# Patient Record
Sex: Male | Born: 1950 | Race: Black or African American | Hispanic: No | Marital: Single | Smoking: Current some day smoker
Health system: Southern US, Community
[De-identification: ages and names within clinical notes are randomized; demographics above are authoritative.]

## PROBLEM LIST (undated history)

## (undated) DIAGNOSIS — E785 Hyperlipidemia, unspecified: Secondary | ICD-10-CM

## (undated) DIAGNOSIS — I10 Essential (primary) hypertension: Secondary | ICD-10-CM

## (undated) DIAGNOSIS — R519 Headache, unspecified: Secondary | ICD-10-CM

## (undated) DIAGNOSIS — R51 Headache: Secondary | ICD-10-CM

## (undated) DIAGNOSIS — F32A Depression, unspecified: Secondary | ICD-10-CM

## (undated) DIAGNOSIS — I2510 Atherosclerotic heart disease of native coronary artery without angina pectoris: Secondary | ICD-10-CM

## (undated) DIAGNOSIS — I639 Cerebral infarction, unspecified: Secondary | ICD-10-CM

## (undated) DIAGNOSIS — L89159 Pressure ulcer of sacral region, unspecified stage: Secondary | ICD-10-CM

## (undated) DIAGNOSIS — I739 Peripheral vascular disease, unspecified: Secondary | ICD-10-CM

## (undated) DIAGNOSIS — I219 Acute myocardial infarction, unspecified: Secondary | ICD-10-CM

## (undated) DIAGNOSIS — F39 Unspecified mood [affective] disorder: Secondary | ICD-10-CM

## (undated) DIAGNOSIS — G709 Myoneural disorder, unspecified: Secondary | ICD-10-CM

## (undated) DIAGNOSIS — J45909 Unspecified asthma, uncomplicated: Secondary | ICD-10-CM

## (undated) DIAGNOSIS — E119 Type 2 diabetes mellitus without complications: Secondary | ICD-10-CM

## (undated) DIAGNOSIS — F419 Anxiety disorder, unspecified: Secondary | ICD-10-CM

## (undated) DIAGNOSIS — M1990 Unspecified osteoarthritis, unspecified site: Secondary | ICD-10-CM

## (undated) DIAGNOSIS — E46 Unspecified protein-calorie malnutrition: Secondary | ICD-10-CM

## (undated) DIAGNOSIS — F329 Major depressive disorder, single episode, unspecified: Secondary | ICD-10-CM

## (undated) HISTORY — PX: CARDIAC CATHETERIZATION: SHX172

## (undated) HISTORY — PX: BACK SURGERY: SHX140

## (undated) HISTORY — PX: CARDIAC SURGERY: SHX584

---

## 1999-06-21 ENCOUNTER — Emergency Department (HOSPITAL_COMMUNITY): Admission: EM | Admit: 1999-06-21 | Discharge: 1999-06-21 | Payer: Self-pay | Admitting: Emergency Medicine

## 2001-02-05 ENCOUNTER — Emergency Department (HOSPITAL_COMMUNITY): Admission: EM | Admit: 2001-02-05 | Discharge: 2001-02-06 | Payer: Self-pay

## 2001-04-12 ENCOUNTER — Emergency Department (HOSPITAL_COMMUNITY): Admission: EM | Admit: 2001-04-12 | Discharge: 2001-04-12 | Payer: Self-pay | Admitting: Emergency Medicine

## 2001-04-12 ENCOUNTER — Encounter: Payer: Self-pay | Admitting: Emergency Medicine

## 2002-06-08 ENCOUNTER — Encounter: Payer: Self-pay | Admitting: Emergency Medicine

## 2002-06-09 ENCOUNTER — Encounter: Payer: Self-pay | Admitting: Emergency Medicine

## 2002-06-09 ENCOUNTER — Inpatient Hospital Stay (HOSPITAL_COMMUNITY): Admission: EM | Admit: 2002-06-09 | Discharge: 2002-06-13 | Payer: Self-pay | Admitting: Emergency Medicine

## 2002-06-10 ENCOUNTER — Encounter: Payer: Self-pay | Admitting: Internal Medicine

## 2002-08-22 ENCOUNTER — Emergency Department (HOSPITAL_COMMUNITY): Admission: EM | Admit: 2002-08-22 | Discharge: 2002-08-22 | Payer: Self-pay | Admitting: Emergency Medicine

## 2002-09-20 ENCOUNTER — Encounter: Payer: Self-pay | Admitting: Emergency Medicine

## 2002-09-20 ENCOUNTER — Emergency Department (HOSPITAL_COMMUNITY): Admission: EM | Admit: 2002-09-20 | Discharge: 2002-09-20 | Payer: Self-pay | Admitting: Emergency Medicine

## 2002-09-25 ENCOUNTER — Emergency Department (HOSPITAL_COMMUNITY): Admission: EM | Admit: 2002-09-25 | Discharge: 2002-09-25 | Payer: Self-pay | Admitting: Emergency Medicine

## 2004-10-31 ENCOUNTER — Emergency Department (HOSPITAL_COMMUNITY): Admission: EM | Admit: 2004-10-31 | Discharge: 2004-10-31 | Payer: Self-pay | Admitting: Emergency Medicine

## 2005-03-14 ENCOUNTER — Inpatient Hospital Stay (HOSPITAL_COMMUNITY): Admission: AD | Admit: 2005-03-14 | Discharge: 2005-03-17 | Payer: Self-pay | Admitting: Psychiatry

## 2005-03-14 ENCOUNTER — Ambulatory Visit: Payer: Self-pay | Admitting: Psychiatry

## 2005-04-01 ENCOUNTER — Emergency Department (HOSPITAL_COMMUNITY): Admission: EM | Admit: 2005-04-01 | Discharge: 2005-04-01 | Payer: Self-pay | Admitting: Emergency Medicine

## 2006-04-28 ENCOUNTER — Emergency Department (HOSPITAL_COMMUNITY): Admission: EM | Admit: 2006-04-28 | Discharge: 2006-04-28 | Payer: Self-pay | Admitting: Emergency Medicine

## 2008-10-07 ENCOUNTER — Emergency Department (HOSPITAL_COMMUNITY): Admission: EM | Admit: 2008-10-07 | Discharge: 2008-10-07 | Payer: Self-pay | Admitting: Emergency Medicine

## 2008-10-10 ENCOUNTER — Emergency Department (HOSPITAL_COMMUNITY): Admission: EM | Admit: 2008-10-10 | Discharge: 2008-10-10 | Payer: Self-pay | Admitting: Emergency Medicine

## 2008-10-14 ENCOUNTER — Emergency Department (HOSPITAL_COMMUNITY): Admission: EM | Admit: 2008-10-14 | Discharge: 2008-10-14 | Payer: Self-pay | Admitting: Emergency Medicine

## 2008-10-16 ENCOUNTER — Ambulatory Visit: Payer: Self-pay | Admitting: Sports Medicine

## 2008-10-16 DIAGNOSIS — M549 Dorsalgia, unspecified: Secondary | ICD-10-CM | POA: Insufficient documentation

## 2008-10-16 DIAGNOSIS — M542 Cervicalgia: Secondary | ICD-10-CM | POA: Insufficient documentation

## 2008-10-18 ENCOUNTER — Emergency Department (HOSPITAL_COMMUNITY): Admission: EM | Admit: 2008-10-18 | Discharge: 2008-10-18 | Payer: Self-pay | Admitting: Emergency Medicine

## 2008-10-26 ENCOUNTER — Emergency Department (HOSPITAL_COMMUNITY): Admission: EM | Admit: 2008-10-26 | Discharge: 2008-10-26 | Payer: Self-pay | Admitting: Emergency Medicine

## 2009-02-14 ENCOUNTER — Emergency Department (HOSPITAL_COMMUNITY): Admission: EM | Admit: 2009-02-14 | Discharge: 2009-02-14 | Payer: Self-pay | Admitting: Family Medicine

## 2009-04-03 ENCOUNTER — Encounter: Admission: RE | Admit: 2009-04-03 | Discharge: 2009-05-04 | Payer: Self-pay | Admitting: Family Medicine

## 2009-05-28 ENCOUNTER — Encounter: Admission: RE | Admit: 2009-05-28 | Discharge: 2009-05-28 | Payer: Self-pay | Admitting: Family Medicine

## 2009-06-11 ENCOUNTER — Encounter: Admission: RE | Admit: 2009-06-11 | Discharge: 2009-06-11 | Payer: Self-pay | Admitting: Family Medicine

## 2009-06-26 ENCOUNTER — Encounter: Admission: RE | Admit: 2009-06-26 | Discharge: 2009-06-26 | Payer: Self-pay | Admitting: Family Medicine

## 2010-11-28 LAB — GLUCOSE, CAPILLARY: Glucose-Capillary: 268 mg/dL — ABNORMAL HIGH (ref 70–99)

## 2011-01-03 NOTE — Consult Note (Signed)
NAME:  Jordan Gardner, Jordan Gardner                       ACCOUNT NO.:  [id number]   MEDICAL RECORD NO.:  [id number]                   PATIENT TYPE:  INP   LOCATION:  5731                                 FACILITY:  [facility]   PHYSICIAN:  Bernette Redbird, MD                  DATE OF BIRTH:  [birth year]   DATE OF CONSULTATION:  06/09/2002  DATE OF DISCHARGE:                                   CONSULTATION   GASTROENTEROLOGY CONSULTATION:   REASON FOR CONSULTATION:  Dr. Trula Slade of the [facility]  asked me to see this unassigned patient (followed primarily by Dr.  Luberta Robertson at the [facility]).   The patient himself has problems with schizophrenia but his history seems to  be quite reliable and additional history is obtained from reviewing the  chart.   The patient states that he came in yesterday due to a high blood sugar, but  it is evident that he has been having upper abdominal pain for approximately  the past week, associated with food intolerance and periodic postprandial  vomiting.   Although his amylase and lipase were normal on presentation, a CT of the  abdomen showed phlegmonous pancreatitis.  I have reviewed the film in detail  with the radiologist and we see no evidence of pancreatitis mass, chronic  calcific pancreatic changes, gallstones, or tumor.  The patient has no  alcohol history but does have a history of hyperlipidemia (specifically  hypercholesterolemia), diabetes, and exposure to thiazide diuretic  medication as possible etiologies for the pancreatitis.   Since being admitted, he feels somewhat better but is still having some  degree of abdominal pain.   PAST MEDICAL HISTORY:  1. Allergies: No known allergies.  2. Outpatient medications are numerous and include a large number of     psychotropic medications.  His medications include Haldol, enteric-coated     aspirin, Depakote, Tegretol, vitamin E, Risperdal, metformin, trazodone,     Zoloft,  propranolol, Aciphex, Cogentin, gemfibrozil, simvastatin and     Abilify (new antipsychotic medication).  3. Operations: None other than back surgery.  4. Chronic medical illnesses: Diabetes, hypercholesterolemia, hypertension,     schizophrenia.   HABITS:  The patient does smoke but states he is a nondrinker.   FAMILY HISTORY:  Family history apparently positive for coronary disease.   SOCIAL HISTORY:  The patient states he lives by himself and is followed at  [facility].  He is on disability.  He was in the [employer] during  the [place] Era.   REVIEW OF SYSTEMS:  The patient has never previously had abdominal pain like  this.  He has had some degree of constipation during this illness but at  baseline it does not sound as though he has any specific ongoing GI  complaint such as reflux symptoms, indigestion, nausea, stomach pain,  constipation, diarrhea, or rectal bleeding.   FAMILY HISTORY:  Family history  is negative for GI tract illnesses except  possibly a gallbladder problem in his mother, no colon cancer, colitis,  liver disease or ulcers.   PHYSICAL EXAMINATION:  GENERAL: The patient is a stout healthy-appearing  African-American male in no evident distress at the time of my evaluation.  VITAL SIGNS: Temperature 97.0, blood pressure 110/70, pulse 85.  HEENT: He is anicteric, there is some slight conjunctival injection but no  frank skin pallor.  CHEST: Clear.  HEART: Unremarkable.  ABDOMEN: Sparse bowel sounds and some degree of epigastric tenderness but  this is not impressive and certainly not exquisite tenderness.  No mass  effect is appreciated.  RECTAL: Exam is not performed.   LABORATORIES:  Impressive for normalcy of pancreatic enzyme levels.  White  count is elevated at 20,000, hemoglobin 13.5 following hydration, platelets  183,000, differential count on admission showed 90 polys.  Sodium 130,  potassium 3.4, BUN 8, creatinine 0.9, bilirubin is 1.3,  alk phos is 101  which is in the normal range.  SGOT 24 and SGPT 12, both well within normal.  Prehydration albumin was 3.5.  Blood was negative for acetone.  Amylase was  44 on admission and again was 44 after being rechecked overnight.  Hemoglobin A1C is markedly elevated at 13.0.  Lipase is 27 both last night  and again this morning.  CK-MB's and troponin I's are normal.  TSH is  normal.   CT scan of the abdomen extensively reviewed with radiologist, see above  discussion.   DISCUSSION AND PLAN:  I believe this patient truly does have resolving  pancreatitis.  The reason the pancreatic enzymes are currently normal would  presumably be that the onset of the pancreatitis was probably about a week  ago and we are seeing it at the tail end.  It appears to be clinically not  particularly severe in terms of Ransom criteria, for example, so far no  fever, no tachycardia, no azotemia.  Possible etiologies for the  pancreatitis in this patient would include hypertriglyceridemia, thiazide  diuretic exposure, or less likely pancreas divisum (it would seem unusual to  present at age 60 with that as the cause of pancreatitis), or even an unseen  pancreatic neoplasm.  Certainly gallstones have to be considered despite the  absence of gallstones on the patient's CT scan.   RECOMMENDATIONS:  1. Continue supportive care as you are already doing including bowel rest,     IV fluids, pain medications and laboratory monitoring.  2. Check triglyceride level to see if there extreme elevation which might     place the patient at risk for hypertriglyceridemic pancreatitis.  3. Obtain an ultrasound to rule out gallstones which can be missed on CT     scan and would be a treatable cause of pancreatitis.  4. The patient should probably have followup CT scanning in a month or so to     look for the development of a pseudocyst and to help confirm the absence    of any neoplasm or mass in the pancreatic  head.                                               Bernette Redbird, MD    RB/MEDQ  D:  06/09/2002  T:  06/10/2002  Job:  [id number]

## 2011-01-03 NOTE — Discharge Summary (Signed)
NAME:  Jordan Gardner, Jordan Gardner           ACCOUNT NO.:  [id number]   MEDICAL RECORD NO.:  [id number]          PATIENT TYPE:  IPS   LOCATION:  0508                          [facility]ACILITY:  [facility]   PHYSICIAN:  Jeanice Lim, M.D. DATE OF BIRTH:  December 16, 1950   DATE OF ADMISSION:  03/14/2005  DATE OF DISCHARGE:  03/17/2005                                 DISCHARGE SUMMARY   IDENTIFYING DATA:  This is a 60 year old single African-American male with  history of depression and suicidal thoughts and hallucinations.  History of  substance abuse.  Depressed.  Voice telling him to jump in front of a car.  He stated he talked to a psychiatrist, angry over bills, hearing voices  telling him to jump in front of a bus and drinking beer and smoking crack,  but not every day.  Was not going to hurt himself.  Hears voices all the  time.  Is not sure why he was committed.  Despite report of his symptoms,  seemed to appear to make him dangerous at the time of the report.  First  [facility] admission.  Followed by Dr. Allyne Gee.  History of  bipolar disorder versus schizoaffective disorder.   MEDICATIONS:  Naprosyn, metformin, Depakote, omeprazole, Cogentin, Risperdal  and trazodone.   ALLERGIES:  No known drug allergies.   PHYSICAL EXAMINATION:  Physical and neurologic exam within normal limits.   MENTAL STATUS EXAM:  In bed, calm, sleepy.  Poor eye contact.  Speech was  clear, abrupt.  Mood with some irritability.  Thought processes mostly goal  directed, endorsing auditory hallucinations with suicidal ideation, again  auditory hallucinations, chronic, and no acute intent.  Denying any  dangerousness.  Feeling he was safe.  Cognitively intact.  Judgment and  insight were somewhat impaired.  The patient appeared to minimize and  conflict himself regarding his safety.   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  Polysubstance abuse.  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  AXIS II:  Deferred.  AXIS III:  Hypertension, gastroesophageal reflux disease, non-insulin-  dependent diabetes mellitus.  AXIS IV:  Moderate (problems with other psychosocial issues, medical  problems).  AXIS V:  35/60.   HOSPITAL COURSE:  The patient was admitted and ordered routine p.r.n.  medications, stabilized on medications, monitored for safety.  Encouraged to  work on Pharmacologist.  Participated in group and aftercare planning and was  monitored for detox.   CONDITION ON DISCHARGE:  The patient was discharged in improved condition  with no dangerous ideation, no withdrawal symptoms, reporting motivation to  remain abstinent.  Aware of the impact of this on his mood and safety as  well as compliant with his medications and would be compliant with follow-up  appointments.  Aware that he probably needed to come in the hospital due to  what he told the psychiatrist and the psychiatrist was trying to make  recommendation based on his best interest and safety.  The patient, again,  was given medication education.   DISCHARGE MEDICATIONS:  1.  Depakote 500 mg q.a.m. and 2 at 9  p.m.  2.  Trazodone 150 mg at 8 p.m.  3.  Risperdal 1 mg q.a.m. and 2 at 8 p.m.  4.  Cogentin 1 mg bid  5.  Haldol 10 mg, 1/2 q.a.m. and 1 at 8 p.m.  6.  Glucophage 500 mg, 2 b.i.d.  7.  Aspirin 325 mg q.a.m.  8.  Protonix 40 mg q.a.m.  9.  Lunesta 3 mg q.h.s. p.r.n.   FOLLOW UP:  The patient is to follow up with Dr. Lang Snow at [facility].   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  Polysubstance abuse.  Possible substance-induced  mood disorder.  AXIS II:  Deferred.  AXIS III:  Hypertension, gastroesophageal reflux disease, non-insulin-  dependent diabetes mellitus.  AXIS IV:  Moderate (problems with other psychosocial issues, medical  problems).  AXIS V:  GAF on discharge  55.      Jeanice Lim, M.D.  Electronically Signed     JEM/MEDQ  D:  04/23/2005  T:  04/24/2005  Job:  [id number]

## 2011-01-03 NOTE — Discharge Summary (Signed)
NAME:  Jordan Gardner, Jordan Gardner                       ACCOUNT NO.:  [id number]   MEDICAL RECORD NO.:  [id number]                   PATIENT TYPE:  INP   LOCATION:  5731                                 FACILITY:  [facility]   PHYSICIAN:  Deirdre Peer. Polite, M.D.              DATE OF BIRTH:  May 26, 1951   DATE OF ADMISSION:  06/08/2002  DATE OF DISCHARGE:  06/13/2002                                 DISCHARGE SUMMARY   DISCHARGE DIAGNOSES:  1. Pancreatitis, improved.  2. Uncontrolled diabetes mellitus, Hemoglobin A1C 13.1.  3. Hyperlipidemia with elevated triglycerides.  4. Hypertension.  5. Schizophrenia.   DISCHARGE MEDICATIONS:  1. Haldol 2 mg 1 daily and 2 tablets at bedtime.  2. Enteric-coated 325 mg daily.  3. Depakote 500 mg 2 tablets twice a day.  4. Tegretol 200 mg 1 tablet 3 times a day.  5. Vitamin E 400 IU 1 capsule daily.  6. Risperdal 3 mg 1 twice daily.  7. Metformin 1000 mg 1 twice daily.  8. Trazodone 100 mg 1 at bedtime.  9. Zoloft 100 mg 2 tablets daily.  10.      Propanolol 10 mg 2 tablets twice daily.  11.      Aciphex 20 mg 1 twice daily.  12.      Cogentin 1 tablet twice a day.  13.      Pancreas 2 capsules with meals and snacks.  14.      Norvasc 5 mg daily.  15.      Gemfibrozil 1 tablet twice daily.  16.      Simvastatin 80 mg 1/2 tablet daily.  17.      Ability 15 mg 1/2 tablet twice a day.  18.      Phenergan 25 mg 1 tablet every 6 hours as needed for nausea or     vomiting.  19.      Percocet 5/325 one to two tablets every 4 hours as needed for pain.   PROCEDURES:  1. EKG 06/09/2002:  Normal sinus rhythm, ventricular rate 98.  2. Ultrasound of abdomen 06/10/2002 revealed mild fatty infiltration of the     liver, minimal prominence of the pancreas without evidence of abscess,     pseudocyst, or free fluid seen in the abdomen.  3. CT of abdomen an pelvis 06/09/2002 revealed acute pancreatitis with     phlegmon.  No pseudocyst or abscesses currently noted and no  pancreatic     necrosis seen.  Bibasilar linear atelectasis.  Pelvic portion was normal.  4. Chest x-ray 06/08/2002 revealed no active lung disease, mild basilar     linear atelectasis or scarring.  No bowel obstruction or free fluid     noted.   LABORATORY DATA:  Discharge CBC showed WBC 11.5,  RBC 4.41, hemoglobin 12.1,  hematocrit 37.2, MCV 84.1, platelets 145.  Sodium 135, potassium 3.7,  chloride 101, CO2 27, glucose 114, BUN 10, creatinine 1.1,  albumin 2.4, AST  13, ALT 9, alkaline phosphatase 66, total bilirubin 1.2, amylase 44 x 2,  lipase 27 x 2, serum acetone negative.  Hemoglobin A1C was 13.1.  CK 98 and  107.  CK-MB 1.1 and 1.3.  Troponin 0.02 and less than 0.01.  Total  cholesterol 310, triglycerides 582, HDL 42, LDL not calculated due to high  triglycerides.  TSH 2.933.  Tegretol level 2.3.  Valproic acid less than 10.  Urinalysis negative.   CONSULTATIONS:  Bernette Redbird, MD, gastroenterology.   HISTORY OF PRESENT ILLNESS:  This is a 60 year old male who presented to  [facility] on 06/08/2002 with a three to four-day history of nausea,  vomiting, and subjective fevers.  The patient is a known diabetic and  reported that he was unable to keep anything down.  Initial evaluation  revealed normal amylase and lipase with an elevated white blood cell count  of 19.0.  The patient was admitted for further evaluation and treatment.   HOSPITAL COURSE:  1. NAUSEA AND VOMITING SECONDARY TO PANCREATITIS:  Initially the patient was     admitted and hydrated with IV fluids.  Again, admission amylase and     lipase were normal.  Nausea and vomiting were initially felt maybe     secondary to gastritis or gastroparesis.  A GI consult was obtained by     Dr. Bernette Redbird.  A CT of the abdomen did reveal fatty infiltration of     the liver and fluid and inflammatory changes throughout the pancreas     consistent with acute pancreatitis. Possible cause was diabetes,      hypercholesterolemia, and diuretic which was subsequently held.  He was     continued on bowel rest along with IV fluids, sliding scale insulin to     cover CBGs.  The patient remained afebrile.  He did have a significant     amount of right upper quadrant pain. His diet was advanced.  His right     upper quadrant pain was initially managed with IV analgesics which were     then switched to p.o.  At discharge, the patient is tolerating a low-fat     diet with Pancrease tablets before meals along with p.r.n. analgesics.     He will be discharged on additional two weeks supply of Pancrease.     Ultrasound of the abdomen on 06/10/2002 was without any evidence of     gallstones.   1. LEUKOCYTOSIS:  The patient had an elevated white blood cell count on     admission of 19.  He was afebrile.  There were no signs or symptoms of     acute infection.  His white blood cell count did come down to nearly     normal.  The elevation was thought to be secondary to demargination.   1. UNCONTROLLED DIABETES MELLITUS:  The patient is a known diabetic and is     on Metformin at 1000 mg twice a day at home.  Due to his n.p.o. status,     CBGs were started along with sliding scale insulin.  His CBGs ranged in     the 100 to 300 range.  Hemoglobin A1C did reveal poor overall control of     diabetes at 13.1.  He is being discharged on his home regimen of     Metformin with further titration to be left with primary care physician.   1. HYPERLIPIDEMIA:  The patient has known history  of hyperlipidemia.  Lipid     panel was done which revealed total cholesterol 310 and triglycerides of     582, HDL 42.  The patient is being discharged on his home medications of     Gemfibrozil and Simvastatin with titration to be left to his primary care     physician at the [facility].   1. HYPERTENSION:  Again, one of the possible aggravating factors of his    episode of pancreatitis was thought to possibly  related to Diazide     diuretic. This was initially held.  His blood pressure was essentially     controlled until the day of discharge at which time his blood pressure     was noted to be 177/99.  Ideally an ACE inhibitor would be appropriate in     a diabetic patient, but due to questionable followup, this will not be     started.  Norvasc 5 mg daily will be started with further titration by     his primary care physician.   1. SCHIZOPHRENIA:  The patient is a known schizophrenic and on multiple     antipsychotic medications.  These medications were continued.  The     patient displayed no acute episodes of schizophrenia.  He is being     discharged on his home medications.   FOLLOW UP:  Dr. Arthur Holms office at the [facility] was contacted.  A  message was left along with the patient's phone number for need of followup  appointment.  A copy of this Discharge Summary will be also faxed to Dr.  Luberta Robertson at [phone].     Stephanie [place], NP                      Deirdre Peer. Polite, M.D.    SJ/MEDQ  D:  06/13/2002  T:  06/13/2002  Job:  [id number]   cc:   Bernette Redbird, MD  [address], [address]  [place], [place] [address]  Fax: [phone]   Dr. Luberta Robertson [facility] [phone])  [place]

## 2011-05-20 ENCOUNTER — Ambulatory Visit (HOSPITAL_COMMUNITY)
Admission: RE | Admit: 2011-05-20 | Discharge: 2011-05-20 | Disposition: A | Discharge status: Home / Self Care | Payer: Non-veteran care | Source: Ambulatory Visit | Attending: Surgery | Admitting: Surgery

## 2011-05-20 DIAGNOSIS — Z48812 Encounter for surgical aftercare following surgery on the circulatory system: Secondary | ICD-10-CM

## 2011-05-20 DIAGNOSIS — I658 Occlusion and stenosis of other precerebral arteries: Secondary | ICD-10-CM | POA: Insufficient documentation

## 2015-01-20 ENCOUNTER — Encounter (HOSPITAL_COMMUNITY): Payer: Self-pay | Admitting: Emergency Medicine

## 2015-01-20 ENCOUNTER — Emergency Department (HOSPITAL_COMMUNITY)
Admission: EM | Admit: 2015-01-20 | Discharge: 2015-01-21 | Disposition: A | Discharge status: Home / Self Care | Payer: Medicare HMO | Attending: Emergency Medicine | Admitting: Emergency Medicine

## 2015-01-20 DIAGNOSIS — L02413 Cutaneous abscess of right upper limb: Secondary | ICD-10-CM | POA: Insufficient documentation

## 2015-01-20 DIAGNOSIS — R739 Hyperglycemia, unspecified: Secondary | ICD-10-CM

## 2015-01-20 DIAGNOSIS — E860 Dehydration: Secondary | ICD-10-CM | POA: Insufficient documentation

## 2015-01-20 DIAGNOSIS — S29012A Strain of muscle and tendon of back wall of thorax, initial encounter: Secondary | ICD-10-CM | POA: Insufficient documentation

## 2015-01-20 DIAGNOSIS — Y998 Other external cause status: Secondary | ICD-10-CM | POA: Insufficient documentation

## 2015-01-20 DIAGNOSIS — S161XXA Strain of muscle, fascia and tendon at neck level, initial encounter: Secondary | ICD-10-CM | POA: Insufficient documentation

## 2015-01-20 DIAGNOSIS — E1165 Type 2 diabetes mellitus with hyperglycemia: Secondary | ICD-10-CM | POA: Insufficient documentation

## 2015-01-20 DIAGNOSIS — S39012A Strain of muscle, fascia and tendon of lower back, initial encounter: Secondary | ICD-10-CM

## 2015-01-20 DIAGNOSIS — Y92192 Bathroom in other specified residential institution as the place of occurrence of the external cause: Secondary | ICD-10-CM | POA: Insufficient documentation

## 2015-01-20 DIAGNOSIS — W1839XA Other fall on same level, initial encounter: Secondary | ICD-10-CM | POA: Insufficient documentation

## 2015-01-20 DIAGNOSIS — R42 Dizziness and giddiness: Secondary | ICD-10-CM | POA: Diagnosis present

## 2015-01-20 DIAGNOSIS — Y9389 Activity, other specified: Secondary | ICD-10-CM | POA: Diagnosis not present

## 2015-01-20 DIAGNOSIS — R52 Pain, unspecified: Secondary | ICD-10-CM
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
## 2015-01-21 ENCOUNTER — Emergency Department (HOSPITAL_COMMUNITY): Payer: Medicare HMO

## 2015-01-21 LAB — CBG MONITORING, ED
GLUCOSE-CAPILLARY: 334 mg/dL — AB (ref 65–99)
Glucose-Capillary: 293 mg/dL — ABNORMAL HIGH (ref 65–99)

## 2015-01-21 LAB — BASIC METABOLIC PANEL
ANION GAP: 10 (ref 5–15)
BUN: 13 mg/dL (ref 6–20)
CALCIUM: 8.1 mg/dL — AB (ref 8.9–10.3)
CO2: 19 mmol/L — AB (ref 22–32)
Chloride: 101 mmol/L (ref 101–111)
Creatinine, Ser: 1 mg/dL (ref 0.61–1.24)
GFR calc Af Amer: >60 mL/min (ref >60)
GLUCOSE: 349 mg/dL — AB (ref 65–99)
POTASSIUM: 4.4 mmol/L (ref 3.5–5.1)
SODIUM: 130 mmol/L — AB (ref 135–145)

## 2015-01-21 LAB — CBC WITH DIFFERENTIAL/PLATELET
BASOS ABS: 0 10*3/uL (ref 0.0–0.1)
BASOS PCT: 0 % (ref 0–1)
EOS ABS: 0.3 10*3/uL (ref 0.0–0.7)
Eosinophils Relative: 2 % (ref 0–5)
HEMATOCRIT: 40 % (ref 39.0–52.0)
HEMOGLOBIN: 13.5 g/dL (ref 13.0–17.0)
LYMPHS ABS: 1.6 10*3/uL (ref 0.7–4.0)
Lymphocytes Relative: 11 % — ABNORMAL LOW (ref 12–46)
MCH: 26.9 pg (ref 26.0–34.0)
MCHC: 33.8 g/dL (ref 30.0–36.0)
MCV: 79.8 fL (ref 78.0–100.0)
MONO ABS: 1.2 10*3/uL — AB (ref 0.1–1.0)
MONOS PCT: 8 % (ref 3–12)
NEUTROS ABS: 11.6 10*3/uL — AB (ref 1.7–7.7)
Neutrophils Relative %: 79 % — ABNORMAL HIGH (ref 43–77)
Platelets: 175 10*3/uL (ref 150–400)
RBC: 5.01 MIL/uL (ref 4.22–5.81)
RDW: 13.1 % (ref 11.5–15.5)
WBC: 14.7 10*3/uL — AB (ref 4.0–10.5)

## 2015-01-21 LAB — ETHANOL: Alcohol, Ethyl (B): <5 mg/dL (ref <5)

## 2015-01-21 MED ORDER — SODIUM CHLORIDE 0.9 % IV BOLUS (SEPSIS)
1000.0000 mL | Freq: Once | INTRAVENOUS | Status: DC
Start: 2015-01-21 — End: 2015-01-21

## 2015-01-21 MED ORDER — SODIUM CHLORIDE 0.9 % IV SOLN
1000.0000 mL | Freq: Once | INTRAVENOUS | Status: AC
  Administered 2015-01-21: 1000 mL via INTRAVENOUS

## 2015-01-21 MED ORDER — SODIUM CHLORIDE 0.9 % IV BOLUS (SEPSIS)
1000.0000 mL | Freq: Once | INTRAVENOUS | Status: AC
  Administered 2015-01-21: 1000 mL via INTRAVENOUS

## 2015-01-21 MED ORDER — DOXYCYCLINE HYCLATE 100 MG PO TABS
100.0000 mg | ORAL_TABLET | Freq: Once | ORAL | Status: AC
  Administered 2015-01-21: 100 mg via ORAL
  Filled 2015-01-21: qty 1

## 2015-01-21 MED ORDER — OXYCODONE-ACETAMINOPHEN 5-325 MG PO TABS: 1.0000 | ORAL_TABLET | ORAL | Status: DC | PRN

## 2015-01-21 MED ORDER — LIDOCAINE-EPINEPHRINE (PF) 2 %-1:200000 IJ SOLN
1.7000 mL | Freq: Once | INTRAMUSCULAR | Status: AC
  Administered 2015-01-21: 1.7 mL
  Filled 2015-01-21: qty 20

## 2015-01-21 MED ORDER — INSULIN ASPART 100 UNIT/ML ~~LOC~~ SOLN
5.0000 [IU] | Freq: Once | SUBCUTANEOUS | Status: AC
  Administered 2015-01-21: 5 [IU] via SUBCUTANEOUS
  Filled 2015-01-21: qty 1

## 2015-01-21 MED ORDER — MORPHINE SULFATE 4 MG/ML IJ SOLN
4.0000 mg | Freq: Once | INTRAMUSCULAR | Status: AC
  Administered 2015-01-21: 4 mg via INTRAVENOUS
  Filled 2015-01-21: qty 1

## 2015-01-21 MED ORDER — DOXYCYCLINE HYCLATE 100 MG PO CAPS: 100.0000 mg | ORAL_CAPSULE | Freq: Two times a day (BID) | ORAL | Status: DC

## 2015-01-21 NOTE — ED Notes (Signed)
Iv intake 1000 not 10000 as documented by accident.

## 2015-01-21 NOTE — ED Notes (Signed)
PA student at bedside for I&D of R shoulder

## 2015-01-21 NOTE — ED Notes (Signed)
Pt c/o cellulitis to R shoulder. Pt "popped a bump" on Tuesday and has increased redness, swelling, and mild drainage from R shoulder. C/o increased pain on movement; ROM intact. Pt denies dizziness/weakness at this time.

## 2015-01-21 NOTE — Discharge Instructions (Signed)

## 2015-01-21 NOTE — ED Notes (Signed)
CBG 332 

## 2015-01-21 NOTE — ED Notes (Signed)
Attempted to ambulate pt in hall.  Pt took a few steps with walker and became dizzy.  Pt returned to bed.    This RN asked pt how long he has had dizziness when upright.  Pt sts "It happens when I don't take my insulin".  This RN asked when the last time he checked his sugar was and pt stated "oh, about a week ago".  Pt sts his sister cooks his meals at home (whom he lives with) and she has been feeding him around 12pm.  Pt prefers to eat earlier.  When asked why she doesn't feed him earlier pt sts "she's just set in her ways".   This RN did education with patient on purpose and need to check blood sugar, even if not eating on a regular basis.  This RN explained to pt about his CBG being elevated.  MD made aware of discussion.

## 2015-02-11 ENCOUNTER — Encounter (HOSPITAL_COMMUNITY): Payer: Self-pay | Admitting: Emergency Medicine

## 2015-02-11 ENCOUNTER — Emergency Department (HOSPITAL_COMMUNITY)
Admission: EM | Admit: 2015-02-11 | Discharge: 2015-02-11 | Disposition: A | Discharge status: Home / Self Care | Payer: Non-veteran care | Attending: Emergency Medicine | Admitting: Emergency Medicine

## 2015-02-11 DIAGNOSIS — M25561 Pain in right knee: Secondary | ICD-10-CM

## 2015-02-11 DIAGNOSIS — Y929 Unspecified place or not applicable: Secondary | ICD-10-CM | POA: Diagnosis not present

## 2015-02-11 DIAGNOSIS — Z720 Tobacco use: Secondary | ICD-10-CM | POA: Insufficient documentation

## 2015-02-11 DIAGNOSIS — S4991XA Unspecified injury of right shoulder and upper arm, initial encounter: Secondary | ICD-10-CM | POA: Diagnosis not present

## 2015-02-11 DIAGNOSIS — S8991XA Unspecified injury of right lower leg, initial encounter: Secondary | ICD-10-CM | POA: Insufficient documentation

## 2015-02-11 DIAGNOSIS — M25562 Pain in left knee: Secondary | ICD-10-CM

## 2015-02-11 DIAGNOSIS — Y999 Unspecified external cause status: Secondary | ICD-10-CM | POA: Insufficient documentation

## 2015-02-11 DIAGNOSIS — S8992XA Unspecified injury of left lower leg, initial encounter: Secondary | ICD-10-CM | POA: Insufficient documentation

## 2015-02-11 DIAGNOSIS — Y939 Activity, unspecified: Secondary | ICD-10-CM | POA: Diagnosis not present

## 2015-02-11 DIAGNOSIS — Z792 Long term (current) use of antibiotics: Secondary | ICD-10-CM | POA: Insufficient documentation

## 2015-02-11 DIAGNOSIS — Z87828 Personal history of other (healed) physical injury and trauma: Secondary | ICD-10-CM | POA: Insufficient documentation

## 2015-02-11 DIAGNOSIS — W1830XA Fall on same level, unspecified, initial encounter: Secondary | ICD-10-CM | POA: Diagnosis not present

## 2015-02-11 DIAGNOSIS — I10 Essential (primary) hypertension: Secondary | ICD-10-CM | POA: Insufficient documentation

## 2015-02-11 DIAGNOSIS — E119 Type 2 diabetes mellitus without complications: Secondary | ICD-10-CM | POA: Diagnosis not present

## 2015-02-11 HISTORY — DX: Type 2 diabetes mellitus without complications: E11.9

## 2015-02-11 HISTORY — DX: Essential (primary) hypertension: I10

## 2015-02-11 LAB — I-STAT CHEM 8, ED
BUN: 10 mg/dL (ref 6–20)
CREATININE: 1.1 mg/dL (ref 0.61–1.24)
Calcium, Ion: 1.18 mmol/L (ref 1.13–1.30)
Chloride: 103 mmol/L (ref 101–111)
Glucose, Bld: 244 mg/dL — ABNORMAL HIGH (ref 65–99)
HCT: 39 % (ref 39.0–52.0)
HEMOGLOBIN: 13.3 g/dL (ref 13.0–17.0)
POTASSIUM: 3.8 mmol/L (ref 3.5–5.1)
SODIUM: 138 mmol/L (ref 135–145)
TCO2: 24 mmol/L (ref 0–100)

## 2015-02-11 LAB — CBC
HEMATOCRIT: 37.9 % — AB (ref 39.0–52.0)
Hemoglobin: 12.8 g/dL — ABNORMAL LOW (ref 13.0–17.0)
MCH: 26.8 pg (ref 26.0–34.0)
MCHC: 33.8 g/dL (ref 30.0–36.0)
MCV: 79.3 fL (ref 78.0–100.0)
PLATELETS: 157 10*3/uL (ref 150–400)
RBC: 4.78 MIL/uL (ref 4.22–5.81)
RDW: 13.5 % (ref 11.5–15.5)
WBC: 6.8 10*3/uL (ref 4.0–10.5)

## 2015-02-11 LAB — BASIC METABOLIC PANEL
Anion gap: 9 (ref 5–15)
BUN: 10 mg/dL (ref 6–20)
CALCIUM: 8.7 mg/dL — AB (ref 8.9–10.3)
CO2: 25 mmol/L (ref 22–32)
Chloride: 102 mmol/L (ref 101–111)
Creatinine, Ser: 1.13 mg/dL (ref 0.61–1.24)
GFR calc Af Amer: >60 mL/min (ref >60)
GFR calc non Af Amer: >60 mL/min (ref >60)
Glucose, Bld: 253 mg/dL — ABNORMAL HIGH (ref 65–99)
Potassium: 3.9 mmol/L (ref 3.5–5.1)
SODIUM: 136 mmol/L (ref 135–145)

## 2015-02-11 LAB — CBG MONITORING, ED: Glucose-Capillary: 280 mg/dL — ABNORMAL HIGH (ref 65–99)

## 2015-02-11 MED ORDER — ACETAMINOPHEN 500 MG PO TABS
1000.0000 mg | ORAL_TABLET | Freq: Once | ORAL | Status: AC
  Administered 2015-02-11: 1000 mg via ORAL
  Filled 2015-02-11: qty 2

## 2015-02-11 NOTE — ED Provider Notes (Signed)
CSN: [id number]     Arrival date & time    History   This chart was scribed for Tomasita Crumble, MD by Freida Busman, ED Scribe. This patient was seen in room D31C/D31C and the patient's care was started 2:27 AM.    Chief Complaint  Patient presents with  . Near Syncope  . Abdominal Pain    The history is provided by the patient. No language interpreter was used.     HPI Comments:  Jordan Gardner is a 64 y.o. male brought in by ambulance, who presents to the Emergency Department complaining of constant knee pain s/p fall ~1800. Pt states his knee gave way. He denies recent sickness, vomiting, diarrhea, cough, and fever. No alleviating factors noted. Pt is currently on antibiotic for wound to right shoulder and complaint with dose. He denies hitting his head or LOC.   Past Medical History  Diagnosis Date  . Diabetes mellitus without complication   . Hypertension    Past Surgical History  Procedure Laterality Date  . Cardiac surgery     No family history on file. History  Substance Use Topics  . Smoking status: Current Every Day Smoker -- 1.00 packs/day  . Smokeless tobacco: Not on file  . Alcohol Use: No    Review of Systems A complete 10 system review of systems was obtained and all systems are negative except as noted in the HPI and PMH.     Allergies  Review of patient's allergies indicates no known allergies.  Home Medications   Prior to Admission medications   Medication Sig Start Date End Date Taking? Authorizing Provider  doxycycline (VIBRAMYCIN) 100 MG capsule Take 1 capsule (100 mg total) by mouth 2 (two) times daily. One po bid x 7 days 01/21/15   Zadie Rhine, MD  oxyCODONE-acetaminophen (PERCOCET/ROXICET) 5-325 MG per tablet Take 1 tablet by mouth every 4 (four) hours as needed for severe pain. 01/21/15   Zadie Rhine, MD   BP 135/70 mmHg  Pulse 79  Temp(Src) 98 F (36.7 C) (Oral)  Resp 13  SpO2 96% Physical Exam  Constitutional: He is oriented to  person, place, and time. Vital signs are normal. He appears well-developed and well-nourished.  Non-toxic appearance. He does not appear ill. No distress.  HENT:  Head: Normocephalic and atraumatic.  Nose: Nose normal.  Mouth/Throat: Oropharynx is clear and moist. No oropharyngeal exudate.  Eyes: Conjunctivae and EOM are normal. Pupils are equal, round, and reactive to light. No scleral icterus.  Neck: Normal range of motion. Neck supple. No tracheal deviation, no edema, no erythema and normal range of motion present. No thyroid mass and no thyromegaly present.  Cardiovascular: Normal rate, regular rhythm, S1 normal, S2 normal, normal heart sounds, intact distal pulses and normal pulses.  Exam reveals no gallop and no friction rub.   No murmur heard. Pulses:      Radial pulses are 2+ on the right side, and 2+ on the left side.       Dorsalis pedis pulses are 2+ on the right side, and 2+ on the left side.  Pulmonary/Chest: Effort normal and breath sounds normal. No respiratory distress. He has no wheezes. He has no rhonchi. He has no rales.  Abdominal: Soft. Normal appearance and bowel sounds are normal. He exhibits no distension, no ascites and no mass. There is no hepatosplenomegaly. There is no tenderness. There is no rebound, no guarding and no CVA tenderness.  Musculoskeletal: Normal range of motion. He exhibits no edema  or tenderness.  Lymphadenopathy:    He has no cervical adenopathy.  Neurological: He is alert and oriented to person, place, and time. He has normal strength. No cranial nerve deficit or sensory deficit. He exhibits normal muscle tone.  Normal strength and sensation in all extremities.  Skin: Skin is warm, dry and intact. No petechiae and no rash noted. He is not diaphoretic. No erythema. No pallor.  4 cm circumfrential wound to right posterior shoulder; mild warmth no fluctuance.  Psychiatric: He has a normal mood and affect. His behavior is normal. Judgment normal.   Nursing note and vitals reviewed.   ED Course  Procedures   DIAGNOSTIC STUDIES:  Oxygen Saturation is 96% on RA, normal by my interpretation.    COORDINATION OF CARE:  2:29 AM Discussed treatment plan with pt at bedside and pt agreed to plan.  Labs Review Labs Reviewed  CBC - Abnormal; Notable for the following:    Hemoglobin 12.8 (*)    HCT 37.9 (*)    All other components within normal limits  BASIC METABOLIC PANEL - Abnormal; Notable for the following:    Glucose, Bld 253 (*)    Calcium 8.7 (*)    All other components within normal limits  CBG MONITORING, ED - Abnormal; Notable for the following:    Glucose-Capillary 280 (*)    All other components within normal limits  I-STAT CHEM 8, ED - Abnormal; Notable for the following:    Glucose, Bld 244 (*)    All other components within normal limits    Imaging Review No results found.   EKG Interpretation None      MDM   Final diagnoses:  None    Patient presents to the emergency department after his knees giving out and falling. He denies hitting his head or LOC. Physical exam is unremarkable for swelling, tenderness, or bony abnormality. Patient was given Tylenol for pain control. Laboratory studies do not show a cause for possible syncope. Patient otherwise appears well in no acute distress. Primary care follow-up was advised. His vital signs remain within his normal limits and he is safe for discharge.  I personally performed the services described in this documentation, which was scribed in my presence. The recorded information has been reviewed and is accurate.    Tomasita Crumble, MD 02/11/15 [phone]

## 2015-02-11 NOTE — Discharge Instructions (Signed)
Knee Pain Jordan Gardner, take Tylenol as needed for your knee pain. See a primary care physician within 3 days for close follow-up. If symptoms worsen come back to emergency department immediately. Thank you. Knee pain can be a result of an injury or other medical conditions. Treatment will depend on the cause of your pain. HOME CARE  Only take medicine as told by your doctor.  Keep a healthy weight. Being overweight can make the knee hurt more.  Stretch before exercising or playing sports.  If there is constant knee pain, change the way you exercise. Ask your doctor for advice.  Make sure shoes fit well. Choose the right shoe for the sport or activity.  Protect your knees. Wear kneepads if needed.  Rest when you are tired. GET HELP RIGHT AWAY IF:   Your knee pain does not stop.  Your knee pain does not get better.  Your knee joint feels hot to the touch.  You have a fever. MAKE SURE YOU:   Understand these instructions.  Will watch this condition.  Will get help right away if you are not doing well or get worse. Document Released: 10/31/2008 Document Revised: 10/27/2011 Document Reviewed: 10/31/2008 [name] Patient Information 2015 [place], [place]. This information is not intended to replace advice given to you by your health care provider. Make sure you discuss any questions you have with your health care provider.
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
## 2015-02-11 NOTE — ED Notes (Signed)
Phlebotomy at the bedside  

## 2015-02-11 NOTE — ED Notes (Signed)
Dr. Oni at the bedside.  

## 2015-02-11 NOTE — ED Notes (Signed)
Spoke to Dr. Mora Bellman in regards to patient status, sister at the bedside, concern for him taking too much oxycodone and she requests no pain medications. Explained none administered while in ER. Patient attempts to have stool, unsuccessful.  MD acknowledges. No new orders.

## 2016-12-23 ENCOUNTER — Emergency Department (HOSPITAL_COMMUNITY): Payer: Medicare HMO

## 2016-12-23 ENCOUNTER — Emergency Department (HOSPITAL_COMMUNITY)
Admission: EM | Admit: 2016-12-23 | Discharge: 2016-12-23 | Disposition: A | Discharge status: Home / Self Care | Payer: Medicare HMO | Attending: Emergency Medicine | Admitting: Emergency Medicine

## 2016-12-23 DIAGNOSIS — E119 Type 2 diabetes mellitus without complications: Secondary | ICD-10-CM | POA: Insufficient documentation

## 2016-12-23 DIAGNOSIS — Z79899 Other long term (current) drug therapy: Secondary | ICD-10-CM | POA: Diagnosis not present

## 2016-12-23 DIAGNOSIS — R5383 Other fatigue: Secondary | ICD-10-CM

## 2016-12-23 DIAGNOSIS — Z5181 Encounter for therapeutic drug level monitoring: Secondary | ICD-10-CM | POA: Diagnosis not present

## 2016-12-23 DIAGNOSIS — I10 Essential (primary) hypertension: Secondary | ICD-10-CM | POA: Insufficient documentation

## 2016-12-23 DIAGNOSIS — E860 Dehydration: Secondary | ICD-10-CM | POA: Diagnosis not present

## 2016-12-23 DIAGNOSIS — R4182 Altered mental status, unspecified: Secondary | ICD-10-CM | POA: Diagnosis not present

## 2016-12-23 DIAGNOSIS — F17200 Nicotine dependence, unspecified, uncomplicated: Secondary | ICD-10-CM | POA: Diagnosis not present

## 2016-12-23 LAB — CBC WITH DIFFERENTIAL/PLATELET
BASOS ABS: 0 10*3/uL (ref 0.0–0.1)
BASOS PCT: 0 %
Eosinophils Absolute: 0.2 10*3/uL (ref 0.0–0.7)
Eosinophils Relative: 3 %
HEMATOCRIT: 40.8 % (ref 39.0–52.0)
HEMOGLOBIN: 13.4 g/dL (ref 13.0–17.0)
Lymphocytes Relative: 18 %
Lymphs Abs: 1.2 10*3/uL (ref 0.7–4.0)
MCH: 27.4 pg (ref 26.0–34.0)
MCHC: 32.8 g/dL (ref 30.0–36.0)
MCV: 83.4 fL (ref 78.0–100.0)
MONO ABS: 0.4 10*3/uL (ref 0.1–1.0)
Monocytes Relative: 6 %
NEUTROS PCT: 73 %
Neutro Abs: 4.9 10*3/uL (ref 1.7–7.7)
Platelets: 163 10*3/uL (ref 150–400)
RBC: 4.89 MIL/uL (ref 4.22–5.81)
RDW: 13.7 % (ref 11.5–15.5)
WBC: 6.8 10*3/uL (ref 4.0–10.5)

## 2016-12-23 LAB — URINALYSIS, ROUTINE W REFLEX MICROSCOPIC
BACTERIA UA: NONE SEEN
Bilirubin Urine: NEGATIVE
Glucose, UA: NEGATIVE mg/dL
Ketones, ur: NEGATIVE mg/dL
Leukocytes, UA: NEGATIVE
Nitrite: NEGATIVE
PROTEIN: 30 mg/dL — AB
Specific Gravity, Urine: 1.014 (ref 1.005–1.030)
pH: 5 (ref 5.0–8.0)

## 2016-12-23 LAB — RAPID URINE DRUG SCREEN, HOSP PERFORMED
Amphetamines: NOT DETECTED
Barbiturates: NOT DETECTED
Benzodiazepines: NOT DETECTED
COCAINE: NOT DETECTED
OPIATES: NOT DETECTED
TETRAHYDROCANNABINOL: NOT DETECTED

## 2016-12-23 LAB — I-STAT TROPONIN, ED: TROPONIN I, POC: 0.01 ng/mL (ref 0.00–0.08)

## 2016-12-23 LAB — COMPREHENSIVE METABOLIC PANEL
ALK PHOS: 94 U/L (ref 38–126)
ALT: 8 U/L — AB (ref 17–63)
ANION GAP: 7 (ref 5–15)
AST: 12 U/L — ABNORMAL LOW (ref 15–41)
Albumin: 3.2 g/dL — ABNORMAL LOW (ref 3.5–5.0)
BILIRUBIN TOTAL: 0.7 mg/dL (ref 0.3–1.2)
BUN: 14 mg/dL (ref 6–20)
CO2: 25 mmol/L (ref 22–32)
Calcium: 8.6 mg/dL — ABNORMAL LOW (ref 8.9–10.3)
Chloride: 108 mmol/L (ref 101–111)
Creatinine, Ser: 1.34 mg/dL — ABNORMAL HIGH (ref 0.61–1.24)
GFR calc Af Amer: >60 mL/min (ref >60)
GFR calc non Af Amer: 54 mL/min — ABNORMAL LOW (ref >60)
Glucose, Bld: 227 mg/dL — ABNORMAL HIGH (ref 65–99)
Potassium: 4.3 mmol/L (ref 3.5–5.1)
Sodium: 140 mmol/L (ref 135–145)
TOTAL PROTEIN: 6.4 g/dL — AB (ref 6.5–8.1)

## 2016-12-23 LAB — ETHANOL

## 2016-12-23 LAB — PROTIME-INR
INR: 1.12
PROTHROMBIN TIME: 14.4 s (ref 11.4–15.2)

## 2016-12-23 LAB — I-STAT CG4 LACTIC ACID, ED: Lactic Acid, Venous: 1.72 mmol/L (ref 0.5–1.9)

## 2016-12-23 MED ORDER — SODIUM CHLORIDE 0.9 % IV SOLN: INTRAVENOUS | Status: DC

## 2016-12-23 MED ORDER — HYDROCODONE-ACETAMINOPHEN 5-325 MG PO TABS
1.0000 | ORAL_TABLET | Freq: Once | ORAL | Status: AC
  Administered 2016-12-23: 1 via ORAL
  Filled 2016-12-23: qty 1

## 2016-12-23 MED ORDER — SODIUM CHLORIDE 0.9 % IV BOLUS (SEPSIS)
1000.0000 mL | Freq: Once | INTRAVENOUS | Status: AC
  Administered 2016-12-23: 1000 mL via INTRAVENOUS

## 2016-12-23 NOTE — ED Notes (Signed)
Pt leaning to right and drooling. Pt responsive to questioning. EDP notified.

## 2016-12-23 NOTE — ED Provider Notes (Signed)
[facility]-EMERGENCY DEPT Provider Note   CSN: [id number] Arrival date & time: 12/23/16  1450     History   Chief Complaint Chief Complaint  Patient presents with  . Altered Mental Status    HPI Jordan Gardner is a 66 y.o. male.  HPI  Pt with hx prior CABG, DM, HTN presenting with c/o altered mental status.  Pt come from home via EMS where he lives with his sister. Home health noted today that the patient was less responsive than his baseline.  Per patient he states he fell 2 weeks ago and also 2 days ago.  He states he has felt his "head spinning" over the past 2 days.  Pt states he feels more dizzy when moving around and trying to walk- normally uses a walker.  Pt states he has been eating and drinking normally.  He denies striking his head.  He appears very tired and weak.  No fever, no vomiting or diarrhea.  There are no other associated systemic symptoms, there are no other alleviating or modifying factors.   Past Medical History:  Diagnosis Date  . Diabetes mellitus without complication   . Hypertension     Patient Active Problem List   Diagnosis Date Noted  . NECK PAIN 10/16/2008  . BACK PAIN 10/16/2008    Past Surgical History:  Procedure Laterality Date  . CARDIAC SURGERY         Home Medications    Prior to Admission medications   Medication Sig Start Date End Date Taking? Authorizing Provider  doxycycline (VIBRAMYCIN) 100 MG capsule Take 1 capsule (100 mg total) by mouth 2 (two) times daily. One po bid x 7 days Patient not taking: Reported on 12/23/2016 01/21/15   Zadie RhineWickline, Donald, MD  oxyCODONE-acetaminophen (PERCOCET/ROXICET) 5-325 MG per tablet Take 1 tablet by mouth every 4 (four) hours as needed for severe pain. Patient not taking: Reported on 12/23/2016 01/21/15   Zadie RhineWickline, Donald, MD    Family History No family history on file.  Social History Social History  Substance Use Topics  . Smoking status: Current Every Day Smoker    Packs/day: 1.00  .  Smokeless tobacco: Not on file  . Alcohol use No     Allergies   Patient has no known allergies.   Review of Systems Review of Systems  ROS reviewed and all otherwise negative except for mentioned in HPI   Physical Exam Updated Vital Signs BP (!) 163/66   Pulse (!) 54   Temp 97.7 F (36.5 C) (Oral)   Resp 12   SpO2 98%  Vitals reviewed Physical Exam Physical Examination: General appearance - alert, well appearing, and in no distress Mental status - alert, oriented to person, place, and time Eyes - PERRL, EOMI Mouth - mucous membranes moist, pharynx normal without lesions Neck - supple, no significant adenopathy Neurological - alert, oriented x3 , tired apperaing, answering all questions, strength 5/5 in extremities x 4, no cranial nerve defect Extremities - peripheral pulses normal, no pedal edema, no clubbing or cyanosis Skin - normal coloration and turgor, no rashes  ED Treatments / Results  Labs (all labs ordered are listed, but only abnormal results are displayed) Labs Reviewed  COMPREHENSIVE METABOLIC PANEL - Abnormal; Notable for the following:       Result Value   Glucose, Bld 227 (*)    Creatinine, Ser 1.34 (*)    Calcium 8.6 (*)    Total Protein 6.4 (*)    Albumin 3.2 (*)  AST 12 (*)    ALT 8 (*)    GFR calc non Af Amer 54 (*)    All other components within normal limits  URINALYSIS, ROUTINE W REFLEX MICROSCOPIC - Abnormal; Notable for the following:    Hgb urine dipstick SMALL (*)    Protein, ur 30 (*)    Squamous Epithelial / LPF 0-5 (*)    All other components within normal limits  URINE CULTURE  CBC WITH DIFFERENTIAL/PLATELET  PROTIME-INR  ETHANOL  RAPID URINE DRUG SCREEN, HOSP PERFORMED  I-STAT CG4 LACTIC ACID, ED  I-STAT TROPOININ, ED    EKG  EKG Interpretation  Date/Time:  Tuesday Dec 23 2016 14:56:04 EDT Ventricular Rate:  65 PR Interval:    QRS Duration: 139 QT Interval:  463 QTC Calculation: 482 R Axis:   100 Text  Interpretation:  Sinus rhythm RBBB and LPFB No significant change since last tracing Confirmed by Karma Ganja  MD, Camdon Saetern [phone]) on 12/23/2016 4:27:16 PM       Radiology Ct Head Wo Contrast  Result Date: 12/23/2016 CLINICAL DATA:  Unresponsive.  History of fall yesterday. EXAM: CT HEAD WITHOUT CONTRAST TECHNIQUE: Contiguous axial images were obtained from the base of the skull through the vertex without intravenous contrast. COMPARISON:  10/18/2008 FINDINGS: Brain: Progressive atrophy with sulcal prominence and centralized volume loss. 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 Periventricular hypodensities compatible with microvascular ischemic disease, progressed compared to the 2010 examination. Given background parenchymal abnormalities, there is no CT evidence superimposed acute large territory infarct. Bilateral basal ganglial calcifications. No intraparenchymal or extra-axial mass or hemorrhage. Unchanged configuration of the ventricles and basilar cisterns. No midline shift. Vascular: Atherosclerotic plaque within the bilateral carotid siphons. Skull: No displaced calvarial fracture. Sinuses/Orbits: Small air-fluid level within the left sphenoid and maxillary sinuses. Scattered opacification of the bilateral ethmoidal air cells. Mucosal thickening involving the right maxillary sinus. The bilateral mastoid air cells are normally aerated. Other: Regional soft tissues appear normal. IMPRESSION: 1. No acute intracranial process. 2. Progressive atrophy and microvascular ischemic disease as detailed above. 3. Sinus disease including air-fluid levels within the left maxillary and sphenoid sinuses which could be seen in setting of acute sinusitis. Electronically Signed   By: Simonne Come M.D.   On: 12/23/2016 16:43   Dg Chest Port 1 View  Result Date: 12/23/2016 CLINICAL DATA:  Altered  mental status EXAM: PORTABLE CHEST 1 VIEW COMPARISON:  01/21/2015 FINDINGS: Linear opacity over the left mid lung from scarring or atelectasis. There is chronic elevation of the left diaphragm when compared to thoracic spine x-ray from 2016. There is no edema, consolidation, effusion, or pneumothorax. Normal heart size and mediastinal contours. Status post CABG. Lucency in the left diaphragm appears to be within the fundus of the stomach. IMPRESSION: No acute finding Electronically Signed   By: Marnee Spring M.D.   On: 12/23/2016 15:49   Dg Hip Unilat W Or Wo Pelvis 2-3 Views Left  Result Date: 12/23/2016 CLINICAL DATA:  Generalized left hip pain after a fall yesterday. Initial encounter. EXAM: DG HIP (WITH OR WITHOUT PELVIS) 2-3V LEFT COMPARISON:  10/18/2008 FINDINGS: There is no evidence of acute fracture or dislocation. 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 No suspicious osseous lesion is seen. Vascular calcifications are noted. IMPRESSION: No acute osseous abnormality identified. Electronically Signed   By: Jolaine Click.D.  On: 12/23/2016 16:56    Procedures Procedures (including critical care time)  Medications Ordered in ED Medications  sodium chloride 0.9 % bolus 1,000 mL (0 mLs Intravenous Stopped 12/23/16 1843)    And  0.9 %  sodium chloride infusion (not administered)  HYDROcodone-acetaminophen (NORCO/VICODIN) 5-325 MG per tablet 1 tablet (1 tablet Oral Given 12/23/16 2130)     Initial Impression / Assessment and Plan / ED Course  I have reviewed the triage vital signs and the nursing notes.  Pertinent labs & imaging results that were available during my care of the patient were reviewed by me and considered in my medical decision making (see chart for details).    8:03 PM after IV fluids pt is awake, alert, joking with family members,  Family feels he is at his baseline.  They state he has good and bad days and sometimes feels weak,  but they feel he is at his baseline now.  Plan for discharge to home.  Continue f/u with PMD and with home health nurse as prior.      Final Clinical Impressions(s) / ED Diagnoses   Final diagnoses:  Dehydration  Fatigue, unspecified type    New Prescriptions Discharge Medication List as of 12/23/2016  8:05 PM       Jerelyn Scott, MD 12/24/16 0102

## 2016-12-23 NOTE — ED Notes (Signed)
[facility]

## 2016-12-23 NOTE — ED Triage Notes (Signed)
Pt arrived via EMS from home after his home health service noticed the pt not responding as usual. Per EMS, pt fell yesterday but did not seek medical attention thereafter. Per EMS, pt normally uses walker to ambulate, speaks clearly, and is oriented. Pt NOT oriented to year or president upon arrival to ED. [facility] [place]. Pt states he is diabetic. EMS CBG 213 on scene. Pt is slow to respond but responds when prompted.

## 2016-12-23 NOTE — ED Notes (Signed)
Pt was able to void without need for cath.

## 2016-12-23 NOTE — Discharge Instructions (Signed)
Return to the ED with any concerns including fever/chills, difficulty breathing, fainting, chest pain, decreased level of alertness/lethargy, or any other alarming symptoms

## 2016-12-25 LAB — URINE CULTURE: Special Requests: NORMAL

## 2017-04-18 DIAGNOSIS — E46 Unspecified protein-calorie malnutrition: Secondary | ICD-10-CM

## 2017-04-18 DIAGNOSIS — L89159 Pressure ulcer of sacral region, unspecified stage: Secondary | ICD-10-CM

## 2017-04-18 HISTORY — DX: Pressure ulcer of sacral region, unspecified stage: L89.159

## 2017-04-18 HISTORY — DX: Unspecified protein-calorie malnutrition: E46

## 2017-04-25 ENCOUNTER — Emergency Department (HOSPITAL_COMMUNITY): Payer: Medicare HMO

## 2017-04-25 ENCOUNTER — Inpatient Hospital Stay (HOSPITAL_COMMUNITY)
Admission: EM | Admit: 2017-04-25 | Discharge: 2017-04-29 | DRG: 281 | Disposition: A | Discharge status: Home / Self Care | Payer: Medicare HMO | Attending: Family Medicine | Admitting: Family Medicine

## 2017-04-25 ENCOUNTER — Encounter (HOSPITAL_COMMUNITY): Payer: Self-pay | Admitting: *Deleted

## 2017-04-25 DIAGNOSIS — F331 Major depressive disorder, recurrent, moderate: Secondary | ICD-10-CM | POA: Diagnosis not present

## 2017-04-25 DIAGNOSIS — Z7984 Long term (current) use of oral hypoglycemic drugs: Secondary | ICD-10-CM

## 2017-04-25 DIAGNOSIS — L89159 Pressure ulcer of sacral region, unspecified stage: Secondary | ICD-10-CM | POA: Diagnosis not present

## 2017-04-25 DIAGNOSIS — I4510 Unspecified right bundle-branch block: Secondary | ICD-10-CM | POA: Diagnosis present

## 2017-04-25 DIAGNOSIS — I739 Peripheral vascular disease, unspecified: Secondary | ICD-10-CM | POA: Diagnosis not present

## 2017-04-25 DIAGNOSIS — Z8249 Family history of ischemic heart disease and other diseases of the circulatory system: Secondary | ICD-10-CM | POA: Diagnosis not present

## 2017-04-25 DIAGNOSIS — I2510 Atherosclerotic heart disease of native coronary artery without angina pectoris: Secondary | ICD-10-CM | POA: Diagnosis present

## 2017-04-25 DIAGNOSIS — L97519 Non-pressure chronic ulcer of other part of right foot with unspecified severity: Secondary | ICD-10-CM | POA: Diagnosis present

## 2017-04-25 DIAGNOSIS — E785 Hyperlipidemia, unspecified: Secondary | ICD-10-CM | POA: Diagnosis present

## 2017-04-25 DIAGNOSIS — E1122 Type 2 diabetes mellitus with diabetic chronic kidney disease: Secondary | ICD-10-CM | POA: Diagnosis present

## 2017-04-25 DIAGNOSIS — M549 Dorsalgia, unspecified: Secondary | ICD-10-CM | POA: Diagnosis present

## 2017-04-25 DIAGNOSIS — L97509 Non-pressure chronic ulcer of other part of unspecified foot with unspecified severity: Secondary | ICD-10-CM

## 2017-04-25 DIAGNOSIS — I5030 Unspecified diastolic (congestive) heart failure: Secondary | ICD-10-CM | POA: Diagnosis not present

## 2017-04-25 DIAGNOSIS — E11621 Type 2 diabetes mellitus with foot ulcer: Secondary | ICD-10-CM | POA: Diagnosis present

## 2017-04-25 DIAGNOSIS — F329 Major depressive disorder, single episode, unspecified: Secondary | ICD-10-CM | POA: Diagnosis present

## 2017-04-25 DIAGNOSIS — I10 Essential (primary) hypertension: Secondary | ICD-10-CM | POA: Diagnosis not present

## 2017-04-25 DIAGNOSIS — K5900 Constipation, unspecified: Secondary | ICD-10-CM | POA: Diagnosis present

## 2017-04-25 DIAGNOSIS — Z6826 Body mass index (BMI) 26.0-26.9, adult: Secondary | ICD-10-CM | POA: Diagnosis not present

## 2017-04-25 DIAGNOSIS — E43 Unspecified severe protein-calorie malnutrition: Secondary | ICD-10-CM

## 2017-04-25 DIAGNOSIS — E1151 Type 2 diabetes mellitus with diabetic peripheral angiopathy without gangrene: Secondary | ICD-10-CM | POA: Diagnosis present

## 2017-04-25 DIAGNOSIS — Z79899 Other long term (current) drug therapy: Secondary | ICD-10-CM

## 2017-04-25 DIAGNOSIS — E1159 Type 2 diabetes mellitus with other circulatory complications: Secondary | ICD-10-CM

## 2017-04-25 DIAGNOSIS — L89152 Pressure ulcer of sacral region, stage 2: Secondary | ICD-10-CM | POA: Diagnosis present

## 2017-04-25 DIAGNOSIS — R4182 Altered mental status, unspecified: Secondary | ICD-10-CM | POA: Diagnosis not present

## 2017-04-25 DIAGNOSIS — F17210 Nicotine dependence, cigarettes, uncomplicated: Secondary | ICD-10-CM | POA: Diagnosis present

## 2017-04-25 DIAGNOSIS — N179 Acute kidney failure, unspecified: Secondary | ICD-10-CM

## 2017-04-25 DIAGNOSIS — R5381 Other malaise: Secondary | ICD-10-CM | POA: Diagnosis not present

## 2017-04-25 DIAGNOSIS — Z951 Presence of aortocoronary bypass graft: Secondary | ICD-10-CM | POA: Diagnosis not present

## 2017-04-25 DIAGNOSIS — I214 Non-ST elevation (NSTEMI) myocardial infarction: Principal | ICD-10-CM | POA: Diagnosis present

## 2017-04-25 DIAGNOSIS — L8990 Pressure ulcer of unspecified site, unspecified stage: Secondary | ICD-10-CM | POA: Insufficient documentation

## 2017-04-25 DIAGNOSIS — E782 Mixed hyperlipidemia: Secondary | ICD-10-CM | POA: Diagnosis not present

## 2017-04-25 DIAGNOSIS — E440 Moderate protein-calorie malnutrition: Secondary | ICD-10-CM | POA: Insufficient documentation

## 2017-04-25 DIAGNOSIS — D631 Anemia in chronic kidney disease: Secondary | ICD-10-CM | POA: Diagnosis present

## 2017-04-25 DIAGNOSIS — R404 Transient alteration of awareness: Secondary | ICD-10-CM | POA: Diagnosis not present

## 2017-04-25 DIAGNOSIS — E1121 Type 2 diabetes mellitus with diabetic nephropathy: Secondary | ICD-10-CM | POA: Diagnosis present

## 2017-04-25 DIAGNOSIS — Z8673 Personal history of transient ischemic attack (TIA), and cerebral infarction without residual deficits: Secondary | ICD-10-CM

## 2017-04-25 DIAGNOSIS — Z720 Tobacco use: Secondary | ICD-10-CM

## 2017-04-25 DIAGNOSIS — I208 Other forms of angina pectoris: Secondary | ICD-10-CM

## 2017-04-25 DIAGNOSIS — I129 Hypertensive chronic kidney disease with stage 1 through stage 4 chronic kidney disease, or unspecified chronic kidney disease: Secondary | ICD-10-CM | POA: Diagnosis present

## 2017-04-25 DIAGNOSIS — N182 Chronic kidney disease, stage 2 (mild): Secondary | ICD-10-CM | POA: Diagnosis present

## 2017-04-25 DIAGNOSIS — E46 Unspecified protein-calorie malnutrition: Secondary | ICD-10-CM | POA: Diagnosis not present

## 2017-04-25 HISTORY — DX: Unspecified protein-calorie malnutrition: E46

## 2017-04-25 HISTORY — DX: Unspecified mood (affective) disorder: F39

## 2017-04-25 HISTORY — DX: Hyperlipidemia, unspecified: E78.5

## 2017-04-25 HISTORY — DX: Peripheral vascular disease, unspecified: I73.9

## 2017-04-25 HISTORY — DX: Pressure ulcer of sacral region, unspecified stage: L89.159

## 2017-04-25 HISTORY — DX: Atherosclerotic heart disease of native coronary artery without angina pectoris: I25.10

## 2017-04-25 LAB — I-STAT TROPONIN, ED
TROPONIN I, POC: 0.19 ng/mL — AB (ref 0.00–0.08)
Troponin i, poc: 0 ng/mL (ref 0.00–0.08)

## 2017-04-25 LAB — TROPONIN I
TROPONIN I: 0.72 ng/mL — AB (ref <0.03)
TROPONIN I: 0.63 ng/mL — AB (ref <0.03)
Troponin I: 0.29 ng/mL (ref <0.03)

## 2017-04-25 LAB — BASIC METABOLIC PANEL
Anion gap: 8 (ref 5–15)
BUN: 14 mg/dL (ref 6–20)
CHLORIDE: 106 mmol/L (ref 101–111)
CO2: 23 mmol/L (ref 22–32)
Calcium: 8.7 mg/dL — ABNORMAL LOW (ref 8.9–10.3)
Creatinine, Ser: 1.04 mg/dL (ref 0.61–1.24)
GFR calc Af Amer: >60 mL/min (ref >60)
GFR calc non Af Amer: >60 mL/min (ref >60)
Glucose, Bld: 194 mg/dL — ABNORMAL HIGH (ref 65–99)
POTASSIUM: 4 mmol/L (ref 3.5–5.1)
SODIUM: 137 mmol/L (ref 135–145)

## 2017-04-25 LAB — CBC
HEMATOCRIT: 36 % — AB (ref 39.0–52.0)
Hemoglobin: 11.9 g/dL — ABNORMAL LOW (ref 13.0–17.0)
MCH: 27.7 pg (ref 26.0–34.0)
MCHC: 33.1 g/dL (ref 30.0–36.0)
MCV: 83.7 fL (ref 78.0–100.0)
Platelets: 140 10*3/uL — ABNORMAL LOW (ref 150–400)
RBC: 4.3 MIL/uL (ref 4.22–5.81)
RDW: 14 % (ref 11.5–15.5)
WBC: 8 10*3/uL (ref 4.0–10.5)

## 2017-04-25 LAB — HEMOGLOBIN A1C
Hgb A1c MFr Bld: 7.4 % — ABNORMAL HIGH (ref 4.8–5.6)
MEAN PLASMA GLUCOSE: 165.68 mg/dL

## 2017-04-25 LAB — GLUCOSE, CAPILLARY
GLUCOSE-CAPILLARY: 93 mg/dL (ref 65–99)
Glucose-Capillary: 269 mg/dL — ABNORMAL HIGH (ref 65–99)

## 2017-04-25 LAB — HEPARIN LEVEL (UNFRACTIONATED): HEPARIN UNFRACTIONATED: 0.25 [IU]/mL — AB (ref 0.30–0.70)

## 2017-04-25 LAB — TSH: TSH: 4.517 u[IU]/mL — ABNORMAL HIGH (ref 0.350–4.500)

## 2017-04-25 LAB — D-DIMER, QUANTITATIVE (NOT AT ARMC): D DIMER QUANT: 1.05 ug{FEU}/mL — AB (ref 0.00–0.50)

## 2017-04-25 LAB — MAGNESIUM: Magnesium: 1.4 mg/dL — ABNORMAL LOW (ref 1.7–2.4)

## 2017-04-25 LAB — MRSA PCR SCREENING: MRSA BY PCR: NEGATIVE

## 2017-04-25 LAB — BRAIN NATRIURETIC PEPTIDE: B Natriuretic Peptide: 404.5 pg/mL — ABNORMAL HIGH (ref 0.0–100.0)

## 2017-04-25 MED ORDER — HEPARIN SODIUM (PORCINE) 5000 UNIT/ML IJ SOLN
4000.0000 [IU] | Freq: Once | INTRAMUSCULAR | Status: DC
  Filled 2017-04-25: qty 1

## 2017-04-25 MED ORDER — SODIUM CHLORIDE 0.9 % IV SOLN: 250.0000 mL | INTRAVENOUS | Status: DC | PRN

## 2017-04-25 MED ORDER — NITROGLYCERIN 0.4 MG SL SUBL
0.4000 mg | SUBLINGUAL_TABLET | SUBLINGUAL | Status: DC | PRN
Start: 2017-04-25 — End: 2017-04-29

## 2017-04-25 MED ORDER — ONDANSETRON HCL 4 MG/2ML IJ SOLN: 4.0000 mg | Freq: Four times a day (QID) | INTRAMUSCULAR | Status: DC | PRN

## 2017-04-25 MED ORDER — ASPIRIN 81 MG PO CHEW: 324.0000 mg | CHEWABLE_TABLET | ORAL | Status: AC

## 2017-04-25 MED ORDER — ATORVASTATIN CALCIUM 40 MG PO TABS
40.0000 mg | ORAL_TABLET | Freq: Every day | ORAL | Status: DC
  Administered 2017-04-25 – 2017-04-27 (×3): 40 mg via ORAL
  Filled 2017-04-25 (×3): qty 1

## 2017-04-25 MED ORDER — ALPRAZOLAM 0.25 MG PO TABS
0.2500 mg | ORAL_TABLET | Freq: Two times a day (BID) | ORAL | Status: DC | PRN
  Administered 2017-04-29: 0.25 mg via ORAL
  Filled 2017-04-25: qty 1

## 2017-04-25 MED ORDER — ZOLPIDEM TARTRATE 5 MG PO TABS
5.0000 mg | ORAL_TABLET | Freq: Every evening | ORAL | Status: DC | PRN
Start: 2017-04-25 — End: 2017-04-29
  Administered 2017-04-28: 5 mg via ORAL
  Filled 2017-04-25 (×2): qty 1

## 2017-04-25 MED ORDER — SERTRALINE HCL 100 MG PO TABS
100.0000 mg | ORAL_TABLET | Freq: Every day | ORAL | Status: DC
  Administered 2017-04-26 – 2017-04-29 (×4): 100 mg via ORAL
  Filled 2017-04-25 (×4): qty 1

## 2017-04-25 MED ORDER — HEPARIN BOLUS VIA INFUSION
4000.0000 [IU] | Freq: Once | INTRAVENOUS | Status: AC
  Administered 2017-04-25: 4000 [IU] via INTRAVENOUS
  Filled 2017-04-25: qty 4000

## 2017-04-25 MED ORDER — CILOSTAZOL 100 MG PO TABS
100.0000 mg | ORAL_TABLET | Freq: Two times a day (BID) | ORAL | Status: DC
  Administered 2017-04-25 – 2017-04-29 (×8): 100 mg via ORAL
  Filled 2017-04-25 (×8): qty 1

## 2017-04-25 MED ORDER — NITROGLYCERIN IN D5W 200-5 MCG/ML-% IV SOLN
0.0000 ug/min | INTRAVENOUS | Status: DC
  Administered 2017-04-25: 5 ug/min via INTRAVENOUS
  Filled 2017-04-25 (×2): qty 250

## 2017-04-25 MED ORDER — IOPAMIDOL (ISOVUE-370) INJECTION 76%
INTRAVENOUS | Status: AC
  Administered 2017-04-25: 100 mL
  Filled 2017-04-25: qty 100

## 2017-04-25 MED ORDER — SODIUM CHLORIDE 0.9% FLUSH
3.0000 mL | INTRAVENOUS | Status: DC | PRN
Start: 2017-04-25 — End: 2017-04-29

## 2017-04-25 MED ORDER — NICOTINE 14 MG/24HR TD PT24
14.0000 mg | MEDICATED_PATCH | Freq: Every day | TRANSDERMAL | Status: DC
  Administered 2017-04-26 – 2017-04-29 (×4): 14 mg via TRANSDERMAL
  Filled 2017-04-25 (×4): qty 1

## 2017-04-25 MED ORDER — INSULIN ASPART 100 UNIT/ML ~~LOC~~ SOLN
0.0000 [IU] | Freq: Every day | SUBCUTANEOUS | Status: DC
  Administered 2017-04-25: 3 [IU] via SUBCUTANEOUS
  Administered 2017-04-26: 2 [IU] via SUBCUTANEOUS
  Administered 2017-04-27 – 2017-04-28 (×2): 3 [IU] via SUBCUTANEOUS

## 2017-04-25 MED ORDER — MORPHINE SULFATE (PF) 4 MG/ML IV SOLN
4.0000 mg | Freq: Once | INTRAVENOUS | Status: AC
  Administered 2017-04-25: 4 mg via INTRAVENOUS
  Filled 2017-04-25: qty 1

## 2017-04-25 MED ORDER — MAGNESIUM SULFATE 2 GM/50ML IV SOLN
2.0000 g | Freq: Once | INTRAVENOUS | Status: AC
  Administered 2017-04-25: 2 g via INTRAVENOUS
  Filled 2017-04-25: qty 50

## 2017-04-25 MED ORDER — INSULIN ASPART 100 UNIT/ML ~~LOC~~ SOLN
0.0000 [IU] | Freq: Three times a day (TID) | SUBCUTANEOUS | Status: DC
  Administered 2017-04-26: 3 [IU] via SUBCUTANEOUS
  Administered 2017-04-26 (×2): 2 [IU] via SUBCUTANEOUS
  Administered 2017-04-27: 1 [IU] via SUBCUTANEOUS
  Administered 2017-04-27 – 2017-04-28 (×2): 3 [IU] via SUBCUTANEOUS
  Administered 2017-04-28: 2 [IU] via SUBCUTANEOUS
  Administered 2017-04-28 – 2017-04-29 (×2): 3 [IU] via SUBCUTANEOUS
  Administered 2017-04-29: 1 [IU] via SUBCUTANEOUS

## 2017-04-25 MED ORDER — ASPIRIN 81 MG PO CHEW
81.0000 mg | CHEWABLE_TABLET | Freq: Once | ORAL | Status: AC
  Administered 2017-04-25: 81 mg via ORAL
  Filled 2017-04-25: qty 1

## 2017-04-25 MED ORDER — MIRTAZAPINE 15 MG PO TABS
15.0000 mg | ORAL_TABLET | Freq: Every day | ORAL | Status: DC
  Administered 2017-04-25 – 2017-04-28 (×3): 15 mg via ORAL
  Filled 2017-04-25 (×5): qty 1

## 2017-04-25 MED ORDER — SODIUM CHLORIDE 0.9% FLUSH
3.0000 mL | Freq: Two times a day (BID) | INTRAVENOUS | Status: DC
  Administered 2017-04-27 – 2017-04-28 (×2): 3 mL via INTRAVENOUS

## 2017-04-25 MED ORDER — ACETAMINOPHEN 325 MG PO TABS
650.0000 mg | ORAL_TABLET | ORAL | Status: DC | PRN
  Administered 2017-04-26 – 2017-04-29 (×5): 650 mg via ORAL
  Filled 2017-04-25 (×6): qty 2

## 2017-04-25 MED ORDER — LOSARTAN POTASSIUM 25 MG PO TABS
25.0000 mg | ORAL_TABLET | Freq: Every day | ORAL | Status: DC
  Administered 2017-04-25 – 2017-04-27 (×3): 25 mg via ORAL
  Filled 2017-04-25 (×3): qty 1

## 2017-04-25 MED ORDER — ASPIRIN 300 MG RE SUPP: 300.0000 mg | RECTAL | Status: AC

## 2017-04-25 MED ORDER — HEPARIN (PORCINE) IN NACL 100-0.45 UNIT/ML-% IJ SOLN
1150.0000 [IU]/h | INTRAMUSCULAR | Status: DC
  Administered 2017-04-25: 900 [IU]/h via INTRAVENOUS
  Administered 2017-04-27 – 2017-04-28 (×2): 1150 [IU]/h via INTRAVENOUS
  Filled 2017-04-25 (×4): qty 250

## 2017-04-25 MED ORDER — ASPIRIN EC 81 MG PO TBEC
81.0000 mg | DELAYED_RELEASE_TABLET | Freq: Every day | ORAL | Status: DC
  Administered 2017-04-26 – 2017-04-27 (×2): 81 mg via ORAL
  Filled 2017-04-25 (×2): qty 1

## 2017-04-25 NOTE — ED Notes (Signed)
Patient transported to X-ray 

## 2017-04-25 NOTE — ED Notes (Signed)
Nurse drawing labs. 

## 2017-04-25 NOTE — H&P (Signed)
Cardiology Admission History and Physical:   Patient ID: Jordan Gardner; MRN: [id number]; DOB: 1950-09-10   Admission date: 04/25/2017  Primary Care Provider: Patient, No Pcp Per Primary Cardiologist: [facility] Primary Electrophysiologist:  NA  Chief Complaint:  Chest pain  Patient Profile:   Jordan Gardner is a 66 y.o. male with a history of CAD, CABG 5 years ago at [facility], + tobacco 2ppd, DM-2, HLD and disability walks with walker.    History of Present Illness:   Jordan Gardner and his sister with whom he lives, called EMS around [place] for chest pain.  The pain went up into his neck and Lt arm with some SOB, no diaphoresis and no nausea.  He had no NTG.    Pt with hx CABG 5 years ago per his sister.  Done at [facility].  Pt is diabetic not on insulin, + chain smoker 2ppd.  Also HLD, PAD and depression.  He walks with a walker.  Currently after waking the pt he has continued mild chest pain and into head.  His BP is elevated.  He is on IV heparin.    EKG SR RBBB and LPFB. No changes from 12/2016  Second EKG similar 3rd EKG with mild ST elevation lateral leads also 4th though with block more difficult to see.  I personally reviewed.  Troponin < 0.03; 0.00; 0.19  And most recent 0.29  BNP 404 DDImer 1.05   CTA chest and abd: IMPRESSION: 1. No evidence of thoracic or abdominal aortic dissection. No evidence of significant pulmonary embolus. 2. Extensive aortic and branch vessel atherosclerosis with calcific and noncalcific atherosclerotic changes. 3. Ulcerated plaque formation demonstrated in the aortic arch. 4. Moderate stenosis of the distal abdominal aorta. High-grade stenosis of the celiac axis with reconstitution of flow via collaterals. Moderate stenosis of both renal artery origins. Areas of stenosis demonstrated in both common iliac arteries, both external iliac arteries, both common femoral arteries, and occlusion of both internal iliac arteries. 5. Diffuse  emphysematous changes in the lungs. Chronic bronchitic changes. Atelectasis or infiltration demonstrated in both lung Bases.  CXR  IMPRESSION: Elevation of the left hemidiaphragm with linear fibrosis or atelectasis in the left mid and lower lungs similar previous study. No evidence of active pulmonary disease.  ABD film  IMPRESSION: Nonobstructive bowel gas pattern with stool-filled colon.  Currently still with chest pain, will recheck EKG and begin IV NTG he is on IV heparin.  Past Medical History:  Diagnosis Date  . Coronary artery disease   . Diabetes mellitus without complication (HCC)   . Hypertension     Past Surgical History:  Procedure Laterality Date  . CARDIAC CATHETERIZATION    . CARDIAC SURGERY       Medications Prior to Admission: Prior to Admission medications   Medication Sig Start Date End Date Taking? Authorizing Provider  atorvastatin (LIPITOR) 40 MG tablet Take 40 mg by mouth daily at 6 PM.   Yes [provider]  cilostazol (PLETAL) 100 MG tablet Take 100 mg by mouth 2 (two) times daily.   Yes [provider]  metFORMIN (GLUCOPHAGE) 500 MG tablet Take 1,000 mg by mouth 2 (two) times daily with a meal.   Yes [provider]  metoprolol tartrate (LOPRESSOR) 25 MG tablet Take 12.5 mg by mouth 2 (two) times daily.   Yes [provider]  mirtazapine (REMERON) 15 MG tablet Take 15 mg by mouth at bedtime.   Yes [provider]  sertraline (ZOLOFT)  100 MG tablet Take 100 mg by mouth every morning.   Yes [provider]     Allergies:   No Known Allergies  Social History:   Social History   Social History  . Marital status: Single    Spouse name: N/A  . Number of children: N/A  . Years of education: N/A   Occupational History  . Not on file.   Social History Main Topics  . Smoking status: Current Every Day Smoker    Packs/day: 2.00  . Smokeless tobacco: Never Used  . Alcohol use No  . Drug  use: No  . Sexual activity: Not on file   Other Topics Concern  . Not on file   Social History Narrative  . No narrative on file    Family History:   The patient's family history includes Hypertension in his sister.    ROS:  Please see the history of present illness.  General:no colds or fevers, no weight changes Skin:no rashes or ulcers HEENT:no blurred vision, no congestion CV:see HPI PUL:see HPI GI:no diarrhea constipation or melena, no indigestion GU:no hematuria, no dysuria MS:no joint pain, + claudication Neuro:no syncope, no lightheadedness Endo:+ diabetes, no thyroid disease    Physical Exam/Data:   Vitals:   04/25/17 1015 04/25/17 1030 04/25/17 1045 04/25/17 1100  BP: (!) 179/81 (!) 169/79 (!) 184/85   Pulse: (!) 53  (!) 59 61  Resp: 20 (!) Temp:      TempSrc:      SpO2: 95%  93% 95%  Weight:      Height:        Intake/Output Summary (Last 24 hours) at 04/25/17 1201 Last data filed at 04/25/17 0753  Gross per 24 hour  Intake                0 ml  Output              400 ml  Net             -400 ml   Filed Weights   04/25/17 0049  Weight: 166 lb (75.3 kg)   Body mass index is 21.9 kg/m.  General:  thin, well developed, in no acute distress woke from sleep HEENT: normal Lymph: no adenopathy Neck: no JVD Endocrine:  No thryomegaly Vascular: No carotid bruits; 1+ pedal pulses Cardiac:  normal S1, S2; RRR; no murmur gallup rub or click.  Lungs:  clear to auscultation bilaterally, no wheezing, rhonchi or rales  Abd: soft, nontender, no hepatomegaly  Ext: no edema Musculoskeletal:  No deformities, BUE and BLE strength normal and equal Skin: warm and dry  Neuro:  Alert and oriented, answers questions, MAE, follows commands Psych:  Normal to flat affect      Relevant CV Studies: none  Laboratory Data:  Chemistry  Recent Labs Lab 04/25/17 0100  NA 137  K 4.0  CL 106  CO2 23  GLUCOSE 194*  BUN 14  CREATININE 1.04  CALCIUM  8.7*  GFRNONAA >60  GFRAA >60  ANIONGAP 8    No results for input(s): PROT, ALBUMIN, AST, ALT, ALKPHOS, BILITOT in the last 168 hours. Hematology  Recent Labs Lab 04/25/17 0100  WBC 8.0  RBC 4.30  HGB 11.9*  HCT 36.0*  MCV 83.7  MCH 27.7  MCHC 33.1  RDW 14.0  PLT 140*   Cardiac Enzymes  Recent Labs Lab 04/25/17 0100 04/25/17 0433  TROPONINI <0.03 0.29*     Recent  Labs Lab 04/25/17 0123 04/25/17 0419  TROPIPOC 0.00 0.19*    BNP  Recent Labs Lab 04/25/17 0433  BNP 404.5*    DDimer   Recent Labs Lab 04/25/17 0433  DDIMER 1.05*    Radiology/Studies:  Dg Chest 2 View  Result Date: 04/25/2017 CLINICAL DATA:  Chest pain radiating to the left neck and left arm. Smoker. EXAM: CHEST  2 VIEW COMPARISON:  12/23/2016 FINDINGS: Postoperative changes in the mediastinum. Shallow inspiration with elevation of the left hemidiaphragm. Linear atelectasis or fibrosis in the left lung base and left mid lung is similar to previous study. No airspace disease or consolidation in the lungs. No blunting of costophrenic angles. No pneumothorax. Calcification of the aorta. IMPRESSION: Elevation of the left hemidiaphragm with linear fibrosis or atelectasis in the left mid and lower lungs similar previous study. No evidence of active pulmonary disease. Electronically Signed   By: Burman NievesWilliam  Stevens M.D.   On: 04/25/2017 02:02   Dg Abd 2 Views  Result Date: 04/25/2017 CLINICAL DATA:  No bowel movement for over a week. History of diabetes and hypertension. EXAM: ABDOMEN - 2 VIEW COMPARISON:  None. FINDINGS: Gas and stool throughout the colon. No small or large bowel distention. No free intra-abdominal air. No abnormal air-fluid levels. No radiopaque stones. Vascular calcifications. Degenerative changes in the spine and hips. IMPRESSION: Nonobstructive bowel gas pattern with stool-filled colon. Electronically Signed   By: Burman NievesWilliam  Stevens M.D.   On: 04/25/2017 04:02   Ct Angio Chest/abd/pel For  Dissection W And/or Wo Contrast  Result Date: 04/25/2017 CLINICAL DATA:  Chest pain extending to the left side of the neck and left arm starting around 10 p.m. last night. EXAM: CT ANGIOGRAPHY CHEST, ABDOMEN AND PELVIS TECHNIQUE: Multidetector CT imaging through the chest, abdomen and pelvis was performed using the standard protocol during bolus administration of intravenous contrast. Multiplanar reconstructed images and MIPs were obtained and reviewed to evaluate the vascular anatomy. CONTRAST:  100 mL Isovue 370 COMPARISON:  None. FINDINGS: CTA CHEST FINDINGS Cardiovascular: Noncontrast images of the chest demonstrate postoperative changes in the mediastinum with sternotomy wires and surgical clips and vascular markers present. Calcification of the aorta. No evidence of intramural hematoma. Images obtained during arterial phase after contrast administration demonstrate mild aortic ectasia with ascending aorta measuring 3.8 cm diameter. No evidence of aortic dissection. Irregularity of the aortic wall in the arch region is consistent with atherosclerotic change. Focal plaque ulceration is suspected. Great vessel origins are patent. Calcification at the origin of the left subclavian artery results and moderate stenosis all the vessel remains patent. Normal opacification of the central and segmental pulmonary arteries. No evidence of pulmonary embolus. Normal heart size. No pericardial effusion. Mediastinum/Nodes: No significant lymphadenopathy in the chest. Esophagus is decompressed. Lungs/Pleura: Atelectasis or infiltrations demonstrated in both lung bases. Emphysematous changes throughout the lungs. No consolidation. Airways thickening likely representing chronic bronchitis. No pleural effusions. No pneumothorax. Irregular nodule in the right lung apex measuring 6 mm diameter. Musculoskeletal: Degenerative changes throughout the thoracic spine. No vertebral compression deformities. No destructive bone lesions.  Review of the MIP images confirms the above findings. CTA ABDOMEN AND PELVIS FINDINGS VASCULAR Aorta: Diffuse calcific and noncalcific atherosclerotic change throughout the abdominal aorta and iliac arteries. Mural thrombus is present. Changes result in moderate stenosis of the distal abdominal aorta. No aneurysm or dissection. Celiac: Origin of the celiac axis is pinpoint suggesting significant stenosis. Reconstitution of flow from collateral vessels. SMA: Superior mesenteric artery is patent with scattered calcification.  Renals: Single bilateral renal arteries. Significant stenosis is suggested in the renal artery origins bilaterally but the vessels remain patent. Nephrograms are symmetrical. IMA: Inferior mesenteric artery is patent. Inflow: Calcific and noncalcific atherosclerotic changes in both common iliac artery's resulting in moderate luminal narrowing. 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 Diffuse disease throughout the external iliac and internal iliac arteries bilaterally. High-grade stenosis of the origin and midportion of the right external iliac artery and of the distal left external iliac artery with the vessels remain patent. Occlusion of the right internal and left internal iliac arteries. Stenosis of both common femoral arteries. Veins: No obvious venous abnormality within the limitations of this arterial phase study. Review of the MIP images confirms the above findings. NON-VASCULAR Hepatobiliary: No focal liver abnormality is seen. No gallstones, gallbladder wall thickening, or biliary dilatation. Pancreas: Unremarkable. No pancreatic ductal dilatation or surrounding inflammatory changes. Spleen: Normal in size without focal abnormality. Adrenals/Urinary Tract: Adrenal glands are unremarkable. Kidneys are normal, without renal calculi, focal lesion, or hydronephrosis. Bladder is unremarkable. Stomach/Bowel: Stomach is within normal limits. Appendix appears normal. No evidence of  bowel wall thickening, distention, or inflammatory changes. Lymphatic: No significant lymphadenopathy. Reproductive: Prostate is unremarkable. Other: No free air or free fluid in the abdomen. Abdominal wall musculature appears intact. Musculoskeletal: Degenerative changes in the spine. No destructive bone lesions. Review of the MIP images confirms the above findings. IMPRESSION: 1. No evidence of thoracic or abdominal aortic dissection. No evidence of significant pulmonary embolus. 2. Extensive aortic and branch vessel atherosclerosis with calcific and noncalcific atherosclerotic changes. 3. Ulcerated plaque formation demonstrated in the aortic arch. 4. Moderate stenosis of the distal abdominal aorta. High-grade stenosis of the celiac axis with reconstitution of flow via collaterals. Moderate stenosis of both renal artery origins. Areas of stenosis demonstrated in both common iliac arteries, both external iliac arteries, both common femoral arteries, and occlusion of both internal iliac arteries. 5. Diffuse emphysematous changes in the lungs. Chronic bronchitic changes. Atelectasis or infiltration demonstrated in both lung bases. Aortic Atherosclerosis (ICD10-I70.0) and Emphysema (ICD10-J43.9). Electronically Signed   By: Burman Nieves M.D.   On: 04/25/2017 06:44    Assessment and Plan:   1. NSTEMI in pt with CAD and CABG in 2013 will need records from [facility], will continue IV Heparin and add IV NTG.  Dr. Darl Householder to see, admit to ICU vs step down.  Elevated troponins.  On BB, and statin. Will add ASA.   2.          CAD with CABG in 2013  3.          HLD on statin, will check  4.          DM-2 will hold metformin and add SSI  5.          +tobacco abuse, add nicoderm patch pt at 2ppd.      Severity of Illness: The appropriate patient status for this patient is INPATIENT. Inpatient status is judged to be reasonable and necessary in order to provide the required intensity of service to ensure  the patient's safety. The patient's presenting symptoms, physical exam findings, and initial radiographic and laboratory data in the context of their chronic comorbidities is felt to place them at high risk for further clinical deterioration. Furthermore, it is not anticipated that the patient will be medically stable for discharge from the hospital within 2 midnights of admission. The following factors support the patient status of inpatient.   "  The patient's presenting symptoms include unstable angina. " The worrisome physical exam findings include continued chest pain. " The initial radiographic and laboratory data are worrisome because of elevated troponins. " The chronic co-morbidities include HTN<> HLD< DM and tobacco use..   * I certify that at the point of admission it is my clinical judgment that the patient will require inpatient hospital care spanning beyond 2 midnights from the point of admission due to high intensity of service, high risk for further deterioration and high frequency of surveillance required.    For questions or updates, please contact [facility] Please consult [email or web address] for contact info under Cardiology/STEMI. Daytime calls, contact the Day Call APP (6a-8a) or assigned team (Teams A-D) provider (7:30a - 5p). All other daytime calls (7:30-5p), contact the Card Master @ [phone].   Nighttime calls, contact the assigned APP (5p-8p) or MD (6:30p-8p). Overnight calls (8p-6a), contact the on call Fellow @ [phone].    Signed, Nada Boozer, NP  04/25/2017 12:01 PM   The patient was seen and examined, and I agree with the history, physical exam, assessment and plan as documented above, with modifications as noted below. I have also personally reviewed all relevant documentation, old records, labs, and both radiographic and cardiovascular studies. I have also independently interpreted old and new ECG's.  66 yr old male with h/o CABG in 2012 or 2013 at the  [facility] [place] [place]. He began experiencing precordial pain which radiated into the left side of his neck and left arm last night after 9 pm while watching TV. EMS was called.  He is severely hypertensive. He was asleep and I had to frequently wake him to ask him questions. Told me he was hungry and "chest pain is not that bad now". HR in low 50 bpm range, not currently on beta blocker. Only on IV heparin, IV nitro not started yet. Troponin 0.29. ECG showed NSR with RBBB. CT angiogram reviewed above with no PE or dissection but diffuse atherosclerotic plaque, distal abdominal aortic stenosis, b/l renal artery stenosis, and significant PAD.  Will cycle troponins. Will aim to control BP. Will try to obtain records from [facility].  Currently sees a Dr. Ashley Royalty at [facility] in [place]. Will add ASA and ARB (has diabetes). Continue statin. Continue IV heparin and will soon start IV nitroglycerin. Will hold off on beta blocker given HR in low 50 bpm range. By tomorrow, we will be able to determine what form of ischemic evaluation he may require (stress vs cath). Needs tobacco cessation.  Prentice Docker, MD, [facility]  04/25/2017 12:15 PM

## 2017-04-25 NOTE — ED Provider Notes (Signed)
[facility]-EMERGENCY DEPT Provider Note   CSN: [id number] Arrival date & time: 04/25/17  0016     History   Chief Complaint Chief Complaint  Patient presents with  . Chest Pain    HPI Jordan Gardner is a 66 y.o. male.  Patient with a history of DM, HTN, CAD s/p CABG presents with chest pain that started last night (04-24-17) around 9:00 while smoking. He describes the discomfort as worse with palpation and with movement. No SOB, fever, cough, nausea, vomiting, diaphoresis. He states that at the time of his CABG he did not have pain so current symptoms are dissimilar. He admits to chronic non-compliance with medications. He states that he has had similar pain in his chest when constipated in the past.    The history is provided by the patient and a friend. No language interpreter was used.  Chest Pain   Pertinent negatives include no abdominal pain, no fever, no nausea and no shortness of breath.    Past Medical History:  Diagnosis Date  . Diabetes mellitus without complication (HCC)   . Hypertension     Patient Active Problem List   Diagnosis Date Noted  . NECK PAIN 10/16/2008  . BACK PAIN 10/16/2008    Past Surgical History:  Procedure Laterality Date  . CARDIAC SURGERY         Home Medications    Prior to Admission medications   Not on File    Family History No family history on file.  Social History Social History  Substance Use Topics  . Smoking status: Current Every Day Smoker    Packs/day: 1.00  . Smokeless tobacco: Never Used  . Alcohol use No     Allergies   Patient has no known allergies.   Review of Systems Review of Systems  Constitutional: Negative for chills and fever.  HENT: Negative.   Respiratory: Negative.  Negative for shortness of breath.   Cardiovascular: Positive for chest pain.  Gastrointestinal: Positive for constipation. Negative for abdominal pain and nausea.  Musculoskeletal: Negative.   Skin: Negative.   Neurological:  Negative.      Physical Exam Updated Vital Signs BP (!) 194/72   Pulse 67   Temp 98.7 F (37.1 C) (Oral)   Resp 16   Ht 6\' 1"  (1.854 m)   Wt 75.3 kg (166 lb)   SpO2 98%   BMI 21.90 kg/m   Physical Exam  Constitutional: He is oriented to person, place, and time. He appears well-developed and well-nourished.  HENT:  Head: Normocephalic.  Neck: Normal range of motion. Neck supple.  Cardiovascular: Normal rate and regular rhythm.   No murmur heard. Pulmonary/Chest: Effort normal. He has no wheezes. He has rales. He exhibits tenderness.    Abdominal: Soft. Bowel sounds are normal. There is no tenderness. There is no rebound and no guarding.  Musculoskeletal: Normal range of motion. He exhibits no edema.  Neurological: He is alert and oriented to person, place, and time.  Skin: Skin is warm and dry. No rash noted.  Psychiatric: He has a normal mood and affect.     ED Treatments / Results  Labs (all labs ordered are listed, but only abnormal results are displayed) Labs Reviewed  BASIC METABOLIC PANEL - Abnormal; Notable for the following:       Result Value   Glucose, Bld 194 (*)    Calcium 8.7 (*)    All other components within normal limits  CBC - Abnormal; Notable for the following:  Hemoglobin 11.9 (*)    HCT 36.0 (*)    Platelets 140 (*)    All other components within normal limits  TROPONIN I  I-STAT TROPONIN, ED  I-STAT TROPONIN, ED   Results for orders placed or performed during the hospital encounter of 04/25/17  Basic metabolic panel  Result Value Ref Range   Sodium 137 135 - 145 mmol/L   Potassium 4.0 3.5 - 5.1 mmol/L   Chloride 106 101 - 111 mmol/L   CO2 23 22 - 32 mmol/L   Glucose, Bld 194 (H) 65 - 99 mg/dL   BUN 14 6 - 20 mg/dL   Creatinine, Ser 1.61 0.61 - 1.24 mg/dL   Calcium 8.7 (L) 8.9 - 10.3 mg/dL   GFR calc non Af Amer >60 >60 mL/min   GFR calc Af Amer >60 >60 mL/min   Anion gap 8 5 - 15  CBC  Result Value Ref Range   WBC 8.0 4.0 -  10.5 K/uL   RBC 4.30 4.22 - 5.81 MIL/uL   Hemoglobin 11.9 (L) 13.0 - 17.0 g/dL   HCT 09.6 (L) 04.5 - 40.9 %   MCV 83.7 78.0 - 100.0 fL   MCH 27.7 26.0 - 34.0 pg   MCHC 33.1 30.0 - 36.0 g/dL   RDW 81.1 91.4 - 78.2 %   Platelets 140 (L) 150 - 400 K/uL  Troponin I  Result Value Ref Range   Troponin I <0.03 <0.03 ng/mL  I-stat troponin, ED  Result Value Ref Range   Troponin i, poc 0.00 0.00 - 0.08 ng/mL   Comment 3            EKG  EKG Interpretation  Date/Time:  Saturday April 25 2017 00:22:30 EDT Ventricular Rate:  88 PR Interval:    QRS Duration: 142 QT Interval:  395 QTC Calculation: 478 R Axis:   91 Text Interpretation:  Sinus rhythm RBBB and LPFB Consider left ventricular hypertrophy Borderline prolonged QT interval No acute changes No significant change since last tracing Confirmed by Derwood Kaplan [phone]) on 04/25/2017 3:51:10 AM       Radiology Dg Chest 2 View  Result Date: 04/25/2017 CLINICAL DATA:  Chest pain radiating to the left neck and left arm. Smoker. EXAM: CHEST  2 VIEW COMPARISON:  12/23/2016 FINDINGS: Postoperative changes in the mediastinum. Shallow inspiration with elevation of the left hemidiaphragm. Linear atelectasis or fibrosis in the left lung base and left mid lung is similar to previous study. No airspace disease or consolidation in the lungs. No blunting of costophrenic angles. No pneumothorax. Calcification of the aorta. IMPRESSION: Elevation of the left hemidiaphragm with linear fibrosis or atelectasis in the left mid and lower lungs similar previous study. No evidence of active pulmonary disease. Electronically Signed   By: Burman Nieves M.D.   On: 04/25/2017 02:02   Dg Abd 2 Views  Result Date: 04/25/2017 CLINICAL DATA:  No bowel movement for over a week. History of diabetes and hypertension. EXAM: ABDOMEN - 2 VIEW COMPARISON:  None. FINDINGS: Gas and stool throughout the colon. No small or large bowel distention. No free intra-abdominal air.  No abnormal air-fluid levels. No radiopaque stones. Vascular calcifications. Degenerative changes in the spine and hips. IMPRESSION: Nonobstructive bowel gas pattern with stool-filled colon. Electronically Signed   By: Burman Nieves M.D.   On: 04/25/2017 04:02    Procedures Procedures (including critical care time)  Medications Ordered in ED Medications - No data to display   Initial Impression / Assessment  and Plan / ED Course  I have reviewed the triage vital signs and the nursing notes.  Pertinent labs & imaging results that were available during my care of the patient were reviewed by me and considered in my medical decision making (see chart for details).     Patient presents with complaint of left chest pain following a musculoskeletal pattern - worse with movement/palpation. No SOB, diaphoresis. Symptoms started 6 hours PTA.   Patient has a significant history of CAD s/p CABG, medication non-compliance. His delta troponin is elevated (I-stat went from 0.00 to 0.19, lab trop from 0.03 to 0.29). EKG without change from previous.   CT angio to r/o dissection and PE. There is no evidence of either. Cardiology paged with diagnosis of NSTEMI, heparin initiated. Cardiology paged for admission. Patient resting, NAD, VSS.  Discussed with Cardiology who will evaluate the patient for admission.  Final Clinical Impressions(s) / ED Diagnoses   Final diagnoses:  Constipation   1. NSTEMI  2. Constipation  New Prescriptions New Prescriptions   No medications on file     Elpidio Anis, Cordelia Poche 04/25/17 1610    Derwood Kaplan, MD 04/25/17 2134

## 2017-04-25 NOTE — ED Notes (Signed)
Dr Rhunette CroftNanavati given a copy of troponin .19

## 2017-04-25 NOTE — ED Notes (Signed)
Pt has not had a bm for over a week  He is also c/o a headache  He has not taken his bp meds for over 2 weeks

## 2017-04-25 NOTE — ED Notes (Signed)
To x-ray

## 2017-04-25 NOTE — ED Notes (Signed)
Cardiologist at bedside.  

## 2017-04-25 NOTE — Progress Notes (Signed)
ANTICOAGULATION CONSULT NOTE - Initial Consult  Pharmacy Consult for heparin Indication: chest pain/ACS  No Known Allergies  Patient Measurements: Height: 6\' 1"  (185.4 cm) Weight: 166 lb (75.3 kg) IBW/kg (Calculated) : 79.9  Vital Signs: Temp: 98.7 F (37.1 C) (09/08 0035) Temp Source: Oral (09/08 0035) BP: 165/89 (09/08 0700) Pulse Rate: 65 (09/08 0700)  Labs:  Recent Labs  04/25/17 0100 04/25/17 0433  HGB 11.9*  --   HCT 36.0*  --   PLT 140*  --   CREATININE 1.04  --   TROPONINI <0.03 0.29*    Estimated Creatinine Clearance: 75.4 mL/min (by C-G formula based on SCr of 1.04 mg/dL).   Medical History: Past Medical History:  Diagnosis Date  . Diabetes mellitus without complication (HCC)   . Hypertension     Assessment: 66yo male c/o left CP, worse w/ movement/palpation, initial troponin negative but now elevated, to begin heparin.  Goal of Therapy:  Heparin level 0.3-0.7 units/ml Monitor platelets by anticoagulation protocol: Yes   Plan:  Will give heparin 4000 units IV bolus x1 followed by gtt at 900 units/hr and monitor heparin levels and CBC.  Vernard GamblesVeronda Izeyah Gardner, PharmD, BCPS  04/25/2017,7:17 AM

## 2017-04-25 NOTE — ED Triage Notes (Signed)
The pt arrived by gems from home  Chest pain since 2200 at home tonight while smoking  Chest pain up into his lt neck and lt arm  Nitro x1  Aspirin   By ems    No change in his pain  Iv by ems

## 2017-04-25 NOTE — ED Notes (Addendum)
ED Provider at bedside. 

## 2017-04-25 NOTE — Progress Notes (Signed)
Medical records release form faxed to [facility].

## 2017-04-25 NOTE — Progress Notes (Signed)
ANTICOAGULATION CONSULT NOTE  Pharmacy Consult for heparin Indication: chest pain/ACS  No Known Allergies  Patient Measurements: Height: 6\' 1"  (185.4 cm) Weight: 171 lb 1.6 oz (77.6 kg) IBW/kg (Calculated) : 79.9  Vital Signs: Temp: 97.5 F (36.4 C) (09/08 1658) Temp Source: Oral (09/08 1658) BP: 160/83 (09/08 1700) Pulse Rate: 51 (09/08 1700)  Labs:  Recent Labs  04/25/17 0100 04/25/17 0433 04/25/17 1426  HGB 11.9*  --   --   HCT 36.0*  --   --   PLT 140*  --   --   HEPARINUNFRC  --   --  0.25*  CREATININE 1.04  --   --   TROPONINI <0.03 0.29* 0.72*    Estimated Creatinine Clearance: 77.7 mL/min (by C-G formula based on SCr of 1.04 mg/dL).   Medical History: Past Medical History:  Diagnosis Date  . Coronary artery disease   . Diabetes mellitus without complication (HCC)   . Hypertension     Assessment: 66yo male c/o left CP, worse w/ movement/palpation, initial troponin negative but now elevated, continuing on heparin for ACS. Initial heparin level slightly subtherapeutic at 0.25. Hg 11.9, plt 140. D-dimer 1.05 - CTA negative. No bleeding or IV line issues per RN.  Goal of Therapy:  Heparin level 0.3-0.7 units/ml Monitor platelets by anticoagulation protocol: Yes   Plan:  Increase heparin gtt to 1050 units/hr 6h heparin level Daily heparin level/CBC Monitor for s/sx bleeding  Babs BertinHaley Waymon Laser, PharmD, BCPS Clinical Pharmacist 04/25/2017 5:55 PM

## 2017-04-25 NOTE — ED Notes (Signed)
Pt sleeping, aroused pt to adjust leads due to inaccurate apnea readings. Pt immediately went back to sleep

## 2017-04-26 ENCOUNTER — Inpatient Hospital Stay (HOSPITAL_COMMUNITY): Payer: Medicare HMO

## 2017-04-26 DIAGNOSIS — I10 Essential (primary) hypertension: Secondary | ICD-10-CM

## 2017-04-26 DIAGNOSIS — I739 Peripheral vascular disease, unspecified: Secondary | ICD-10-CM

## 2017-04-26 DIAGNOSIS — Z720 Tobacco use: Secondary | ICD-10-CM

## 2017-04-26 DIAGNOSIS — R404 Transient alteration of awareness: Secondary | ICD-10-CM

## 2017-04-26 DIAGNOSIS — E785 Hyperlipidemia, unspecified: Secondary | ICD-10-CM

## 2017-04-26 DIAGNOSIS — R4182 Altered mental status, unspecified: Secondary | ICD-10-CM

## 2017-04-26 LAB — BASIC METABOLIC PANEL
Anion gap: 7 (ref 5–15)
BUN: 13 mg/dL (ref 6–20)
CHLORIDE: 102 mmol/L (ref 101–111)
CO2: 23 mmol/L (ref 22–32)
CREATININE: 1.06 mg/dL (ref 0.61–1.24)
Calcium: 8.1 mg/dL — ABNORMAL LOW (ref 8.9–10.3)
GFR calc Af Amer: >60 mL/min (ref >60)
GFR calc non Af Amer: >60 mL/min (ref >60)
GLUCOSE: 160 mg/dL — AB (ref 65–99)
POTASSIUM: 3.9 mmol/L (ref 3.5–5.1)
Sodium: 132 mmol/L — ABNORMAL LOW (ref 135–145)

## 2017-04-26 LAB — AMMONIA: AMMONIA: 25 umol/L (ref 9–35)

## 2017-04-26 LAB — BLOOD GAS, ARTERIAL
ACID-BASE EXCESS: 1.7 mmol/L (ref 0.0–2.0)
BICARBONATE: 25.6 mmol/L (ref 20.0–28.0)
Drawn by: 36277
FIO2: 21
O2 SAT: 92.1 %
PCO2 ART: 39 mmHg (ref 32.0–48.0)
PO2 ART: 63 mmHg — AB (ref 83.0–108.0)
Patient temperature: 98.6
pH, Arterial: 7.433 (ref 7.350–7.450)

## 2017-04-26 LAB — GLUCOSE, CAPILLARY
GLUCOSE-CAPILLARY: 151 mg/dL — AB (ref 65–99)
GLUCOSE-CAPILLARY: 179 mg/dL — AB (ref 65–99)
Glucose-Capillary: 220 mg/dL — ABNORMAL HIGH (ref 65–99)
Glucose-Capillary: 191 mg/dL — ABNORMAL HIGH (ref 65–99)
Glucose-Capillary: 206 mg/dL — ABNORMAL HIGH (ref 65–99)

## 2017-04-26 LAB — HEPARIN LEVEL (UNFRACTIONATED)
HEPARIN UNFRACTIONATED: 0.22 [IU]/mL — AB (ref 0.30–0.70)
Heparin Unfractionated: 0.26 IU/mL — ABNORMAL LOW (ref 0.30–0.70)
Heparin Unfractionated: 0.54 IU/mL (ref 0.30–0.70)

## 2017-04-26 LAB — HIV ANTIBODY (ROUTINE TESTING W REFLEX): HIV Screen 4th Generation wRfx: NONREACTIVE

## 2017-04-26 LAB — RAPID URINE DRUG SCREEN, HOSP PERFORMED
AMPHETAMINES: NOT DETECTED
BARBITURATES: NOT DETECTED
Benzodiazepines: NOT DETECTED
Cocaine: NOT DETECTED
OPIATES: POSITIVE — AB
TETRAHYDROCANNABINOL: NOT DETECTED

## 2017-04-26 LAB — CBC
HCT: 34.8 % — ABNORMAL LOW (ref 39.0–52.0)
HEMOGLOBIN: 11.4 g/dL — AB (ref 13.0–17.0)
MCH: 27.2 pg (ref 26.0–34.0)
MCHC: 32.8 g/dL (ref 30.0–36.0)
MCV: 83.1 fL (ref 78.0–100.0)
Platelets: 132 10*3/uL — ABNORMAL LOW (ref 150–400)
RBC: 4.19 MIL/uL — AB (ref 4.22–5.81)
RDW: 13.7 % (ref 11.5–15.5)
WBC: 6.9 10*3/uL (ref 4.0–10.5)

## 2017-04-26 LAB — LIPID PANEL
CHOL/HDL RATIO: 4.2 ratio
Cholesterol: 140 mg/dL (ref 0–200)
HDL: 33 mg/dL — ABNORMAL LOW (ref >40)
LDL CALC: 79 mg/dL (ref 0–99)
Triglycerides: 141 mg/dL (ref <150)
VLDL: 28 mg/dL (ref 0–40)

## 2017-04-26 LAB — MAGNESIUM: MAGNESIUM: 1.9 mg/dL (ref 1.7–2.4)

## 2017-04-26 LAB — TROPONIN I: Troponin I: 0.6 ng/mL (ref <0.03)

## 2017-04-26 MED ORDER — NALOXONE HCL 0.4 MG/ML IJ SOLN: 0.4000 mg | INTRAMUSCULAR | Status: DC | PRN

## 2017-04-26 MED ORDER — NALOXONE HCL 0.4 MG/ML IJ SOLN
INTRAMUSCULAR | Status: AC
  Administered 2017-04-26: 0.4 mg
  Filled 2017-04-26: qty 1

## 2017-04-26 NOTE — Progress Notes (Signed)
Progress Note  Patient Name: Jordan Gardner Date of Encounter: 04/26/2017  Primary Cardiologist: [facility]  Subjective   Pt found unresponsive last night with normal vitals. Rapid response activated. CVA was ruled out.  He was given Narcan but unclear if this is what provoked improvement. Neuro assesed.  Sister at bedside. He is very lethargic (as he was when I evaluated him in the ED yesterday), but is able to communicate that he is without complaints.   His sister says he sleeps a lot at home as well.  Inpatient Medications    Scheduled Meds: . aspirin  324 mg Oral NOW   Or  . aspirin  300 mg Rectal NOW  . aspirin EC  81 mg Oral Daily  . atorvastatin  40 mg Oral q1800  . cilostazol  100 mg Oral BID  . insulin aspart  0-5 Units Subcutaneous QHS  . insulin aspart  0-9 Units Subcutaneous TID WC  . losartan  25 mg Oral Daily  . mirtazapine  15 mg Oral QHS  . nicotine  14 mg Transdermal Daily  . sertraline  100 mg Oral Daily  . sodium chloride flush  3 mL Intravenous Q12H   Continuous Infusions: . sodium chloride    . heparin 1,150 Units/hr (04/26/17 0107)  . nitroGLYCERIN 10 mcg/min (04/25/17 1453)   PRN Meds: sodium chloride, acetaminophen, ALPRAZolam, naLOXone (NARCAN)  injection, nitroGLYCERIN, ondansetron (ZOFRAN) IV, sodium chloride flush, zolpidem   Vital Signs    Vitals:   04/26/17 0000 04/26/17 0100 04/26/17 0300 04/26/17 0414  BP: (!) 97/57 (!) 157/76  (!) 135/55  Pulse: 71 70  72  Resp: (!) Temp:  98.5 F (36.9 C)  98.6 F (37 C)  TempSrc:  Oral    SpO2: 95% 94%  100%  Weight:   170 lb 9.6 oz (77.4 kg)   Height:        Intake/Output Summary (Last 24 hours) at 04/26/17 0754 Last data filed at 04/26/17 0500  Gross per 24 hour  Intake           976.16 ml  Output              950 ml  Net            26.16 ml   Filed Weights   04/25/17 0049 04/25/17 1658 04/26/17 0300  Weight: 166 lb (75.3 kg) 171 lb 1.6 oz (77.6 kg) 170 lb 9.6 oz  (77.4 kg)    Telemetry    nsr - Personally Reviewed  ECG    n/a - Personally Reviewed  Physical Exam   GEN: No acute distress. Sleepy. Neck: No JVD Cardiac: RRR, no murmurs, rubs, or gallops.  Respiratory: Clear to auscultation bilaterally. GI: Soft, nontender, non-distended  MS: No edema; No deformity. Neuro:  Nonfocal  Psych: Normal affect   Labs    Chemistry Recent Labs Lab 04/25/17 0100 04/26/17 0009  NA 137 132*  K 4.0 3.9  CL 106 102  CO2 23 23  GLUCOSE 194* 160*  BUN 14 13  CREATININE 1.04 1.06  CALCIUM 8.7* 8.1*  GFRNONAA >60 >60  GFRAA >60 >60  ANIONGAP 8 7     Hematology Recent Labs Lab 04/25/17 0100 04/26/17 0009  WBC 8.0 6.9  RBC 4.30 4.19*  HGB 11.9* 11.4*  HCT 36.0* 34.8*  MCV 83.7 83.1  MCH 27.7 27.2  MCHC 33.1 32.8  RDW 14.0 13.7  PLT 140* 132*  Cardiac Enzymes Recent Labs Lab 04/25/17 0433 04/25/17 1426 04/25/17 1812 04/26/17 0009  TROPONINI 0.29* 0.72* 0.63* 0.60*    Recent Labs Lab 04/25/17 0123 04/25/17 0419  TROPIPOC 0.00 0.19*     BNP Recent Labs Lab 04/25/17 0433  BNP 404.5*     DDimer  Recent Labs Lab 04/25/17 0433  DDIMER 1.05*     Radiology    Dg Chest 2 View  Result Date: 04/25/2017 CLINICAL DATA:  Chest pain radiating to the left neck and left arm. Smoker. EXAM: CHEST  2 VIEW COMPARISON:  12/23/2016 FINDINGS: Postoperative changes in the mediastinum. Shallow inspiration with elevation of the left hemidiaphragm. Linear atelectasis or fibrosis in the left lung base and left mid lung is similar to previous study. No airspace disease or consolidation in the lungs. No blunting of costophrenic angles. No pneumothorax. Calcification of the aorta. IMPRESSION: Elevation of the left hemidiaphragm with linear fibrosis or atelectasis in the left mid and lower lungs similar previous study. No evidence of active pulmonary disease. Electronically Signed   By: Burman Nieves M.D.   On: 04/25/2017 02:02   Ct  Head Wo Contrast  Result Date: 04/26/2017 CLINICAL DATA:  RIGHT facial droop. Last seen normal at 8 p.m. History of hypertension and diabetes. EXAM: CT HEAD WITHOUT CONTRAST TECHNIQUE: Contiguous axial images were obtained from the base of the skull through the vertex without intravenous contrast. COMPARISON:  CT HEAD Dec 23, 2016 FINDINGS: BRAIN: No intraparenchymal hemorrhage, mass effect nor midline shift. Old RIGHT basal ganglia infarct with ex vacuo dilatation subjacent ventricle. Mild parenchymal brain volume loss, advanced for age. No hydrocephalus. Patchy supratentorial white matter hypodensities including LEFT frontal subcortical white matter. No abnormal extra-axial fluid collections. Basal cisterns are patent. VASCULAR: Moderate calcific atherosclerosis of the carotid siphons. SKULL: No skull fracture. No significant scalp soft tissue swelling. SINUSES/ORBITS: Moderate pan paranasal sinusitis. Mastoid air cells are well aerated.The included ocular globes and orbital contents are non-suspicious. OTHER: Poor dentition. ASPECTS [facility] Stroke Program Early CT Score) - Ganglionic level infarction (caudate, lentiform nuclei, internal capsule, insula, M1-M3 cortex): 7 - Supraganglionic infarction (M4-M6 cortex): 3 Total score (0-10 with 10 being normal): 10 IMPRESSION: 1. No acute intracranial process. 2. Mild to moderate chronic small vessel ischemic disease, worse within LEFT frontal lobe. 3. Old RIGHT basal ganglia infarct. 4. Moderate parenchymal brain volume loss for age. 5. ASPECTS is 10. 6. Critical Value/emergent results were called by telephone at the time of interpretation on 04/26/2017 at 12:54 am to Dr. Amada Jupiter, Neurology , who verbally acknowledged these results. Electronically Signed   By: Awilda Metro M.D.   On: 04/26/2017 00:55   Dg Abd 2 Views  Result Date: 04/25/2017 CLINICAL DATA:  No bowel movement for over a week. History of diabetes and hypertension. EXAM: ABDOMEN - 2 VIEW  COMPARISON:  None. FINDINGS: Gas and stool throughout the colon. No small or large bowel distention. No free intra-abdominal air. No abnormal air-fluid levels. No radiopaque stones. Vascular calcifications. Degenerative changes in the spine and hips. IMPRESSION: Nonobstructive bowel gas pattern with stool-filled colon. Electronically Signed   By: Burman Nieves M.D.   On: 04/25/2017 04:02   Ct Angio Chest/abd/pel For Dissection W And/or Wo Contrast  Result Date: 04/25/2017 CLINICAL DATA:  Chest pain extending to the left side of the neck and left arm starting around 10 p.m. last night. EXAM: CT ANGIOGRAPHY CHEST, ABDOMEN AND PELVIS TECHNIQUE: Multidetector CT imaging through the chest, abdomen and pelvis was performed using the  standard protocol during bolus administration of intravenous contrast. Multiplanar reconstructed images and MIPs were obtained and reviewed to evaluate the vascular anatomy. CONTRAST:  100 mL Isovue 370 COMPARISON:  None. FINDINGS: CTA CHEST FINDINGS Cardiovascular: Noncontrast images of the chest demonstrate postoperative changes in the mediastinum with sternotomy wires and surgical clips and vascular markers present. Calcification of the aorta. No evidence of intramural hematoma. Images obtained during arterial phase after contrast administration demonstrate mild aortic ectasia with ascending aorta measuring 3.8 cm diameter. No evidence of aortic dissection. Irregularity of the aortic wall in the arch region is consistent with atherosclerotic change. Focal plaque ulceration is suspected. Great vessel origins are patent. Calcification at the origin of the left subclavian artery results and moderate stenosis all the vessel remains patent. Normal opacification of the central and segmental pulmonary arteries. No evidence of pulmonary embolus. Normal heart size. No pericardial effusion. Mediastinum/Nodes: No significant lymphadenopathy in the chest. Esophagus is decompressed. Lungs/Pleura:  Atelectasis or infiltrations demonstrated in both lung bases. Emphysematous changes throughout the lungs. No consolidation. Airways thickening likely representing chronic bronchitis. No pleural effusions. No pneumothorax. Irregular nodule in the right lung apex measuring 6 mm diameter. Musculoskeletal: Degenerative changes throughout the thoracic spine. No vertebral compression deformities. No destructive bone lesions. Review of the MIP images confirms the above findings. CTA ABDOMEN AND PELVIS FINDINGS VASCULAR Aorta: Diffuse calcific and noncalcific atherosclerotic change throughout the abdominal aorta and iliac arteries. Mural thrombus is present. Changes result in moderate stenosis of the distal abdominal aorta. No aneurysm or dissection. Celiac: Origin of the celiac axis is pinpoint suggesting significant stenosis. Reconstitution of flow from collateral vessels. SMA: Superior mesenteric artery is patent with scattered calcification. Renals: Single bilateral renal arteries. Significant stenosis is suggested in the renal artery origins bilaterally but the vessels remain patent. Nephrograms are symmetrical. IMA: Inferior mesenteric artery is patent. Inflow: Calcific and noncalcific atherosclerotic changes in both common iliac artery's resulting in moderate luminal narrowing. 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 Diffuse disease throughout the external iliac and internal iliac arteries bilaterally. High-grade stenosis of the origin and midportion of the right external iliac artery and of the distal left external iliac artery with the vessels remain patent. Occlusion of the right internal and left internal iliac arteries. Stenosis of both common femoral arteries. Veins: No obvious venous abnormality within the limitations of this arterial phase study. Review of the MIP images confirms the above findings. NON-VASCULAR Hepatobiliary: No focal liver abnormality is seen. No gallstones, gallbladder wall  thickening, or biliary dilatation. Pancreas: Unremarkable. No pancreatic ductal dilatation or surrounding inflammatory changes. Spleen: Normal in size without focal abnormality. Adrenals/Urinary Tract: Adrenal glands are unremarkable. Kidneys are normal, without renal calculi, focal lesion, or hydronephrosis. Bladder is unremarkable. Stomach/Bowel: Stomach is within normal limits. Appendix appears normal. No evidence of bowel wall thickening, distention, or inflammatory changes. Lymphatic: No significant lymphadenopathy. Reproductive: Prostate is unremarkable. Other: No free air or free fluid in the abdomen. Abdominal wall musculature appears intact. Musculoskeletal: Degenerative changes in the spine. No destructive bone lesions. Review of the MIP images confirms the above findings. IMPRESSION: 1. No evidence of thoracic or abdominal aortic dissection. No evidence of significant pulmonary embolus. 2. Extensive aortic and branch vessel atherosclerosis with calcific and noncalcific atherosclerotic changes. 3. Ulcerated plaque formation demonstrated in the aortic arch. 4. Moderate stenosis of the distal abdominal aorta. High-grade stenosis of the celiac axis with reconstitution of flow via collaterals. Moderate stenosis of both renal artery origins. Areas of stenosis  demonstrated in both common iliac arteries, both external iliac arteries, both common femoral arteries, and occlusion of both internal iliac arteries. 5. Diffuse emphysematous changes in the lungs. Chronic bronchitic changes. Atelectasis or infiltration demonstrated in both lung bases. Aortic Atherosclerosis (ICD10-I70.0) and Emphysema (ICD10-J43.9). Electronically Signed   By: Burman Nieves M.D.   On: 04/25/2017 06:44    Cardiac Studies   Echocardiogram pending  Patient Profile     66 y.o. male with h/o CAD and CABG in 2012 or 2013 who presented with chest pain and accelerated hypertension with clinical picture suggestive of  NSTEMI.  Assessment & Plan    1. NSTEMI: Troponins peaked at 0.72, down to 0.6. He is asymptomatic. Unclear if troponin elevation was due to supply demand mismatch in the context of severe hypertension vs obstructive coronary ischemic disease. BP normal on IV nitroglycerin and losartan. Consider nuclear stress test vs cath on 9/10 depending upon how he does over the next 24 hrs. Continue ASA, Lipitor, IV heparin, and ARB. Beta blocker on hold due to HR in low 50 bpm range on admission.  2. Accelerated hypertension: BP normal on IV nitroglycerin and losartan. No changes for now.  3. Hyperlipidemia: Continue statin.  4. Altered mental status: Unclear etiology. Neuro has evaluated and does not believe this represents an acute neurologic ischemic insult. Head CT reviewed and showed chronic small vessel ischemic disease, moreso in left frontal lobe and old right basal ganglia infarct.  5. Tobacco abuse disorder: Smokes 2 ppd.  6. PAD:  Continue ASA and statin. CT angiogram reviewed above with no PE or dissection but diffuse atherosclerotic plaque, distal abdominal aortic stenosis, b/l renal artery stenosis, and significant PAD.Needs tobacco cessation.       For questions or updates, please contact [facility] Please consult [email or web address] for contact info under Cardiology/STEMI. Daytime calls, contact the Day Call APP (6a-8a) or assigned team (Teams A-D) provider (7:30a - 5p). All other daytime calls (7:30-5p), contact the Card Master @ [phone].   Nighttime calls, contact the assigned APP (5p-8p) or MD (6:30p-8p). Overnight calls (8p-6a), contact the on call Fellow @ [phone].      Signed, Prentice Docker, MD  04/26/2017, 7:54 AM

## 2017-04-26 NOTE — Progress Notes (Signed)
Patient was found unresponsive at 0010. Sternal rub performed with no response. All vital wnl. CBG 151, rapid response Rn notified and cardiology came to assess patient,  Narcan given, code stroke called.  head ct done,  neurologist assessed also.  Patient came around began to follow commands.  Heparin gtts rate adjusted per pharmacy, nitro continued at 93ml/hr . Will follow Q2 hours neuor checks.

## 2017-04-26 NOTE — Progress Notes (Signed)
Patient eating chocolate pudding, feeding self swallowing without any difficulty, no aspiration.

## 2017-04-26 NOTE — Consult Note (Signed)
Neurology Consultation Reason for Consult: unresponsiveness Referring Physician: Dr. Virgina Organ  CC: unresponsiveness  History is obtained from: Nursing  HPI: Jordan Gardner is a 66 y.o. male with a history of HTN, DM who was admitted with NSTEMI yesterday. He was last seen by the nurse around 8:30pm when he was finishing dinner. He was then noticed after midnight that he was unresponsive. Rapid response was notified and a code stroke was activated. During the initial assessment by rapid response where he was unresponsive to sternal rub, painful stimuli. He then began to slowly improve, following commands. He was given Narcan, but it was unclear that this was what provoked improvement.  At the time of my initial assessment, he was severely lethargic but following commands and answering some simple questions. Despite not reliably answer my questions, he began talking fluently without dysarthria.  He continued to improve and was much more aware of the time he returned to the unit. CT was negative.   LKW: 8:30 pm tpa given?: no, out of window, not clearly a stroke    ROS:  Unable to obtain due to altered mental status.   Past Medical History:  Diagnosis Date  . Coronary artery disease   . Diabetes mellitus without complication (HCC)   . Hypertension     Family History  Problem Relation Age of Onset  . Hypertension Sister    Social History:  reports that he has been smoking.  He has been smoking about 2.00 packs per day. He has never used smokeless tobacco. He reports that he does not drink alcohol or use drugs.   Exam: Current vital signs: BP (!) 155/77   Pulse 80   Temp 97.7 F (36.5 C)   Resp 19   Ht  (1.854 m)   Wt 77.6 kg (171 lb 1.6 oz)   SpO2 93%   BMI 22.57 kg/m  Vital signs in last 24 hours: Temp:  [97.5 F (36.4 C)-97.7 F (36.5 C)] 97.7 F (36.5 C) (09/08 2046) Pulse Rate:  [50-80] 80 (09/08 2046) Resp:  [5-21] 19 (09/08 2046) BP: (138-196)/(72-94)  155/77 (09/08 2046) SpO2:  [93 %-99 %] 93 % (09/08 2046) Weight:  [77.6 kg (171 lb 1.6 oz)] 77.6 kg (171 lb 1.6 oz) (09/08 1658)   Physical Exam  Constitutional: Appears well-developed and well-nourished.  Psych: Affect appropriate to situation Eyes: No scleral injection HENT: No OP obstrucion, Neck is supple Head: Normocephalic.  Cardiovascular: Normal rate and regular rhythm.  Respiratory: Effort normal and breath sounds normal to anterior ascultation GI: Soft.  No distension. There is no tenderness.  Skin: WDI  Neuro: Mental Status: Patient is awake, alert, oriented to person, Does not answer other questions initially, becomes more responsive over the course of the exam. Cranial Nerves: II: Visual Fields are full. Pupils are equal, round, and reactive to light.   III,IV, VI: EOMI without ptosis or diploplia.  V: Facial sensation is symmetric to temperature VII: Facial movement is symmetric.  VIII: hearing is intact to voice X: Uvula elevates symmetrically XI: Shoulder shrug is symmetric. XII: tongue is midline without atrophy or fasciculations.  Motor: Tone is normal. Bulk is normal. He is able to hold both arms up without drift, he gives very poor effort in bilateral lower extremities. Sensory: He responds to noxious stimuli in all 4 extremities Cerebellar: Does not perform  I have reviewed labs in epic and the results pertinent to this consultation are: Sodium 132, glucose 160  I have reviewed the images  obtained: CT head-unremarkable  Impression: 66 year old male with episode of unresponsiveness of unclear etiology. There are no lateralizing signs to suggest cerebral ischemia as an etiology for this. I do not favor IV TPA given that he is improving and there is no lateralizing finding to support this being a stroke.  Recommendations: 1) ABG, ammonia 2) EEG, MRI 3) neuro checks 4) will follow.    Ritta SlotMcNeill Zakiyah Diop, MD [facility] [phone]  If 7pm- 7am, please page neurology on call as listed in AMION.

## 2017-04-26 NOTE — Progress Notes (Signed)
ANTICOAGULATION CONSULT NOTE  Pharmacy Consult for heparin Indication: chest pain/ACS  No Known Allergies  Patient Measurements: Height: 6\' 1"  (185.4 cm) Weight: 170 lb 9.6 oz (77.4 kg) IBW/kg (Calculated) : 79.9  Vital Signs: Temp: 98.3 F (36.8 C) (09/09 1117) Temp Source: Oral (09/09 1117) BP: 101/63 (09/09 1117) Pulse Rate: 74 (09/09 1117)  Labs:  Recent Labs  04/25/17 0100  04/25/17 1426 04/25/17 1812 04/26/17 0009 04/26/17 0724 04/26/17 1422  HGB 11.9*  --   --   --  11.4*  --   --   HCT 36.0*  --   --   --  34.8*  --   --   PLT 140*  --   --   --  132*  --   --   HEPARINUNFRC  --   < > 0.25*  --  0.22* 0.26* 0.54  CREATININE 1.04  --   --   --  1.06  --   --   TROPONINI <0.03  < > 0.72* 0.63* 0.60*  --   --   < > = values in this interval not displayed.  Estimated Creatinine Clearance: 76.1 mL/min (by C-G formula based on SCr of 1.06 mg/dL).   Medical History: Past Medical History:  Diagnosis Date  . Coronary artery disease   . Diabetes mellitus without complication (HCC)   . Hypertension     Assessment: 66yo male c/o left CP, worse w/ movement/palpation, initial troponin negative but now elevated, continuing on heparin for ACS. Heparin level is slightly above goal. No bleeding noted.   Goal of Therapy:  Heparin level 0.3-0.5 units/ml Monitor platelets by anticoagulation protocol: Yes   Plan:  Increase heparin gtt to 1150 units/hr F/u AM heparin level  Lysle Pearlachel Danay Mckellar, PharmD, BCPS 04/26/2017 3:41 PM

## 2017-04-26 NOTE — Progress Notes (Signed)
Date of recording 04/26/2017  Referring physician Christa SeeMcNeal Kirkpatrick  Technical Digital EEG recording using 10-international electrode system  Reason for the study 66 year old male presented with unresponsiveness  Description of the recording When awake posterior dominant rhythm is 6-7 Hz which is symmetrical and reactive. Epileptiform features was not seen during this recording Sleep architecture was not observed  Impression  The EEG is abnormal and findings are suggestive of Nonspecific generalized cerebral dysfunction. Epileptiform activity was not seen during this recording.

## 2017-04-26 NOTE — Progress Notes (Signed)
ANTICOAGULATION CONSULT NOTE - Follow Up Consult  Pharmacy Consult for heparin Indication: NSTEMI  Labs:  Recent Labs  04/25/17 0100  04/25/17 1426 04/25/17 1812 04/26/17 0009  HGB 11.9*  --   --   --  11.4*  HCT 36.0*  --   --   --  34.8*  PLT 140*  --   --   --  132*  HEPARINUNFRC  --   --  0.25*  --  0.22*  CREATININE 1.04  --   --   --  1.06  TROPONINI <0.03  < > 0.72* 0.63* 0.60*  < > = values in this interval not displayed.   Assessment: 66yo male now w/ lower heparin level despite increased rate; of note code stroke was just called on pt, spoke w/ neuro who agrees with resuming heparin but aiming for lower goal.  Goal of Therapy:  Heparin level 0.3-0.5 units/ml   Plan:  Will increase heparin gtt slightly to 1150 units/hr and check level in 6hr.  Vernard GamblesVeronda Zacherie Honeyman, PharmD, BCPS  04/26/2017,1:06 AM

## 2017-04-26 NOTE — Progress Notes (Signed)
ANTICOAGULATION CONSULT NOTE - Follow Up Consult  Pharmacy Consult for heparin Indication: NSTEMI  Labs:  Recent Labs  04/25/17 0100  04/25/17 1426 04/25/17 1812 04/26/17 0009 04/26/17 0724  HGB 11.9*  --   --   --  11.4*  --   HCT 36.0*  --   --   --  34.8*  --   PLT 140*  --   --   --  132*  --   HEPARINUNFRC  --   --  0.25*  --  0.22* 0.26*  CREATININE 1.04  --   --   --  1.06  --   TROPONINI <0.03  < > 0.72* 0.63* 0.60*  --   < > = values in this interval not displayed.   Assessment: 66yo male with subtherapeutic heparin level of 0.26; of note code stroke was called on patient earlier this morning, spoke with neuro who agrees with resuming heparin but aiming for lower heparin goal of 0.3-0.5. CBC stable with no signs/sx of bleeding per RN.  Goal of Therapy:  Heparin level 0.3-0.5 units/ml   Plan:  Increase heparin slightly to 1200 units/hr.  Order 6 hour heparin level Daily heparin level, CBC Monitor clinical course, s/sx bleeding  Donnella Biyler Marvia Troost, PharmD PGY1 Acute Care Pharmacy Resident Pager: [phone] 04/26/2017 9:15 AM

## 2017-04-26 NOTE — Progress Notes (Signed)
EEG completed; results pending.    

## 2017-04-26 NOTE — Significant Event (Addendum)
Rapid Response Event Note  Overview: Time Called: 0015 Arrival Time: 0015 Event Type: Neurologic, Unknown  Initial Focused Assessment: While I was rounding, RN asked me to assess patient for acute neuro changes. Upon arrival, patient was unresponsive, patient did not respond to voice, painful stimuli, or to command.  Blood sugar was 151, pupils are reactive, patient did have a facial droop to the right, unable to tell if its not new or old.  RN paged CARDS MD, heparin and NTG drip were stopped, Code Stroke called per Dr. Virgina OrganQureshi. Patient did slowly come around prior to MD arrival, I did give 0.4 Narcan IV.  Patient taken to CT for STAT HEAD CT, Dr. Amada JupiterKirkpatrick assess patient in CT.   After CT, patient was more responsive, did verbalizes of having some generalized pain, also stated that sometimes he is in a deep sleep state baseline. SBP 120-140s, HR in the 60-70s, SpO2 > 93% RA.   Interventions: - Code Stroke called at 0031 - 0.4 Narcan IV given at 0030 - Taken down for CT HEAD - Q2H Neuro checks - NTG and Heparin restarted - CT: Mild to moderate chronic small vessel ischemic disease, worse within LEFT frontal lobe, old RIGHT basal ganglia infarct. - ABG: normal, except PO2 of 63, will place patient on 2L [place].  Plan of Care (if not transferred): - Will follow as needed  Event Summary: Name of Physician Notified: Dr. Virgina OrganQureshi by Primary RN  at 0020  Name of Consulting Physician Notified: Dr. Neurology (Code Stroke) at 0031  Outcome: Stayed in room and stabalized  Event End Time: 0120  Marlene Beidler R

## 2017-04-27 ENCOUNTER — Inpatient Hospital Stay (HOSPITAL_COMMUNITY): Payer: Medicare HMO

## 2017-04-27 ENCOUNTER — Encounter (HOSPITAL_COMMUNITY): Payer: Self-pay | Admitting: Family Medicine

## 2017-04-27 DIAGNOSIS — I10 Essential (primary) hypertension: Secondary | ICD-10-CM

## 2017-04-27 DIAGNOSIS — I214 Non-ST elevation (NSTEMI) myocardial infarction: Principal | ICD-10-CM

## 2017-04-27 DIAGNOSIS — L97509 Non-pressure chronic ulcer of other part of unspecified foot with unspecified severity: Secondary | ICD-10-CM

## 2017-04-27 DIAGNOSIS — R5381 Other malaise: Secondary | ICD-10-CM

## 2017-04-27 DIAGNOSIS — L89159 Pressure ulcer of sacral region, unspecified stage: Secondary | ICD-10-CM

## 2017-04-27 DIAGNOSIS — E782 Mixed hyperlipidemia: Secondary | ICD-10-CM

## 2017-04-27 DIAGNOSIS — Z720 Tobacco use: Secondary | ICD-10-CM

## 2017-04-27 DIAGNOSIS — I5030 Unspecified diastolic (congestive) heart failure: Secondary | ICD-10-CM

## 2017-04-27 DIAGNOSIS — L8990 Pressure ulcer of unspecified site, unspecified stage: Secondary | ICD-10-CM | POA: Insufficient documentation

## 2017-04-27 DIAGNOSIS — E43 Unspecified severe protein-calorie malnutrition: Secondary | ICD-10-CM

## 2017-04-27 DIAGNOSIS — R4182 Altered mental status, unspecified: Secondary | ICD-10-CM

## 2017-04-27 DIAGNOSIS — I739 Peripheral vascular disease, unspecified: Secondary | ICD-10-CM

## 2017-04-27 LAB — GLUCOSE, CAPILLARY
GLUCOSE-CAPILLARY: 137 mg/dL — AB (ref 65–99)
GLUCOSE-CAPILLARY: 226 mg/dL — AB (ref 65–99)
GLUCOSE-CAPILLARY: 264 mg/dL — AB (ref 65–99)
Glucose-Capillary: 118 mg/dL — ABNORMAL HIGH (ref 65–99)

## 2017-04-27 LAB — CBC
HEMATOCRIT: 33.4 % — AB (ref 39.0–52.0)
HEMOGLOBIN: 10.7 g/dL — AB (ref 13.0–17.0)
MCH: 27 pg (ref 26.0–34.0)
MCHC: 32 g/dL (ref 30.0–36.0)
MCV: 84.1 fL (ref 78.0–100.0)
Platelets: 147 10*3/uL — ABNORMAL LOW (ref 150–400)
RBC: 3.97 MIL/uL — ABNORMAL LOW (ref 4.22–5.81)
RDW: 13.6 % (ref 11.5–15.5)
WBC: 7.1 10*3/uL (ref 4.0–10.5)

## 2017-04-27 LAB — HEPARIN LEVEL (UNFRACTIONATED): Heparin Unfractionated: 0.49 IU/mL (ref 0.30–0.70)

## 2017-04-27 LAB — ECHOCARDIOGRAM COMPLETE
HEIGHTINCHES: 73 in
WEIGHTICAEL: 2800 [oz_av]

## 2017-04-27 MED ORDER — SODIUM CHLORIDE 0.9 % IV SOLN
250.0000 mL | INTRAVENOUS | Status: DC | PRN
  Administered 2017-04-28: 250 mL via INTRAVENOUS

## 2017-04-27 MED ORDER — ASPIRIN EC 81 MG PO TBEC
81.0000 mg | DELAYED_RELEASE_TABLET | Freq: Every day | ORAL | Status: DC
  Administered 2017-04-29: 81 mg via ORAL
  Filled 2017-04-27: qty 1

## 2017-04-27 MED ORDER — SODIUM CHLORIDE 0.9% FLUSH: 3.0000 mL | INTRAVENOUS | Status: DC | PRN

## 2017-04-27 MED ORDER — ASPIRIN 81 MG PO CHEW
81.0000 mg | CHEWABLE_TABLET | ORAL | Status: AC
  Administered 2017-04-28: 81 mg via ORAL
  Filled 2017-04-27: qty 1

## 2017-04-27 MED ORDER — SODIUM CHLORIDE 0.9 % WEIGHT BASED INFUSION
3.0000 mL/kg/h | INTRAVENOUS | Status: AC
  Administered 2017-04-28: 3 mL/kg/h via INTRAVENOUS

## 2017-04-27 MED ORDER — SODIUM CHLORIDE 0.9 % WEIGHT BASED INFUSION: 1.0000 mL/kg/h | INTRAVENOUS | Status: DC

## 2017-04-27 MED ORDER — SODIUM CHLORIDE 0.9% FLUSH
3.0000 mL | Freq: Two times a day (BID) | INTRAVENOUS | Status: DC
  Administered 2017-04-28: 3 mL via INTRAVENOUS

## 2017-04-27 NOTE — Consult Note (Signed)
Medical Consultation   Jordan Gardner  ONG:[id number]  DOB: 12/14/1950  DOA: 04/25/2017  PCP: Patient, No Pcp Per   Outpatient Specialists:  [facility]  Requesting physician: Cardiology - Annabelle Harman PA.  Reason for consultation: Somnolence, odd affect and general medical management   History of Present Illness: Jordan Gardner is an 66 y.o. male CAD, diabetes, hypertension. Patient was admitted on 04/25/2017 due to concerns for CAD/NSTEMI with a presenting complaint of chest pain and hypertensive urgency. 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 Patient smokes 1-2 packs of cigarettes per day. On subsequent days patient has continued to be somnolent and this engaged in conversation. He has been continued on IV nitroglycerin, the olmesartan, IV heparin, by mouth ASA and Lipitor. Patient currently is chest pain-free. Patient's only complaint is right great toe pain which has been persistent for several months. Additionally patient states that when he ambulates his legs ache. Patient really denies any palpitations, shortness breath, nausea, vomiting, dumping, dysuria, fevers, neck stiffness, focal neurological deficit.  Patient states that for several months now he has essentially laying in bed throughout the day watching TV and not participating in typical activities of daily living. Patient states that his sister cooks his meals for him and does laundry. Patient states that he is able to get up and take care of his toileting and to be able to get his food but otherwise stays in bed. Patient has very little motivation to get up and get going. Patient states that his skin over his lower back is ache now for a couple of months.  Review of Systems:  ROS As per HPI otherwise all other systems reviewed and are negative   Past Medical History: Past Medical History:  Diagnosis Date  . Coronary artery disease   . Diabetes mellitus without complication (HCC)     . Hypertension   . Psychotic affective disorder (HCC)    Per sister. Unsure of Dx. Pt sleeps late and difficult to wake. odd affect    Past Surgical History: Past Surgical History:  Procedure Laterality Date  . CARDIAC CATHETERIZATION    . CARDIAC SURGERY     CABG x5 2012 - [facility]     Allergies:  No Known Allergies   Social History:  reports that he has been smoking.  He has been smoking about 2.00 packs per day. He has never used smokeless tobacco. He reports that he does not drink alcohol or use drugs.   Family History: Family History  Problem Relation Age of Onset  . Hypertension Sister      Physical Exam: Vitals:   04/27/17 0500 04/27/17 0502 04/27/17 0900 04/27/17 1338  BP: (!) 145/82  138/74 90/62  Pulse: 79  63 89  Resp: (!) Temp:  (!) 97.2 F (36.2 C)  98 F (36.7 C)  TempSrc:  Axillary  Oral  SpO2: 98%  97% 96%  Weight:      Height:        General:  Appears calm and comfortable Eyes:  PERRL, EOMI, normal lids, iris ENT:  grossly normal hearing, lips & tongue, mmm Neck:  no LAD, masses or thyromegaly Cardiovascular:  RRR, no m/r/g. No LE edema.  Respiratory:  CTA bilaterally, no w/r/r. Normal respiratory effort. Abdomen:  soft, ntnd, NABS Skin: 1x1 cm circumferential ulcerative lesion at the tip of the right great toe. No  other rashes appreciated. Musculoskeletal:  grossly normal tone BUE/BLE, good ROM, no bony abnormality Psychiatric: Somewhat flattened affect and not overly engaging. Follows basic commands. Alert and oriented 4. Patient given her standing with regards to his current hospitalization and ongoing workup.  Neurologic:  CN 2-12 grossly intact, moves all extremities in coordinated fashion, sensation intact  Data reviewed:  I have personally reviewed following labs and imaging studies Labs:  CBC:  Recent Labs Lab 04/25/17 0100 04/26/17 0009 04/27/17 0152  WBC 8.0 6.9 7.1  HGB 11.9* 11.4* 10.7*  HCT 36.0* 34.8* 33.4*   MCV 83.7 83.1 84.1  PLT 140* 132* 147*    Basic Metabolic Panel:  Recent Labs Lab 04/25/17 0100 04/25/17 1426 04/26/17 0009  NA 137  --  132*  K 4.0  --  3.9  CL 106  --  102  CO2 23  --  23  GLUCOSE 194*  --  160*  BUN 14  --  13  CREATININE 1.04  --  1.06  CALCIUM 8.7*  --  8.1*  MG  --  1.4* 1.9   GFR Estimated Creatinine Clearance: 78 mL/min (by C-G formula based on SCr of 1.06 mg/dL). Liver Function Tests: No results for input(s): AST, ALT, ALKPHOS, BILITOT, PROT, ALBUMIN in the last 168 hours. No results for input(s): LIPASE, AMYLASE in the last 168 hours.  Recent Labs Lab 04/26/17 0234  AMMONIA 25   Coagulation profile No results for input(s): INR, PROTIME in the last 168 hours.  Cardiac Enzymes:  Recent Labs Lab 04/25/17 0100 04/25/17 0433 04/25/17 1426 04/25/17 1812 04/26/17 0009  TROPONINI <0.03 0.29* 0.72* 0.63* 0.60*   BNP: Invalid input(s): POCBNP CBG:  Recent Labs Lab 04/26/17 1106 04/26/17 1705 04/26/17 2143 04/27/17 0729 04/27/17 1121  GLUCAP 179* 191* 206* 137* 118*   D-Dimer  Recent Labs  04/25/17 0433  DDIMER 1.05*   Hgb A1c  Recent Labs  04/25/17 1426  HGBA1C 7.4*   Lipid Profile  Recent Labs  04/26/17 0009  CHOL 140  HDL 33*  LDLCALC 79  TRIG 161  CHOLHDL 4.2   Thyroid function studies  Recent Labs  04/25/17 1426  TSH 4.517*   Anemia work up No results for input(s): VITAMINB12, FOLATE, FERRITIN, TIBC, IRON, RETICCTPCT in the last 72 hours. Urinalysis    Component Value Date/Time   COLORURINE YELLOW 12/23/2016 1900   APPEARANCEUR CLEAR 12/23/2016 1900   LABSPEC 1.014 12/23/2016 1900   PHURINE 5.0 12/23/2016 1900   GLUCOSEU NEGATIVE 12/23/2016 1900   HGBUR SMALL (A) 12/23/2016 1900   BILIRUBINUR NEGATIVE 12/23/2016 1900   KETONESUR NEGATIVE 12/23/2016 1900   PROTEINUR 30 (A) 12/23/2016 1900   NITRITE NEGATIVE 12/23/2016 1900   LEUKOCYTESUR NEGATIVE 12/23/2016 1900      Microbiology Recent Results (from the past 240 hour(s))  MRSA PCR Screening     Status: None   Collection Time: 04/25/17  5:05 PM  Result Value Ref Range Status   MRSA by PCR NEGATIVE NEGATIVE Final    Comment:        The GeneXpert MRSA Assay (FDA approved for NASAL specimens only), is one component of a comprehensive MRSA colonization surveillance program. It is not intended to diagnose MRSA infection nor to guide or monitor treatment for MRSA infections.        Inpatient Medications:   Scheduled Meds: . aspirin EC  81 mg Oral Daily  . atorvastatin  40 mg Oral q1800  . cilostazol  100 mg Oral BID  .  insulin aspart  0-5 Units Subcutaneous QHS  . insulin aspart  0-9 Units Subcutaneous TID WC  . losartan  25 mg Oral Daily  . mirtazapine  15 mg Oral QHS  . nicotine  14 mg Transdermal Daily  . sertraline  100 mg Oral Daily  . sodium chloride flush  3 mL Intravenous Q12H   Continuous Infusions: . sodium chloride    . heparin 1,150 Units/hr (04/27/17 0210)  . nitroGLYCERIN 10 mcg/min (04/26/17 1642)     Radiological Exams on Admission: Ct Head Wo Contrast  Result Date: 04/26/2017 CLINICAL DATA:  RIGHT facial droop. Last seen normal at 8 p.m. History of hypertension and diabetes. EXAM: CT HEAD WITHOUT CONTRAST TECHNIQUE: Contiguous axial images were obtained from the base of the skull through the vertex without intravenous contrast. COMPARISON:  CT HEAD Dec 23, 2016 FINDINGS: BRAIN: No intraparenchymal hemorrhage, mass effect nor midline shift. Old RIGHT basal ganglia infarct with ex vacuo dilatation subjacent ventricle. Mild parenchymal brain volume loss, advanced for age. No hydrocephalus. Patchy supratentorial white matter hypodensities including LEFT frontal subcortical white matter. No abnormal extra-axial fluid collections. Basal cisterns are patent. VASCULAR: Moderate calcific atherosclerosis of the carotid siphons. SKULL: No skull fracture. No significant scalp  soft tissue swelling. SINUSES/ORBITS: Moderate pan paranasal sinusitis. Mastoid air cells are well aerated.The included ocular globes and orbital contents are non-suspicious. OTHER: Poor dentition. ASPECTS [facility]([place] Stroke Program Early CT Score) - Ganglionic level infarction (caudate, lentiform nuclei, internal capsule, insula, M1-M3 cortex): 7 - Supraganglionic infarction (M4-M6 cortex): 3 Total score (0-10 with 10 being normal): 10 IMPRESSION: 1. No acute intracranial process. 2. Mild to moderate chronic small vessel ischemic disease, worse within LEFT frontal lobe. 3. Old RIGHT basal ganglia infarct. 4. Moderate parenchymal brain volume loss for age. 5. ASPECTS is 10. 6. Critical Value/emergent results were called by telephone at the time of interpretation on 04/26/2017 at 12:54 am to Dr. Amada JupiterKirkpatrick, Neurology , who verbally acknowledged these results. Electronically Signed   By: Awilda Metroourtnay  Bloomer M.D.   On: 04/26/2017 00:55    Impression/Recommendations Active Problems:   NSTEMI (non-ST elevated myocardial infarction) (HCC)   Accelerated hypertension   Hyperlipidemia   Tobacco abuse disorder   Altered mental status   PAD (peripheral artery disease) (HCC)   Pressure injury of skin   Protein calorie malnutrition (HCC)   Physical deconditioning   Ulcer of great toe (HCC)   Claudication (HCC)   Sacral decubitus ulcer   NSTEMI:  - mgt per primary team - likely Cath on 04/27/17.  Somnolence and odd Affect: Pts current mental state is his baseline. Pt has some kind of undiagnosed mental disorder (per pts sister who is his caretaker). He sleeps late and is very difficult to awake. He routinely does not engage in conversation or participate in his own medical management. [facility]. Doubt infectious process, medication induced condition (No change w/ Narcan and has only received Morhpine 4mg  x1 on 9/8), metabolic, or acute intracranial process. Pt evaluated by  Neuro and had EEG performed w/o evidence of Szr.  - Monitor.  - Use sedating medications sparingly - Continue zoloft, remeron  Physical deconditioning/protein calorie malnutrition: Patient endorses several month history of essentially laying in bed throughout the day and night minimal ambulatory efforts for basic toileting and ADLs. Patient's sister performs all of his cooking cleaning and laundry needs. Patient states he lacks the motivation denies any depression, HI, SI. Patient states that when  he exerts himself he does get very short of breath and fatigue. Patient states that he developed a bedsore approximately 2 months ago that he had treated at the [place]. - encourage ambulation, PT/OT/Nutrition - Prealbumin - air overlay - continue Duoderm for decubitus  R great toe pain: ongoing for months per pt. 1cm ulceration at the tip of the 1st great toe. Pt w/ no sensation at time of my examination. Digits warm. Barely plapable dorsalis pedis pulses bilaterally w/ nonexistant posterior tib pulses.  - ABI - contineu home pletal  Tobacco use: smokes 1-2ppd. - Nicotine patch  DM: A1c 7.4 on 04/25/17. - continue SSI.   Thank you for this consultation.  Our [facility] hospitalist team will follow the patient with you.   Cambre Matson J M.D. [facility] 04/27/2017, 1:45 PM

## 2017-04-27 NOTE — Progress Notes (Addendum)
Progress Note  Patient Name: Jordan Gardner Date of Encounter: 04/27/2017  Primary Cardiologist: [facility]  Subjective   Nurse this AM reports he has been generally lethargic for the shift, as is corroborated in prior notes. She also reports pressure ulcer on patient's bottom. His affect is strange, seems selective about answers. Answers some but not others. Denies any CP or SOB.  When asked where he is, [place]" -> "[place]" -> "[address] [place] [address]" -> "[place]" -> "[facility]" (required several questions to arrive at answer.  Inpatient Medications    Scheduled Meds: . aspirin EC  81 mg Oral Daily  . atorvastatin  40 mg Oral q1800  . cilostazol  100 mg Oral BID  . insulin aspart  0-5 Units Subcutaneous QHS  . insulin aspart  0-9 Units Subcutaneous TID WC  . losartan  25 mg Oral Daily  . mirtazapine  15 mg Oral QHS  . nicotine  14 mg Transdermal Daily  . sertraline  100 mg Oral Daily  . sodium chloride flush  3 mL Intravenous Q12H   Continuous Infusions: . sodium chloride    . heparin 1,150 Units/hr (04/27/17 0210)  . nitroGLYCERIN 10 mcg/min (04/26/17 1642)   PRN Meds: sodium chloride, acetaminophen, ALPRAZolam, naLOXone (NARCAN)  injection, nitroGLYCERIN, ondansetron (ZOFRAN) IV, sodium chloride flush, zolpidem   Vital Signs    Vitals:   04/26/17 2022 04/27/17 0455 04/27/17 0500 04/27/17 0502  BP: 94/66  (!) 145/82   Pulse: (!) 106  79   Resp: 16  (!) 7   Temp: 98.1 F (36.7 C)   (!) 97.2 F (36.2 C)  TempSrc:    Axillary  SpO2: 98%  98%   Weight:  175 lb (79.4 kg)    Height:        Intake/Output Summary (Last 24 hours) at 04/27/17 1102 Last data filed at 04/26/17 2021  Gross per 24 hour  Intake           118.03 ml  Output              400 ml  Net          -281.97 ml   Filed Weights   04/25/17 1658 04/26/17 0300 04/27/17 0455  Weight: 171 lb 1.6 oz (77.6 kg) 170 lb 9.6 oz (77.4 kg) 175 lb (79.4 kg)   Telemetry    NSR - Personally  Reviewed  Physical Exam   GEN: No acute distress.  HEENT: Normocephalic, atraumatic, sclera non-icteric. Neck: No JVD or bruits. Cardiac: RRR no murmurs, rubs, or gallops.  Radials/DP/PT 1+ and equal bilaterally.  Respiratory: Clear to auscultation bilaterally. Breathing is unlabored. GI: Soft, nontender, non-distended, BS +x 4. MS: no deformity. Extremities: No clubbing or cyanosis. No edema. Distal pedal pulses are 2+ and equal bilaterally. Neuro:  AAOx3 but with quite a bit of prompting. Follows commands, but at times does not answer questions at all and has to be shaken  Psych: Does not freely interact unless prompted.  Labs    Chemistry Recent Labs Lab 04/25/17 0100 04/26/17 0009  NA 137 132*  K 4.0 3.9  CL 106 102  CO2 23 23  GLUCOSE 194* 160*  BUN 14 13  CREATININE 1.04 1.06  CALCIUM 8.7* 8.1*  GFRNONAA >60 >60  GFRAA >60 >60  ANIONGAP 8 7     Hematology Recent Labs Lab 04/25/17 0100 04/26/17 0009 04/27/17 0152  WBC 8.0 6.9 7.1  RBC 4.30 4.19* 3.97*  HGB 11.9* 11.4* 10.7*  HCT 36.0* 34.8* 33.4*  MCV 83.7 83.1 84.1  MCH 27.7 27.2 27.0  MCHC 33.1 32.8 32.0  RDW 14.0 13.7 13.6  PLT 140* 132* 147*    Cardiac Enzymes Recent Labs Lab 04/25/17 0433 04/25/17 1426 04/25/17 1812 04/26/17 0009  TROPONINI 0.29* 0.72* 0.63* 0.60*    Recent Labs Lab 04/25/17 0123 04/25/17 0419  TROPIPOC 0.00 0.19*     BNP Recent Labs Lab 04/25/17 0433  BNP 404.5*     DDimer  Recent Labs Lab 04/25/17 0433  DDIMER 1.05*     Radiology    Ct Head Wo Contrast  Result Date: 04/26/2017 CLINICAL DATA:  RIGHT facial droop. Last seen normal at 8 p.m. History of hypertension and diabetes. EXAM: CT HEAD WITHOUT CONTRAST TECHNIQUE: Contiguous axial images were obtained from the base of the skull through the vertex without intravenous contrast. COMPARISON:  CT HEAD Dec 23, 2016 FINDINGS: BRAIN: No intraparenchymal hemorrhage, mass effect nor midline shift. Old RIGHT  basal ganglia infarct with ex vacuo dilatation subjacent ventricle. Mild parenchymal brain volume loss, advanced for age. No hydrocephalus. Patchy supratentorial white matter hypodensities including LEFT frontal subcortical white matter. No abnormal extra-axial fluid collections. Basal cisterns are patent. VASCULAR: Moderate calcific atherosclerosis of the carotid siphons. SKULL: No skull fracture. No significant scalp soft tissue swelling. SINUSES/ORBITS: Moderate pan paranasal sinusitis. Mastoid air cells are well aerated.The included ocular globes and orbital contents are non-suspicious. OTHER: Poor dentition. ASPECTS [facility] Stroke Program Early CT Score) - Ganglionic level infarction (caudate, lentiform nuclei, internal capsule, insula, M1-M3 cortex): 7 - Supraganglionic infarction (M4-M6 cortex): 3 Total score (0-10 with 10 being normal): 10 IMPRESSION: 1. No acute intracranial process. 2. Mild to moderate chronic small vessel ischemic disease, worse within LEFT frontal lobe. 3. Old RIGHT basal ganglia infarct. 4. Moderate parenchymal brain volume loss for age. 5. ASPECTS is 10. 6. Critical Value/emergent results were called by telephone at the time of interpretation on 04/26/2017 at 12:54 am to Dr. Amada Jupiter, Neurology , who verbally acknowledged these results. Electronically Signed   By: Awilda Metro M.D.   On: 04/26/2017 00:55    Cardiac Studies   [facility]d [facility] has been [facility] per notes  Patient Profile     66 y.o. male with CAD, CABG 5 years ago at [facility], + tobacco 2ppd, DM-2, HLD, PAD, depression, debilitation (walks with walker), old stroke identified by CT this admission admitted with chest pain/SOB, found to have mildly elevated troponin, BNP, and d-dimer. Also reported to be lethargic intermittently this admission. Has been on heparin/NTG drip. On 04/26/17 at 1am had episode of unresponsiveness of unclear etiology. Narcan given; pt reported generalized pain and  also stated sometimes he is on a deep sleep state at baseline. VS were stable, CBG stable, CT head Mild to moderate chronic small vessel ischemic disease, worse within LEFT frontal lobe, old RIGHT basal ganglia infarct. EEG with nonspecific generalized cerebral dysfunction.  Assessment & Plan    1. CAD with possible NSTEMI - remains on heparin and NTG gtt along with aspirin. given mental status issues this admission, cardiac cath not yet pursued. I will review timing with Dr. Delton See (see below) - not really clear why patient is routinely lethargic.  2. Altered mental status - strange affect. Dr. Junius Argyle notes indicate he was lethargic during interaction in the ER as well as during rounding. Neurology on board for unresponsive event on 9/9, notes he was severely lethargic after this as well. MRI brain pending.  Na 132 yesterday but normal the day prior. Ammonia normal. ABG with low PO2. EEG nonspecific. Given medical issues including AMS, anemia and pressure ulcer, will ask for IM assistance - spoke with Dr. Konrad DoloresMerrell.  3. Pressure ulcer - see above, will ask for IM assistance. (Cardiology is currently primary.)  4. Hyperlipidemia - continue statin.  5. Diabetes mellitus - continue SSI.  6. Anemia - Hgb 13.4->11.9->10.7. No bleeding reported. Will request hemoccult of stools. Trend CBC. Appreciate IM assistance.  Signed, Laurann Montanaayna N Dunn, PA-C  04/27/2017, 11:02 AM    The patient was seen, examined and discussed with Ronie Spiesayna Dunn, PA-C and I agree with the above.   66 y.o. male with CAD[facility], + tobacco 2ppd, DM-2, HLD, PAD, depression, debilitation (walks with walker), old stroke identified by CT this admission admitted with chest pain/SOB, found to have mildly elevated troponin, BNP, and d-dimer. Also reported to be lethargic intermittently this admission.  MRI showed moderate chronic ischemic changes, no new stroke. Troponin is now downtrending, LVEF normal, we will hold  off on cath until her mental status improves, he appears better now than this morning, we will plan for a cath tomorrow.  EEG with nonspecific generalized cerebral dysfunction. Also mild anemia. Crea now back to normal.  Tobias AlexanderKatarina Delesia Martinek, MD 04/27/2017

## 2017-04-27 NOTE — Progress Notes (Signed)
  Echocardiogram 2D Echocardiogram has been performed.  Janalyn HarderWest, Tylah Mancillas R 04/27/2017, 10:46 AM

## 2017-04-27 NOTE — Progress Notes (Signed)
ANTICOAGULATION CONSULT NOTE - Follow-up Consult  Pharmacy Consult for heparin Indication: chest pain/ACS  No Known Allergies  Patient Measurements: Height: 6\' 1"  (185.4 cm) Weight: 175 lb (79.4 kg) (in bed) IBW/kg (Calculated) : 79.9  Vital Signs: Temp: 97.2 F (36.2 C) (09/10 0502) Temp Source: Axillary (09/10 0502) BP: 138/74 (09/10 0900) Pulse Rate: 63 (09/10 0900)  Labs:  Recent Labs  04/25/17 0100  04/25/17 1426 04/25/17 1812 04/26/17 0009 04/26/17 0724 04/26/17 1422 04/27/17 0152  HGB 11.9*  --   --   --  11.4*  --   --  10.7*  HCT 36.0*  --   --   --  34.8*  --   --  33.4*  PLT 140*  --   --   --  132*  --   --  147*  HEPARINUNFRC  --   < > 0.25*  --  0.22* 0.26* 0.54 0.49  CREATININE 1.04  --   --   --  1.06  --   --   --   TROPONINI <0.03  < > 0.72* 0.63* 0.60*  --   --   --   < > = values in this interval not displayed.  Estimated Creatinine Clearance: 78 mL/min (by C-G formula based on SCr of 1.06 mg/dL).   Medical History: Past Medical History:  Diagnosis Date  . Coronary artery disease   . Diabetes mellitus without complication (HCC)   . Hypertension     Assessment: 66 yo male c/o left CP, worse w/ movement/palpation, initial troponin negative but now elevated, continuing on heparin for ACS. Heparin level is therapeutic on 1150 units/hr.  No bleeding noted.  Goal of Therapy:  Heparin level 0.3-0.5 units/ml Monitor platelets by anticoagulation protocol: Yes   Plan:  Continue heparin at 1150 units/hr Daily heparin level and CBC Follow-up plans for cardiac cath vs stress test  [facility]iesKimberly Alton Tremblay, Pharm.D., BCPS Clinical Pharmacist Pager: [phone] Clinical phone for 04/27/2017 from 8:30-4:00 is [phone]. After 4pm, please call Main Rx [phone]) for assistance. 04/27/2017 11:35 AM

## 2017-04-28 ENCOUNTER — Inpatient Hospital Stay (HOSPITAL_COMMUNITY): Payer: Medicare HMO

## 2017-04-28 ENCOUNTER — Encounter (HOSPITAL_COMMUNITY)
Admission: EM | Disposition: A | Discharge status: Home / Self Care | Payer: Self-pay | Source: Home / Self Care | Attending: Cardiovascular Disease

## 2017-04-28 DIAGNOSIS — E440 Moderate protein-calorie malnutrition: Secondary | ICD-10-CM | POA: Insufficient documentation

## 2017-04-28 DIAGNOSIS — I739 Peripheral vascular disease, unspecified: Secondary | ICD-10-CM

## 2017-04-28 DIAGNOSIS — N179 Acute kidney failure, unspecified: Secondary | ICD-10-CM

## 2017-04-28 LAB — BASIC METABOLIC PANEL
ANION GAP: 5 (ref 5–15)
BUN: 26 mg/dL — ABNORMAL HIGH (ref 6–20)
CALCIUM: 8.2 mg/dL — AB (ref 8.9–10.3)
CHLORIDE: 106 mmol/L (ref 101–111)
CO2: 23 mmol/L (ref 22–32)
Creatinine, Ser: 1.5 mg/dL — ABNORMAL HIGH (ref 0.61–1.24)
GFR calc non Af Amer: 47 mL/min — ABNORMAL LOW (ref >60)
GFR, EST AFRICAN AMERICAN: 55 mL/min — AB (ref >60)
Glucose, Bld: 199 mg/dL — ABNORMAL HIGH (ref 65–99)
Potassium: 4.8 mmol/L (ref 3.5–5.1)
SODIUM: 134 mmol/L — AB (ref 135–145)

## 2017-04-28 LAB — PROTIME-INR
INR: 1.11
PROTHROMBIN TIME: 14.2 s (ref 11.4–15.2)

## 2017-04-28 LAB — GLUCOSE, CAPILLARY
GLUCOSE-CAPILLARY: 167 mg/dL — AB (ref 65–99)
Glucose-Capillary: 242 mg/dL — ABNORMAL HIGH (ref 65–99)
Glucose-Capillary: 206 mg/dL — ABNORMAL HIGH (ref 65–99)
Glucose-Capillary: 265 mg/dL — ABNORMAL HIGH (ref 65–99)

## 2017-04-28 LAB — CBC
HCT: 33 % — ABNORMAL LOW (ref 39.0–52.0)
Hemoglobin: 10.5 g/dL — ABNORMAL LOW (ref 13.0–17.0)
MCH: 26.9 pg (ref 26.0–34.0)
MCHC: 31.8 g/dL (ref 30.0–36.0)
MCV: 84.6 fL (ref 78.0–100.0)
Platelets: 146 10*3/uL — ABNORMAL LOW (ref 150–400)
RBC: 3.9 MIL/uL — ABNORMAL LOW (ref 4.22–5.81)
RDW: 13.8 % (ref 11.5–15.5)
WBC: 6.2 10*3/uL (ref 4.0–10.5)

## 2017-04-28 LAB — HEPARIN LEVEL (UNFRACTIONATED): Heparin Unfractionated: 0.61 IU/mL (ref 0.30–0.70)

## 2017-04-28 SURGERY — LEFT HEART CATH AND CORS/GRAFTS ANGIOGRAPHY
Anesthesia: LOCAL

## 2017-04-28 MED ORDER — ATORVASTATIN CALCIUM 20 MG PO TABS
20.0000 mg | ORAL_TABLET | Freq: Every day | ORAL | Status: DC
  Administered 2017-04-28: 20 mg via ORAL
  Filled 2017-04-28: qty 1

## 2017-04-28 MED ORDER — GLUCERNA SHAKE PO LIQD: 237.0000 mL | Freq: Two times a day (BID) | ORAL | Status: DC

## 2017-04-28 MED ORDER — ISOSORBIDE MONONITRATE ER 30 MG PO TB24
30.0000 mg | ORAL_TABLET | Freq: Every day | ORAL | Status: DC
  Administered 2017-04-28 – 2017-04-29 (×2): 30 mg via ORAL
  Filled 2017-04-28 (×2): qty 1

## 2017-04-28 MED ORDER — METOPROLOL TARTRATE 12.5 MG HALF TABLET
12.5000 mg | ORAL_TABLET | Freq: Two times a day (BID) | ORAL | Status: DC
  Administered 2017-04-28 – 2017-04-29 (×3): 12.5 mg via ORAL
  Filled 2017-04-28 (×3): qty 1

## 2017-04-28 MED ORDER — ADULT MULTIVITAMIN W/MINERALS CH
1.0000 | ORAL_TABLET | Freq: Every day | ORAL | Status: DC
  Administered 2017-04-28 – 2017-04-29 (×2): 1 via ORAL
  Filled 2017-04-28 (×2): qty 1

## 2017-04-28 NOTE — Care Management Note (Signed)
Case Management Note  Patient Details  Name: Jordan Gardner MRN: [id number] Date of Birth: 03/14/1951  Subjective/Objective: Pt presented for Nstemi- Initiated on IV Heparin gtt. 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 Mild-to-moderate chronic small vessel ischemic disease. PT Recommendations for SNF- CSW is following for assistance with disposition needs.                   Action/Plan: CM will continue to monitor.   Expected Discharge Date:                  Expected Discharge Plan:  Skilled Nursing Facility  In-House Referral:  Clinical Social Work  Discharge planning Services  CM Consult  Post Acute Care Choice:  NA Choice offered to:  NA  DME Arranged:  N/A DME Agency:  NA  HH Arranged:  NA HH Agency:  NA  Status of Service:  Completed, signed off  If discussed at Long Length of Stay Meetings, dates discussed:    Additional Comments:  Gala LewandowskyGraves-Bigelow, Glenisha Gundry Kaye, RN 04/28/2017, 3:30 PM

## 2017-04-28 NOTE — Progress Notes (Addendum)
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  GOAL:   Patient will meet greater than or equal to 90% of their needs  MONITOR:   PO intake, Supplement acceptance, Labs, Weight trends, Skin, I & O's  REASON FOR ASSESSMENT:   Consult Assessment of nutrition requirement/status  ASSESSMENT:   Jordan Gardner is a 66 y.o. male with a history of CAD, [facility], + tobacco 2ppd, DM-2, HLD and disability walks with walker.    Pt admitted with chest pain.   Hx obtained from pt sister at bedside. Pt lethargic and did not answer RD questions, however, spoke with pt sister regarding meal order at conclusion of interview. She reports pt has experienced a general decline in health over the past 6 months, in regards to nutrition and ambulation. She shares that pt has had a variable appetite and often craves foods such as sweets; pt is also a "finicky eater". Pt's intake diminished to bites and sips for the past few days PTA and sister reports pt has had improved intake during hospitalization (noted 100% meal completion per doc flowsheets). Pt selects softer textured foods, due to extracted molars. She also reports pt complains of clothes fitting looser.  Per pt sister, pt has lost wt over the past 6 months, but unsure of how much. She reveals UBW is around 205# and estimates he has experienced a 30# (14.6%) wt loss over this time period, however, no wt hx available to verify this statement.   Nutrition-Focused physical exam  completed. Findings are mild to moderate fat depletion, mild to moderate muscle depletion, and no edema.   Pt sister confirms presence of rt toe DM ulcers and stage II pressure injury on sacrum; was receiving home health services for wound care.   Discussed importance of good meal and supplement intake to promote healing. Pt sister believes pt would take supplements if offered to him.   Labs reviewed: Na: 134, CBGS: 167-242. Last Hgb A1c: 7.4 (04/25/17); current orders for glycemic control are 0-5 units of insulin aspart q HS and 0-9 units insulin aspart TID with meals. Outpatient DM medication is 1000 mg Metofrmin BID.  Diet Order:  Diet heart healthy/carb modified Room service appropriate? Yes; Fluid consistency: Thin  Skin:   (DM ulcer rt distal toe, st II sacrum)  Last BM:  04/26/17  Height:   Ht Readings from Last 1 Encounters:  04/25/17 6\' 1"  (1.854 m)    Weight:   Wt Readings from Last 1 Encounters:  04/27/17 175 lb 3.2 oz (79.5 kg)    Ideal Body Weight:  83.6 kg  BMI:  Body mass index is 23.11 kg/m.  Estimated Nutritional Needs:   Kcal:  2300-2500  Protein:  115-130 grams  Fluid:  >2.3 L  EDUCATION NEEDS:   Education needs addressed  Avangeline Stockburger A. Mayford KnifeWilliams, RD, LDN, CDE Pager: [phone] After hours Pager: [phone]

## 2017-04-28 NOTE — Progress Notes (Signed)
VASCULAR LAB PRELIMINARY  ARTERIAL  ABI completed:    RIGHT    LEFT    PRESSURE WAVEFORM  PRESSURE WAVEFORM  BRACHIAL 116 Tri BRACHIAL 132 Tri  DP 69 Dampened mono DP 62 mono  PT 70 mono PT 58 mono  GREAT TOE  NA GREAT TOE  NA    RIGHT LEFT  ABI 0.53 0.47   Right ABI indicates moderate peripheral arterial disease. Left ABI indicates severe peripheral arterial disease.  Unable to complete toe PPG due to great toe ulcer.  Chauncey FischerCharlotte C Trumaine Wimer, RVT 04/28/2017, 2:16 PM

## 2017-04-28 NOTE — Progress Notes (Addendum)
Progress Note  Patient Name: Jordan Gardner Date of Encounter: 04/28/2017  Primary Cardiologist[facility]  Subjective   As before, patient selectively answers some questions and remains completely silent for others. However, when I broached the issue of proceeding with cath, he was quite clear in that he wishes to avoid this procedure unless absolutely necessary. We discussed option of medical therapy and he requests to try this first, and consider cath for refractory symptoms.  Inpatient Medications    Scheduled Meds: . [START ON 04/29/2017] aspirin EC  81 mg Oral Daily  . atorvastatin  40 mg Oral q1800  . cilostazol  100 mg Oral BID  . insulin aspart  0-5 Units Subcutaneous QHS  . insulin aspart  0-9 Units Subcutaneous TID WC  . mirtazapine  15 mg Oral QHS  . nicotine  14 mg Transdermal Daily  . sertraline  100 mg Oral Daily  . sodium chloride flush  3 mL Intravenous Q12H  . sodium chloride flush  3 mL Intravenous Q12H   Continuous Infusions: . sodium chloride    . sodium chloride    . sodium chloride 1 mL/kg/hr (04/28/17 [id number])  . heparin 1,150 Units/hr (04/28/17 0050)  . nitroGLYCERIN 10 mcg/min (04/28/17 0543)   PRN Meds: sodium chloride, sodium chloride, acetaminophen, ALPRAZolam, naLOXone (NARCAN)  injection, nitroGLYCERIN, ondansetron (ZOFRAN) IV, sodium chloride flush, sodium chloride flush, zolpidem   Vital Signs    Vitals:   04/27/17 1338 04/27/17 2001 04/27/17 2100 04/28/17 0502  BP: 90/62  110/69 138/87  Pulse: 89  99 85  Resp: 18  17 17   Temp: 98 F (36.7 C)  98.1 F (36.7 C) 98.2 F (36.8 C)  TempSrc: Oral     SpO2: 96%  92% 93%  Weight:  175 lb 3.2 oz (79.5 kg)    Height:        Intake/Output Summary (Last 24 hours) at 04/28/17 0756 Last data filed at 04/28/17 0507  Gross per 24 hour  Intake           733.27 ml  Output             1225 ml  Net          -491.73 ml   Filed Weights   04/26/17 0300 04/27/17 0455 04/27/17 2001  Weight: 170  lb 9.6 oz (77.4 kg) 175 lb (79.4 kg) 175 lb 3.2 oz (79.5 kg)    Telemetry    NSR - Personally Reviewed  Physical Exam   GEN: No acute distress.  HEENT: Normocephalic, atraumatic, sclera non-icteric. Neck: No JVD or bruits. Cardiac: RRR no murmurs, rubs, or gallops.  Radials/DP/PT 1+ and equal bilaterally.  Respiratory: Clear to auscultation bilaterally. Breathing is unlabored. GI: Soft, nontender, non-distended, BS +x 4. MS: no deformity. Extremities: No clubbing or cyanosis. No edema. Distal pedal pulses are 2+ and equal bilaterally. Toe ulcer r great toe Neuro:  AAOx3 but again with quite a bit of prompting. Follows commands some of the time, remains still and quiet at other times. Psych: Does not freely interact unless prompted. Selectively answers some questions and remains silent for others  Labs    Chemistry Recent Labs Lab 04/25/17 0100 04/26/17 0009 04/28/17 0441  NA 137 132* 134*  K 4.0 3.9 4.8  CL 106 102 106  CO2 23 23 23   GLUCOSE 194* 160* 199*  BUN 14 13 26*  CREATININE 1.04 1.06 1.50*  CALCIUM 8.7* 8.1* 8.2*  GFRNONAA >60 >60 47*  GFRAA >60 >  60 55*  ANIONGAP Hematology Recent Labs Lab 04/26/17 0009 04/27/17 0152 04/28/17 0441  WBC 6.9 7.1 6.2  RBC 4.19* 3.97* 3.90*  HGB 11.4* 10.7* 10.5*  HCT 34.8* 33.4* 33.0*  MCV 83.1 84.1 84.6  MCH 27.2 27.0 26.9  MCHC 32.8 32.0 31.8  RDW 13.7 13.6 13.8  PLT 132* 147* 146*   Cardiac Enzymes Recent Labs Lab 04/25/17 0433 04/25/17 1426 04/25/17 1812 04/26/17 0009  TROPONINI 0.29* 0.72* 0.63* 0.60*    Recent Labs Lab 04/25/17 0123 04/25/17 0419  TROPIPOC 0.00 0.19*    BNP Recent Labs Lab 04/25/17 0433  BNP 404.5*    DDimer  Recent Labs Lab 04/25/17 0433  DDIMER 1.05*    Radiology    Mr Brain Wo Contrast  Result Date: 04/28/2017 CLINICAL DATA:  Altered level of consciousness, somnolent. Admitted for suspected myocardial infarction and hypertensive urgency. EXAM: MRI HEAD  WITHOUT CONTRAST TECHNIQUE: Multiplanar, multiecho pulse sequences of the brain and surrounding structures were obtained without intravenous contrast. COMPARISON:  CT HEAD April 26, 2017 FINDINGS: BRAIN: Faint LEFT frontal subcortical reduced diffusion without ADC abnormality. No susceptibility artifact to suggest hemorrhage. Old RIGHT basal ganglia infarct with mineralization. Ex vacuo dilatation RIGHT frontal horn of lateral ventricle. Mild general parenchymal brain volume loss for age. Patchy supratentorial white matter FLAIR T2 hyperintensities exclusive of the aforementioned abnormality. No midline shift, mass effect or masses. VASCULAR: Normal major intracranial vascular flow voids present at skull base. SKULL AND UPPER CERVICAL SPINE: No abnormal sellar expansion. No suspicious calvarial bone marrow signal. Craniocervical junction maintained. SINUSES/ORBITS: Moderate paranasal sinus mucosal thickening. Mastoid air cells are well aerated. Included ocular globes and orbital contents are non-suspicious. OTHER: None. IMPRESSION: 1. 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 Mild-to-moderate chronic small vessel ischemic disease. 2. Old RIGHT basal ganglia infarct. Mild parenchymal brain volume loss for age. Electronically Signed   By: Awilda Metro M.D.   On: 04/28/2017 00:35   Cardiac Studies   2D echo 04/27/17 Study Conclusions - Left ventricle: The cavity size was normal. Wall thickness was   increased in a pattern of mild LVH. Systolic function was normal.   The estimated ejection fraction was in the range of 60% to 65%.   Doppler parameters are consistent with abnormal left ventricular   relaxation (grade 1 diastolic dysfunction). - Aortic valve: There was trivial regurgitation.    Patient Profile     66 y.o. male with CAD, CABG 5 years ago at [facility], + tobacco 2ppd, DM-2, HLD, PAD, depression, debilitation (walks with walker), old stroke identified by  CT this admission admitted with chest pain/SOB, found to have mildly elevated troponin, BNP, and d-dimer. Also reported to be lethargic intermittently this admission. Has been on heparin/NTG drip. On 04/26/17 at 1am had episode of unresponsiveness of unclear etiology. Narcan given; pt reported generalized pain and also stated sometimes he is on a deep sleep state at baseline. VS were stable, CBG stable, CT head Mild to moderate chronic small vessel ischemic disease, worse within LEFT frontal lobe, old RIGHT basal ganglia infarct. EEG with nonspecific generalized cerebral dysfunction.  Assessment & Plan    1. CAD with possible NSTEMI - mildly elevated troponin. Was initially admitted for heparin/NTG with plan for cath. This has been complicated by his strange mental status. Discussed this in depth with the patient today who actually expresses that he does not wish to undergo any invasive procedures. This is not  unreasonable as undiagnosed mental disorder, chronic debility/lethargy, progressive anemia, poor wound healing and new AKI make him a less ideal candidate for cath. LVEF wnl. Will stop heparin. Rx Imdur  daily and stop NTG gtt 1 hour later. PT eval pending. If no further angina, would consider d/c in AM. Based on #2 I would not be surprised if SNF is actually recommended.  2. Altered mental status - strange affect. I reviewed prior notes in our system which also relay a strange, minimally communicative affect. Dr. Junius Argyle notes indicate patient was lethargic during interaction in the ER as well as during rounding. Neurology on board for unresponsive event on 9/9, notes he was severely lethargic after this as well. MRI brain shows faint subacute to chronic left frontal white matter infarct and old right basal ganglia infarct. Ammonia normal. ABG with low PO2. EEG nonspecific. IM spoke with patient's sister with whom he lives with who reports patient has some sort of undiagnosed mental disorder and  that this is his baseline. Appreciate IM assistance.  3. Pressure ulcer - appreciate IM assistance.  4. PAD - ulceration on R great toe with diminished sensation. On pletal at home. ABIs pending.  5. Hyperlipidemia - continue statin.  6. Diabetes mellitus - continue SSI.  7. Anemia - Hgb 13.4->11.9->10.7->10.5. No bleeding reported. Hemoccult of stools pending. Appreciate IM assistance.  8. 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 He was 1.06 on 9/9. Will hold losartan for now and resume diet. Encourage oral intake.  Signed, Laurann Montana, PA-C  04/28/2017, 7:56 AM    The patient was seen, examined and discussed with Ronie Spies, PA-C and I agree with the above.   66 y.o. male with CAD, CABG 5 years ago at [facility], + tobacco 2ppd, DM-2, HLD, PAD, depression, debilitation (walks with walker), old stroke identified by CT this admission admitted with chest pain/SOB, found to have mildly elevated troponin, BNP, and d-dimer. Also reported to be lethargic intermittently this admission. More oriented today. MRI showed moderate chronic ischemic changes, no new stroke. EEG with nonspecific generalized cerebral dysfunction. Also mild anemia. Crea up today 1->1.5. He appears euvolemic, I would hold Lasix.   Troponin is now downtrending, LVEF normal, the patient doesn't wish to have a cardiac acth unless absolutely necessary. I agree that with lack of symptoms and preserved LVEF a medical therapy is reasonable.  We will continue atorvastatin, decrease the dose in the settings of acute mental status changes, continue aspirin, add low dose metoprolol 12.5 mg po BID.  Primary team plans to call psych today.  Tobias Alexander, MD 04/28/2017

## 2017-04-28 NOTE — Evaluation (Signed)
Physical Therapy Evaluation Patient Details Name: Jordan Gardner Bevan MRN: [id number] DOB: 06/12/1951 Today's Date: 04/28/2017   History of Present Illness  Pt adm with chest pain and found to have NSTEMI. PMH - CAD, CABG, DM, HTN, per sister undiagnosed mental disorder  Clinical Impression  Pt admitted with above diagnosis and presents to PT with functional limitations due to deficits listed below (See PT problem list). Pt needs skilled PT to maximize independence and safety to allow discharge to ST-SNF. Believe pt will make improvements at SNF allowing him to be more independent at home.     Follow Up Recommendations SNF    Equipment Recommendations  None recommended by PT    Recommendations for Other Services       Precautions / Restrictions Precautions Precautions: Fall Restrictions Weight Bearing Restrictions: No      Mobility  Bed Mobility Overal bed mobility: Needs Assistance Bed Mobility: Supine to Sit     Supine to sit: Min assist;HOB elevated     General bed mobility comments: Min A to pull hips towards EOB and elevate trunk  Transfers Overall transfer level: Needs assistance Equipment used: Rolling walker (2 wheeled) Transfers: Sit to/from Stand Sit to Stand: Min assist;From elevated surface;+2 safety/equipment         General transfer comment: Min A to power up into standing  Ambulation/Gait Ambulation/Gait assistance: Min assist Ambulation Distance (Feet): 20 Feet Assistive device: Rolling walker (2 wheeled) Gait Pattern/deviations: Step-through pattern;Decreased step length - right;Decreased step length - left;Shuffle Gait velocity: decr Gait velocity interpretation: Below normal speed for age/gender General Gait Details: Assist for balance and support  Stairs            Wheelchair Mobility    Modified Rankin (Stroke Patients Only)       Balance Overall balance assessment: Needs assistance Sitting-balance support: No upper extremity  supported;Feet supported Sitting balance-Leahy Scale: Fair     Standing balance support: No upper extremity supported;During functional activity Standing balance-Leahy Scale: Fair                               Pertinent Vitals/Pain Pain Assessment: Faces Faces Pain Scale: No hurt    Home Living Family/patient expects to be discharged to:: Private residence Living Arrangements: Other relatives (sister) Available Help at Discharge: Family;Available PRN/intermittently Type of Home: House Home Access: Stairs to enter Entrance Stairs-Rails: Right;Left     Home Equipment: Environmental consultantWalker - 4 wheels;Shower seat;Grab bars - tub/shower Additional Comments: [facility] comes to pick him up and goes to wellness center    Prior Function Level of Independence: Needs assistance   Gait / Transfers Assistance Needed: Amb modified independent with walker short distances. Pt spends the majority of his time in bed at home per his preference  ADL's / Homemaking Assistance Needed: Pt reports that he receives assistance for dressing/bathing when he goes to the wellness center.  Comments: Pt goes to wellness center 3 times a week     Hand Dominance   Dominant Hand: Right    Extremity/Trunk Assessment   Upper Extremity Assessment Upper Extremity Assessment: Defer to OT evaluation    Lower Extremity Assessment Lower Extremity Assessment: Generalized weakness    Cervical / Trunk Assessment Cervical / Trunk Assessment: Other exceptions Cervical / Trunk Exceptions: forward flexion. Benefits from tactile and verbal cues to maintain upright posture  Communication   Communication: No difficulties  Cognition Arousal/Alertness: Awake/alert Behavior During Therapy: WFL for tasks assessed/performed;Flat  affect Overall Cognitive Status: No family/caregiver present to determine baseline cognitive functioning                                 General Comments: Pt with inconsistancy in  reporting PLOF and home set up. Pt requiring VCs during ADLs and functional mobility for safety      General Comments      Exercises     Assessment/Plan    PT Assessment Patient needs continued PT services  PT Problem List Decreased strength;Decreased activity tolerance;Decreased balance;Decreased mobility;Decreased knowledge of use of DME       PT Treatment Interventions DME instruction;Gait training;Functional mobility training;Therapeutic activities;Therapeutic exercise;Balance training;Patient/family education    PT Goals (Current goals can be found in the Care Plan section)  Acute Rehab PT Goals Patient Stated Goal: "Scratch my back" PT Goal Formulation: With patient Time For Goal Achievement: 05/05/17 Potential to Achieve Goals: Good    Frequency Min 3X/week   Barriers to discharge Decreased caregiver support;Inaccessible home environment Stairs to enter home and home alone at times    Co-evaluation PT/OT/SLP Co-Evaluation/Treatment: Yes Reason for Co-Treatment: Other (comment) PT goals addressed during session: Mobility/safety with mobility         AM-PAC PT "6 Clicks" Daily Activity  Outcome Measure Difficulty turning over in bed (including adjusting bedclothes, sheets and blankets)?: Unable Difficulty moving from lying on back to sitting on the side of the bed? : Unable Difficulty sitting down on and standing up from a chair with arms (e.g., wheelchair, bedside commode, etc,.)?: Unable Help needed moving to and from a bed to chair (including a wheelchair)?: A Little Help needed walking in hospital room?: A Little Help needed climbing 3-5 steps with a railing? : A Lot 6 Click Score: 11    End of Session Equipment Utilized During Treatment: Gait belt Activity Tolerance: Patient limited by fatigue Patient left: in chair;with call bell/phone within reach;with chair alarm set Nurse Communication: Mobility status PT Visit Diagnosis: Unsteadiness on feet  (R26.81);Other abnormalities of gait and mobility (R26.89);Muscle weakness (generalized) (M62.81)    Time: 4403-4742 PT Time Calculation (min) (ACUTE ONLY): 25 min   Charges:   PT Evaluation $PT Eval Moderate Complexity: 1 Mod     PT G CodesMarland Kitchen        [facility] PT [phone]   Angelina Ok [facility] 04/28/2017, 2:51 PM

## 2017-04-28 NOTE — Evaluation (Signed)
Occupational Therapy Evaluation Patient Details Name: Jordan Gardner MRN: [id number] DOB: [birth year] Today's Date: 04/28/2017    History of Present Illness Pt adm with chest pain and found to have NSTEMI. PMH - CAD, CABG, DM, HTN, per sister undiagnosed mental disorder   Clinical Impression   PTA, pt living with his sister and was receiving assistance for ADLs. Pt currently requiring Min A for LB ADLs and functional mobility with RW. Pt presenting with decreased activity tolerance and endurance during ADLs. Pt would benefit from acute OT to facilitate safe dc. Recommend dc to SNF for further OT to increase safety and independence with ADLs and functional mobility.     Follow Up Recommendations  SNF;Supervision/Assistance - 24 hour    Equipment Recommendations  Other (comment) (Defer to next venue)    Recommendations for Other Services PT consult     Precautions / Restrictions Precautions Precautions: Fall Restrictions Weight Bearing Restrictions: No      Mobility Bed Mobility Overal bed mobility: Needs Assistance Bed Mobility: Supine to Sit     Supine to sit: Min assist;HOB elevated     General bed mobility comments: Min A to pull hips towards EOB and elevate trunk  Transfers Overall transfer level: Needs assistance Equipment used: Rolling walker (2 wheeled) Transfers: Sit to/from Stand Sit to Stand: Min assist;From elevated surface;+2 safety/equipment         General transfer comment: Min A to power up into standing    Balance Overall balance assessment: Needs assistance Sitting-balance support: No upper extremity supported;Feet supported Sitting balance-Leahy Scale: Fair     Standing balance support: No upper extremity supported;During functional activity Standing balance-Leahy Scale: Fair                             ADL either performed or assessed with clinical judgement   ADL Overall ADL's : Needs assistance/impaired Eating/Feeding:  Set up;Sitting   Grooming: Min guard;Standing;Oral care   Upper Body Bathing: Min guard;Sitting   Lower Body Bathing: Minimal assistance;Sit to/from stand   Upper Body Dressing : Min guard;Sitting   Lower Body Dressing: Minimal assistance;Sit to/from stand Lower Body Dressing Details (indicate cue type and reason): Pt able to adjust socks. Pt requiring Min A to standing balacne during dynamic tasks Toilet Transfer: Minimal assistance;RW;Cueing for safety;Cueing for sequencing;Ambulation (Simulated to recliner)           Functional mobility during ADLs: Minimal assistance;Rolling walker General ADL Comments: Pt presenting with decreased functional performance and activity tolerance. Pt requiring Min A for LB ADLs and functional mobility.     Vision Baseline Vision/History: Wears glasses Wears Glasses: At all times Patient Visual Report: Other (comment) (Pt does not have glasses with him)       Perception     Praxis      Pertinent Vitals/Pain Pain Assessment: Faces     Hand Dominance Right   Extremity/Trunk Assessment Upper Extremity Assessment Upper Extremity Assessment: Generalized weakness   Lower Extremity Assessment Lower Extremity Assessment: Generalized weakness   Cervical / Trunk Assessment Cervical / Trunk Assessment: Other exceptions Cervical / Trunk Exceptions: forward flexion. Benefits from tactile and verbal cues to maintain upright posture   Communication Communication Communication: No difficulties   Cognition Arousal/Alertness: Awake/alert Behavior During Therapy: WFL for tasks assessed/performed;Flat affect Overall Cognitive Status: No family/caregiver present to determine baseline cognitive functioning  General Comments: Pt with inconsistancy in reporting PLOF and home set up. Pt requiring VCs during ADLs and functional mobility for safety   General Comments       Exercises     Shoulder  Instructions      Home Living Family/patient expects to be discharged to:: Private residence Living Arrangements: Other relatives (sister) Available Help at Discharge: Family;Available PRN/intermittently Type of Home: House Home Access: Stairs to enter   Entrance Stairs-Rails: Right;Left       Bathroom Shower/Tub: Chief Strategy OfficerTub/shower unit   Bathroom Toilet: Standard     Home Equipment: Environmental consultantWalker - 4 wheels;Shower seat;Grab bars - tub/shower   Additional Comments: [facility] comes to pick him up and goes to wellness center      Prior Functioning/Environment Level of Independence: Needs assistance  Gait / Transfers Assistance Needed: Amb modified independent with walker short distances. Pt spends the majority of his time in bed at home per his preference ADL's / Homemaking Assistance Needed: Pt reports that he receives assistance for dressing/bathing when he goes to the wellness center.   Comments: Pt goes to wellness center 3 times a week        OT Problem List: Decreased strength;Decreased range of motion;Decreased activity tolerance;Impaired balance (sitting and/or standing);Decreased cognition;Decreased safety awareness;Decreased knowledge of use of DME or AE;Decreased knowledge of precautions;Pain      OT Treatment/Interventions: Self-care/ADL training;Therapeutic exercise;Energy conservation;DME and/or AE instruction;Therapeutic activities;Patient/family education    OT Goals(Current goals can be found in the care plan section) Acute Rehab OT Goals Patient Stated Goal: "Scratch my back" OT Goal Formulation: With patient Time For Goal Achievement: 05/12/17 Potential to Achieve Goals: Good ADL Goals Pt Will Perform Grooming: with modified independence;standing Pt Will Perform Upper Body Dressing: with modified independence;sitting Pt Will Perform Lower Body Dressing: with modified independence;sit to/from stand Pt Will Transfer to Toilet: with modified independence;ambulating;regular  height toilet Pt Will Perform Toileting - Clothing Manipulation and hygiene: with modified independence;sit to/from stand  OT Frequency: Min 2X/week   Barriers to D/C:            Co-evaluation              AM-PAC PT "6 Clicks" Daily Activity     Outcome Measure Help from another person eating meals?: None Help from another person taking care of personal grooming?: A Little Help from another person toileting, which includes using toliet, bedpan, or urinal?: A Little Help from another person bathing (including washing, rinsing, drying)?: A Little Help from another person to put on and taking off regular upper body clothing?: A Little Help from another person to put on and taking off regular lower body clothing?: A Little 6 Click Score: 19   End of Session Equipment Utilized During Treatment: Gait belt;Rolling walker Nurse Communication: Mobility status;Precautions  Activity Tolerance: Patient tolerated treatment well;Patient limited by fatigue Patient left: in chair;with call bell/phone within reach;with chair alarm set  OT Visit Diagnosis: Unsteadiness on feet (R26.81);Other abnormalities of gait and mobility (R26.89);Muscle weakness (generalized) (M62.81);Pain Pain - Right/Left:  (Generalized) Pain - part of body:  (Generalized)                Time: [id number] OT Time Calculation (min): 25 min Charges:  OT General Charges $OT Visit: 1 Visit OT Evaluation $OT Eval Moderate Complexity: 1 Mod G-Codes:     Breonna Gafford MSOT, OTR/L Acute Rehab Pager: [phone] Office: [phone]  Theodoro GristCharis M Dealie Koelzer 04/28/2017, 1:12 PM

## 2017-04-28 NOTE — Progress Notes (Signed)
[facility] PROGRESS NOTE  Jacey Bless ZOX:[id number] DOB: 1951/02/09 DOA: 04/25/2017 PCP: Patient, No Pcp Per  Interim summary and HPI  66 y.o. male CAD, diabetes, hypertension. Patient was admitted on 04/25/2017 due to concerns for CAD/NSTEMI with a presenting complaint of chest pain and hypertensive urgency. 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 Patient smokes 1-2 packs of cigarettes per day. On subsequent days patient has continued to be somnolent and this engaged in conversation. He has been continued on IV nitroglycerin, the olmesartan, IV heparin, by mouth ASA and Lipitor. Patient currently is chest pain-free. Patient's only complaint is right great toe pain which has been persistent for several months. Additionally patient states that when he ambulates his legs ache. Patient really denies any palpitations, shortness breath, nausea, vomiting, dumping, dysuria, fevers, neck stiffness, focal neurological deficit.  Assessment/Plan: 1-CAD with presumed NSTEMI: -initially admitted for heparin drip and NTG with plans for cath by primary service -patient refusing cath -no CP, no SOB and preserved EF; I think is reasonable to pursuit medical management   2-AMS/flat affect/depression -normal ammonia, TSH and no acute findings on MRI or EEG -will benefit of antidepressant drugs that will not increase somnolence -psych has been consulted  3-physical deconditioning, protein calorie malnutrition (moderate) and stage pressure injury -per PT SNF recommended -dietitian on board, will follow rec's for nutritional supplement  -continue overlay mattress and preventive measures  4-PAD: with right great toe ulceration -no signs of superimposed infection  -continue pletal  5-HLD -continue statins   6-type 2 diabetes with nephropathy  -continue SSI  7-renal failure: base on GFR 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 No other acute abnormalities seen to explained affect and odd behavior. Remote stroke.  Procedures:  See below for x-ray reports    2-D echo  - Left ventricle: The cavity size was normal. Wall thickness was   increased in a pattern of mild LVH. Systolic function was normal.   The estimated ejection fraction was in the range of 60% to 65%.   Doppler parameters are consistent with abnormal left ventricular   relaxation (grade 1 diastolic dysfunction). - Aortic valve: There was trivial regurgitation.   EEG: The EEG is abnormal and findings are suggestive of Nonspecific generalized cerebral dysfunction. Epileptiform activity was not seen during this recording.  Antibiotics:  None   HPI/Subjective: Afebrile, no CP, no SOB, oriented X3; flat affect and poor interaction.  Objective: Vitals:   04/28/17 1140 04/28/17 1316  BP: 125/69   Pulse: 85 100  Resp: 17   Temp: (!) 97.4 F (36.3 C)   SpO2: 94%     Intake/Output Summary (Last 24 hours) at 04/28/17 1628 Last data filed at 04/28/17 0507  Gross per 24 hour  Intake           373.27 ml  Output             1225 ml  Net          -851.73 ml   Filed Weights   04/26/17 0300 04/27/17 0455 04/27/17 2001  Weight: 77.4 kg (170 lb 9.6 oz) 79.4 kg (175 lb) 79.5 kg (175 lb 3.2 oz)    Exam:   General:  Afebrile, flat affect; patient  with poor interaction, but answering questions appropriately. He was oriented X3. Denies SI and hallucinations.  Cardiovascular: S1 and S2, no rubs, no gallops  Respiratory: good air movement, no wheezing, no crackles  Abdomen: soft, no tenderness, no distended   Musculoskeletal: no edema, no cyanosis   Skin:right great toe with 1cm tip ulceration, no redness,  no discharge. Also with stage 1 decubitus ulcer.  Data Reviewed: Basic Metabolic Panel:  Recent Labs Lab 04/25/17 0100 04/25/17 1426 04/26/17 0009 04/28/17 0441  NA 137  --  132* 134*  K 4.0  --  3.9 4.8  CL 106  --  102 106  CO2 23  --  23 23  GLUCOSE 194*  --  160* 199*  BUN 14  --  13 26*  CREATININE 1.04  --  1.06 1.50*  CALCIUM 8.7*  --  8.1* 8.2*  MG  --  1.4* 1.9  --     Recent Labs Lab 04/26/17 0234  AMMONIA 25   CBC:  Recent Labs Lab 04/25/17 0100 04/26/17 0009 04/27/17 0152 04/28/17 0441  WBC 8.0 6.9 7.1 6.2  HGB 11.9* 11.4* 10.7* 10.5*  HCT 36.0* 34.8* 33.4* 33.0*  MCV 83.7 83.1 84.1 84.6  PLT 140* 132* 147* 146*   Cardiac Enzymes:  Recent Labs Lab 04/25/17 0100 04/25/17 0433 04/25/17 1426 04/25/17 1812 04/26/17 0009  TROPONINI <0.03 0.29* 0.72* 0.63* 0.60*   BNP (last 3 results)  Recent Labs  04/25/17 0433  BNP 404.5*   CBG:  Recent Labs Lab 04/27/17 1121 04/27/17 1626 04/27/17 2115 04/28/17 0724 04/28/17 1140  GLUCAP 118* 226* 264* 167* 242*    Recent Results (from the past 240 hour(s))  MRSA PCR Screening     Status: None   Collection Time: 04/25/17  5:05 PM  Result Value Ref Range Status   MRSA by PCR NEGATIVE NEGATIVE Final    Comment:        The GeneXpert MRSA Assay (FDA approved for NASAL specimens only), is one component of a comprehensive MRSA colonization surveillance program. It is not intended to diagnose MRSA infection nor to guide or monitor treatment for MRSA infections.      Studies: Mr Brain Wo Contrast  Result Date: 04/28/2017 CLINICAL DATA:  Altered level of consciousness, somnolent. Admitted for suspected myocardial infarction and hypertensive urgency. EXAM: MRI HEAD WITHOUT CONTRAST TECHNIQUE: Multiplanar, multiecho pulse sequences of the brain and surrounding structures were obtained without intravenous contrast. COMPARISON:  CT HEAD April 26, 2017 FINDINGS: BRAIN: Faint LEFT frontal  subcortical reduced diffusion without ADC abnormality. No susceptibility artifact to suggest hemorrhage. Old RIGHT basal ganglia infarct with mineralization. Ex vacuo dilatation RIGHT frontal horn of lateral ventricle. Mild general parenchymal brain volume loss for age. Patchy supratentorial white matter FLAIR T2 hyperintensities exclusive of the aforementioned abnormality. No midline shift, mass effect or masses. VASCULAR: Normal major intracranial vascular flow voids present at skull base. SKULL AND UPPER CERVICAL SPINE: No abnormal sellar expansion. No suspicious calvarial bone marrow signal. Craniocervical junction maintained. SINUSES/ORBITS: Moderate paranasal sinus mucosal thickening. Mastoid air cells are well aerated. Included ocular globes and orbital contents are non-suspicious. OTHER: None. IMPRESSION: 1. 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 Mild-to-moderate chronic small vessel ischemic disease. 2. Old RIGHT basal ganglia infarct. Mild parenchymal brain volume loss for age. Electronically Signed   By: Awilda Metroourtnay  Bloomer M.D.   On: 04/28/2017 00:35    Scheduled Meds: . [START ON 04/29/2017] aspirin EC  81 mg Oral Daily  .  atorvastatin  20 mg Oral q1800  . cilostazol  100 mg Oral BID  . [START ON 04/29/2017] feeding supplement (GLUCERNA SHAKE)  237 mL Oral BID BM  . insulin aspart  0-5 Units Subcutaneous QHS  . insulin aspart  0-9 Units Subcutaneous TID WC  . isosorbide mononitrate  30 mg Oral Daily  . metoprolol tartrate  12.5 mg Oral BID  . mirtazapine  15 mg Oral QHS  . multivitamin with minerals  1 tablet Oral Daily  . nicotine  14 mg Transdermal Daily  . sertraline  100 mg Oral Daily  . sodium chloride flush  3 mL Intravenous Q12H  . sodium chloride flush  3 mL Intravenous Q12H   Continuous Infusions: . sodium chloride    . sodium chloride    . sodium chloride 1 mL/kg/hr (04/28/17 1610)    Active Problems:   NSTEMI (non-ST elevated myocardial  infarction) (HCC)   Accelerated hypertension   Hyperlipidemia   Tobacco abuse disorder   Altered mental status   PAD (peripheral artery disease) (HCC)   Pressure injury of skin   Protein calorie malnutrition (HCC)   Physical deconditioning   Ulcer of great toe (HCC)   Claudication (HCC)   Sacral decubitus ulcer   AKI (acute kidney injury) (HCC)   Malnutrition of moderate degree    Time spent: 25 minutes   Vassie Loll  [facility] Pager [phone]. If 7PM-7AM, please contact night-coverage at [email or web address], password [facility] 04/28/2017, 4:28 PM  LOS: 3 days

## 2017-04-28 NOTE — Progress Notes (Signed)
Inpatient Diabetes Program Recommendations  AACE/ADA: New Consensus Statement on Inpatient Glycemic Control (2015)  Target Ranges:  Prepandial:   less than 140 mg/dL      Peak postprandial:   less than 180 mg/dL (1-2 hours)      Critically ill patients:  140 - 180 mg/dL   Lab Results  Component Value Date   GLUCAP 242 (H) 04/28/2017   HGBA1C 7.4 (H) 04/25/2017    Review of Glycemic Control  Results for [name], Jordan Gardner (MRN [id number]) as of 04/28/2017 11:54  Ref. Range 04/27/2017 11:21 04/27/2017 16:26 04/27/2017 21:15 04/28/2017 07:24 04/28/2017 11:40  Glucose-Capillary Latest Ref Range: 65 - 99 mg/dL 782118 (H) 956226 (H) 213264 (H) 167 (H) 242 (H)    Diabetes history: Type 2 Outpatient Diabetes medications: Metformin 1000mg  bid Current orders for Inpatient glycemic control: Novolog 0-9 units tid, Novolog 0-5 units qhs  Inpatient Diabetes Program Recommendations:  Blood sugars elevated at times likely as a result of poorly timed Novolog insulin.  Please ensure Novolog is given within 1 hour of the blood sugar being taken.  Susette RacerJulie Kenidy Crossland, RN, BA, MHA, CDE Diabetes Coordinator Inpatient Diabetes Program  [phone] (Team Pager) [phone] [facility] Office) 04/28/2017 2:45 PM

## 2017-04-28 NOTE — NC FL2 (Addendum)
[place] MEDICAID FL2 LEVEL OF CARE SCREENING TOOL     IDENTIFICATION  Patient Name: Jordan Gardner Birthdate: 07/18/1951 Sex: male Admission Date (Current Location): 04/25/2017  [facility] [place]   [facility] and Address[facility]  [facility] [address], [place], [place] [address]      Provider Number: [id number]  Attending Physician Name and Address:  Laqueta LindenKoneswaran, Suresh A, MD  Relative Name and Phone Number:  Helen HashimotoKatrina Ford, sister, [phone]    Current Level of Care: Hospital Recommended Level of Care: Skilled Nursing Facility Prior Approval Number:    Date Approved/Denied:   PASRR Number:   [id number]  Discharge Plan: SNF    Current Diagnoses: Patient Active Problem List   Diagnosis Date Noted  . AKI (acute kidney injury) (HCC) 04/28/2017  . Pressure injury of skin 04/27/2017  . Protein calorie malnutrition (HCC) 04/27/2017  . Physical deconditioning 04/27/2017  . Ulcer of great toe (HCC) 04/27/2017  . Claudication (HCC) 04/27/2017  . Sacral decubitus ulcer 04/27/2017  . Accelerated hypertension   . Hyperlipidemia   . Tobacco abuse disorder   . Altered mental status   . PAD (peripheral artery disease) (HCC)   . NSTEMI (non-ST elevated myocardial infarction) (HCC) 04/25/2017  . NECK PAIN 10/16/2008  . BACK PAIN 10/16/2008    Orientation RESPIRATION BLADDER Height & Weight     Self, Place, Situation  Normal Continent Weight: 175 lb 3.2 oz (79.5 kg) Height:  6\' 1"  (185.4 cm)  BEHAVIORAL SYMPTOMS/MOOD NEUROLOGICAL BOWEL NUTRITION STATUS      Continent Diet (heart healthy/carb modified; please see DC summary)  AMBULATORY STATUS COMMUNICATION OF NEEDS Skin   Limited Assist Verbally PU Stage and Appropriate Care (stage II mid sacrum, foam dressing; diabetic ulcer R distal toe, no dressing)                       Personal Care Assistance Level of Assistance  Bathing, Feeding, Dressing Bathing Assistance:  Limited assistance Feeding assistance: Independent Dressing Assistance: Limited assistance     Functional Limitations Info  Sight, Hearing, Speech Sight Info: Adequate Hearing Info: Adequate Speech Info: Adequate    SPECIAL CARE FACTORS FREQUENCY  OT (By licensed OT)     PT Frequency: 5x/week OT Frequency: 5x/week            Contractures Contractures Info: Not present    Additional Factors Info  Code Status, Allergies, Psychotropic, Insulin Sliding Scale Code Status Info: Full Allergies Info: No Known Allergies Psychotropic Info: remeron, zoloft Insulin Sliding Scale Info: insulin 3x/day with meals and insulin at bedtime       Current Medications (04/28/2017):  This is the current hospital active medication list Current Facility-Administered Medications  Medication Dose Route Frequency Provider Last Rate Last Dose  . 0.9 %  sodium chloride infusion  250 mL Intravenous PRN Nada BoozerIngold, Laura R, NP      . 0.9 %  sodium chloride infusion  250 mL Intravenous PRN Dunn, Dayna N, PA-C      . 0.9% sodium chloride infusion  1 mL/kg/hr Intravenous Continuous Dunn, Dayna N, PA-C 79.4 mL/hr at 04/28/17 0652 1 mL/kg/hr at 04/28/17 0652  . acetaminophen (TYLENOL) tablet 650 mg  650 mg Oral Q4H PRN Leone BrandIngold, Laura R, NP   650 mg at 04/28/17 0536  . ALPRAZolam Prudy Feeler(XANAX) tablet 0.25 mg  0.25 mg Oral BID PRN Leone BrandIngold, Laura R, NP      . [START ON 04/29/2017] aspirin EC  tablet 81 mg  81 mg Oral Daily Prentice Docker A, MD      . atorvastatin (LIPITOR) tablet 20 mg  20 mg Oral q1800 Lars Masson, MD      . cilostazol (PLETAL) tablet 100 mg  100 mg Oral BID Leone Brand, NP   100 mg at 04/28/17 0949  . insulin aspart (novoLOG) injection 0-5 Units  0-5 Units Subcutaneous QHS Leone Brand, NP   3 Units at 04/27/17 2122  . insulin aspart (novoLOG) injection 0-9 Units  0-9 Units Subcutaneous TID WC Leone Brand, NP   3 Units at 04/28/17 1214  . isosorbide mononitrate (IMDUR) 24 hr tablet 30  mg  30 mg Oral Daily Dunn, Dayna N, PA-C   30 mg at 04/28/17 0950  . metoprolol tartrate (LOPRESSOR) tablet 12.5 mg  12.5 mg Oral BID Lars Masson, MD   12.5 mg at 04/28/17 1316  . mirtazapine (REMERON) tablet 15 mg  15 mg Oral QHS Leone Brand, NP   15 mg at 04/27/17 2115  . naloxone [facility]) injection 0.4 mg  0.4 mg Intravenous PRN Timoteo Expose T, MD      . nicotine (NICODERM CQ - dosed in mg/24 hours) patch 14 mg  14 mg Transdermal Daily Leone Brand, NP   14 mg at 04/28/17 1610  . nitroGLYCERIN (NITROSTAT) SL tablet 0.4 mg  0.4 mg Sublingual Q5 Min x 3 PRN Leone Brand, NP      . ondansetron [facility]) injection 4 mg  4 mg Intravenous Q6H PRN Leone Brand, NP      . sertraline (ZOLOFT) tablet 100 mg  100 mg Oral Daily Nada Boozer R, NP   100 mg at 04/28/17 0951  . sodium chloride flush (NS) 0.9 % injection 3 mL  3 mL Intravenous Q12H Leone Brand, NP   3 mL at 04/27/17 1000  . sodium chloride flush (NS) 0.9 % injection 3 mL  3 mL Intravenous PRN Nada Boozer R, NP      . sodium chloride flush (NS) 0.9 % injection 3 mL  3 mL Intravenous Q12H Dunn, Dayna N, PA-C      . sodium chloride flush (NS) 0.9 % injection 3 mL  3 mL Intravenous PRN Dunn, Dayna N, PA-C      . zolpidem (AMBIEN) tablet 5 mg  5 mg Oral QHS PRN Leone Brand, NP   5 mg at 04/28/17 9604     Discharge Medications: Please see discharge summary for a list of discharge medications.  Relevant Imaging Results:  Relevant Lab Results:   Additional Information SSN: [id number]  Abigail Butts, LCSW

## 2017-04-28 NOTE — Clinical Social Work Note (Signed)
Clinical Social Work Assessment  Patient Details  Name: Jordan Gardner MRN: [id number] Date of Birth: 1951-03-17  Date of referral:  04/28/17               Reason for consult:  Facility Placement                Permission sought to share information with:  Family Supports, Customer service manager Permission granted to share information::  Yes, Verbal Permission Granted  Name::     Jerene Pitch  Agency::  SNFs  Relationship::  sister  Contact Information:  [phone]  Housing/Transportation Living arrangements for the past 2 months:  [place] of Information:  Patient, Other (Comment Required) (sister) Patient Interpreter Needed:  None Criminal Activity/Legal Involvement Pertinent to Current Situation/Hospitalization:  No - Comment as needed Significant Relationships:  Siblings Lives with:  Siblings Do you feel safe going back to the place where you live?  Yes Need for family participation in patient care:  No (Coment)  Care giving concerns: Patient from home with sister. Sister at bedside.   Social Worker assessment / plan: CSW met with patient and sister at bedside and discussed recommendation for SNF. Patient and sister agreeable to [facility]. CSW sent out initial referrals. Will need to obtain [facility] authorization. PASRR also under review due to history of mental health diagnosis. CSW to continue to follow and support with discharge to SNF.  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  Patient/Family's Understanding of and Emotional Response to Diagnosis, Current Treatment, and Prognosis: Patient and sister hopeful for mobility recovery at SNF before patient returns home.  Emotional Assessment Appearance:  Appears stated  age Attitude/Demeanor/Rapport:  Other (Flat) Affect (typically observed):  Flat Orientation:  Oriented to Self, Oriented to Place, Oriented to  Time, Oriented to Situation Alcohol / Substance use:  Not Applicable Psych involvement (Current and /or in the community):  No (Comment)  Discharge Needs  Concerns to be addressed:  Discharge Planning Concerns Readmission within the last 30 days:  No Current discharge risk:  Physical Impairment Barriers to Discharge:  Continued Medical Work up   Estanislado Emms, LCSW 04/28/2017, 1:56 PM

## 2017-04-29 ENCOUNTER — Encounter (HOSPITAL_COMMUNITY): Payer: Self-pay | Admitting: General Practice

## 2017-04-29 DIAGNOSIS — L97509 Non-pressure chronic ulcer of other part of unspecified foot with unspecified severity: Secondary | ICD-10-CM

## 2017-04-29 DIAGNOSIS — F331 Major depressive disorder, recurrent, moderate: Secondary | ICD-10-CM

## 2017-04-29 DIAGNOSIS — L89159 Pressure ulcer of sacral region, unspecified stage: Secondary | ICD-10-CM

## 2017-04-29 DIAGNOSIS — E440 Moderate protein-calorie malnutrition: Secondary | ICD-10-CM

## 2017-04-29 DIAGNOSIS — F17210 Nicotine dependence, cigarettes, uncomplicated: Secondary | ICD-10-CM

## 2017-04-29 DIAGNOSIS — R5381 Other malaise: Secondary | ICD-10-CM

## 2017-04-29 DIAGNOSIS — L8990 Pressure ulcer of unspecified site, unspecified stage: Secondary | ICD-10-CM

## 2017-04-29 DIAGNOSIS — E46 Unspecified protein-calorie malnutrition: Secondary | ICD-10-CM

## 2017-04-29 DIAGNOSIS — L89152 Pressure ulcer of sacral region, stage 2: Secondary | ICD-10-CM

## 2017-04-29 LAB — BASIC METABOLIC PANEL
ANION GAP: 3 — AB (ref 5–15)
BUN: 20 mg/dL (ref 6–20)
CHLORIDE: 108 mmol/L (ref 101–111)
CO2: 24 mmol/L (ref 22–32)
Calcium: 8.2 mg/dL — ABNORMAL LOW (ref 8.9–10.3)
Creatinine, Ser: 1.31 mg/dL — ABNORMAL HIGH (ref 0.61–1.24)
GFR calc Af Amer: >60 mL/min (ref >60)
GFR calc non Af Amer: 56 mL/min — ABNORMAL LOW (ref >60)
Glucose, Bld: 169 mg/dL — ABNORMAL HIGH (ref 65–99)
POTASSIUM: 4.5 mmol/L (ref 3.5–5.1)
SODIUM: 135 mmol/L (ref 135–145)

## 2017-04-29 LAB — CBC
HEMATOCRIT: 32.1 % — AB (ref 39.0–52.0)
HEMOGLOBIN: 10.4 g/dL — AB (ref 13.0–17.0)
MCH: 27.4 pg (ref 26.0–34.0)
MCHC: 32.4 g/dL (ref 30.0–36.0)
MCV: 84.7 fL (ref 78.0–100.0)
Platelets: 137 10*3/uL — ABNORMAL LOW (ref 150–400)
RBC: 3.79 MIL/uL — AB (ref 4.22–5.81)
RDW: 13.6 % (ref 11.5–15.5)
WBC: 6.2 10*3/uL (ref 4.0–10.5)

## 2017-04-29 LAB — GLUCOSE, CAPILLARY
GLUCOSE-CAPILLARY: 130 mg/dL — AB (ref 65–99)
GLUCOSE-CAPILLARY: 241 mg/dL — AB (ref 65–99)
Glucose-Capillary: 223 mg/dL — ABNORMAL HIGH (ref 65–99)

## 2017-04-29 MED ORDER — ADULT MULTIVITAMIN W/MINERALS CH: 1.0000 | ORAL_TABLET | Freq: Every day | ORAL | Status: AC

## 2017-04-29 MED ORDER — INSULIN ASPART 100 UNIT/ML ~~LOC~~ SOLN: 0.0000 [IU] | Freq: Three times a day (TID) | SUBCUTANEOUS | Status: DC

## 2017-04-29 MED ORDER — GLUCERNA SHAKE PO LIQD: 237.0000 mL | Freq: Two times a day (BID) | ORAL | 0 refills | Status: DC

## 2017-04-29 MED ORDER — INSULIN ASPART 100 UNIT/ML ~~LOC~~ SOLN: 3.0000 [IU] | Freq: Three times a day (TID) | SUBCUTANEOUS | Status: DC

## 2017-04-29 MED ORDER — NICOTINE 14 MG/24HR TD PT24: 14.0000 mg | MEDICATED_PATCH | Freq: Every day | TRANSDERMAL | 0 refills | Status: DC

## 2017-04-29 MED ORDER — ENSURE ENLIVE PO LIQD: 237.0000 mL | Freq: Two times a day (BID) | ORAL | Status: DC

## 2017-04-29 MED ORDER — ATORVASTATIN CALCIUM 80 MG PO TABS: 80.0000 mg | ORAL_TABLET | Freq: Every day | ORAL | Status: DC

## 2017-04-29 MED ORDER — ASPIRIN 81 MG PO TBEC: 81.0000 mg | DELAYED_RELEASE_TABLET | Freq: Every day | ORAL | Status: AC

## 2017-04-29 MED ORDER — ISOSORBIDE MONONITRATE ER 30 MG PO TB24: 30.0000 mg | ORAL_TABLET | Freq: Every day | ORAL | Status: DC

## 2017-04-29 MED ORDER — NITROGLYCERIN 0.4 MG SL SUBL: 0.4000 mg | SUBLINGUAL_TABLET | SUBLINGUAL | 12 refills | Status: AC | PRN

## 2017-04-29 NOTE — Progress Notes (Addendum)
Inpatient Diabetes Program Recommendations  AACE/ADA: New Consensus Statement on Inpatient Glycemic Control (2015)  Target Ranges:  Prepandial:   less than 140 mg/dL      Peak postprandial:   less than 180 mg/dL (1-2 hours)      Critically ill patients:  140 - 180 mg/dL   Lab Results  Component Value Date   GLUCAP 241 (H) 04/29/2017   HGBA1C 7.4 (H) 04/25/2017   Review of Glycemic Control  Diabetes history: DM 2 Outpatient Diabetes medications: Metformin 1000 mg BID Current orders for Inpatient glycemic control: Novolog Sensitive 0-9 units tid + Novolog HS scale  Inpatient Diabetes Program Recommendations:    Glucose trend 200's. If patient is not d/c'd, consider Novolog Moderate Correction 0-15 units tid.  Thanks,  Christena DeemShannon Laryssa Hassing RN, MSN, [facility] Inpatient Diabetes Coordinator Team Pager [phone] (8a-5p)

## 2017-04-29 NOTE — Progress Notes (Signed)
Report was called to receiving RN at [facility]; all questions answered.

## 2017-04-29 NOTE — Clinical Social Work Placement (Signed)
   CLINICAL SOCIAL WORK PLACEMENT  NOTE  Date:  04/29/2017  Patient Details  Name: Jordan Gardner MRN: [id number] Date of Birth: 05/04/1951  Clinical Social Work is seeking post-discharge placement for this patient at the Skilled  Nursing Facility level of care (*CSW will initial, date and re-position this form in  chart as items are completed):  Yes   Patient/family provided with [facility] Clinical Social Work Department's list of facilities offering this level of care within the geographic area requested by the patient (or if unable, by the patient's family).  Yes   Patient/family informed of their freedom to choose among providers that offer the needed level of care, that participate in Medicare, Medicaid or managed care program needed by the patient, have an available bed and are willing to accept the patient.  Yes   Patient/family informed of [facility]'s ownership interest in [facility] and [facility], as well as of the fact that they are under no obligation to receive care at these facilities.  PASRR submitted to EDS on 04/28/17     PASRR number received on 04/29/17     Existing PASRR number confirmed on       FL2 transmitted to all facilities in geographic area requested by pt/family on 04/28/17     FL2 transmitted to all facilities within larger geographic area on       Patient informed that his/her managed care company has contracts with or will negotiate with certain facilities, including the following:  [facility]     Yes   Patient/family informed of bed offers received.  [facility]     Physician recommends and patient chooses bed at      Patient to be [facility] on 04/29/17.  Patient to be transferred to facility by PTAR     Patient family notified on 04/29/17 of transfer.  Name of family member notified:  Jordan Gardner, sister     PHYSICIAN       Additional Comment:     _______________________________________________ Abigail ButtsSusan Martese Vanatta, LCSW 04/29/2017, 4:11 PM

## 2017-04-29 NOTE — Consult Note (Signed)
[facility] Face-to-Face Psychiatry Consult   Reason for Consult:  AMS Referring Physician:  Dr. Wynetta Emery Patient Identification: Jordan Gardner MRN:  [id number] Principal Diagnosis: <principal problem not specified> Diagnosis:   Patient Active Problem List   Diagnosis Date Noted  . AKI (acute kidney injury) ([place]) [N17.9] 04/28/2017  . Malnutrition of moderate degree [E44.0] 04/28/2017  . Pressure injury of skin [L89.90] 04/27/2017  . Protein calorie malnutrition ([place]) [E46] 04/27/2017  . Physical deconditioning [R53.81] 04/27/2017  . Ulcer of great toe ([place]) [L97.509] 04/27/2017  . Claudication ([place]) [I73.9] 04/27/2017  . Sacral decubitus ulcer [L89.159] 04/27/2017  . Accelerated hypertension [I10]   . Hyperlipidemia [E78.5]   . Tobacco abuse disorder [Z72.0]   . Altered mental status [R41.82]   . PAD (peripheral artery disease) ([place]) [I73.9]   . NSTEMI (non-ST elevated myocardial infarction) ([place]) [I21.4] 04/25/2017  . NECK PAIN [M54.2] 10/16/2008  . BACK PAIN [M54.9] 10/16/2008    Total Time spent with patient: 1 hour  Subjective:   Jordan Gardner is a 66 y.o. male patient admitted with chest pain.  HPI:  Jordan Gardner is a 66 y.o. male with a history of CAD, CABG 5 years ago at [facility], + tobacco 2ppd, DM-2, HLD and disability walks with walker.  Patient seen, chart reviewed for this face-to-face psychiatry consultation and evaluation of increased symptoms of depression. Patient reported that he has been feeling much better since came to the hospital and has mild tenderness on his left chest wall. Patient cardiac examination and evaluation revealed negative for myocardial infarction. Patient endorses mild symptoms of depression but no anxiety, mania or psychotic symptoms. Patient denied suicidal/homicidal ideation, intention or plans. Patient reported he will ask his outpatient provider regarding changing his medication and requested not to change his psychotropic medications  during this visit. Past Psychiatric History: Patient has no history of acute psychiatric hospitalization reportedly taking antidepressant medication from primary care physician at [facility].  Risk to Self: Is patient at risk for suicide?: No Risk to Others:   Prior Inpatient Therapy:   Prior Outpatient Therapy:    Past Medical History:  Past Medical History:  Diagnosis Date  . Coronary artery disease   . Diabetes mellitus without complication ([place])   . Hyperlipidemia   . Hypertension   . PAD (peripheral artery disease) ([place])   . Protein calorie malnutrition ([place]) 04/2017  . 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 Unsure of Dx. Pt sleeps late and difficult to wake. odd affect  . Sacral decubitus ulcer 04/2017    Past Surgical History:  Procedure Laterality Date  . BACK SURGERY    . CARDIAC CATHETERIZATION    . CARDIAC SURGERY     CABG x5 2012 - [facility]   Family History:  Family History  Problem Relation Age of Onset  . Hypertension Sister    Family Psychiatric  History: Unknown Social History:  History  Alcohol Use No     History  Drug Use No    Social History   Social History  . Marital status: Single    Spouse name: N/A  . Number of children: N/A  . Years of education: N/A   Social History Main Topics  . Smoking status: Current Every Day Smoker    Packs/day: 2.00    Types: Cigarettes  . Smokeless tobacco: Never Used  . Alcohol use No  . Drug use: No  . Sexual activity: Not Asked   Other Topics Concern  . None  Social History Narrative  . None   Additional Social History:    Allergies:  No Known Allergies  Labs:  Results for orders placed or performed during the hospital encounter of 04/25/17 (from the past 48 hour(s))  Glucose, capillary     Status: Abnormal   Collection Time: 04/27/17  4:26 PM  Result Value Ref Range   Glucose-Capillary 226 (H) 65 - 99 mg/dL  Glucose, capillary     Status: Abnormal   Collection Time: 04/27/17   9:15 PM  Result Value Ref Range   Glucose-Capillary 264 (H) 65 - 99 mg/dL  CBC     Status: Abnormal   Collection Time: 04/28/17  4:41 AM  Result Value Ref Range   WBC 6.2 4.0 - 10.5 K/uL   RBC 3.90 (L) 4.22 - 5.81 MIL/uL   Hemoglobin 10.5 (L) 13.0 - 17.0 g/dL   HCT 33.0 (L) 39.0 - 52.0 %   MCV 84.6 78.0 - 100.0 fL   MCH 26.9 26.0 - 34.0 pg   MCHC 31.8 30.0 - 36.0 g/dL   RDW 13.8 11.5 - 15.5 %   Platelets 146 (L) 150 - 400 K/uL  Heparin level (unfractionated)     Status: None   Collection Time: 04/28/17  4:41 AM  Result Value Ref Range   Heparin Unfractionated 0.61 0.30 - 0.70 IU/mL    Comment:        IF HEPARIN RESULTS ARE BELOW EXPECTED VALUES, AND PATIENT DOSAGE HAS BEEN CONFIRMED, SUGGEST FOLLOW UP TESTING OF ANTITHROMBIN III LEVELS.   Basic metabolic panel     Status: Abnormal   Collection Time: 04/28/17  4:41 AM  Result Value Ref Range   Sodium 134 (L) 135 - 145 mmol/L   Potassium 4.8 3.5 - 5.1 mmol/L   Chloride 106 101 - 111 mmol/L   CO2 23 22 - 32 mmol/L   Glucose, Bld 199 (H) 65 - 99 mg/dL   BUN 26 (H) 6 - 20 mg/dL   Creatinine, Ser 1.50 (H) 0.61 - 1.24 mg/dL   Calcium 8.2 (L) 8.9 - 10.3 mg/dL   GFR calc non Af Amer 47 (L) >60 mL/min   GFR calc Af Amer 55 (L) >60 mL/min    Comment: (NOTE) The eGFR has been calculated using the CKD EPI equation. This calculation has not been validated in all clinical situations. eGFR's persistently <60 mL/min signify possible Chronic Kidney Disease.    Anion gap 5 5 - 15  Protime-INR     Status: None   Collection Time: 04/28/17  4:41 AM  Result Value Ref Range   Prothrombin Time 14.2 11.4 - 15.2 seconds   INR 1.11   Glucose, capillary     Status: Abnormal   Collection Time: 04/28/17  7:24 AM  Result Value Ref Range   Glucose-Capillary 167 (H) 65 - 99 mg/dL  Glucose, capillary     Status: Abnormal   Collection Time: 04/28/17 11:40 AM  Result Value Ref Range   Glucose-Capillary 242 (H) 65 - 99 mg/dL  Glucose, capillary      Status: Abnormal   Collection Time: 04/28/17  4:52 PM  Result Value Ref Range   Glucose-Capillary 206 (H) 65 - 99 mg/dL  Glucose, capillary     Status: Abnormal   Collection Time: 04/28/17  9:31 PM  Result Value Ref Range   Glucose-Capillary 265 (H) 65 - 99 mg/dL  CBC     Status: Abnormal   Collection Time: 04/29/17  3:20 AM  Result Value  Ref Range   WBC 6.2 4.0 - 10.5 K/uL   RBC 3.79 (L) 4.22 - 5.81 MIL/uL   Hemoglobin 10.4 (L) 13.0 - 17.0 g/dL   HCT 32.1 (L) 39.0 - 52.0 %   MCV 84.7 78.0 - 100.0 fL   MCH 27.4 26.0 - 34.0 pg   MCHC 32.4 30.0 - 36.0 g/dL   RDW 13.6 11.5 - 15.5 %   Platelets 137 (L) 150 - 400 K/uL  Basic metabolic panel     Status: Abnormal   Collection Time: 04/29/17  3:20 AM  Result Value Ref Range   Sodium 135 135 - 145 mmol/L   Potassium 4.5 3.5 - 5.1 mmol/L   Chloride 108 101 - 111 mmol/L   CO2 24 22 - 32 mmol/L   Glucose, Bld 169 (H) 65 - 99 mg/dL   BUN 20 6 - 20 mg/dL   Creatinine, Ser 1.31 (H) 0.61 - 1.24 mg/dL   Calcium 8.2 (L) 8.9 - 10.3 mg/dL   GFR calc non Af Amer 56 (L) >60 mL/min   GFR calc Af Amer >60 >60 mL/min    Comment: (NOTE) The eGFR has been calculated using the CKD EPI equation. This calculation has not been validated in all clinical situations. eGFR's persistently <60 mL/min signify possible Chronic Kidney Disease.    Anion gap 3 (L) 5 - 15  Glucose, capillary     Status: Abnormal   Collection Time: 04/29/17  7:35 AM  Result Value Ref Range   Glucose-Capillary 130 (H) 65 - 99 mg/dL  Glucose, capillary     Status: Abnormal   Collection Time: 04/29/17 11:07 AM  Result Value Ref Range   Glucose-Capillary 241 (H) 65 - 99 mg/dL   Comment 1 Notify RN     Current Facility-Administered Medications  Medication Dose Route Frequency Provider Last Rate Last Dose  . 0.9 %  sodium chloride infusion  250 mL Intravenous PRN Cecilie Kicks R, NP      . 0.9 %  sodium chloride infusion  250 mL Intravenous PRN Dunn, Dayna N, PA-C 10 mL/hr at  04/28/17 2230 250 mL at 04/28/17 2230  . 0.9% sodium chloride infusion  1 mL/kg/hr Intravenous Continuous Dunn, Dayna N, PA-C 79.4 mL/hr at 04/28/17 0652 1 mL/kg/hr at 04/28/17 0652  . acetaminophen (TYLENOL) tablet 650 mg  650 mg Oral Q4H PRN Isaiah Serge, NP   650 mg at 04/29/17 0840  . ALPRAZolam Duanne Moron) tablet 0.25 mg  0.25 mg Oral BID PRN Isaiah Serge, NP   0.25 mg at 04/29/17 0256  . aspirin EC tablet 81 mg  81 mg Oral Daily Herminio Commons, MD   81 mg at 04/29/17 0840  . atorvastatin (LIPITOR) tablet 20 mg  20 mg Oral q1800 Dorothy Spark, MD   20 mg at 04/28/17 1816  . cilostazol (PLETAL) tablet 100 mg  100 mg Oral BID Isaiah Serge, NP   100 mg at 04/29/17 0840  . feeding supplement (ENSURE ENLIVE) (ENSURE ENLIVE) liquid 237 mL  237 mL Oral BID BM Johnson, Clanford L, MD      . feeding supplement (GLUCERNA SHAKE) (GLUCERNA SHAKE) liquid 237 mL  237 mL Oral BID BM Kate Sable A, MD      . insulin aspart (novoLOG) injection 0-5 Units  0-5 Units Subcutaneous QHS Isaiah Serge, NP   3 Units at 04/28/17 0300  . insulin aspart (novoLOG) injection 0-9 Units  0-9 Units Subcutaneous TID WC Isaiah Serge,  NP   3 Units at 04/29/17 1204  . isosorbide mononitrate (IMDUR) 24 hr tablet 30 mg  30 mg Oral Daily Dunn, Dayna N, PA-C   30 mg at 04/29/17 0840  . metoprolol tartrate (LOPRESSOR) tablet 12.5 mg  12.5 mg Oral BID Dorothy Spark, MD   12.5 mg at 04/29/17 0840  . mirtazapine (REMERON) tablet 15 mg  15 mg Oral QHS Isaiah Serge, NP   15 mg at 04/28/17 2231  . multivitamin with minerals tablet 1 tablet  1 tablet Oral Daily Herminio Commons, MD   1 tablet at 04/29/17 0840  . naloxone [facility]) injection 0.4 mg  0.4 mg Intravenous PRN Geralynn Ochs T, MD      . nicotine (NICODERM CQ - dosed in mg/24 hours) patch 14 mg  14 mg Transdermal Daily Isaiah Serge, NP   14 mg at 04/29/17 0839  . nitroGLYCERIN (NITROSTAT) SL tablet 0.4 mg  0.4 mg Sublingual Q5 Min x 3 PRN  Isaiah Serge, NP      . ondansetron [facility]) injection 4 mg  4 mg Intravenous Q6H PRN Isaiah Serge, NP      . sertraline (ZOLOFT) tablet 100 mg  100 mg Oral Daily Cecilie Kicks R, NP   100 mg at 04/29/17 0840  . sodium chloride flush (NS) 0.9 % injection 3 mL  3 mL Intravenous Q12H Isaiah Serge, NP   3 mL at 04/28/17 2238  . sodium chloride flush (NS) 0.9 % injection 3 mL  3 mL Intravenous PRN Cecilie Kicks R, NP      . sodium chloride flush (NS) 0.9 % injection 3 mL  3 mL Intravenous Q12H Dunn, Dayna N, PA-C   3 mL at 04/28/17 2239  . sodium chloride flush (NS) 0.9 % injection 3 mL  3 mL Intravenous PRN Dunn, Dayna N, PA-C      . zolpidem (AMBIEN) tablet 5 mg  5 mg Oral QHS PRN Isaiah Serge, NP   5 mg at 04/28/17 0055    Musculoskeletal: Strength & Muscle Tone: decreased Gait & Station: unable to stand Patient leans: N/A  Psychiatric Specialty Exam: Physical Exam  as per history and physical  ROS patient complaining about chest wall tenderness, sometimes breathing difficulties but denied nausea, vomiting, abdominal pain and shortness of breath or chest pain this morning.  No Fever-chills, No Headache, No changes with Vision or hearing, reports vertigo No problems swallowing food or Liquids, No Chest pain, Cough or Shortness of Breath, No Abdominal pain, No Nausea or Vommitting, Bowel movements are regular, No Blood in stool or Urine, No dysuria, No new skin rashes or bruises, No new joints pains-aches,  No new weakness, tingling, numbness in any extremity, No recent weight gain or loss, No polyuria, polydypsia or polyphagia,  A full 10 point Review of Systems was done, except as stated above, all other Review of Systems were negative.  Blood pressure 109/67, pulse 63, temperature 98.1 F (36.7 C), temperature source Oral, resp. rate 17, height '6\' 1"'  (1.854 m), weight 89.8 kg (198 lb), SpO2 96 %.Body mass index is 26.12 kg/m.  General Appearance: Disheveled and Guarded   Eye Contact:  Good  Speech:  Clear and Coherent and Slow  Volume:  Decreased  Mood:  Depressed  Affect:  Constricted and Depressed  Thought Process:  Coherent and Goal Directed  Orientation:  Full (Time, Place, and Person)  Thought Content:  WDL and Logical  Suicidal Thoughts:  No  Homicidal Thoughts:  No  Memory:  Immediate;   Good Recent;   Fair Remote;   Fair  Judgement:  Fair  Insight:  Fair  Psychomotor Activity:  Decreased  Concentration:  Concentration: Fair and Attention Span: Fair  Recall:  [employer] of Knowledge:  Good  Language:  Good  Akathisia:  Negative  Handed:  Right  AIMS (if indicated):     Assets:  Communication Skills Desire for Improvement Financial Resources/Insurance Housing Leisure Time Resilience Social Support Transportation  ADL's:  Impaired  Cognition:  WNL  Sleep:        Treatment Plan Summary: 66 years old male with multiple medical problems presented with new onset chest pain and his cardiac examination is negative for myocardial infarction and continue to have tenderness on his chest. Patient reportedly suffering with the depression has been receiving outpatient medication management. Patient has depressed mood and constricted affect.   Maj. 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  Recommendation: Patient has no safety concerns during this visit Patient is willing to participate outpatient medication management after discharge from the hospital and requested not to make any changes with his psychotropic medication during this visit. Daily contact with patient to assess and evaluate symptoms and progress in treatment and Medication management  Disposition: Patient will be referred to the outpatient psychiatric medication management at [facility] clinic in [place], [place]  Patient does not meet criteria for psychiatric inpatient admission. Supportive therapy provided about ongoing stressors.  Ambrose Finland,  MD 04/29/2017 12:11 PM

## 2017-04-29 NOTE — Progress Notes (Signed)
[facility]. Anticipated discharge date: 04/29/17 Family notified: Helen HashimotoKatrina Ford, sister Transportation by: [facility]  Nurse to call report to [phone].   CSW signing off.  Abigail ButtsSusan Cariah Salatino, LCSWA  Clinical Social Worker

## 2017-04-29 NOTE — Care Management Important Message (Signed)
Important Message  Patient Details  Name: Jordan Gardner MRN: [id number] Date of Birth: 05/10/1951   Medicare Important Message Given:  Yes    [name] 04/29/2017, 11:03 AM

## 2017-04-29 NOTE — Progress Notes (Signed)
It appears that overnight this patient got transferred onto the hospitalist service as primary. Per review with Dr. Delton SeeNelson we feel this is appropriate because we do not plan any further CV interventions this admission and will be signing off - patient reports he feels good today without recurrence of the sx that prompted admission. As per prior notes, he declined cath, reserving for only refractory angina. Management of other issues per IM. BP remains on higher side so could consider addition of amlodipine to regimen given his elevated Cr this admission. [facility] should f/u there closely within 2 weeks of discharge. Please call with questions. Dayna Dunn PA-C

## 2017-04-29 NOTE — Discharge Summary (Addendum)
Physician Discharge Summary  Tejay Nusz BJY:[id number] DOB: [birth year] DOA: 04/25/2017  PCP: [facility]  Admit date: 04/25/2017 Discharge date: 04/29/2017  Disposition: SNF   Recommendations for Outpatient Follow-up:  1. Follow up with PCP in 2 weeks 2. Follow up with vascular surgery for severe PVD in 2 weeks 3. Follow up with [facility] cardiologist in 2 weeks  4. Follow up with [facility] psychiatrist in 2 - 3 weeks 5. Follow up with Dr. Lajoyce Corners to look at toe wound in 2 weeks 6. Please obtain BMP/CBC in one week 7. Monitor blood sugar 3 times per day and adjust treatment as needed.  8. Wound care consult for toe wound recommended 9. Avoid SMOKING  Discharge Condition: STABLE   CODE STATUS: FULL    Brief Hospitalization Summary: Please see all hospital notes, images, labs for full details of the hospitalization.  Briceson Hoffmeier is a 66 y.o. male with a history of CAD, CABG 5 years ago at [facility], + tobacco 2ppd, DM-2, HLD and disability walks with walker.    History of Present Illness:   Mr. Delo and his sister with whom he lives, [place] EMS around [place] for chest pain.  The pain went up into his neck and Lt arm with some SOB, no diaphoresis and no nausea.  He had no NTG.    Pt with hx CABG 5 years ago per his sister.  Done at [facility].  Pt is diabetic not on insulin, + chain smoker 2ppd.  Also HLD, PAD and depression.  He walks with a walker.  Currently after waking the pt he has continued mild chest pain and into head.  His BP is elevated.  He is on IV heparin.    EKG SR RBBB and LPFB. No changes from 12/2016  Second EKG similar 3rd EKG with mild ST elevation lateral leads also 4th though with block more difficult to see.  I personally reviewed.  Troponin < 0.03; 0.00; 0.19  And most recent 0.29  BNP 404 DDImer 1.05   Interim summary and HPI 66 y.o.maleCAD, diabetes, hypertension. Patient was admitted on 04/25/2017 due to concerns for CAD/NSTEMI with a presenting  complaint of chest pain and hypertensive urgency. 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 Patient smokes 1-2 packs of cigarettes per day. On subsequent days patient has continued to be somnolent and this engaged in conversation. He has been continued on IV nitroglycerin, the olmesartan, IV heparin, by mouth ASA and Lipitor. Patient currently is chest pain-free. Patient's only complaint is right great toe pain which has been persistent for several months. Additionally patient states that when he ambulates his legs ache. Patient really denies any palpitations, shortness breath, nausea, vomiting, dumping, dysuria, fevers, neck stiffness, focal neurological deficit.  Assessment/Plan: 1-CAD with presumed NSTEMI: -initially admitted for heparin drip and NTG with plans for cath by primary service -patient refusing cath -no CP, no SOB and preserved EF; medical management has been recommended - Pt to follow up with [facility] cardiologist in 2 weeks recommended.  2-AMS/flat affect/depression -normal ammonia, TSH and no acute findings on MRI or EEG -will benefit of antidepressant drugs that will not increase somnolence -psych has been consulted to see patient.  Follow up with [facility] psychiatrist recommended.     3-physical deconditioning, protein calorie malnutrition (moderate) and stage pressure injury -per PT SNF recommended -dietitian on board, will follow rec's for nutritional supplement  -continue overlay mattress and preventive measures  4-PAD: with right great toe ulceration -  no signs of superimposed infection  -continue pletal and aspirin - outpatient follow up with vascular surgery recommended, Also outpatient follow up with Dr. Lajoyce Corners orthopedics recommended.   5-HLD -continue atorvastatin, 80 mg daily .   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 Wall thickness wasincreased in a pattern of mild LVH. Systolic function was normal. The estimated ejection fraction was in the range of 60% to 65%. Doppler parameters are consistent with abnormal left ventricularrelaxation (grade 1 diastolic dysfunction). - Aortic valve: There was trivial regurgitation.   EEG: The EEG is abnormal and findings are suggestive of Nonspecific generalized cerebral dysfunction. Epileptiform activity was not seen during this recording.  Antibiotics:  None   Discharge Diagnoses:  Active Problems:   NSTEMI (non-ST elevated myocardial infarction) (HCC)   Accelerated hypertension   Hyperlipidemia   Tobacco abuse disorder   Altered mental status   PAD (peripheral artery disease) (HCC)   Pressure injury of skin   Protein calorie malnutrition (HCC)   Physical deconditioning   Ulcer of great toe (HCC)   Claudication (HCC)   Sacral decubitus ulcer   AKI (acute kidney injury) (HCC)   Malnutrition of moderate degree  Discharge Instructions: Discharge Instructions    Ambulatory referral to Vascular Surgery    Complete by:  As directed    Call MD for:  difficulty breathing, headache or visual disturbances    Complete by:  As directed    Call MD for:  extreme fatigue    Complete by:  As directed    Call MD for:  persistant dizziness or light-headedness    Complete by:  As directed    Call MD for:  persistant nausea and vomiting    Complete by:  As directed    Call MD for:  severe uncontrolled pain    Complete by:  As directed    Call MD for:  temperature >100.4    Complete by:  As directed    Increase activity slowly    Complete by:  As directed      Allergies as  of 04/29/2017   No Known Allergies     Medication List    TAKE these medications   aspirin 81 MG EC tablet Take 1 tablet (81 mg total) by mouth daily.   atorvastatin 80 MG tablet Commonly known as:  LIPITOR Take 1 tablet (80 mg total) by mouth daily at 6 PM. What changed:  medication strength  how much to take   cilostazol 100 MG tablet Commonly known as:  PLETAL Take 100 mg by mouth 2 (two) times daily.   feeding supplement (GLUCERNA SHAKE) Liqd Take 237 mLs by mouth 2 (two) times daily between meals.   isosorbide mononitrate 30 MG 24 hr tablet Commonly known as:  IMDUR Take 1 tablet (30 mg total) by mouth daily.   metFORMIN 500 MG tablet Commonly known as:  GLUCOPHAGE Take 1,000 mg by mouth 2 (two) times daily with a meal.   metoprolol tartrate 25 MG tablet Commonly known as:  LOPRESSOR Take 12.5 mg by mouth 2 (two) times daily.   mirtazapine 15  MG tablet Commonly known as:  REMERON Take 15 mg by mouth at bedtime.   multivitamin with minerals Tabs tablet Take 1 tablet by mouth daily.   nicotine 14 mg/24hr patch Commonly known as:  NICODERM CQ - dosed in mg/24 hours Place 1 patch (14 mg total) onto the skin daily.   nitroGLYCERIN 0.4 MG SL tablet Commonly known as:  NITROSTAT Place 1 tablet (0.4 mg total) under the tongue every 5 (five) minutes x 3 doses as needed for chest pain.   sertraline 100 MG tablet Commonly known as:  ZOLOFT Take 100 mg by mouth every morning.            Discharge Care Instructions        Start     Ordered   04/30/17 0000  aspirin EC 81 MG EC tablet  Daily     04/29/17 1446   04/30/17 0000  feeding supplement, GLUCERNA SHAKE, (GLUCERNA SHAKE) LIQD  2 times daily between meals     04/29/17 1446   04/30/17 0000  isosorbide mononitrate (IMDUR) 30 MG 24 hr tablet  Daily     04/29/17 1446   04/30/17 0000  Multiple Vitamin (MULTIVITAMIN WITH MINERALS) TABS tablet  Daily     04/29/17 1446   04/30/17 0000  nicotine (NICODERM  CQ - DOSED IN MG/24 HOURS) 14 mg/24hr patch  Daily     04/29/17 1446   04/29/17 0000  Ambulatory referral to Vascular Surgery     04/29/17 1443   04/29/17 0000  atorvastatin (LIPITOR) 80 MG tablet  Daily-1800     04/29/17 1446   04/29/17 0000  nitroGLYCERIN (NITROSTAT) 0.4 MG SL tablet  Every 5 min x3 PRN     04/29/17 1446   04/29/17 0000  Increase activity slowly     04/29/17 1446   04/29/17 0000  Call MD for:  temperature >100.4     04/29/17 1446   04/29/17 0000  Call MD for:  persistant nausea and vomiting     04/29/17 1446   04/29/17 0000  Call MD for:  severe uncontrolled pain     04/29/17 1446   04/29/17 0000  Call MD for:  difficulty breathing, headache or visual disturbances     04/29/17 1446   04/29/17 0000  Call MD for:  persistant dizziness or light-headedness     04/29/17 1446   04/29/17 0000  Call MD for:  extreme fatigue     04/29/17 1446      Contact information for follow-up providers    [facility] Schedule an appointment as soon as possible for a visit in 2 week(s).   Specialty:  General Practice Contact information: [address] [place] [address] [phone]        Larina Earthly, MD. Schedule an appointment as soon as possible for a visit in 2 week(s).   Specialties:  Vascular Surgery, Cardiology Contact information: [address] [place] [address] [phone]        Nadara Mustard, MD. Schedule an appointment as soon as possible for a visit in 2 week(s).   Specialty:  Orthopedic Surgery Why:  Hospital Follow up right toe  Contact information: [address] [place] [address] [phone]        [facility] Cardiologist Follow up in 2 week(s).   Why:  Follow up with your cardiologist at the [facility] in 2 weeks       [facility] Schedule an appointment as soon as possible  for a visit in 2 week(s).   Why:  Hospital Follow Up  Contact information: [address] [facility] [place]  [address] [phone]            Contact information for after-discharge care    Destination    [facility] Follow up.   Specialty:  Skilled Nursing Facility Contact information: [address] [place] [address] [phone]                 No Known Allergies Current Discharge Medication List    START taking these medications   Details  aspirin EC 81 MG EC tablet Take 1 tablet (81 mg total) by mouth daily.    feeding supplement, GLUCERNA SHAKE, (GLUCERNA SHAKE) LIQD Take 237 mLs by mouth 2 (two) times daily between meals. Refills: 0    isosorbide mononitrate (IMDUR) 30 MG 24 hr tablet Take 1 tablet (30 mg total) by mouth daily.    Multiple Vitamin (MULTIVITAMIN WITH MINERALS) TABS tablet Take 1 tablet by mouth daily.    nicotine (NICODERM CQ - DOSED IN MG/24 HOURS) 14 mg/24hr patch Place 1 patch (14 mg total) onto the skin daily. Qty: 28 patch, Refills: 0    nitroGLYCERIN (NITROSTAT) 0.4 MG SL tablet Place 1 tablet (0.4 mg total) under the tongue every 5 (five) minutes x 3 doses as needed for chest pain. Refills: 12      CONTINUE these medications which have CHANGED   Details  atorvastatin (LIPITOR) 80 MG tablet Take 1 tablet (80 mg total) by mouth daily at 6 PM.      CONTINUE these medications which have NOT CHANGED   Details  cilostazol (PLETAL) 100 MG tablet Take 100 mg by mouth 2 (two) times daily.    metFORMIN (GLUCOPHAGE) 500 MG tablet Take 1,000 mg by mouth 2 (two) times daily with a meal.    metoprolol tartrate (LOPRESSOR) 25 MG tablet Take 12.5 mg by mouth 2 (two) times daily.    mirtazapine (REMERON) 15 MG tablet Take 15 mg by mouth at bedtime.    sertraline (ZOLOFT) 100 MG tablet Take 100 mg by mouth every morning.       Procedures/Studies: Dg Chest 2 View  Result Date: 04/25/2017 CLINICAL DATA:  Chest pain radiating to the left neck and left arm. Smoker. EXAM: CHEST  2 VIEW COMPARISON:  12/23/2016 FINDINGS:  Postoperative changes in the mediastinum. Shallow inspiration with elevation of the left hemidiaphragm. Linear atelectasis or fibrosis in the left lung base and left mid lung is similar to previous study. No airspace disease or consolidation in the lungs. No blunting of costophrenic angles. No pneumothorax. Calcification of the aorta. IMPRESSION: Elevation of the left hemidiaphragm with linear fibrosis or atelectasis in the left mid and lower lungs similar previous study. No evidence of active pulmonary disease. Electronically Signed   By: Burman Nieves M.D.   On: 04/25/2017 02:02   Ct Head Wo Contrast  Result Date: 04/26/2017 CLINICAL DATA:  RIGHT facial droop. Last seen normal at 8 p.m. History of hypertension and diabetes. EXAM: CT HEAD WITHOUT CONTRAST TECHNIQUE: Contiguous axial images were obtained from the base of the skull through the vertex without intravenous contrast. COMPARISON:  CT HEAD Dec 23, 2016 FINDINGS: BRAIN: No intraparenchymal hemorrhage, mass effect nor midline shift. Old RIGHT basal ganglia infarct with ex vacuo dilatation subjacent ventricle. Mild parenchymal brain volume loss, advanced for age. No hydrocephalus. Patchy supratentorial white matter hypodensities including LEFT frontal subcortical white matter. No  abnormal extra-axial fluid collections. Basal cisterns are patent. VASCULAR: Moderate calcific atherosclerosis of the carotid siphons. SKULL: No skull fracture. No significant scalp soft tissue swelling. SINUSES/ORBITS: Moderate pan paranasal sinusitis. Mastoid air cells are well aerated.The included ocular globes and orbital contents are non-suspicious. OTHER: Poor dentition. ASPECTS [facility] Stroke Program Early CT Score) - Ganglionic level infarction (caudate, lentiform nuclei, internal capsule, insula, M1-M3 cortex): 7 - Supraganglionic infarction (M4-M6 cortex): 3 Total score (0-10 with 10 being normal): 10 IMPRESSION: 1. No acute intracranial process. 2. Mild to moderate  chronic small vessel ischemic disease, worse within LEFT frontal lobe. 3. Old RIGHT basal ganglia infarct. 4. Moderate parenchymal brain volume loss for age. 5. ASPECTS is 10. 6. Critical Value/emergent results were called by telephone at the time of interpretation on 04/26/2017 at 12:54 am to Dr. Amada Jupiter, Neurology , who verbally acknowledged these results. Electronically Signed   By: Awilda Metro M.D.   On: 04/26/2017 00:55   Mr Brain Wo Contrast  Result Date: 04/28/2017 CLINICAL DATA:  Altered level of consciousness, somnolent. Admitted for suspected myocardial infarction and hypertensive urgency. EXAM: MRI HEAD WITHOUT CONTRAST TECHNIQUE: Multiplanar, multiecho pulse sequences of the brain and surrounding structures were obtained without intravenous contrast. COMPARISON:  CT HEAD April 26, 2017 FINDINGS: BRAIN: Faint LEFT frontal subcortical reduced diffusion without ADC abnormality. No susceptibility artifact to suggest hemorrhage. Old RIGHT basal ganglia infarct with mineralization. Ex vacuo dilatation RIGHT frontal horn of lateral ventricle. Mild general parenchymal brain volume loss for age. Patchy supratentorial white matter FLAIR T2 hyperintensities exclusive of the aforementioned abnormality. No midline shift, mass effect or masses. VASCULAR: Normal major intracranial vascular flow voids present at skull base. SKULL AND UPPER CERVICAL SPINE: No abnormal sellar expansion. No suspicious calvarial bone marrow signal. Craniocervical junction maintained. SINUSES/ORBITS: Moderate paranasal sinus mucosal thickening. Mastoid air cells are well aerated. Included ocular globes and orbital contents are non-suspicious. OTHER: None. IMPRESSION: 1. 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 Mild-to-moderate chronic small vessel ischemic disease. 2. Old RIGHT basal ganglia infarct. Mild parenchymal brain volume loss for age. Electronically Signed   By: Awilda Metro M.D.    On: 04/28/2017 00:35   Dg Abd 2 Views  Result Date: 04/25/2017 CLINICAL DATA:  No bowel movement for over a week. History of diabetes and hypertension. EXAM: ABDOMEN - 2 VIEW COMPARISON:  None. FINDINGS: Gas and stool throughout the colon. No small or large bowel distention. No free intra-abdominal air. No abnormal air-fluid levels. No radiopaque stones. Vascular calcifications. Degenerative changes in the spine and hips. IMPRESSION: Nonobstructive bowel gas pattern with stool-filled colon. Electronically Signed   By: Burman Nieves M.D.   On: 04/25/2017 04:02   Ct Angio Chest/abd/pel For Dissection W And/or Wo Contrast  Result Date: 04/25/2017 CLINICAL DATA:  Chest pain extending to the left side of the neck and left arm starting around 10 p.m. last night. EXAM: CT ANGIOGRAPHY CHEST, ABDOMEN AND PELVIS TECHNIQUE: Multidetector CT imaging through the chest, abdomen and pelvis was performed using the standard protocol during bolus administration of intravenous contrast. Multiplanar reconstructed images and MIPs were obtained and reviewed to evaluate the vascular anatomy. CONTRAST:  100 mL Isovue 370 COMPARISON:  None. FINDINGS: CTA CHEST FINDINGS Cardiovascular: Noncontrast images of the chest demonstrate postoperative changes in the mediastinum with sternotomy wires and surgical clips and vascular markers present. Calcification of the aorta. No evidence of intramural hematoma. Images obtained during arterial phase after contrast administration demonstrate mild aortic ectasia with ascending  aorta measuring 3.8 cm diameter. No evidence of aortic dissection. Irregularity of the aortic wall in the arch region is consistent with atherosclerotic change. Focal plaque ulceration is suspected. Great vessel origins are patent. Calcification at the origin of the left subclavian artery results and moderate stenosis all the vessel remains patent. Normal opacification of the central and segmental pulmonary arteries. No  evidence of pulmonary embolus. Normal heart size. No pericardial effusion. Mediastinum/Nodes: No significant lymphadenopathy in the chest. Esophagus is decompressed. Lungs/Pleura: Atelectasis or infiltrations demonstrated in both lung bases. Emphysematous changes throughout the lungs. No consolidation. Airways thickening likely representing chronic bronchitis. No pleural effusions. No pneumothorax. Irregular nodule in the right lung apex measuring 6 mm diameter. Musculoskeletal: Degenerative changes throughout the thoracic spine. No vertebral compression deformities. No destructive bone lesions. Review of the MIP images confirms the above findings. CTA ABDOMEN AND PELVIS FINDINGS VASCULAR Aorta: Diffuse calcific and noncalcific atherosclerotic change throughout the abdominal aorta and iliac arteries. Mural thrombus is present. Changes result in moderate stenosis of the distal abdominal aorta. No aneurysm or dissection. Celiac: Origin of the celiac axis is pinpoint suggesting significant stenosis. Reconstitution of flow from collateral vessels. SMA: Superior mesenteric artery is patent with scattered calcification. Renals: Single bilateral renal arteries. Significant stenosis is suggested in the renal artery origins bilaterally but the vessels remain patent. Nephrograms are symmetrical. IMA: Inferior mesenteric artery is patent. Inflow: Calcific and noncalcific atherosclerotic changes in both common iliac artery's resulting in moderate luminal narrowing. 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 Diffuse disease throughout the external iliac and internal iliac arteries bilaterally. High-grade stenosis of the origin and midportion of the right external iliac artery and of the distal left external iliac artery with the vessels remain patent. Occlusion of the right internal and left internal iliac arteries. Stenosis of both common femoral arteries. Veins: No obvious venous abnormality within the limitations of  this arterial phase study. Review of the MIP images confirms the above findings. NON-VASCULAR Hepatobiliary: No focal liver abnormality is seen. No gallstones, gallbladder wall thickening, or biliary dilatation. Pancreas: Unremarkable. No pancreatic ductal dilatation or surrounding inflammatory changes. Spleen: Normal in size without focal abnormality. Adrenals/Urinary Tract: Adrenal glands are unremarkable. Kidneys are normal, without renal calculi, focal lesion, or hydronephrosis. Bladder is unremarkable. Stomach/Bowel: Stomach is within normal limits. Appendix appears normal. No evidence of bowel wall thickening, distention, or inflammatory changes. Lymphatic: No significant lymphadenopathy. Reproductive: Prostate is unremarkable. Other: No free air or free fluid in the abdomen. Abdominal wall musculature appears intact. Musculoskeletal: Degenerative changes in the spine. No destructive bone lesions. Review of the MIP images confirms the above findings. IMPRESSION: 1. No evidence of thoracic or abdominal aortic dissection. No evidence of significant pulmonary embolus. 2. Extensive aortic and branch vessel atherosclerosis with calcific and noncalcific atherosclerotic changes. 3. Ulcerated plaque formation demonstrated in the aortic arch. 4. Moderate stenosis of the distal abdominal aorta. High-grade stenosis of the celiac axis with reconstitution of flow via collaterals. Moderate stenosis of both renal artery origins. Areas of stenosis demonstrated in both common iliac arteries, both external iliac arteries, both common femoral arteries, and occlusion of both internal iliac arteries. 5. Diffuse emphysematous changes in the lungs. Chronic bronchitic changes. Atelectasis or infiltration demonstrated in both lung bases. Aortic Atherosclerosis (ICD10-I70.0) and Emphysema (ICD10-J43.9). Electronically Signed   By: Burman NievesWilliam  Stevens M.D.   On: 04/25/2017 06:44     Subjective: Pt is having some toe pain but otherwise  no other complaints.    Discharge  Exam: Vitals:   04/29/17 0735 04/29/17 1105  BP: (!) 172/78 109/67  Pulse: (!) 58 63  Resp: 17 17  Temp: 97.8 F (36.6 C) 98.1 F (36.7 C)  SpO2: 97% 96%   Vitals:   04/29/17 0242 04/29/17 0500 04/29/17 0735 04/29/17 1105  BP: (!) 173/77 135/75 (!) 172/78 109/67  Pulse: 67 (!) 55 (!) 58 63  Resp: Temp: 98.4 F (36.9 C)  97.8 F (36.6 C) 98.1 F (36.7 C)  TempSrc:   Oral Oral  SpO2: 98% 96% 97% 96%  Weight: 89.8 kg (198 lb)     Height:        General:  Afebrile, answering questions appropriately.  He was oriented X3. Denies SI and hallucinations.  Cardiovascular: S1 and S2, no rubs, no gallops  Respiratory: good air movement, no wheezing, no crackles  Abdomen: soft, no tenderness, no distended   Musculoskeletal: no edema, no cyanosis   Skin: right great toe with 1cm tip ulceration, no redness, no discharge. Also with stage 1 decubitus ulcer.   The results of significant diagnostics from this hospitalization (including imaging, microbiology, ancillary and laboratory) are listed below for reference.    Microbiology: Recent Results (from the past 240 hour(s))  MRSA PCR Screening     Status: None   Collection Time: 04/25/17  5:05 PM  Result Value Ref Range Status   MRSA by PCR NEGATIVE NEGATIVE Final    Comment:        The GeneXpert MRSA Assay (FDA approved for NASAL specimens only), is one component of a comprehensive MRSA colonization surveillance program. It is not intended to diagnose MRSA infection nor to guide or monitor treatment for MRSA infections.      Labs: BNP (last 3 results)  Recent Labs  04/25/17 0433  BNP 404.5*   Basic Metabolic Panel:  Recent Labs Lab 04/25/17 0100 04/25/17 1426 04/26/17 0009 04/28/17 0441 04/29/17 0320  NA 137  --  132* 134* 135  K 4.0  --  3.9 4.8 4.5  CL 106  --  102 106 108  CO2 23  --  GLUCOSE 194*  --  160* 199* 169*  BUN 14  --  13 26* 20   CREATININE 1.04  --  1.06 1.50* 1.31*  CALCIUM 8.7*  --  8.1* 8.2* 8.2*  MG  --  1.4* 1.9  --   --    Liver Function Tests: No results for input(s): AST, ALT, ALKPHOS, BILITOT, PROT, ALBUMIN in the last 168 hours. No results for input(s): LIPASE, AMYLASE in the last 168 hours.  Recent Labs Lab 04/26/17 0234  AMMONIA 25   CBC:  Recent Labs Lab 04/25/17 0100 04/26/17 0009 04/27/17 0152 04/28/17 0441 04/29/17 0320  WBC 8.0 6.9 7.1 6.2 6.2  HGB 11.9* 11.4* 10.7* 10.5* 10.4*  HCT 36.0* 34.8* 33.4* 33.0* 32.1*  MCV 83.7 83.1 84.1 84.6 84.7  PLT 140* 132* 147* 146* 137*   Cardiac Enzymes:  Recent Labs Lab 04/25/17 0100 04/25/17 0433 04/25/17 1426 04/25/17 1812 04/26/17 0009  TROPONINI <0.03 0.29* 0.72* 0.63* 0.60*   BNP: Invalid input(s): POCBNP CBG:  Recent Labs Lab 04/28/17 1140 04/28/17 1652 04/28/17 2131 04/29/17 0735 04/29/17 1107  GLUCAP 242* 206* 265* 130* 241*   D-Dimer No results for input(s): DDIMER in the last 72 hours. Hgb A1c No results for input(s): HGBA1C in the last 72 hours. Lipid Profile No results for input(s): CHOL, HDL, LDLCALC, TRIG, CHOLHDL,  LDLDIRECT in the last 72 hours. Thyroid function studies No results for input(s): TSH, T4TOTAL, T3FREE, THYROIDAB in the last 72 hours.  Invalid input(s): FREET3 Anemia work up No results for input(s): VITAMINB12, FOLATE, FERRITIN, TIBC, IRON, RETICCTPCT in the last 72 hours. Urinalysis    Component Value Date/Time   COLORURINE YELLOW 12/23/2016 1900   APPEARANCEUR CLEAR 12/23/2016 1900   LABSPEC 1.014 12/23/2016 1900   PHURINE 5.0 12/23/2016 1900   GLUCOSEU NEGATIVE 12/23/2016 1900   HGBUR SMALL (A) 12/23/2016 1900   BILIRUBINUR NEGATIVE 12/23/2016 1900   KETONESUR NEGATIVE 12/23/2016 1900   PROTEINUR 30 (A) 12/23/2016 1900   NITRITE NEGATIVE 12/23/2016 1900   LEUKOCYTESUR NEGATIVE 12/23/2016 1900   Sepsis Labs Invalid input(s): PROCALCITONIN,  WBC,   LACTICIDVEN Microbiology Recent Results (from the past 240 hour(s))  MRSA PCR Screening     Status: None   Collection Time: 04/25/17  5:05 PM  Result Value Ref Range Status   MRSA by PCR NEGATIVE NEGATIVE Final    Comment:        The GeneXpert MRSA Assay (FDA approved for NASAL specimens only), is one component of a comprehensive MRSA colonization surveillance program. It is not intended to diagnose MRSA infection nor to guide or monitor treatment for MRSA infections.    Time coordinating discharge: 40 minutes  SIGNED:  Standley Dakins, MD  [facility] 04/29/2017, 3:10 PM Pager [phone]  If 7PM-7AM, please contact night-coverage [email or web address] Password [email or web address]

## 2017-04-29 NOTE — Discharge Instructions (Signed)
Follow with Primary MD  Patient, No Pcp Per  and other consultant's as instructed your Hospitalist MD ° °Please get a complete blood count and chemistry panel checked by your Primary MD at your next visit, and again as instructed by your Primary MD. ° °Get Medicines reviewed and adjusted: °Please take all your medications with you for your next visit with your Primary MD ° °Laboratory/radiological data: °Please request your Primary MD to go over all hospital tests and procedure/radiological results at the follow up, please ask your Primary MD to get all Hospital records sent to his/her office. ° °In some cases, they will be blood work, cultures and biopsy results pending at the time of your discharge. Please request that your primary care M.D. follows up on these results. ° °Also Note the following: °If you experience worsening of your admission symptoms, develop shortness of breath, life threatening emergency, suicidal or homicidal thoughts you must seek medical attention immediately by calling 911 or calling your MD immediately  if symptoms less severe. ° °You must read complete instructions/literature along with all the possible adverse reactions/side effects for all the Medicines you take and that have been prescribed to you. Take any new Medicines after you have completely understood and accpet all the possible adverse reactions/side effects.  ° °Do not drive when taking Pain medications or sleeping medications (Benzodaizepines) ° °Do not take more than prescribed Pain, Sleep and Anxiety Medications. It is not advisable to combine anxiety,sleep and pain medications without talking with your primary care practitioner ° °Special Instructions: If you have smoked or chewed Tobacco  in the last 2 yrs please stop smoking, stop any regular Alcohol  and or any Recreational drug use. ° °Wear Seat belts while driving. ° °Please note: °You were cared for by a hospitalist during your hospital stay. Once you are discharged,  your primary care physician will handle any further medical issues. Please note that NO REFILLS for any discharge medications will be authorized once you are discharged, as it is imperative that you return to your primary care physician (or establish a relationship with a primary care physician if you do not have one) for your post hospital discharge needs so that they can reassess your need for medications and monitor your lab values. ° ° ° ° °

## 2017-05-01 ENCOUNTER — Encounter: Payer: Self-pay | Admitting: Surgery

## 2017-05-04 ENCOUNTER — Encounter: Payer: Non-veteran care | Admitting: Surgery

## 2017-05-05 ENCOUNTER — Encounter (INDEPENDENT_AMBULATORY_CARE_PROVIDER_SITE_OTHER): Payer: Self-pay | Admitting: Orthopedic Surgery

## 2017-05-05 ENCOUNTER — Ambulatory Visit (INDEPENDENT_AMBULATORY_CARE_PROVIDER_SITE_OTHER): Payer: Medicare HMO | Admitting: Orthopedic Surgery

## 2017-05-05 DIAGNOSIS — L97909 Non-pressure chronic ulcer of unspecified part of unspecified lower leg with unspecified severity: Secondary | ICD-10-CM

## 2017-05-05 DIAGNOSIS — I739 Peripheral vascular disease, unspecified: Secondary | ICD-10-CM | POA: Diagnosis not present

## 2017-05-05 NOTE — Progress Notes (Signed)
Office Visit Note   Patient: Jordan Gardner           Date of Birth: 24-May-1951           MRN: [id number] Visit Date: 05/05/2017              Requested by: No referring provider defined for this encounter. PCP: Patient, No Pcp Per  Chief Complaint  Patient presents with  . Right Foot - Wound Check  . Left Foot - Wound Check      HPI: Patient is a 66 year old gentleman is seen for initial evaluation for ischemic ulcers on both feet he has ischemic ulcer on the tip of the right great toe as well as dorsally on the left third toe. Patient states that both feet are cold he states that they have recently been painful. Patient is currently nonweightbearing in a wheelchair.  Assessment & Plan: Visit Diagnoses:  1. 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 Patient has ischemic changes in both lower extremities.  Follow-Up Instructions: Return if symptoms worsen or fail to improve.   Ortho Exam  Patient is alert, oriented, no adenopathy, well-dressed, normal affect, normal respiratory effort. Examination patient is ambulating a wheelchair. He does not have a palpable pulse bilaterally. Doppler was used and patient does not have a dopplerable dorsalis pedis or posterior tibial pulse. Patient's lower extremities are swollen cold to the touch with thin atrophic skin.  Imaging: No results found. No images are attached to the encounter.  Labs: Lab Results  Component Value Date   HGBA1C 7.4 (H) 04/25/2017   REPTSTATUS 12/25/2016 FINAL 12/23/2016   CULT MULTIPLE SPECIES PRESENT, SUGGEST RECOLLECTION (A) 12/23/2016    Orders:  Orders Placed This Encounter  Procedures  . Ambulatory referral to Vascular Surgery   No orders of the defined types were placed in this encounter.    Procedures: No procedures performed  Clinical Data: No additional findings.  ROS:  All other systems  negative, except as noted in the HPI. Review of Systems  Objective: Vital Signs: There were no vitals taken for this visit.  Specialty Comments:  No specialty comments available.  PMFS History: Patient Active Problem List   Diagnosis Date Noted  . 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 AKI (acute kidney injury) (HCC) 04/28/2017  . Malnutrition of moderate degree 04/28/2017  . Pressure injury of skin 04/27/2017  . Protein calorie malnutrition (HCC) 04/27/2017  . Physical deconditioning 04/27/2017  . Ulcer of great toe (HCC) 04/27/2017  . Claudication (HCC) 04/27/2017  . Sacral decubitus ulcer 04/27/2017  . Accelerated hypertension   . Hyperlipidemia   . Tobacco abuse disorder   . Altered mental status   . PAD (peripheral artery disease) (HCC)   . NSTEMI (non-ST elevated myocardial infarction) (HCC) 04/25/2017  . NECK PAIN 10/16/2008  . BACK PAIN 10/16/2008   Past Medical History:  Diagnosis Date  . Coronary artery disease   . Diabetes mellitus without complication (HCC)   . Hyperlipidemia   . Hypertension   . PAD (peripheral artery disease) (HCC)   . Protein calorie malnutrition (HCC) 04/2017  . Psychotic affective disorder (HCC)    Per sister. Unsure of Dx. Pt sleeps late and difficult to wake. odd affect  . Sacral decubitus ulcer 04/2017    Family History  Problem Relation Age of Onset  . Hypertension Sister  Past Surgical History:  Procedure Laterality Date  . BACK SURGERY    . CARDIAC CATHETERIZATION    . CARDIAC SURGERY     CABG x5 2012 - [facility]   Social History   Occupational History  . Not on file.   Social History Main Topics  . Smoking status: Current Every Day Smoker    Packs/day: 2.00    Types: Cigarettes  . Smokeless tobacco: Never Used  . Alcohol use No  . Drug use: No  . Sexual activity: Not on file

## 2017-05-27 ENCOUNTER — Encounter: Payer: Self-pay | Admitting: *Deleted

## 2017-05-27 ENCOUNTER — Encounter: Payer: Self-pay | Admitting: Vascular Surgery

## 2017-05-27 ENCOUNTER — Ambulatory Visit (INDEPENDENT_AMBULATORY_CARE_PROVIDER_SITE_OTHER): Payer: Non-veteran care | Admitting: Vascular Surgery

## 2017-05-27 ENCOUNTER — Other Ambulatory Visit: Payer: Self-pay | Admitting: *Deleted

## 2017-05-27 VITALS — BP 139/89 | HR 76 | Temp 97.5°F | Resp 16 | Ht 73.0 in | Wt 178.2 lb

## 2017-05-27 DIAGNOSIS — I739 Peripheral vascular disease, unspecified: Secondary | ICD-10-CM | POA: Diagnosis not present

## 2017-05-27 NOTE — Progress Notes (Signed)
Patient name: Jordan Gardner MRN: [id number] DOB: 12-12-1950 Sex: male   REASON FOR CONSULT:    Peripheral vascular disease. The consult is requested by Dr. Laural Benes.  HPI:   Jordan Gardner is a pleasant 66 y.o. male,  Who presents for evaluation of peripheral vascular disease. He tells me that his legs have been hurting for about a year. The pain came on gradually. He describes pain in his feet and calves which occurs even at rest. His symptoms are aggravated by walking. He describes rest pain in both feet at night. His symptoms have been gradually progressive over the last several months. He states that the symptoms are worse in his right leg. He is unaware of any wounds on his feet although he does have a wound on his left third toe as described below.  He continues to smoke about a pack per day and has been smoking since he was 66 years old. He had smoked up to 2 packs per day.   I have reviewed the records that were sent with the patient. He was in the hospital from 04/25/2017 2 04/29/2017 and was discharged to a skilled nursing facility. He undergone previous coronary revascularization in [place]. The patient had a non-ST MI that admission. He refused catheterization and was to [facility] up [facility]. He tells me that he does still have some occasional chest pain.  The patient also has a history of stage II chronic kidney disease.  Past Medical History:  Diagnosis Date  . Coronary artery disease   . Diabetes mellitus without complication (HCC)   . Hyperlipidemia   . Hypertension   . PAD (peripheral artery disease) (HCC)   . Protein calorie malnutrition (HCC) 04/2017  . Psychotic affective disorder (HCC)    Per sister. Unsure of Dx. Pt sleeps late and difficult to wake. odd affect  . Sacral decubitus ulcer 04/2017    Family History  Problem Relation Age of Onset  . Hypertension Sister     SOCIAL HISTORY: Social History   Social History    . Marital status: Single    Spouse name: N/A  . Number of children: N/A  . Years of education: N/A   Occupational History  . Not on file.   Social History Main Topics  . Smoking status: Current Every Day Smoker    Packs/day: 1.00    Types: Cigarettes  . Smokeless tobacco: Never Used  . Alcohol use No  . Drug use: No  . Sexual activity: Not on file   Other Topics Concern  . Not on file   Social History Narrative  . No narrative on file    No Known Allergies  Current Outpatient Prescriptions  Medication Sig Dispense Refill  . aspirin EC 81 MG EC tablet Take 1 tablet (81 mg total) by mouth daily.    Marland Kitchen atorvastatin (LIPITOR) 80 MG tablet Take 1 tablet (80 mg total) by mouth daily at 6 PM.    . cilostazol (PLETAL) 100 MG tablet Take 100 mg by mouth 2 (two) times daily.    . feeding supplement, GLUCERNA SHAKE, (GLUCERNA SHAKE) LIQD Take 237 mLs by mouth 2 (two) times daily between meals.  0  . isosorbide mononitrate (IMDUR) 30 MG 24 hr tablet Take 1 tablet (30 mg total) by mouth daily.    . metFORMIN (GLUCOPHAGE) 500 MG tablet Take 1,000 mg by mouth 2 (two) times daily with a meal.    . metoprolol  tartrate (LOPRESSOR) 25 MG tablet Take 12.5 mg by mouth 2 (two) times daily.    . mirtazapine (REMERON) 15 MG tablet Take 15 mg by mouth at bedtime.    . Multiple Vitamin (MULTIVITAMIN WITH MINERALS) TABS tablet Take 1 tablet by mouth daily.    . nicotine (NICODERM CQ - DOSED IN MG/24 HOURS) 14 mg/24hr patch Place 1 patch (14 mg total) onto the skin daily. 28 patch 0  . nitroGLYCERIN (NITROSTAT) 0.4 MG SL tablet Place 1 tablet (0.4 mg total) under the tongue every 5 (five) minutes x 3 doses as needed for chest pain.  12  . sertraline (ZOLOFT) 100 MG tablet Take 100 mg by mouth every morning.     No current facility-administered medications for this visit.     REVIEW OF SYSTEMS:   denotes positive finding,  denotes negative finding Cardiac  Comments:  Chest pain or chest  pressure: X   Shortness of breath upon exertion: X   Short of breath when lying flat:    Irregular heart rhythm: X       Vascular    Pain in calf, thigh, or hip brought on by ambulation: X   Pain in feet at night that wakes you up from your sleep:  XX   Blood clot in your veins:    Leg swelling:         Pulmonary    Oxygen at home:    Productive cough:     Wheezing:         Neurologic    Sudden weakness in arms or legs:  X   Sudden numbness in arms or legs:  X   Sudden onset of difficulty speaking or slurred speech:    Temporary loss of vision in one eye:     Problems with dizziness:  X       Gastrointestinal    Blood in stool:     Vomited blood:         Genitourinary    Burning when urinating:     Blood in urine:        Psychiatric    Major depression:  X       Hematologic    Bleeding problems:    Problems with blood clotting too easily:        Skin    Rashes or ulcers: X       Constitutional    Fever or chills:     PHYSICAL EXAM:   Vitals:   05/27/17 0952  BP: 139/89  Pulse: 76  Resp: 16  Temp: (!) 97.5 F (36.4 C)  TempSrc: Oral  SpO2: 99%  Weight: 178 lb 3.2 oz (80.8 kg)  Height:  (1.854 m)    GENERAL: The patient is a somewhat malnourished, debilitated male, in no acute distress. The vital signs are documented above. CARDIAC: There is a regular rate and rhythm.  VASCULAR: I do not detect carotid bruits. He has palpable femoral pulses bilaterally. I cannot palpate popliteal or pedal pulses on either side. He has no significant lower extremity swelling. It looks like the vein was taken from his right leg for CABG. PULMONARY: There is good air exchange bilaterally without wheezing or rales. ABDOMEN: Soft and non-tender with normal pitched bowel sounds. I do not palpate an abdominal aortic aneurysm. MUSCULOSKELETAL: There are no major deformities or cyanosis. NEUROLOGIC: No focal weakness or paresthesias are detected. SKIN: He has a small  wound on his left third toe. The  toe was slightly discolored. PSYCHIATRIC: The patient has a normal affect.  DATA:    ARTERIAL DOPPLER STUDY: I reviewed the arterial Doppler study that was done on 04/28/2017. This showed monophasic Doppler signals in the dorsalis pedis and posterior tibial positions bilaterally. ABI on the right was 53%. ABI on the left was 47%.  LABS: His creatinine on 04/25/2017 was 1.04.  CT ANGIOGRAM CHEST ABDOMEN AND PELVIS: I reviewed his CT angiogram that was done on 04/25/2017. 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 There was a high-grade stenosis of the celiac axis and a moderate stenosis of both renal arteries. The superior mesenteric artery and inferior mesenteric artery were patent. Overall he has diffuse multilevel arterial occlusive disease.  MEDICAL ISSUES:   CRITICAL LIMB ISCHEMIA BILATERALLY: This patient has critical limb ischemia bilaterally. He has rest pain bilaterally and also a wound on his left third toe. Given his multilevel arterial occlusive disease this is clearly a limb threatening situation. However, he would be at very high risk for surgery given his recent non-ST MI, and protein calorie malnutrition.   I have recommended that we proceed with arteriography to at least see what our options are and hopefully find something that can be done from an endovascular standpoint. I have reviewed with the patient the indications for arteriography. In addition, I have reviewed the potential complications of arteriography including but not limited to: Bleeding, arterial injury, arterial thrombosis, dye action, renal insufficiency, or other unpredictable medical problems. I have explained to the patient that if we find disease amenable to angioplasty we could potentially address this at the same time. I have discussed the potential complications of angioplasty and stenting, including but not limited to: Bleeding, arterial thrombosis, arterial  injury, dissection, or the need for surgical intervention.  If there are no options from an endovascular standpoint and certainly he would need preoperative cardiac clearance before considering any surgery. He is followed by a cardiologist at the [facility] in [place]. He would also need preoperative vein mapping.  He is going to have to talk to his family before he can decide on a date to proceed with arteriography.  Waverly Ferrari [facility] [phone]

## 2017-05-27 NOTE — Progress Notes (Signed)
Error

## 2017-06-08 ENCOUNTER — Telehealth: Payer: Self-pay | Admitting: *Deleted

## 2017-06-08 ENCOUNTER — Ambulatory Visit (HOSPITAL_COMMUNITY)
Admission: RE | Admit: 2017-06-08 | Discharge: 2017-06-08 | Disposition: A | Discharge status: Home / Self Care | Payer: Medicare HMO | Source: Ambulatory Visit | Attending: Vascular Surgery | Admitting: Vascular Surgery

## 2017-06-08 ENCOUNTER — Encounter (HOSPITAL_COMMUNITY)
Admission: RE | Disposition: A | Discharge status: Home / Self Care | Payer: Self-pay | Source: Ambulatory Visit | Attending: Vascular Surgery

## 2017-06-08 ENCOUNTER — Encounter (HOSPITAL_COMMUNITY): Payer: Self-pay | Admitting: Vascular Surgery

## 2017-06-08 DIAGNOSIS — E11621 Type 2 diabetes mellitus with foot ulcer: Secondary | ICD-10-CM | POA: Diagnosis not present

## 2017-06-08 DIAGNOSIS — E46 Unspecified protein-calorie malnutrition: Secondary | ICD-10-CM | POA: Insufficient documentation

## 2017-06-08 DIAGNOSIS — F17210 Nicotine dependence, cigarettes, uncomplicated: Secondary | ICD-10-CM | POA: Insufficient documentation

## 2017-06-08 DIAGNOSIS — I70221 Atherosclerosis of native arteries of extremities with rest pain, right leg: Secondary | ICD-10-CM | POA: Diagnosis not present

## 2017-06-08 DIAGNOSIS — L97529 Non-pressure chronic ulcer of other part of left foot with unspecified severity: Secondary | ICD-10-CM | POA: Diagnosis not present

## 2017-06-08 DIAGNOSIS — I700 Atherosclerosis of aorta: Secondary | ICD-10-CM | POA: Insufficient documentation

## 2017-06-08 DIAGNOSIS — I129 Hypertensive chronic kidney disease with stage 1 through stage 4 chronic kidney disease, or unspecified chronic kidney disease: Secondary | ICD-10-CM | POA: Insufficient documentation

## 2017-06-08 DIAGNOSIS — Z7984 Long term (current) use of oral hypoglycemic drugs: Secondary | ICD-10-CM | POA: Diagnosis not present

## 2017-06-08 DIAGNOSIS — I70245 Atherosclerosis of native arteries of left leg with ulceration of other part of foot: Secondary | ICD-10-CM | POA: Diagnosis not present

## 2017-06-08 DIAGNOSIS — I252 Old myocardial infarction: Secondary | ICD-10-CM | POA: Diagnosis not present

## 2017-06-08 DIAGNOSIS — E1122 Type 2 diabetes mellitus with diabetic chronic kidney disease: Secondary | ICD-10-CM | POA: Diagnosis not present

## 2017-06-08 DIAGNOSIS — E1151 Type 2 diabetes mellitus with diabetic peripheral angiopathy without gangrene: Secondary | ICD-10-CM | POA: Insufficient documentation

## 2017-06-08 DIAGNOSIS — Z7982 Long term (current) use of aspirin: Secondary | ICD-10-CM | POA: Diagnosis not present

## 2017-06-08 DIAGNOSIS — I701 Atherosclerosis of renal artery: Secondary | ICD-10-CM | POA: Diagnosis not present

## 2017-06-08 DIAGNOSIS — F39 Unspecified mood [affective] disorder: Secondary | ICD-10-CM | POA: Insufficient documentation

## 2017-06-08 DIAGNOSIS — E785 Hyperlipidemia, unspecified: Secondary | ICD-10-CM | POA: Insufficient documentation

## 2017-06-08 DIAGNOSIS — Z6823 Body mass index (BMI) 23.0-23.9, adult: Secondary | ICD-10-CM | POA: Insufficient documentation

## 2017-06-08 DIAGNOSIS — N182 Chronic kidney disease, stage 2 (mild): Secondary | ICD-10-CM | POA: Insufficient documentation

## 2017-06-08 DIAGNOSIS — I2510 Atherosclerotic heart disease of native coronary artery without angina pectoris: Secondary | ICD-10-CM | POA: Insufficient documentation

## 2017-06-08 DIAGNOSIS — L89159 Pressure ulcer of sacral region, unspecified stage: Secondary | ICD-10-CM | POA: Diagnosis not present

## 2017-06-08 HISTORY — PX: ABDOMINAL AORTOGRAM W/LOWER EXTREMITY: CATH118223

## 2017-06-08 LAB — POCT I-STAT, CHEM 8
BUN: 16 mg/dL (ref 6–20)
CHLORIDE: 106 mmol/L (ref 101–111)
CREATININE: 1.2 mg/dL (ref 0.61–1.24)
Calcium, Ion: 1.12 mmol/L — ABNORMAL LOW (ref 1.15–1.40)
Glucose, Bld: 141 mg/dL — ABNORMAL HIGH (ref 65–99)
HEMATOCRIT: 35 % — AB (ref 39.0–52.0)
Hemoglobin: 11.9 g/dL — ABNORMAL LOW (ref 13.0–17.0)
POTASSIUM: 4.5 mmol/L (ref 3.5–5.1)
SODIUM: 140 mmol/L (ref 135–145)
TCO2: 23 mmol/L (ref 22–32)

## 2017-06-08 LAB — GLUCOSE, CAPILLARY: GLUCOSE-CAPILLARY: 123 mg/dL — AB (ref 65–99)

## 2017-06-08 SURGERY — ABDOMINAL AORTOGRAM W/LOWER EXTREMITY
Anesthesia: LOCAL

## 2017-06-08 MED ORDER — SODIUM CHLORIDE 0.9 % IV SOLN
INTRAVENOUS | Status: DC
  Administered 2017-06-08: 07:00:00 via INTRAVENOUS

## 2017-06-08 MED ORDER — LABETALOL HCL 5 MG/ML IV SOLN
INTRAVENOUS | Status: AC
  Filled 2017-06-08: qty 4

## 2017-06-08 MED ORDER — LABETALOL HCL 5 MG/ML IV SOLN
10.0000 mg | INTRAVENOUS | Status: DC | PRN
  Administered 2017-06-08: 10 mg via INTRAVENOUS

## 2017-06-08 MED ORDER — SODIUM CHLORIDE 0.9 % IV SOLN: 250.0000 mL | INTRAVENOUS | Status: DC | PRN

## 2017-06-08 MED ORDER — LIDOCAINE HCL 2 % IJ SOLN
INTRAMUSCULAR | Status: DC | PRN
  Administered 2017-06-08: 20 mL

## 2017-06-08 MED ORDER — IOPAMIDOL (ISOVUE-370) INJECTION 76%
INTRAVENOUS | Status: DC | PRN
  Administered 2017-06-08: 147 mL via INTRA_ARTERIAL

## 2017-06-08 MED ORDER — LIDOCAINE HCL 2 % IJ SOLN
INTRAMUSCULAR | Status: AC
  Filled 2017-06-08: qty 10

## 2017-06-08 MED ORDER — SODIUM CHLORIDE 0.9% FLUSH: 3.0000 mL | Freq: Two times a day (BID) | INTRAVENOUS | Status: DC

## 2017-06-08 MED ORDER — HYDRALAZINE HCL 20 MG/ML IJ SOLN: 5.0000 mg | INTRAMUSCULAR | Status: DC | PRN

## 2017-06-08 MED ORDER — HYDRALAZINE HCL 20 MG/ML IJ SOLN
INTRAMUSCULAR | Status: AC
  Filled 2017-06-08: qty 1

## 2017-06-08 MED ORDER — SODIUM CHLORIDE 0.9% FLUSH: 3.0000 mL | INTRAVENOUS | Status: DC | PRN

## 2017-06-08 MED ORDER — HEPARIN (PORCINE) IN NACL 2-0.9 UNIT/ML-% IJ SOLN
INTRAMUSCULAR | Status: AC
  Filled 2017-06-08: qty 1000

## 2017-06-08 MED ORDER — HEPARIN (PORCINE) IN NACL 2-0.9 UNIT/ML-% IJ SOLN
INTRAMUSCULAR | Status: AC | PRN
  Administered 2017-06-08: 1000 mL

## 2017-06-08 MED ORDER — SODIUM CHLORIDE 0.9 % WEIGHT BASED INFUSION: 1.0000 mL/kg/h | INTRAVENOUS | Status: DC

## 2017-06-08 SURGICAL SUPPLY — 11 items
CATH ANGIO 5F PIGTAIL 65CM (CATHETERS) ×1 IMPLANT
COVER PRB 48X5XTLSCP FOLD TPE (BAG) IMPLANT
COVER PROBE 5X48 (BAG) ×2
KIT MICROINTRODUCER STIFF 5F (SHEATH) ×1 IMPLANT
KIT PV (KITS) ×2 IMPLANT
SHEATH PINNACLE 5F 10CM (SHEATH) ×1 IMPLANT
SYR MEDRAD MARK V 150ML (SYRINGE) ×2 IMPLANT
TRANSDUCER W/STOPCOCK (MISCELLANEOUS) ×2 IMPLANT
TRAY PV CATH (CUSTOM PROCEDURE TRAY) ×2 IMPLANT
WIRE HITORQ VERSACORE ST 145CM (WIRE) ×1 IMPLANT
WIRE TORQFLEX AUST .018X40CM (WIRE) ×1 IMPLANT

## 2017-06-08 NOTE — Progress Notes (Signed)
Patients 5 fr right femoral arterial sheath was pulled per order. Manual pressure was held for 20 minutes. Patient tolerated pull well. Sheath site during and post sheath pull was clean, dry, intact and had no sign of a hematoma. Patients VS remained WNL's during and post sheath pull. Patients bedrest began at 0925 and ends in 4 fours at 1325. Vascular site assessment was a level 0. Sheath removal site was dressed with a gauze dressing and was clean, dry, and intact. Patient was educated about post sheath pull management and stated he understood and had no questions.

## 2017-06-08 NOTE — Op Note (Signed)
PATIENT: Jordan Gardner      MRN: [id number] DOB: 1950/12/22    DATE OF PROCEDURE: 06/08/2017  INDICATIONS:    Nashid Jou is a 66 y.o. male who presented with progressive ischemia of both lower extremities. Of note the patient had a non-ST MI in September and refused catheterization. He was to follow up with his cardiologist at the [facility] in [place].  His medical history is further complicated by protein calorie malnutrition diabetes and a sacral decubitus ulcer. He currently resides in a skilled nursing facility. He had a small wound on his left third toe.  PROCEDURE:    1. Ultrasound-guided access to the right common femoral artery 2. Aortogram with bilateral iliac arteriogram and bilateral lower extremity runoff  SURGEON: Di Kindle. Edilia Bo, MD, FACS  ANESTHESIA: Local   EBL: Minimal  TECHNIQUE: The patient was taken to the peripheral vascular lab and was not sedated. His groins were prepped and draped in usual sterile fashion. Under ultrasound guidance, after the skin was anesthetized, the right common femoral artery was cannulated with a micropuncture needle and a micropuncture sheath introduced over a wire. This was exchanged for a 5 [place] sheath over a [employer] wire. A pigtail catheter was in position at the L1 vertebral body and flush aortogram obtained. A lateral projection of the aorta was also obtained. The catheter was in position below the renal arteries and oblique iliac projection was obtained and then bilateral lower extremity runoff films were obtained. The catheter was removed over the wire. The wire was removed. The patient was transferred to the holding area for removal of the sheath. No immediate complications were noted.  FINDINGS:   1. 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 2. 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 3. There is diffuse aortoiliac occlusive disease and also  bilateral hypogastric artery occlusions. 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 4. On the left side, there is diffuse disease of the common iliac artery and external iliac artery. The common femoral artery is patent. There is a tight stenosis of the proximal deep femoral artery on the left. The superficial femoral arteries occluded at its origin with reconstitution of the popliteal artery at the level of the knee. There is two-vessel runoff on the left via the anterior tibial and posterior tibial arteries which have mild diffuse disease. 5. On the right side, there is diffuse disease of the common iliac artery and external iliac artery. There is also significant plaque in the common femoral artery. The deep femoral artery is patent. The superficial femoral arteries occluded at its origin. There is reconstitution of the popliteal artery at the level of the knee. There is single-vessel runoff on the right via the posterior tibial artery.  CLINICAL NOTE: This patient has multilevel arterial occlusive disease with rest pain and wound on his left foot. This is clearly a limb threatening situation. The situation is complicated by the fact that he has a sacral decubitus, has routine calorie malnutrition, and just had a non-ST MI. His options for revascularization would be an aortobifemoral bypass and left femoropopliteal bypass versus axillobifemoral bypass and left femoropopliteal bypass. He is at high-risk for surgery given his medical comorbidities. He will need preoperative cardiac evaluation. The third alternative would be primary amputation if he was felt to be too high risk for surgery.  Waverly Ferrari, MD, FACS [facility]  DATE OF  DICTATION:   06/08/2017

## 2017-06-08 NOTE — Telephone Encounter (Signed)
Staff message sent by Dr. Edilia Boickson:

## 2017-06-08 NOTE — Telephone Encounter (Signed)
-----   Message from Chuck Hinthristopher S Dickson, MD sent at 06/08/2017  8:37 AM EDT ----- Regarding: charge and f/u  PROCEDURE:   1. Ultrasound-guided access to the right common femoral artery 2. Aortogram with bilateral iliac arteriogram and bilateral lower extremity runoff  SURGEON: Di Kindlehristopher S. Edilia Boickson, MD, FACS    CLINICAL NOTE: This patient has multilevel arterial occlusive disease with rest pain and wound on his left foot. This is clearly a limb threatening situation. The situation is complicated by the fact that he has a sacral decubitus, has routine calorie malnutrition, and just had a non-ST MI. His options for revascularization would be an aortobifemoral bypass and left femoropopliteal bypass versus axillobifemoral bypass and left femoropopliteal bypass. He is at high-risk for surgery given his medical comorbidities. He will need preoperative cardiac evaluation. His cardiologist is at the [facility] in [place]. He just recently had a non-ST MI in September. The third alternative would be primary amputation if he was felt to be too high risk for surgery.  [name], MD, FACS [facility]  DATE OF DICTATION:   06/08/2017

## 2017-06-08 NOTE — Interval H&P Note (Signed)
History and Physical Interval Note:  06/08/2017 7:25 AM  Jordan Gardner  has presented today for surgery, with the diagnosis of pvd with ulcer  The various methods of treatment have been discussed with the patient and family. After consideration of risks, benefits and other options for treatment, the patient has consented to  Procedure(s): ABDOMINAL AORTOGRAM W/LOWER EXTREMITY (N/A) as a surgical intervention .  The patient's history has been reviewed, patient examined, no change in status, stable for surgery.  I have reviewed the patient's chart and labs.  Questions were answered to the patient's satisfaction.     Waverly Ferrariickson, Miya Luviano

## 2017-06-08 NOTE — Discharge Instructions (Signed)
NO METFORMIN/GLUCOPHAGE FOR 2 DAYS ° ° °Femoral Site Care °Refer to this sheet in the next few weeks. These instructions provide you with information about caring for yourself after your procedure. Your health care provider may also give you more specific instructions. Your treatment has been planned according to current medical practices, but problems sometimes occur. Call your health care provider if you have any problems or questions after your procedure. °What can I expect after the procedure? °After your procedure, it is typical to have the following: °· Bruising at the site that usually fades within 1-2 weeks. °· Blood collecting in the tissue (hematoma) that may be painful to the touch. It should usually decrease in size and tenderness within 1-2 weeks. ° °Follow these instructions at home: °· Take medicines only as directed by your health care provider. °· You may shower 24-48 hours after the procedure or as directed by your health care provider. Remove the bandage (dressing) and gently wash the site with plain soap and water. Pat the area dry with a clean towel. Do not rub the site, because this may cause bleeding. °· Do not take baths, swim, or use a hot tub until your health care provider approves. °· Check your insertion site every day for redness, swelling, or drainage. °· Do not apply powder or lotion to the site. °· Limit use of stairs to twice a day for the first 2-3 days or as directed by your health care provider. °· Do not squat for the first 2-3 days or as directed by your health care provider. °· Do not lift over 10 lb (4.5 kg) for 5 days after your procedure or as directed by your health care provider. °· Ask your health care provider when it is okay to: °? Return to work or school. °? Resume usual physical activities or sports. °? Resume sexual activity. °· Do not drive home if you are discharged the same day as the procedure. Have someone else drive you. °· You may drive 24 hours after the  procedure unless otherwise instructed by your health care provider. °· Do not operate machinery or power tools for 24 hours after the procedure or as directed by your health care provider. °· If your procedure was done as an outpatient procedure, which means that you went home the same day as your procedure, a responsible adult should be with you for the first 24 hours after you arrive home. °· Keep all follow-up visits as directed by your health care provider. This is important. °Contact a health care provider if: °· You have a fever. °· You have chills. °· You have increased bleeding from the site. Hold pressure on the site. °Get help right away if: °· You have unusual pain at the site. °· You have redness, warmth, or swelling at the site. °· You have drainage (other than a small amount of blood on the dressing) from the site. °· The site is bleeding, and the bleeding does not stop after 30 minutes of holding steady pressure on the site. °· Your leg or foot becomes pale, cool, tingly, or numb. °This information is not intended to replace advice given to you by your health care provider. Make sure you discuss any questions you have with your health care provider. °Document Released: 04/07/2014 Document Revised: 01/10/2016 Document Reviewed: 02/21/2014 °Elsevier Interactive Patient Education © 2018 [employer] ° °

## 2017-06-08 NOTE — H&P (View-Only) (Signed)
  Patient name: Jordan Gardner MRN: [id number] DOB: 06/27/1951 Sex: male   REASON FOR CONSULT:    Peripheral vascular disease. The consult is requested by Dr. Johnson.  HPI:   Jordan Gardner is a pleasant 66 y.o. male,  Who presents for evaluation of peripheral vascular disease. He tells me that his legs have been hurting for about a year. The pain came on gradually. He describes pain in his feet and calves which occurs even at rest. His symptoms are aggravated by walking. He describes rest pain in both feet at night. His symptoms have been gradually progressive over the last several months. He states that the symptoms are worse in his right leg. He is unaware of any wounds on his feet although he does have a wound on his left third toe as described below.  He continues to smoke about a pack per day and has been smoking since he was 66 years old. He had smoked up to 2 packs per day.   I have reviewed the records that were sent with the patient. He was in the hospital from 04/25/2017 2 04/29/2017 and was discharged to a skilled nursing facility. He undergone previous coronary revascularization in [place]. The patient had a non-ST MI that admission. He refused catheterization and [facility] He tells me that he does still have some occasional chest pain.  The patient also has a history of stage II chronic kidney disease.  Past Medical History:  Diagnosis Date  . Coronary artery disease   . Diabetes mellitus without complication (HCC)   . Hyperlipidemia   . Hypertension   . PAD (peripheral artery disease) (HCC)   . Protein calorie malnutrition (HCC) 04/2017  . Psychotic affective disorder (HCC)    Per sister. Unsure of Dx. Pt sleeps late and difficult to wake. odd affect  . Sacral decubitus ulcer 04/2017    Family History  Problem Relation Age of Onset  . Hypertension Sister     SOCIAL HISTORY: Social History   Social History    . Marital status: Single    Spouse name: N/A  . Number of children: N/A  . Years of education: N/A   Occupational History  . Not on file.   Social History Main Topics  . Smoking status: Current Every Day Smoker    Packs/day: 1.00    Types: Cigarettes  . Smokeless tobacco: Never Used  . Alcohol use No  . Drug use: No  . Sexual activity: Not on file   Other Topics Concern  . Not on file   Social History Narrative  . No narrative on file    No Known Allergies  Current Outpatient Prescriptions  Medication Sig Dispense Refill  . aspirin EC 81 MG EC tablet Take 1 tablet (81 mg total) by mouth daily.    . atorvastatin (LIPITOR) 80 MG tablet Take 1 tablet (80 mg total) by mouth daily at 6 PM.    . cilostazol (PLETAL) 100 MG tablet Take 100 mg by mouth 2 (two) times daily.    . feeding supplement, GLUCERNA SHAKE, (GLUCERNA SHAKE) LIQD Take 237 mLs by mouth 2 (two) times daily between meals.  0  . isosorbide mononitrate (IMDUR) 30 MG 24 hr tablet Take 1 tablet (30 mg total) by mouth daily.    . metFORMIN (GLUCOPHAGE) 500 MG tablet Take 1,000 mg by mouth 2 (two) times daily with a meal.    . metoprolol   tartrate (LOPRESSOR) 25 MG tablet Take 12.5 mg by mouth 2 (two) times daily.    . mirtazapine (REMERON) 15 MG tablet Take 15 mg by mouth at bedtime.    . Multiple Vitamin (MULTIVITAMIN WITH MINERALS) TABS tablet Take 1 tablet by mouth daily.    . nicotine (NICODERM CQ - DOSED IN MG/24 HOURS) 14 mg/24hr patch Place 1 patch (14 mg total) onto the skin daily. 28 patch 0  . nitroGLYCERIN (NITROSTAT) 0.4 MG SL tablet Place 1 tablet (0.4 mg total) under the tongue every 5 (five) minutes x 3 doses as needed for chest pain.  12  . sertraline (ZOLOFT) 100 MG tablet Take 100 mg by mouth every morning.     No current facility-administered medications for this visit.     REVIEW OF SYSTEMS:  [X] denotes positive finding, [ ] denotes negative finding Cardiac  Comments:  Chest pain or chest  pressure: X   Shortness of breath upon exertion: X   Short of breath when lying flat:    Irregular heart rhythm: X       Vascular    Pain in calf, thigh, or hip brought on by ambulation: X   Pain in feet at night that wakes you up from your sleep:  XX   Blood clot in your veins:    Leg swelling:         Pulmonary    Oxygen at home:    Productive cough:     Wheezing:         Neurologic    Sudden weakness in arms or legs:  X   Sudden numbness in arms or legs:  X   Sudden onset of difficulty speaking or slurred speech:    Temporary loss of vision in one eye:     Problems with dizziness:  X       Gastrointestinal    Blood in stool:     Vomited blood:         Genitourinary    Burning when urinating:     Blood in urine:        Psychiatric    Major depression:  X       Hematologic    Bleeding problems:    Problems with blood clotting too easily:        Skin    Rashes or ulcers: X       Constitutional    Fever or chills:     PHYSICAL EXAM:   Vitals:   05/27/17 0952  BP: 139/89  Pulse: 76  Resp: 16  Temp: (!) 97.5 F (36.4 C)  TempSrc: Oral  SpO2: 99%  Weight: 178 lb 3.2 oz (80.8 kg)  Height: 6' 1" (1.854 m)    GENERAL: The patient is a somewhat malnourished, debilitated male, in no acute distress. The vital signs are documented above. CARDIAC: There is a regular rate and rhythm.  VASCULAR: I do not detect carotid bruits. He has palpable femoral pulses bilaterally. I cannot palpate popliteal or pedal pulses on either side. He has no significant lower extremity swelling. It looks like the vein was taken from his right leg for CABG. PULMONARY: There is good air exchange bilaterally without wheezing or rales. ABDOMEN: Soft and non-tender with normal pitched bowel sounds. I do not palpate an abdominal aortic aneurysm. MUSCULOSKELETAL: There are no major deformities or cyanosis. NEUROLOGIC: No focal weakness or paresthesias are detected. SKIN: He has a small  wound on his left third toe. The   toe was slightly discolored. PSYCHIATRIC: The patient has a normal affect.  DATA:    ARTERIAL DOPPLER STUDY: I reviewed the arterial Doppler study that was done on 04/28/2017. This showed monophasic Doppler signals in the dorsalis pedis and posterior tibial positions bilaterally. ABI on the right was 53%. ABI on the left was 47%.  LABS: His creatinine on 04/25/2017 was 1.04.  CT ANGIOGRAM CHEST ABDOMEN AND PELVIS: I reviewed his CT angiogram that was done on 04/25/2017. 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 There was a high-grade stenosis of the celiac axis and a moderate stenosis of both renal arteries. The superior mesenteric artery and inferior mesenteric artery were patent. Overall he has diffuse multilevel arterial occlusive disease.  MEDICAL ISSUES:   CRITICAL LIMB ISCHEMIA BILATERALLY: This patient has critical limb ischemia bilaterally. He has rest pain bilaterally and also a wound on his left third toe. Given his multilevel arterial occlusive disease this is clearly a limb threatening situation. However, he would be at very high risk for surgery given his recent non-ST MI, and protein calorie malnutrition.   I have recommended that we proceed with arteriography to at least see what our options are and hopefully find something that can be done from an endovascular standpoint. I have reviewed with the patient the indications for arteriography. In addition, I have reviewed the potential complications of arteriography including but not limited to: Bleeding, arterial injury, arterial thrombosis, dye action, renal insufficiency, or other unpredictable medical problems. I have explained to the patient that if we find disease amenable to angioplasty we could potentially address this at the same time. I have discussed the potential complications of angioplasty and stenting, including but not limited to: Bleeding, arterial thrombosis, arterial  injury, dissection, or the need for surgical intervention.  If there are no options from an endovascular standpoint and certainly he would need preoperative cardiac clearance before considering any surgery. [facility] [place] He would also need preoperative vein mapping.  He is going to have to talk to his family before he can decide on a date to proceed with arteriography.  Dyrell Tuccillo [facility] [phone] 

## 2017-06-26 ENCOUNTER — Other Ambulatory Visit: Payer: Self-pay

## 2017-06-26 ENCOUNTER — Encounter (HOSPITAL_COMMUNITY): Payer: Self-pay | Admitting: *Deleted

## 2017-06-26 ENCOUNTER — Encounter (HOSPITAL_COMMUNITY)
Admission: EM | Disposition: A | Discharge status: Home / Self Care | Payer: Self-pay | Source: Home / Self Care | Attending: Cardiovascular Disease

## 2017-06-26 ENCOUNTER — Inpatient Hospital Stay (HOSPITAL_COMMUNITY)
Admission: EM | Admit: 2017-06-26 | Discharge: 2017-07-01 | DRG: 281 | Disposition: A | Discharge status: Home / Self Care | Payer: Medicare HMO | Attending: Cardiovascular Disease | Admitting: Cardiovascular Disease

## 2017-06-26 ENCOUNTER — Emergency Department (HOSPITAL_COMMUNITY): Payer: Medicare HMO

## 2017-06-26 DIAGNOSIS — I10 Essential (primary) hypertension: Secondary | ICD-10-CM | POA: Diagnosis present

## 2017-06-26 DIAGNOSIS — I25118 Atherosclerotic heart disease of native coronary artery with other forms of angina pectoris: Secondary | ICD-10-CM | POA: Diagnosis not present

## 2017-06-26 DIAGNOSIS — E11621 Type 2 diabetes mellitus with foot ulcer: Secondary | ICD-10-CM | POA: Diagnosis present

## 2017-06-26 DIAGNOSIS — I739 Peripheral vascular disease, unspecified: Secondary | ICD-10-CM

## 2017-06-26 DIAGNOSIS — Z79899 Other long term (current) drug therapy: Secondary | ICD-10-CM | POA: Diagnosis not present

## 2017-06-26 DIAGNOSIS — E46 Unspecified protein-calorie malnutrition: Secondary | ICD-10-CM | POA: Diagnosis present

## 2017-06-26 DIAGNOSIS — I25700 Atherosclerosis of coronary artery bypass graft(s), unspecified, with unstable angina pectoris: Secondary | ICD-10-CM | POA: Diagnosis not present

## 2017-06-26 DIAGNOSIS — F17210 Nicotine dependence, cigarettes, uncomplicated: Secondary | ICD-10-CM | POA: Diagnosis present

## 2017-06-26 DIAGNOSIS — Z720 Tobacco use: Secondary | ICD-10-CM

## 2017-06-26 DIAGNOSIS — I25810 Atherosclerosis of coronary artery bypass graft(s) without angina pectoris: Secondary | ICD-10-CM | POA: Diagnosis not present

## 2017-06-26 DIAGNOSIS — R079 Chest pain, unspecified: Secondary | ICD-10-CM | POA: Diagnosis not present

## 2017-06-26 DIAGNOSIS — E785 Hyperlipidemia, unspecified: Secondary | ICD-10-CM | POA: Diagnosis present

## 2017-06-26 DIAGNOSIS — I70223 Atherosclerosis of native arteries of extremities with rest pain, bilateral legs: Secondary | ICD-10-CM | POA: Diagnosis present

## 2017-06-26 DIAGNOSIS — I70229 Atherosclerosis of native arteries of extremities with rest pain, unspecified extremity: Secondary | ICD-10-CM

## 2017-06-26 DIAGNOSIS — I252 Old myocardial infarction: Secondary | ICD-10-CM

## 2017-06-26 DIAGNOSIS — I4510 Unspecified right bundle-branch block: Secondary | ICD-10-CM | POA: Diagnosis present

## 2017-06-26 DIAGNOSIS — L97509 Non-pressure chronic ulcer of other part of unspecified foot with unspecified severity: Secondary | ICD-10-CM | POA: Diagnosis present

## 2017-06-26 DIAGNOSIS — I214 Non-ST elevation (NSTEMI) myocardial infarction: Principal | ICD-10-CM | POA: Diagnosis present

## 2017-06-26 DIAGNOSIS — I998 Other disorder of circulatory system: Secondary | ICD-10-CM | POA: Diagnosis not present

## 2017-06-26 DIAGNOSIS — I25110 Atherosclerotic heart disease of native coronary artery with unstable angina pectoris: Secondary | ICD-10-CM | POA: Diagnosis present

## 2017-06-26 DIAGNOSIS — Z7984 Long term (current) use of oral hypoglycemic drugs: Secondary | ICD-10-CM

## 2017-06-26 DIAGNOSIS — Z23 Encounter for immunization: Secondary | ICD-10-CM | POA: Diagnosis present

## 2017-06-26 DIAGNOSIS — Z6823 Body mass index (BMI) 23.0-23.9, adult: Secondary | ICD-10-CM | POA: Diagnosis not present

## 2017-06-26 DIAGNOSIS — I2582 Chronic total occlusion of coronary artery: Secondary | ICD-10-CM | POA: Diagnosis present

## 2017-06-26 DIAGNOSIS — Z951 Presence of aortocoronary bypass graft: Secondary | ICD-10-CM

## 2017-06-26 DIAGNOSIS — M25512 Pain in left shoulder: Secondary | ICD-10-CM | POA: Diagnosis present

## 2017-06-26 DIAGNOSIS — I701 Atherosclerosis of renal artery: Secondary | ICD-10-CM | POA: Diagnosis present

## 2017-06-26 DIAGNOSIS — E1151 Type 2 diabetes mellitus with diabetic peripheral angiopathy without gangrene: Secondary | ICD-10-CM | POA: Diagnosis present

## 2017-06-26 HISTORY — PX: LEFT HEART CATH AND CORS/GRAFTS ANGIOGRAPHY: CATH118250

## 2017-06-26 LAB — CBC
HCT: 36.9 % — ABNORMAL LOW (ref 39.0–52.0)
HEMOGLOBIN: 12.1 g/dL — AB (ref 13.0–17.0)
MCH: 27.4 pg (ref 26.0–34.0)
MCHC: 32.8 g/dL (ref 30.0–36.0)
MCV: 83.5 fL (ref 78.0–100.0)
PLATELETS: 176 10*3/uL (ref 150–400)
RBC: 4.42 MIL/uL (ref 4.22–5.81)
RDW: 13.5 % (ref 11.5–15.5)
WBC: 7.7 10*3/uL (ref 4.0–10.5)

## 2017-06-26 LAB — HEPATIC FUNCTION PANEL
ALBUMIN: 3.4 g/dL — AB (ref 3.5–5.0)
ALK PHOS: 84 U/L (ref 38–126)
ALT: 14 U/L — ABNORMAL LOW (ref 17–63)
AST: 66 U/L — AB (ref 15–41)
BILIRUBIN TOTAL: 0.7 mg/dL (ref 0.3–1.2)
Bilirubin, Direct: <0.1 mg/dL — ABNORMAL LOW (ref 0.1–0.5)
Total Protein: 6.3 g/dL — ABNORMAL LOW (ref 6.5–8.1)

## 2017-06-26 LAB — BASIC METABOLIC PANEL
Anion gap: 7 (ref 5–15)
BUN: 11 mg/dL (ref 6–20)
CALCIUM: 8.9 mg/dL (ref 8.9–10.3)
CO2: 22 mmol/L (ref 22–32)
CREATININE: 1.05 mg/dL (ref 0.61–1.24)
Chloride: 107 mmol/L (ref 101–111)
GFR calc non Af Amer: >60 mL/min (ref >60)
Glucose, Bld: 133 mg/dL — ABNORMAL HIGH (ref 65–99)
Potassium: 3.8 mmol/L (ref 3.5–5.1)
SODIUM: 136 mmol/L (ref 135–145)

## 2017-06-26 LAB — I-STAT TROPONIN, ED: TROPONIN I, POC: 3.44 ng/mL — AB (ref 0.00–0.08)

## 2017-06-26 LAB — GLUCOSE, CAPILLARY
Glucose-Capillary: 116 mg/dL — ABNORMAL HIGH (ref 65–99)
Glucose-Capillary: 178 mg/dL — ABNORMAL HIGH (ref 65–99)
Glucose-Capillary: 213 mg/dL — ABNORMAL HIGH (ref 65–99)

## 2017-06-26 LAB — PROTIME-INR
INR: 1.15
INR: 1.14
PROTHROMBIN TIME: 14.5 s (ref 11.4–15.2)
Prothrombin Time: 14.6 seconds (ref 11.4–15.2)

## 2017-06-26 LAB — MAGNESIUM: Magnesium: 1.1 mg/dL — ABNORMAL LOW (ref 1.7–2.4)

## 2017-06-26 LAB — HEMOGLOBIN A1C
HEMOGLOBIN A1C: 7.7 % — AB (ref 4.8–5.6)
Mean Plasma Glucose: 174.29 mg/dL

## 2017-06-26 LAB — TROPONIN I: TROPONIN I: 7.07 ng/mL — AB (ref <0.03)

## 2017-06-26 LAB — POCT ACTIVATED CLOTTING TIME: Activated Clotting Time: 147 seconds

## 2017-06-26 LAB — T4, FREE: Free T4: 1.4 ng/dL — ABNORMAL HIGH (ref 0.61–1.12)

## 2017-06-26 LAB — TSH: TSH: 3.84 u[IU]/mL (ref 0.350–4.500)

## 2017-06-26 SURGERY — LEFT HEART CATH AND CORS/GRAFTS ANGIOGRAPHY
Anesthesia: LOCAL

## 2017-06-26 MED ORDER — LABETALOL HCL 5 MG/ML IV SOLN
INTRAVENOUS | Status: AC
  Filled 2017-06-26: qty 4

## 2017-06-26 MED ORDER — HEPARIN BOLUS VIA INFUSION
4000.0000 [IU] | Freq: Once | INTRAVENOUS | Status: AC
  Administered 2017-06-26: 4000 [IU] via INTRAVENOUS
  Filled 2017-06-26: qty 4000

## 2017-06-26 MED ORDER — ASPIRIN EC 81 MG PO TBEC
81.0000 mg | DELAYED_RELEASE_TABLET | Freq: Every day | ORAL | Status: DC
  Administered 2017-06-27 – 2017-07-01 (×5): 81 mg via ORAL
  Filled 2017-06-26 (×5): qty 1

## 2017-06-26 MED ORDER — MIDAZOLAM HCL 2 MG/2ML IJ SOLN
INTRAMUSCULAR | Status: AC
  Filled 2017-06-26: qty 2

## 2017-06-26 MED ORDER — NITROGLYCERIN 0.4 MG SL SUBL
0.4000 mg | SUBLINGUAL_TABLET | SUBLINGUAL | Status: DC | PRN
Start: 2017-06-26 — End: 2017-07-01
  Administered 2017-06-26: 0.4 mg via SUBLINGUAL

## 2017-06-26 MED ORDER — HYDRALAZINE HCL 20 MG/ML IJ SOLN
INTRAMUSCULAR | Status: AC
  Filled 2017-06-26: qty 1

## 2017-06-26 MED ORDER — NICOTINE 14 MG/24HR TD PT24
14.0000 mg | MEDICATED_PATCH | Freq: Every day | TRANSDERMAL | Status: DC
  Administered 2017-06-27 – 2017-07-01 (×5): 14 mg via TRANSDERMAL
  Filled 2017-06-26 (×5): qty 1

## 2017-06-26 MED ORDER — IOPAMIDOL (ISOVUE-370) INJECTION 76%
INTRAVENOUS | Status: AC
  Filled 2017-06-26: qty 125

## 2017-06-26 MED ORDER — NITROGLYCERIN 0.4 MG SL SUBL
SUBLINGUAL_TABLET | SUBLINGUAL | Status: AC
  Administered 2017-06-26: 0.4 mg
  Filled 2017-06-26: qty 1

## 2017-06-26 MED ORDER — MORPHINE SULFATE (PF) 2 MG/ML IV SOLN
2.0000 mg | INTRAVENOUS | Status: DC | PRN
  Administered 2017-06-26: 2 mg via INTRAVENOUS
  Filled 2017-06-26: qty 1

## 2017-06-26 MED ORDER — FENTANYL CITRATE (PF) 100 MCG/2ML IJ SOLN
INTRAMUSCULAR | Status: DC | PRN
Start: 2017-06-26 — End: 2017-06-26
  Administered 2017-06-26: 25 ug via INTRAVENOUS

## 2017-06-26 MED ORDER — INSULIN ASPART 100 UNIT/ML ~~LOC~~ SOLN
0.0000 [IU] | Freq: Every day | SUBCUTANEOUS | Status: DC
  Administered 2017-06-26 – 2017-06-28 (×2): 2 [IU] via SUBCUTANEOUS

## 2017-06-26 MED ORDER — MIDAZOLAM HCL 2 MG/2ML IJ SOLN
INTRAMUSCULAR | Status: DC | PRN
  Administered 2017-06-26: 1 mg via INTRAVENOUS

## 2017-06-26 MED ORDER — SODIUM CHLORIDE 0.9 % IV SOLN: 250.0000 mL | INTRAVENOUS | Status: DC | PRN

## 2017-06-26 MED ORDER — ONDANSETRON HCL 4 MG/2ML IJ SOLN: 4.0000 mg | Freq: Four times a day (QID) | INTRAMUSCULAR | Status: DC | PRN

## 2017-06-26 MED ORDER — HYDRALAZINE HCL 20 MG/ML IJ SOLN
20.0000 mg | INTRAMUSCULAR | Status: DC | PRN
  Administered 2017-06-26 (×2): 20 mg via INTRAVENOUS

## 2017-06-26 MED ORDER — IOPAMIDOL (ISOVUE-370) INJECTION 76%
INTRAVENOUS | Status: DC | PRN
  Administered 2017-06-26: 105 mL via INTRAVENOUS

## 2017-06-26 MED ORDER — ACETAMINOPHEN 325 MG PO TABS
650.0000 mg | ORAL_TABLET | ORAL | Status: DC | PRN
  Administered 2017-06-26 – 2017-07-01 (×8): 650 mg via ORAL
  Filled 2017-06-26 (×7): qty 2

## 2017-06-26 MED ORDER — HEPARIN (PORCINE) IN NACL 100-0.45 UNIT/ML-% IJ SOLN
1150.0000 [IU]/h | INTRAMUSCULAR | Status: DC
  Administered 2017-06-26: 1150 [IU]/h via INTRAVENOUS
  Filled 2017-06-26: qty 250

## 2017-06-26 MED ORDER — NITROGLYCERIN IN D5W 200-5 MCG/ML-% IV SOLN
5.0000 ug/min | Freq: Once | INTRAVENOUS | Status: AC
  Administered 2017-06-26: 5 ug/min via INTRAVENOUS
  Filled 2017-06-26: qty 250

## 2017-06-26 MED ORDER — ASPIRIN 81 MG PO CHEW
324.0000 mg | CHEWABLE_TABLET | ORAL | Status: AC
  Administered 2017-06-26: 324 mg via ORAL
  Filled 2017-06-26: qty 4

## 2017-06-26 MED ORDER — ISOSORBIDE MONONITRATE ER 60 MG PO TB24
60.0000 mg | ORAL_TABLET | Freq: Every day | ORAL | Status: DC
  Administered 2017-06-26 – 2017-07-01 (×6): 60 mg via ORAL
  Filled 2017-06-26 (×6): qty 1

## 2017-06-26 MED ORDER — CILOSTAZOL 100 MG PO TABS
100.0000 mg | ORAL_TABLET | Freq: Two times a day (BID) | ORAL | Status: DC
  Administered 2017-06-26 – 2017-07-01 (×10): 100 mg via ORAL
  Filled 2017-06-26 (×12): qty 1

## 2017-06-26 MED ORDER — LIDOCAINE HCL (PF) 1 % IJ SOLN
INTRAMUSCULAR | Status: AC
Start: 2017-06-26 — End: 2017-06-26
  Filled 2017-06-26: qty 30

## 2017-06-26 MED ORDER — MORPHINE SULFATE (PF) 4 MG/ML IV SOLN
2.0000 mg | Freq: Once | INTRAVENOUS | Status: AC
  Administered 2017-06-26: 2 mg via INTRAVENOUS
  Filled 2017-06-26: qty 1

## 2017-06-26 MED ORDER — HEPARIN SODIUM (PORCINE) 5000 UNIT/ML IJ SOLN: 5000.0000 [IU] | Freq: Three times a day (TID) | INTRAMUSCULAR | Status: DC

## 2017-06-26 MED ORDER — HEPARIN (PORCINE) IN NACL 2-0.9 UNIT/ML-% IJ SOLN
INTRAMUSCULAR | Status: AC
  Filled 2017-06-26: qty 1000

## 2017-06-26 MED ORDER — ACETAMINOPHEN 325 MG PO TABS
650.0000 mg | ORAL_TABLET | ORAL | Status: DC | PRN
Start: 2017-06-26 — End: 2017-06-26

## 2017-06-26 MED ORDER — FENTANYL CITRATE (PF) 100 MCG/2ML IJ SOLN
INTRAMUSCULAR | Status: AC
  Filled 2017-06-26: qty 2

## 2017-06-26 MED ORDER — SODIUM CHLORIDE 0.9% FLUSH
3.0000 mL | Freq: Two times a day (BID) | INTRAVENOUS | Status: DC
  Administered 2017-06-27 – 2017-06-30 (×4): 3 mL via INTRAVENOUS

## 2017-06-26 MED ORDER — HYDRALAZINE HCL 20 MG/ML IJ SOLN
INTRAMUSCULAR | Status: DC | PRN
  Administered 2017-06-26 (×2): 10 mg via INTRAVENOUS

## 2017-06-26 MED ORDER — METOPROLOL TARTRATE 12.5 MG HALF TABLET
12.5000 mg | ORAL_TABLET | Freq: Two times a day (BID) | ORAL | Status: DC
  Administered 2017-06-26 – 2017-06-28 (×5): 12.5 mg via ORAL
  Filled 2017-06-26 (×5): qty 1

## 2017-06-26 MED ORDER — ACETAMINOPHEN 325 MG PO TABS
ORAL_TABLET | ORAL | Status: AC
  Filled 2017-06-26: qty 2

## 2017-06-26 MED ORDER — LABETALOL HCL 5 MG/ML IV SOLN
20.0000 mg | INTRAVENOUS | Status: DC | PRN
  Administered 2017-06-26 (×2): 20 mg via INTRAVENOUS
  Filled 2017-06-26: qty 4

## 2017-06-26 MED ORDER — LIDOCAINE HCL (PF) 1 % IJ SOLN
INTRAMUSCULAR | Status: DC | PRN
  Administered 2017-06-26: 10 mL

## 2017-06-26 MED ORDER — NITROGLYCERIN IN D5W 200-5 MCG/ML-% IV SOLN: 0.0000 ug/min | INTRAVENOUS | Status: DC

## 2017-06-26 MED ORDER — NITROGLYCERIN 0.4 MG SL SUBL: 0.4000 mg | SUBLINGUAL_TABLET | SUBLINGUAL | Status: DC | PRN

## 2017-06-26 MED ORDER — HEPARIN (PORCINE) IN NACL 100-0.45 UNIT/ML-% IJ SOLN
1300.0000 [IU]/h | INTRAMUSCULAR | Status: DC
  Administered 2017-06-26: 1150 [IU]/h via INTRAVENOUS
  Administered 2017-06-27 – 2017-07-01 (×5): 1300 [IU]/h via INTRAVENOUS
  Filled 2017-06-26 (×6): qty 250

## 2017-06-26 MED ORDER — INSULIN ASPART 100 UNIT/ML ~~LOC~~ SOLN
0.0000 [IU] | Freq: Three times a day (TID) | SUBCUTANEOUS | Status: DC
  Administered 2017-06-27: 3 [IU] via SUBCUTANEOUS
  Administered 2017-06-27: 2 [IU] via SUBCUTANEOUS
  Administered 2017-06-27: 5 [IU] via SUBCUTANEOUS
  Administered 2017-06-28: 3 [IU] via SUBCUTANEOUS
  Administered 2017-06-28: 2 [IU] via SUBCUTANEOUS
  Administered 2017-06-28: 3 [IU] via SUBCUTANEOUS
  Administered 2017-06-29: 5 [IU] via SUBCUTANEOUS

## 2017-06-26 MED ORDER — GLUCERNA SHAKE PO LIQD
237.0000 mL | Freq: Two times a day (BID) | ORAL | Status: DC
  Administered 2017-06-27 – 2017-06-29 (×4): 237 mL via ORAL

## 2017-06-26 MED ORDER — MIRTAZAPINE 15 MG PO TABS
15.0000 mg | ORAL_TABLET | Freq: Every day | ORAL | Status: DC
  Administered 2017-06-26 – 2017-06-30 (×5): 15 mg via ORAL
  Filled 2017-06-26 (×5): qty 1

## 2017-06-26 MED ORDER — ATORVASTATIN CALCIUM 80 MG PO TABS
80.0000 mg | ORAL_TABLET | Freq: Every day | ORAL | Status: DC
  Administered 2017-06-26 – 2017-06-30 (×5): 80 mg via ORAL
  Filled 2017-06-26 (×5): qty 1

## 2017-06-26 MED ORDER — ADULT MULTIVITAMIN W/MINERALS CH
1.0000 | ORAL_TABLET | Freq: Every day | ORAL | Status: DC
  Administered 2017-06-26 – 2017-07-01 (×6): 1 via ORAL
  Filled 2017-06-26 (×6): qty 1

## 2017-06-26 MED ORDER — LABETALOL HCL 5 MG/ML IV SOLN
INTRAVENOUS | Status: DC | PRN
  Administered 2017-06-26 (×2): 20 mg via INTRAVENOUS

## 2017-06-26 MED ORDER — SODIUM CHLORIDE 0.9% FLUSH: 3.0000 mL | INTRAVENOUS | Status: DC | PRN

## 2017-06-26 MED ORDER — ASPIRIN 81 MG PO TBEC: 81.0000 mg | DELAYED_RELEASE_TABLET | Freq: Every day | ORAL | Status: DC

## 2017-06-26 MED ORDER — ASPIRIN 300 MG RE SUPP
300.0000 mg | RECTAL | Status: AC
  Filled 2017-06-26: qty 1

## 2017-06-26 MED ORDER — SODIUM CHLORIDE 0.9 % IV SOLN
INTRAVENOUS | Status: AC | PRN
  Administered 2017-06-26: 20 mL/h via INTRAVENOUS

## 2017-06-26 MED ORDER — SODIUM CHLORIDE 0.9 % WEIGHT BASED INFUSION: 1.0000 mL/kg/h | INTRAVENOUS | Status: AC

## 2017-06-26 MED ORDER — SERTRALINE HCL 100 MG PO TABS
100.0000 mg | ORAL_TABLET | Freq: Every day | ORAL | Status: DC
  Administered 2017-06-26 – 2017-07-01 (×6): 100 mg via ORAL
  Filled 2017-06-26 (×6): qty 1

## 2017-06-26 SURGICAL SUPPLY — 20 items
CATH INFINITI 5 FR LCB (CATHETERS) ×1 IMPLANT
CATH INFINITI 5 FR RCB (CATHETERS) ×1 IMPLANT
CATH INFINITI 5FR MULTPACK ANG (CATHETERS) ×1 IMPLANT
CATH LAUNCHER 5F NOTO (CATHETERS) IMPLANT
CATHETER LAUNCHER 5F NOTO (CATHETERS) ×2
COVER PRB 48X5XTLSCP FOLD TPE (BAG) IMPLANT
COVER PROBE 5X48 (BAG) ×2
GUIDEWIRE ANGLED .035X150CM (WIRE) ×1 IMPLANT
KIT HEART LEFT (KITS) ×2 IMPLANT
KIT MICROINTRODUCER STIFF 5F (SHEATH) ×1 IMPLANT
PACK CARDIAC CATHETERIZATION (CUSTOM PROCEDURE TRAY) ×2 IMPLANT
PINNACLE LONG 5F 25CM (SHEATH) ×2
SHEATH INTROD PINNACLE 5F 25CM (SHEATH) IMPLANT
SHEATH PINNACLE 5F 10CM (SHEATH) ×1 IMPLANT
SYR MEDRAD MARK V 150ML (SYRINGE) ×2 IMPLANT
TRANSDUCER W/STOPCOCK (MISCELLANEOUS) ×2 IMPLANT
TUBING CIL FLEX 10 FLL-RA (TUBING) ×2 IMPLANT
WIRE EMERALD 3MM-J .035X150CM (WIRE) ×1 IMPLANT
WIRE HI TORQ VERSACORE-J 145CM (WIRE) ×1 IMPLANT
WIRE MICROINTRODUCER 60CM (WIRE) ×1 IMPLANT

## 2017-06-26 NOTE — Progress Notes (Signed)
ANTICOAGULATION CONSULT NOTE - Follow Up Consult  Pharmacy Consult for Heparin Indication: chest pain/ACS  No Known Allergies  Patient Measurements: Height: 6\' 1"  (185.4 cm) Weight: 189 lb (85.7 kg) IBW/kg (Calculated) : 79.9 Heparin Dosing Weight: 85.7 kg  Vital Signs: Temp: 98.8 F (37.1 C) (11/09 1712) Temp Source: Oral (11/09 1712) BP: 158/82 (11/09 1712) Pulse Rate: 88 (11/09 1712)  Labs: Recent Labs    06/26/17 0747  HGB 12.1*  HCT 36.9*  PLT 176  LABPROT 14.6  INR 1.15  CREATININE 1.05    Estimated Creatinine Clearance: 79.3 mL/min (by C-G formula based on SCr of 1.05 mg/dL).  Assessment:  66 yr old male with CAD and hx CABG.  Started IV heparin  ~10am this morning, then stopped for cardiac cath ~11:30am.   Post-cath plan was to change to sq heparin at 10pm tonight, but now with chest pain and to resume IV heparin.  RN reports no bleeding at groin site.  Goal of Therapy:  Heparin level 0.3-0.7 units/ml Monitor platelets by anticoagulation protocol: Yes   Plan:   Resume IV heparin at 1150 units/hr.  No bolus post-cath.  Heparin level ~6 hrs after drip resumes.  Daily heparin level and CBC while on heparin.  SQ heparin discontinued (no doses given yet).  Dennie Fettersgan, Jahyra Sukup Donovan, [place] Pager: [phone] 06/26/2017,6:55 PM

## 2017-06-26 NOTE — Progress Notes (Signed)
Paged on call cardiologist in regards to pt having chest pain  EKG completed Primary RN at bedside, administering SL nitro Awaiting call back

## 2017-06-26 NOTE — Progress Notes (Signed)
Patient in cath holding post procedure. Awake, alert, oriented, MAE. O2 sat 99% on room air but states "I can't breathe" Placed on 2LNC . Rt FA site level zero. Right posterior tibial pulse intermittently palpable and marked.

## 2017-06-26 NOTE — ED Provider Notes (Signed)
[facility] EMERGENCY DEPARTMENT Provider Note   CSN: [id number] Arrival date & time: 06/26/17  0734     History   Chief Complaint Chief Complaint  Patient presents with  . Chest Pain    HPI Jordan Gardner is a 66 y.o. male.  HPI Patient is a 66 year old male with a known history of coronary artery disease status post CABG.  He presents today with constant chest pain radiating towards the left chest with associated shortness of breath since approximately 10 AM yesterday.  Is at constant ongoing discomfort and pain.  He called the ambulance service today who recommended that he take 481 mg aspirins.  He was brought to the ER for evaluation.  He has ongoing discomfort and pain at this time.  He was last hospitalized for this in September 2018 at which point he was found to have a non-ST elevation MI.  He refused heart catheterization at that time.  He states he would be agreeable to heart catheterization at this time.  No history of DVT or pulmonary embolism.  No lightheadedness.  No palpitations.  Of note the patient has severe peripheral vascular disease.  He needs likely bypass surgery by vascular surgery based on operative evaluation towards the end of October 2018.  He has not a candidate for operative revascularization at this time until he has had cardiac evaluation and clearance.  Patient understands he would likely benefit from heart catheterization to define his cardiac issues so that he can move on to the appropriate vascular surgical management of his peripheral vascular disease.    Past Medical History:  Diagnosis Date  . Coronary artery disease   . Diabetes mellitus without complication (HCC)   . Hyperlipidemia   . Hypertension   . PAD (peripheral artery disease) (HCC)   . Protein calorie malnutrition (HCC) 04/2017  . Psychotic affective disorder (HCC)    Per sister. Unsure of Dx. Pt sleeps late and difficult to wake. odd affect  . Sacral decubitus  ulcer 04/2017    Patient Active Problem List   Diagnosis Date Noted  . 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 AKI (acute kidney injury) (HCC) 04/28/2017  . Malnutrition of moderate degree 04/28/2017  . Pressure injury of skin 04/27/2017  . Protein calorie malnutrition (HCC) 04/27/2017  . Physical deconditioning 04/27/2017  . Ulcer of great toe (HCC) 04/27/2017  . Claudication (HCC) 04/27/2017  . Sacral decubitus ulcer 04/27/2017  . Accelerated hypertension   . Hyperlipidemia   . Tobacco abuse disorder   . Altered mental status   . PAD (peripheral artery disease) (HCC)   . NSTEMI (non-ST elevated myocardial infarction) (HCC) 04/25/2017  . NECK PAIN 10/16/2008  . BACK PAIN 10/16/2008    Past Surgical History:  Procedure Laterality Date  . BACK SURGERY    . CARDIAC CATHETERIZATION    . CARDIAC SURGERY     CABG x5 2012 - [facility]       Home Medications    Prior to Admission medications   Medication Sig Start Date End Date Taking? Authorizing Provider  aspirin EC 81 MG EC tablet Take 1 tablet (81 mg total) by mouth daily. 04/30/17   Cleora Fleet, MD  aspirin-acetaminophen-caffeine (EXCEDRIN MIGRAINE) [phone] MG tablet Take 2 tablets by mouth every 8 (eight) hours as needed for headache.    [provider]  atorvastatin (LIPITOR) 40 MG tablet Take 80 mg by mouth at bedtime.    [provider]  cilostazol (PLETAL) 100 MG tablet Take 100 mg by mouth 2 (two) times daily.    [provider]  feeding supplement, GLUCERNA SHAKE, (GLUCERNA SHAKE) LIQD Take 237 mLs by mouth 2 (two) times daily between meals. 04/30/17   Johnson, Clanford L, MD  isosorbide mononitrate (IMDUR) 30 MG 24 hr tablet Take 1 tablet (30 mg total) by mouth daily. 04/30/17   Johnson, Clanford L, MD  metFORMIN (GLUCOPHAGE) 500 MG tablet Take 1,000 mg by mouth 2 (two) times daily with a meal.    [provider]  metoprolol tartrate  (LOPRESSOR) 25 MG tablet Take 12.5 mg by mouth 2 (two) times daily.    [provider]  mirtazapine (REMERON) 15 MG tablet Take 15 mg by mouth at bedtime.    [provider]  Multiple Vitamin (MULTIVITAMIN WITH MINERALS) TABS tablet Take 1 tablet by mouth daily. 04/30/17   Johnson, Clanford L, MD  nicotine (NICODERM CQ - DOSED IN MG/24 HOURS) 14 mg/24hr patch Place 1 patch (14 mg total) onto the skin daily. Patient not taking: Reported on 06/02/2017 04/30/17   Cleora FleetJohnson, Clanford L, MD  nitroGLYCERIN (NITROSTAT) 0.4 MG SL tablet Place 1 tablet (0.4 mg total) under the tongue every 5 (five) minutes x 3 doses as needed for chest pain. 04/29/17   Johnson, Clanford L, MD  sertraline (ZOLOFT) 100 MG tablet Take 100 mg by mouth daily. DEPRESSION/ANXIETY    [provider]    Family History Family History  Problem Relation Age of Onset  . Hypertension Sister     Social History Social History   Tobacco Use  . Smoking status: Current Every Day Smoker    Packs/day: 1.00    Types: Cigarettes  . Smokeless tobacco: Never Used  Substance Use Topics  . Alcohol use: No  . Drug use: No     Allergies   Patient has no known allergies.   Review of Systems Review of Systems  All other systems reviewed and are negative.    Physical Exam Updated Vital Signs BP (!) 175/93   Pulse 73   Resp 15   SpO2 100%   Physical Exam  Constitutional: He is oriented to person, place, and time. He appears well-developed and well-nourished.  HENT:  Head: Normocephalic and atraumatic.  Eyes: EOM are normal.  Neck: Normal range of motion.  Cardiovascular: Normal rate, regular rhythm, normal heart sounds and intact distal pulses.  Pulmonary/Chest: Effort normal and breath sounds normal. No respiratory distress.  Abdominal: Soft. He exhibits no distension. There is no tenderness.  Musculoskeletal: Normal range of motion.  Neurological: He is alert and oriented to person, place, and  time.  Skin: Skin is warm and dry.  Psychiatric: He has a normal mood and affect. Judgment normal.  Nursing note and vitals reviewed.    ED Treatments / Results  Labs (all labs ordered are listed, but only abnormal results are displayed) Labs Reviewed  CBC - Abnormal; Notable for the following components:      Result Value   Hemoglobin 12.1 (*)    HCT 36.9 (*)    All other components within normal limits  BASIC METABOLIC PANEL - Abnormal; Notable for the following components:   Glucose, Bld 133 (*)    All other components within normal limits  I-STAT TROPONIN, ED - Abnormal; Notable for the following components:   Troponin i, poc 3.44 (*)    All other components within normal limits    EKG  EKG Interpretation  Date/Time:  Friday June 26 2017 07:41:43 EST Ventricular Rate:  71 PR Interval:    QRS Duration: 144 QT Interval:  452 QTC Calculation: 492 R Axis:   127 Text Interpretation:  Sinus rhythm Probable left atrial enlargement RBBB and LPFB No significant change was found Confirmed by Azalia Bilisampos, Jorje Vanatta ([id number]) on 06/26/2017 8:33:05 AM       Radiology Dg Chest 2 View  Result Date: 06/26/2017 CLINICAL DATA:  Left upper chest pain today. EXAM: CHEST  2 VIEW COMPARISON:  04/25/2017 and chest CT 04/25/2017 FINDINGS: Median sternotomy wires unchanged. Lungs are adequately inflated with mild stable elevation of the left hemidiaphragm. There is no focal airspace consolidation or effusion. Cardiomediastinal silhouette and remainder of the exam is unchanged. IMPRESSION: No active cardiopulmonary disease. Electronically Signed   By: Elberta Fortisaniel  Boyle M.D.   On: 06/26/2017 08:30    Procedures .Critical Care Performed by: Azalia Bilisampos, Chania Kochanski, MD Authorized by: Azalia Bilisampos, Jerusalen Mateja, MD     Total critical care time: 33 minutes Critical care time was exclusive of separately billable procedures and treating other patients. Critical care was necessary to treat or prevent imminent or  life-threatening deterioration. Critical care was time spent personally by me on the following activities: development of treatment plan with patient and/or surrogate as well as nursing, discussions with consultants, evaluation of patient's response to treatment, examination of patient, obtaining history from patient or surrogate, ordering and performing treatments and interventions, ordering and review of laboratory studies, ordering and review of radiographic studies, pulse oximetry and re-evaluation of patient's condition.   Medications Ordered in ED Medications  nitroGLYCERIN 50 mg in dextrose 5 % 250 mL (0.2 mg/mL) infusion (not administered)     Initial Impression / Assessment and Plan / ED Course  I have reviewed the triage vital signs and the nursing notes.  Pertinent labs & imaging results that were available during my care of the patient were reviewed by me and considered in my medical decision making (see chart for details).     Non-ST elevation MI.  Patient having ongoing chest discomfort at this time.  No obvious ischemic changes noted on his EKG.  Heparin drip now.  Nitroglycerin drip now.  Aspirin prior to arrival.  Cardiology consultation.  We will continue to monitor the patient closely in the emergency department.  He will likely benefit from heart catheterization.  He is agreeable to it at this time.  Final Clinical Impressions(s) / ED Diagnoses   Final diagnoses:  NSTEMI (non-ST elevated myocardial infarction) [facility](HCC)    ED Discharge Orders    None       Azalia Bilisampos, Wasim Hurlbut, MD 06/26/17 [phone]

## 2017-06-26 NOTE — ED Notes (Signed)
Consent obtained for procedure. Pt labels and request for medical records papers tubed to cath lab.

## 2017-06-26 NOTE — Progress Notes (Signed)
Pt states that he is having chest pain. When asked pt states that he has chest pain at home often. Pt given morphine IV. Pt given order medications. Physician is in the room at this time. Will continue to monitor.

## 2017-06-26 NOTE — Progress Notes (Addendum)
Paged by nurse regarding chest pain, EKG no change, cath report indicate patient is poor candidate for any intervention. All previous vein grafts occluded, LIMA to LAD patent but has tight lesion in proximal subclavian prior to LIMA take off. Ordered morphine 2mg  every 2 hours for 4 doses. Unable to initiate nitro gtt on 3E, I have restart him home Imdur but at higher dose of 60mg  daily.   Rechecked on patient after 1 hour. Chest pain went from 8/10 down to 4/10, patient was sleeping when initially seen. Will repeat EKG to make sure no changes.  Ramond DialSigned, Emmely Bittinger PA Pager: [phone]  Addendum: reviewed repeat EKG with Dr. Wyline MoodBranch. Plan is unchanged with medical therapy. Continue to trend troponin. Asked patient to inform nursing staff if chest pain suddenly worsens.

## 2017-06-26 NOTE — ED Notes (Signed)
Pt requesting pain medicine for a HA.

## 2017-06-26 NOTE — ED Triage Notes (Signed)
Pt here from home with c/o chest pain , pt took 13 of his own nitro and ems gave him 2 , pt also had 324 mg

## 2017-06-26 NOTE — Progress Notes (Addendum)
Site area: RFA Site Prior to Removal:  Level 0 Pressure Applied For:4627min Manual:yes    Patient Status During Pull: stable  Post Pull Site:  Level 0 Post Pull Instructions Given:  yes Post Pull Pulses Present:  Dressing Applied:  tegaderm Bedrest begins @1430  [id number]  Comments:

## 2017-06-26 NOTE — Progress Notes (Signed)
NTG off at 1555. BP maintaining at 148/83 post 20 mg labetalol IV.

## 2017-06-26 NOTE — Interval H&P Note (Signed)
Cath Lab Visit (complete for each Cath Lab visit)  Clinical Evaluation Leading to the Procedure:   ACS: Yes.    Non-ACS:    Anginal Classification: CCS IV  Anti-ischemic medical therapy: No Therapy  Non-Invasive Test Results: No non-invasive testing performed  Prior CABG: Previous CABG      History and Physical Interval Note:  06/26/2017 12:10 PM  Jordan Gardner  has presented today for surgery, with the diagnosis of temi/cad  The various methods of treatment have been discussed with the patient and family. After consideration of risks, benefits and other options for treatment, the patient has consented to  Procedure(s): LEFT HEART CATH AND CORS/GRAFTS ANGIOGRAPHY (N/A) as a surgical intervention .  The patient's history has been reviewed, patient examined, no change in status, stable for surgery.  I have reviewed the patient's chart and labs.  Questions were answered to the patient's satisfaction.     Tonny Bollmanooper, Aziah Kaiser

## 2017-06-26 NOTE — ED Notes (Addendum)
Pt taken to xray 

## 2017-06-26 NOTE — Progress Notes (Signed)
ANTICOAGULATION CONSULT NOTE - Initial Consult  Pharmacy Consult for heparin Indication: chest pain/ACS  No Known Allergies  Patient Measurements:   Heparin Dosing Weight: 85 Kg  Vital Signs: BP: 190/89 (11/09 0915) Pulse Rate: 73 (11/09 0915)  Labs: Recent Labs    06/26/17 0747  HGB 12.1*  HCT 36.9*  PLT 176  CREATININE 1.05    CrCl cannot be calculated (Unknown ideal weight.).   Medical History: Past Medical History:  Diagnosis Date  . Coronary artery disease   . Diabetes mellitus without complication (HCC)   . Hyperlipidemia   . Hypertension   . PAD (peripheral artery disease) (HCC)   . Protein calorie malnutrition (HCC) 04/2017  . Psychotic affective disorder (HCC)    Per sister. Unsure of Dx. Pt sleeps late and difficult to wake. odd affect  . Sacral decubitus ulcer 04/2017    Assessment: 66 year old male with a known history of coronary artery disease status post CABG presents with chest pain. Troponin 3.44 No anticoagulation prior to admit, however he does take aspirin and cilostazol. HgB 12.1 and PLT 176. Pharmacy to start heparin infusion.  Goal of Therapy:  Heparin level 0.3-0.7 units/ml Monitor platelets by anticoagulation protocol: Yes   Plan:  Give 4000 units bolus x 1 Start heparin infusion at 1100 units/hr Check anti-Xa level in 6 hours and daily while on heparin Continue to monitor H&H and platelets  Alfie Alderfer L Hendy Brindle 06/26/2017,9:23 AM

## 2017-06-26 NOTE — H&P (Addendum)
Cardiology Admission History and Physical:   Patient ID: Jordan Gardner; MRN: [id number]; DOB: 1950-10-24   Admission date: 06/26/2017  Primary Care Provider: [facility] Primary Cardiologist: [facility]il[facility] Primary Electrophysiologist:  NA  Chief Complaint:  Chest pain with 13 NTG at home  Patient Profile:   Jordan Gardner is a 66 y.o. male with a history of of CAD, CABG 5 years ago at [facility], + tobacco 2ppd, DM-2, HLD and disability walks with walker.  Last admitted 04/25/17 with NSTEMI Pk troponin 0.72EKG with chronic RBBB and LPFB.     History of Present Illness:   Jordan Gardner has a history of of CAD, CABG 5 years ago at [facility], + tobacco 2ppd, DM-2, HLD and disability walks with walker.  Last admitted 04/25/17 with NSTEMI Pk troponin 0.72EKG with chronic RBBB and LPFB.  He refused cath and would only do for refractory chest pain.   He also had episode of unresponsiveness that admit.   EF on Echo was 60-65% G1 DD.  Trivial aortic regurg.    06/08/17 pt admitted with progressive ischemia of both lower ext.  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 There is diffuse aortoiliac occlusive disease and also bilateral hypogastric artery occlusions. 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  Rt and Lt with diffuse disease with rest pain and wound on lt foot.  Recommended revascularization with an aortobifemoral bypass and left femoropopliteal bypass versus axillobifemoral bypass and left femoropopliteal bypass vs primary amputation if felt too high risk for surgery.     [facility] [place]    Today he presented with EMS with chest pain.  Began yesterday he was just smoking and developed Lt ant chest pain associated with diaphoresis and SOB.  No nausea.  He has had 13 NTG and then EMS gave 3 without much change.  He did receive an ASA   EKG:  The ECG that was done pm  arrival to ER 06/26/17  was personally reviewed and demonstrates SR with RBBB and LAFB, no changes from previous admit.  Troponin 3.44 K+ 3.8, Na 136, K+ 3.8, Cr 1.05 H/H 12.1/36.9  CXR 2V with no acute process.    Currently still with chest pain.  And BP is elevated will increase IV NTG.  Add IV morphine. I will give GI cocktail as well as he complains of indigestion.  He does agree to the cath.   He has not seen his cardiologist since last admit  Past Medical History:  Diagnosis Date  . Coronary artery disease   . Diabetes mellitus without complication (HCC)   . Hyperlipidemia   . Hypertension   . PAD (peripheral artery disease) (HCC)   . Protein calorie malnutrition (HCC) 04/2017  . Psychotic affective disorder (HCC)    Per sister. Unsure of Dx. Pt sleeps late and difficult to wake. odd affect  . Sacral decubitus ulcer 04/2017    Past Surgical History:  Procedure Laterality Date  . BACK SURGERY    . CARDIAC CATHETERIZATION    . CARDIAC SURGERY     CABG x5 2012 - [facility]     Medications Prior to Admission: Prior to Admission medications   Medication Sig Start Date End Date Taking? Authorizing Provider  aspirin EC 81 MG EC tablet Take 1 tablet (81 mg total) by mouth daily. 04/30/17  Yes Cleora Fleet, MD  aspirin-acetaminophen-caffeine (EXCEDRIN MIGRAINE) [phone] MG  tablet Take 2 tablets by mouth every 8 (eight) hours as needed for headache.   Yes [provider]  atorvastatin (LIPITOR) 40 MG tablet Take 80 mg by mouth at bedtime.   Yes [provider]  cilostazol (PLETAL) 100 MG tablet Take 100 mg by mouth 2 (two) times daily.   Yes [provider]  isosorbide mononitrate (IMDUR) 30 MG 24 hr tablet Take 1 tablet (30 mg total) by mouth daily. 04/30/17  Yes Johnson, Clanford L, MD  metFORMIN (GLUCOPHAGE) 500 MG tablet Take 1,000 mg by mouth 2 (two) times daily with a meal.   Yes [provider]  metoprolol tartrate (LOPRESSOR) 25 MG tablet  Take 12.5 mg by mouth 2 (two) times daily.   Yes [provider]  mirtazapine (REMERON) 15 MG tablet Take 15 mg by mouth at bedtime.   Yes [provider]  Multiple Vitamin (MULTIVITAMIN WITH MINERALS) TABS tablet Take 1 tablet by mouth daily. 04/30/17  Yes Johnson, Clanford L, MD  nitroGLYCERIN (NITROSTAT) 0.4 MG SL tablet Place 1 tablet (0.4 mg total) under the tongue every 5 (five) minutes x 3 doses as needed for chest pain. 04/29/17  Yes Johnson, Clanford L, MD  feeding supplement, GLUCERNA SHAKE, (GLUCERNA SHAKE) LIQD Take 237 mLs by mouth 2 (two) times daily between meals. Patient not taking: Reported on 06/26/2017 04/30/17   Standley DakinsJohnson, Clanford L, MD  nicotine (NICODERM CQ - DOSED IN MG/24 HOURS) 14 mg/24hr patch Place 1 patch (14 mg total) onto the skin daily. Patient not taking: Reported on 06/02/2017 04/30/17   Cleora FleetJohnson, Clanford L, MD  sertraline (ZOLOFT) 100 MG tablet Take 100 mg by mouth daily. DEPRESSION/ANXIETY    [provider]     Allergies:   No Known Allergies  Social History:   Social History   Socioeconomic History  . Marital status: Single    Spouse name: Not on file  . Number of children: Not on file  . Years of education: Not on file  . Highest education level: Not on file  Social Needs  . Financial resource strain: Not on file  . Food insecurity - worry: Not on file  . Food insecurity - inability: Not on file  . Transportation needs - medical: Not on file  . Transportation needs - non-medical: Not on file  Occupational History  . Not on file  Tobacco Use  . Smoking status: Current Every Day Smoker    Packs/day: 1.00    Types: Cigarettes  . Smokeless tobacco: Never Used  Substance and Sexual Activity  . Alcohol use: No  . Drug use: No  . Sexual activity: Not on file  Other Topics Concern  . Not on file  Social History Narrative  . Not on file    Family History:   The patient's family history includes Hypertension in his sister.     ROS:  Please see the history of present illness.  General:no colds or fevers, no weight changes Skin:no rashes or ulcers ulcers have healed HEENT:no blurred vision, no congestion CV:see HPI PUL:see HPI GI:no diarrhea constipation or melena, no indigestion GU:no hematuria, no dysuria MS:no joint pain, no claudication Neuro:no syncope, no lightheadedness Endo:+ diabetes, no thyroid disease   Physical Exam/Data:   Vitals:   06/26/17 0800 06/26/17 0830 06/26/17 0900 06/26/17 0915  BP: (!) 175/93 (!) 189/92 (!) 172/83 (!) 190/89  Pulse: 73 71 73 73  Resp: 15 15 (!) 22 18  SpO2: 100% 100% 100% 100%  Weight:  189 lb (85.7 kg)   No intake or output data in the 24 hours ending 06/26/17 0935 Filed Weights   06/26/17 0915  Weight: 189 lb (85.7 kg)   Body mass index is 24.94 kg/m.  General:  Well nourished, well developed, in no acute distress HEENT: normal Lymph: no adenopathy Neck: no JVD Endocrine:  No thryomegaly Vascular: No carotid bruits; do not palpate pedal pulses known CAD  Cardiac:  normal S1, S2; RRR; no murmur gallup rub or click Lungs:  clear to auscultation bilaterally, no wheezing, rhonchi or rales  Abd: soft, nontender, no hepatomegaly  Ext: no lower ext edema Musculoskeletal:  No deformities, BUE and BLE strength normal and equal Skin: warm and dry, brisk capillary refill Rt gt toe with very small healing area. Neuro:  Alert and oriented X 3 but has trouble remembering his need to be NPO for cath.  No focal abnormalities noted Psych:  Normal affect  Sacrum:  I do not see any ulcer, he does note it is sore.     Relevant CV Studies: 04/27/17 echo Study Conclusions  - Left ventricle: The cavity size was normal. Wall thickness was  increased in a pattern of mild LVH. Systolic function was normal.  The estimated ejection fraction was in the range of 60% to 65%.  Doppler parameters are consistent with abnormal left ventricular  relaxation (grade 1  diastolic dysfunction).  - Aortic valve: There was trivial regurgitation.    Laboratory Data:  Chemistry Recent Labs  Lab 06/26/17 0747  NA 136  K 3.8  CL 107  CO2 22  GLUCOSE 133*  BUN 11  CREATININE 1.05  CALCIUM 8.9  GFRNONAA >60  GFRAA >60  ANIONGAP 7    No results for input(s): PROT, ALBUMIN, AST, ALT, ALKPHOS, BILITOT in the last 168 hours. Hematology Recent Labs  Lab 06/26/17 0747  WBC 7.7  RBC 4.42  HGB 12.1*  HCT 36.9*  MCV 83.5  MCH 27.4  MCHC 32.8  RDW 13.5  PLT 176   Cardiac EnzymesNo results for input(s): TROPONINI in the last 168 hours.  Recent Labs  Lab 06/26/17 0809  TROPIPOC 3.44*    BNPNo results for input(s): BNP, PROBNP in the last 168 hours.  DDimer No results for input(s): DDIMER in the last 168 hours.  Radiology/Studies:  Dg Chest 2 View  Result Date: 06/26/2017 CLINICAL DATA:  Left upper chest pain today. EXAM: CHEST  2 VIEW COMPARISON:  04/25/2017 and chest CT 04/25/2017 FINDINGS: Median sternotomy wires unchanged. Lungs are adequately inflated with mild stable elevation of the left hemidiaphragm. There is no focal airspace consolidation or effusion. Cardiomediastinal silhouette and remainder of the exam is unchanged. IMPRESSION: No active cardiopulmonary disease. Electronically Signed   By: Elberta Fortis M.D.   On: 06/26/2017 08:30    Assessment and Plan:   1. NSTEMI with troponin 3.44 most likely MI began yesterday afternoon. IV heparin and IV NTG to control pain.  Serial troponins. Plan cardiac cath.  He is agreeable.  Have asked nurse to obtain OP note for CABG at [facility] Dr. Tresa Endo has seen  2.          HTN elevated, on IV NTG will add BB  3.          HLD  Increase lipitor  4.          DM-2 hold metformin,  Add SSI  5.          PAD with need for surgery  revascularization with an aortobifemoral bypass and left femoropopliteal bypass versus axillobifemoral bypass and left femoropopliteal bypass vs primary amputation if felt  too high risk for surgery.   Dr. Edilia BoIckson   6.           Tobacco use has decreased to 1 ppd encouraged cessation  Has nicotine patch  7.           Mal nutrition, on Glucerna shake   8.            Hx sacral wound, healed though pain at the site.  Toe ulcer healing  Severity of Illness: The appropriate patient status for this patient is INPATIENT. Inpatient status is judged to be reasonable and necessary in order to provide the required intensity of service to ensure the patient's safety. The patient's presenting symptoms, physical exam findings, and initial radiographic and laboratory data in the context of their chronic comorbidities is felt to place them at high risk for further clinical deterioration. Furthermore, it is not anticipated that the patient will be medically stable for discharge from the hospital within 2 midnights of admission. The following factors support the patient status of inpatient.   " The patient's presenting symptoms include chest pain. " The worrisome physical exam findings include chest pain, abnormal labs. " The initial radiographic and laboratory data are worrisome because of NSTEMI . " The chronic co-morbidities include PAD, DM-2, CAD.   * I certify that at the point of admission it is my clinical judgment that the patient will require inpatient hospital care spanning beyond 2 midnights from the point of admission due to high intensity of service, high risk for further deterioration and high frequency of surveillance required.*    For questions or updates, please contact [facility] Please consult [email or web address] for contact info under Cardiology/STEMI.    Signed, Nada BoozerLaura Ingold, NP  06/26/2017 9:35 AM    Patient seen and examined. Agree with assessment and plan. Jordan Gardner is a 66 year old African-American male who has a 53 year history of tobacco use, a history of diabetes mellitus, hyperlipidemia, and [facility]  He has no idea how many grafts were placed. Marland Kitchen.  He also has had difficulty with walking with claudication.  He was admitted in September 2018 with a non-ST segment elevation MI.  He refused cardiac catheterization at that time. He recently was admitted with progressive ischemia of his lower extremities and was found to have bilateral renal artery narrowing of 70 and 60% in the right and left renal arteries.  There was diffuse aortoiliac occlusive disease with bilateral hypogastric artery occlusions and greater than 50% infrarenal stenosis of his aorta.  There was diffuse disease in his iliacs bilaterally.  He will ultimately need peripheral vascular  revascularization in the future as outlined above with possible aortobifemoral bypass and left femoropopliteal bypass versus axillobifemoral bypass and left femoropopliteal bypass vs primary amputation. He was now readmitted with recurrent chest pain which started yesterday.  Troponin is positive at 3.44.  ECG does not show acute ST segment elevation and shows sinus rhythm at 71 with right bundle branch block and repolarization changes. The patient was started on heparin and  IV nitroglycerin.  This are in drip due to significant hypertension and has been titrated up to 30 g so far. We will [facility] to [facility].  Plan is for cardiac catheterization today. I have reviewed the  risks, indications, and alternatives to cardiac catheterization, possible angioplasty, and stenting with the patient. Risks include but are not limited to bleeding, infection, vascular injury, stroke, myocardial infection, arrhythmia, kidney injury, radiation-related injury in the case of prolonged fluoroscopy use, emergency cardiac surgery, and death. The patient understands the risks of serious complication is 1-2 in 1000 with diagnostic cardiac cath and 1-2% or less with angioplasty/stenting.    Lennette Bihari,  MD, [facility] 06/26/2017 11:29 AM

## 2017-06-26 NOTE — Progress Notes (Signed)
Trop was reported to Cards PA. No new orders. Pt is resting at this time.

## 2017-06-27 ENCOUNTER — Other Ambulatory Visit: Payer: Self-pay

## 2017-06-27 ENCOUNTER — Inpatient Hospital Stay (HOSPITAL_COMMUNITY): Payer: Medicare HMO

## 2017-06-27 DIAGNOSIS — R079 Chest pain, unspecified: Secondary | ICD-10-CM

## 2017-06-27 LAB — GLUCOSE, CAPILLARY
GLUCOSE-CAPILLARY: 217 mg/dL — AB (ref 65–99)
GLUCOSE-CAPILLARY: 196 mg/dL — AB (ref 65–99)
Glucose-Capillary: 168 mg/dL — ABNORMAL HIGH (ref 65–99)
Glucose-Capillary: 264 mg/dL — ABNORMAL HIGH (ref 65–99)

## 2017-06-27 LAB — BASIC METABOLIC PANEL
ANION GAP: 7 (ref 5–15)
BUN: 12 mg/dL (ref 6–20)
CALCIUM: 8.8 mg/dL — AB (ref 8.9–10.3)
CHLORIDE: 107 mmol/L (ref 101–111)
CO2: 23 mmol/L (ref 22–32)
CREATININE: 1.08 mg/dL (ref 0.61–1.24)
GFR calc Af Amer: >60 mL/min (ref >60)
Glucose, Bld: 157 mg/dL — ABNORMAL HIGH (ref 65–99)
Potassium: 4.1 mmol/L (ref 3.5–5.1)
Sodium: 137 mmol/L (ref 135–145)

## 2017-06-27 LAB — TROPONIN I
Troponin I: 15.58 ng/mL (ref <0.03)
Troponin I: 15.58 ng/mL (ref <0.03)
Troponin I: 15.88 ng/mL (ref <0.03)

## 2017-06-27 LAB — CBC
HEMATOCRIT: 37.1 % — AB (ref 39.0–52.0)
Hemoglobin: 12.4 g/dL — ABNORMAL LOW (ref 13.0–17.0)
MCH: 27.4 pg (ref 26.0–34.0)
MCHC: 33.4 g/dL (ref 30.0–36.0)
MCV: 82.1 fL (ref 78.0–100.0)
Platelets: 197 10*3/uL (ref 150–400)
RBC: 4.52 MIL/uL (ref 4.22–5.81)
RDW: 13.5 % (ref 11.5–15.5)
WBC: 10.5 10*3/uL (ref 4.0–10.5)

## 2017-06-27 LAB — ECHOCARDIOGRAM COMPLETE
AVPHT: 204 ms
CHL CUP MV DEC (S): 215
E decel time: 215 msec
E/e' ratio: 12.73
FS: 11 % — AB (ref 28–44)
HEIGHTINCHES: 73 in
IVS/LV PW RATIO, ED: 1.05
LA ID, A-P, ES: 29 mm
LAVOL: 76.7 mL
LAVOLA4C: 79.9 mL
LDCA: 3.46 cm2
LEFT ATRIUM END SYS DIAM: 29 mm
LV E/e' medial: 12.73
LV PW d: 11.1 mm — AB (ref 0.6–1.1)
LV TDI E'MEDIAL: 5.11
LV e' LATERAL: 5.87 cm/s
LVEEAVG: 12.73
LVOTD: 21 mm
Lateral S' vel: 9.9 cm/s
MV Peak grad: 2 mmHg
MV pk A vel: 85.9 m/s
MV pk E vel: 74.7 m/s
RV TAPSE: 17.4 mm
TDI e' lateral: 5.87
WEIGHTICAEL: 2678.4 [oz_av]

## 2017-06-27 LAB — LIPID PANEL
CHOLESTEROL: 109 mg/dL (ref 0–200)
HDL: 36 mg/dL — ABNORMAL LOW (ref >40)
LDL Cholesterol: 58 mg/dL (ref 0–99)
TRIGLYCERIDES: 77 mg/dL (ref <150)
Total CHOL/HDL Ratio: 3 RATIO
VLDL: 15 mg/dL (ref 0–40)

## 2017-06-27 LAB — HEPARIN LEVEL (UNFRACTIONATED)
HEPARIN UNFRACTIONATED: 0.46 [IU]/mL (ref 0.30–0.70)
HEPARIN UNFRACTIONATED: 0.32 [IU]/mL (ref 0.30–0.70)
Heparin Unfractionated: 0.19 IU/mL — ABNORMAL LOW (ref 0.30–0.70)

## 2017-06-27 MED ORDER — LISINOPRIL 2.5 MG PO TABS
2.5000 mg | ORAL_TABLET | Freq: Every day | ORAL | Status: DC
  Administered 2017-06-27 – 2017-07-01 (×5): 2.5 mg via ORAL
  Filled 2017-06-27 (×5): qty 1

## 2017-06-27 NOTE — Progress Notes (Signed)
ANTICOAGULATION CONSULT NOTE - Follow Up Consult  Pharmacy Consult for Heparin  Indication: chest pain/ACS  No Known Allergies  Patient Measurements: Height: 6\' 1"  (185.4 cm) Weight: 167 lb 6.4 oz (75.9 kg)(a scale; reweighed twice) IBW/kg (Calculated) : 79.9  Vital Signs: Temp: 97.8 F (36.6 C) (11/10 1149) Temp Source: Oral (11/10 1149) BP: 110/70 (11/10 1154) Pulse Rate: 82 (11/10 1149)  Labs: Recent Labs    06/26/17 0747 06/26/17 1744 06/26/17 1850 06/27/17 0211 06/27/17 1050  HGB 12.1*  --   --  12.4*  --   HCT 36.9*  --   --  37.1*  --   PLT 176  --   --  197  --   LABPROT 14.6  --  14.5  --   --   INR 1.15  --  1.14  --   --   HEPARINUNFRC  --   --   --  0.19* 0.46  CREATININE 1.05  --   --  1.08  --   TROPONINI  --  7.07*  --   --  15.58*    Estimated Creatinine Clearance: 73.2 mL/min (by C-G formula based on SCr of 1.08 mg/dL).    Assessment: 66 yo male with NSTEMI s/p cath. Heparin continues for recurrent CP. He is also noted with PAD but per notes high surgical risk -Heparin level at goal  Goal of Therapy:  Heparin level 0.3-0.7 units/ml Monitor platelets by anticoagulation protocol: Yes   Plan:  -No heparin changes needed -Daily CBC and heparin level -Will follow for duration of therapy  Harland Germanndrew Arnoldo Hildreth, Pharm D 06/27/2017 1:06 PM

## 2017-06-27 NOTE — Progress Notes (Signed)
Progress Note  Patient Name: Jordan Gardner Date of Encounter: 06/27/2017   Subjective   Chest pain yesterday evening, resolved with morphine and increased doses of oral nitrate. No pain this AM  Inpatient Medications    Scheduled Meds: . aspirin EC  81 mg Oral Daily  . atorvastatin  80 mg Oral q1800  . cilostazol  100 mg Oral BID  . feeding supplement (GLUCERNA SHAKE)  237 mL Oral BID BM  . insulin aspart  0-5 Units Subcutaneous QHS  . insulin aspart  0-9 Units Subcutaneous TID WC  . isosorbide mononitrate  60 mg Oral Daily  . metoprolol tartrate  12.5 mg Oral BID  . mirtazapine  15 mg Oral QHS  . multivitamin with minerals  1 tablet Oral Daily  . nicotine  14 mg Transdermal Daily  . sertraline  100 mg Oral Daily  . sodium chloride flush  3 mL Intravenous Q12H   Continuous Infusions: . sodium chloride    . heparin 1,300 Units/hr (06/27/17 0916)   PRN Meds: sodium chloride, acetaminophen, hydrALAZINE, labetalol, morphine injection, nitroGLYCERIN, ondansetron (ZOFRAN) IV, sodium chloride flush   Vital Signs    Vitals:   06/26/17 1712 06/26/17 2001 06/26/17 2045 06/27/17 0453  BP: (!) 158/82 132/78 (!) 158/76 127/65  Pulse: 88 94 93 85  Resp:  16  15  Temp: 98.8 F (37.1 C) 98.6 F (37 C)  98.5 F (36.9 C)  TempSrc: Oral Oral  Oral  SpO2: 99% 100% 100% 97%  Weight:    167 lb 6.4 oz (75.9 kg)  Height: 6\' 1"  (1.854 m)       Intake/Output Summary (Last 24 hours) at 06/27/2017 0953 Last data filed at 06/26/2017 2300 Gross per 24 hour  Intake -  Output 150 ml  Net -150 ml   Filed Weights   06/26/17 0915 06/27/17 0453  Weight: 189 lb (85.7 kg) 167 lb 6.4 oz (75.9 kg)    Telemetry    SR  ECG    n/a  Physical Exam   GEN: No acute distress.   Neck: No JVD Cardiac: RRR, no murmurs, rubs, or gallops.  Respiratory: Clear to auscultation bilaterally. GI: Soft, nontender, non-distended  MS: No edema; No deformity. Neuro:  Nonfocal  Psych: Normal  affect   Labs    Chemistry Recent Labs  Lab 06/26/17 0747 06/26/17 1850 06/27/17 0211  NA 136  --  137  K 3.8  --  4.1  CL 107  --  107  CO2 22  --  23  GLUCOSE 133*  --  157*  BUN 11  --  12  CREATININE 1.05  --  1.08  CALCIUM 8.9  --  8.8*  PROT  --  6.3*  --   ALBUMIN  --  3.4*  --   AST  --  66*  --   ALT  --  14*  --   ALKPHOS  --  84  --   BILITOT  --  0.7  --   GFRNONAA >60  --  >60  GFRAA >60  --  >60  ANIONGAP 7  --  7     Hematology Recent Labs  Lab 06/26/17 0747 06/27/17 0211  WBC 7.7 10.5  RBC 4.42 4.52  HGB 12.1* 12.4*  HCT 36.9* 37.1*  MCV 83.5 82.1  MCH 27.4 27.4  MCHC 32.8 33.4  RDW 13.5 13.5  PLT 176 197    Cardiac Enzymes Recent Labs  Lab 06/26/17 1744  TROPONINI 7.07*    Recent Labs  Lab 06/26/17 0809  TROPIPOC 3.44*     BNPNo results for input(s): BNP, PROBNP in the last 168 hours.   DDimer No results for input(s): DDIMER in the last 168 hours.   Radiology    Dg Chest 2 View  Result Date: 06/26/2017 CLINICAL DATA:  Left upper chest pain today. EXAM: CHEST  2 VIEW COMPARISON:  04/25/2017 and chest CT 04/25/2017 FINDINGS: Median sternotomy wires unchanged. Lungs are adequately inflated with mild stable elevation of the left hemidiaphragm. There is no focal airspace consolidation or effusion. Cardiomediastinal silhouette and remainder of the exam is unchanged. IMPRESSION: No active cardiopulmonary disease. Electronically Signed   By: Elberta Fortisaniel  Boyle M.D.   On: 06/26/2017 08:30    Cardiac Studies   06/26/17 cath Left Anterior Descending  Prox LAD lesion 75% stenosed  Prox LAD lesion is 75% stenosed. The lesion is mildly calcified.  First Diagonal Branch  Ost 1st Diag lesion 90% stenosed  Ost 1st Diag lesion is 90% stenosed. ostial stenosis as the vessel originates from the LAD - large vessel.  Second Diagonal [employer]  Ost 2nd Diag lesion 80% stenosed  Ost 2nd Diag lesion is 80% stenosed. medium caliber vessel  Left Circumflex   Prox Cx lesion 100% stenosed  Prox Cx lesion is 100% stenosed.  Right Coronary Artery  Mid RCA lesion 100% stenosed  Mid RCA lesion is 100% stenosed.  Second Right Posterolateral  Collaterals  2nd RPLB filled by collaterals from 1st Sept.    LIMA Graft to Mid LAD  the LIMA is patent, but the left subclavian has severe stenosis in the proximal vessel (proximal to the vertebral)  Graft to Dist RCA  SVG-RCA is occluded at the origin of the graft  Origin lesion 100% stenosed  Origin lesion is 100% stenosed.  Graft to 2nd Mrg  Origin lesion 100% stenosed  Origin lesion is 100% stenosed.  Graft to 1st Mrg  Prox Graft lesion 100% stenosed  Prox Graft lesion is 100% stenosed. angiographic appears of subacute occlusion (likely culprit for NSTEMI)     Patient Profile     Jordan Gardner is a 66 y.o. male with a [facility] of of [facility], + tobacco 2ppd, DM-2, HLD and disability walks with walker.Last admitted 04/25/17 with NSTEMI Pk troponin 0.72EKG with chronic RBBB and LPFB.      Assessment & Plan    1. Chest pain/CAD - [facility] [place] - admitted with chest pain. Trop up to 7 without clear peak. No clear EKG changes - cath yesterday as reported above. Essentially severe 3 vessel native disease with occluded RCA, LAD, LCX. 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 Occluded SVG-RCA, occluded SVG-OM2, occluded SVG-OM1.  - recs per interventional cardiolgy for medical therapy. Notes reporte subclavian stenosis and ostial diag could be intervened but unclear of benefit.  - 04/2017 echo LVEF 60-65%, grade I diasotlic dysfunction - medical therapy with ASA, atorva 80, hep gtt, imdur 60, lopressor 12.5mg  bid. Start low dose lisinopril 2.5 mg daily - continue heparin until troponin downtrends from cardiac standpoint.  - repeat echo in setting of NSTEMI, LV gram  suggests new mild systolic dysfunction - follow clinical course over the weekend, may need to rediscuss with interventional cardiology pending progress - chest pain last night resolved with morphine and uptitration of oral nitrates, no  pain this AM   2. PAD/Critical limb ischemia - high risk for vascualr surgery based on cath and NSTEMI For questions or updates, please contact [facility] Please consult [email or web address] for contact info under Cardiology/STEMI.      Joanie Coddington, MD  06/27/2017, 9:53 AM

## 2017-06-27 NOTE — Progress Notes (Signed)
ANTICOAGULATION CONSULT NOTE - Follow Up Consult  Pharmacy Consult for Heparin  Indication: chest pain/ACS  No Known Allergies  Patient Measurements: Height: 6\' 1"  (185.4 cm) Weight: 167 lb 6.4 oz (75.9 kg)(a scale; reweighed twice) IBW/kg (Calculated) : 79.9  Vital Signs: Temp: 97.8 F (36.6 C) (11/10 1149) Temp Source: Oral (11/10 1149) BP: 110/70 (11/10 1154) Pulse Rate: 82 (11/10 1149)  Labs: Recent Labs    06/26/17 0747 06/26/17 1744 06/26/17 1850 06/27/17 0211 06/27/17 1050 06/27/17 1608  HGB 12.1*  --   --  12.4*  --   --   HCT 36.9*  --   --  37.1*  --   --   PLT 176  --   --  197  --   --   LABPROT 14.6  --  14.5  --   --   --   INR 1.15  --  1.14  --   --   --   HEPARINUNFRC  --   --   --  0.19* 0.46 0.32  CREATININE 1.05  --   --  1.08  --   --   TROPONINI  --  7.07*  --   --  15.58* 15.58*    Estimated Creatinine Clearance: 73.2 mL/min (by C-G formula based on SCr of 1.08 mg/dL).    Assessment: 66 yo male with NSTEMI s/p cath. Heparin continues for recurrent CP. He is also noted with PAD but per notes high surgical risk -Heparin level =0.32, remains therapeutic on heparin drip 1300 units/hr.  No bleeding noted. RN noted Cardiac cath insertion site (Rt groin level 0, CDI).  Goal of Therapy:  Heparin level 0.3-0.7 units/ml Monitor platelets by anticoagulation protocol: Yes   Plan:  -No heparin changes needed, continue Heparin IV at 1300 units/hr -Daily CBC and heparin level -Will follow for duration of therapy  Jordan Gardner, RPh Clinical Pharmacist Main pharmacy [phone]  06/27/2017 6:36 PM

## 2017-06-27 NOTE — Progress Notes (Signed)
*  PRELIMINARY RESULTS* Echocardiogram 2D Echocardiogram has been performed.  Jeryl Columbialliott, Demica Zook 06/27/2017, 2:11 PM

## 2017-06-27 NOTE — Progress Notes (Signed)
ANTICOAGULATION CONSULT NOTE - Follow Up Consult  Pharmacy Consult for Heparin  Indication: chest pain/ACS  No Known Allergies  Patient Measurements: Height: 6\' 1"  (185.4 cm) Weight: 189 lb (85.7 kg) IBW/kg (Calculated) : 79.9  Vital Signs: Temp: 98.6 F (37 C) (11/09 2001) Temp Source: Oral (11/09 2001) BP: 158/76 (11/09 2045) Pulse Rate: 93 (11/09 2045)  Labs: Recent Labs    06/26/17 0747 06/26/17 1744 06/26/17 1850 06/27/17 0211  HGB 12.1*  --   --  12.4*  HCT 36.9*  --   --  37.1*  PLT 176  --   --  197  LABPROT 14.6  --  14.5  --   INR 1.15  --  1.14  --   HEPARINUNFRC  --   --   --  0.19*  CREATININE 1.05  --   --   --   TROPONINI  --  7.07*  --   --     Estimated Creatinine Clearance: 79.3 mL/min (by C-G formula based on SCr of 1.05 mg/dL).    Assessment: Heparin for CP, sub-therapeutic heparin level after re-start s/p cath, no issues per RN.   Goal of Therapy:  Heparin level 0.3-0.7 units/ml Monitor platelets by anticoagulation protocol: Yes   Plan:  Inc heparin to 1300 units/hr 1100 HL  Neveyah Garzon 06/27/2017,3:01 AM

## 2017-06-27 NOTE — Progress Notes (Signed)
Pt stable throughout the shift, vitals stable, no any complain of chest pain, SOB and distress. Oxygen continue via [place] @2l /min, MD aware about patient's current troponin level, discussed with MD this AM, IV heparin continue @13cc /hr, Cardiac cath insertion site (Rt groin level 0, CDI), will continue to monitor the patient

## 2017-06-27 NOTE — Procedures (Signed)
Pt stood by bedside to void via urinal. Pt tolerated standing by bedside well with 1 person assist. Pt stated he could not void in urinal in bed.  Pt had small incontinence of urine.  Full linen changes & bath completed. Pt states that he is feeling better & is happy he isn't having chest pain at this time. Will continue to monitor.

## 2017-06-28 ENCOUNTER — Other Ambulatory Visit: Payer: Self-pay

## 2017-06-28 LAB — BASIC METABOLIC PANEL
ANION GAP: 8 (ref 5–15)
BUN: 12 mg/dL (ref 6–20)
CHLORIDE: 106 mmol/L (ref 101–111)
CO2: 20 mmol/L — ABNORMAL LOW (ref 22–32)
Calcium: 8.4 mg/dL — ABNORMAL LOW (ref 8.9–10.3)
Creatinine, Ser: 1.13 mg/dL (ref 0.61–1.24)
GFR calc Af Amer: >60 mL/min (ref >60)
GFR calc non Af Amer: >60 mL/min (ref >60)
GLUCOSE: 156 mg/dL — AB (ref 65–99)
POTASSIUM: 4.4 mmol/L (ref 3.5–5.1)
Sodium: 134 mmol/L — ABNORMAL LOW (ref 135–145)

## 2017-06-28 LAB — CBC
HEMATOCRIT: 32.3 % — AB (ref 39.0–52.0)
HEMOGLOBIN: 10.4 g/dL — AB (ref 13.0–17.0)
MCH: 26.7 pg (ref 26.0–34.0)
MCHC: 32.2 g/dL (ref 30.0–36.0)
MCV: 82.8 fL (ref 78.0–100.0)
Platelets: 154 10*3/uL (ref 150–400)
RBC: 3.9 MIL/uL — AB (ref 4.22–5.81)
RDW: 13.7 % (ref 11.5–15.5)
WBC: 10.2 10*3/uL (ref 4.0–10.5)

## 2017-06-28 LAB — GLUCOSE, CAPILLARY
GLUCOSE-CAPILLARY: 180 mg/dL — AB (ref 65–99)
GLUCOSE-CAPILLARY: 216 mg/dL — AB (ref 65–99)
Glucose-Capillary: 248 mg/dL — ABNORMAL HIGH (ref 65–99)
Glucose-Capillary: 250 mg/dL — ABNORMAL HIGH (ref 65–99)

## 2017-06-28 LAB — HEPARIN LEVEL (UNFRACTIONATED): Heparin Unfractionated: 0.5 IU/mL (ref 0.30–0.70)

## 2017-06-28 LAB — TROPONIN I: TROPONIN I: 12.06 ng/mL — AB (ref <0.03)

## 2017-06-28 MED ORDER — GI COCKTAIL ~~LOC~~
30.0000 mL | Freq: Three times a day (TID) | ORAL | Status: DC | PRN
  Administered 2017-06-28: 30 mL via ORAL
  Filled 2017-06-28: qty 30

## 2017-06-28 MED ORDER — PNEUMOCOCCAL VAC POLYVALENT 25 MCG/0.5ML IJ INJ
0.5000 mL | INJECTION | INTRAMUSCULAR | Status: AC
  Administered 2017-06-29: 0.5 mL via INTRAMUSCULAR
  Filled 2017-06-28: qty 0.5

## 2017-06-28 MED ORDER — INFLUENZA VAC SPLIT HIGH-DOSE 0.5 ML IM SUSY
0.5000 mL | PREFILLED_SYRINGE | INTRAMUSCULAR | Status: AC
  Administered 2017-06-29: 0.5 mL via INTRAMUSCULAR
  Filled 2017-06-28: qty 0.5

## 2017-06-28 NOTE — Progress Notes (Signed)
Progress Note  Patient Name: Jordan Gardner Date of Encounter: 06/28/2017   Subjective   No symptoms overnight.   Inpatient Medications    Scheduled Meds: . aspirin EC  81 mg Oral Daily  . atorvastatin  80 mg Oral q1800  . cilostazol  100 mg Oral BID  . feeding supplement (GLUCERNA SHAKE)  237 mL Oral BID BM  . insulin aspart  0-5 Units Subcutaneous QHS  . insulin aspart  0-9 Units Subcutaneous TID WC  . isosorbide mononitrate  60 mg Oral Daily  . lisinopril  2.5 mg Oral Daily  . metoprolol tartrate  12.5 mg Oral BID  . mirtazapine  15 mg Oral QHS  . multivitamin with minerals  1 tablet Oral Daily  . nicotine  14 mg Transdermal Daily  . sertraline  100 mg Oral Daily  . sodium chloride flush  3 mL Intravenous Q12H   Continuous Infusions: . sodium chloride    . heparin 1,300 Units/hr (06/28/17 0326)   PRN Meds: sodium chloride, acetaminophen, hydrALAZINE, labetalol, morphine injection, nitroGLYCERIN, ondansetron (ZOFRAN) IV, sodium chloride flush   Vital Signs    Vitals:   06/27/17 1945 06/28/17 0016 06/28/17 0449 06/28/17 0738  BP: 133/72 138/69 132/71 (!) 151/75  Pulse: 94 75 88 82  Resp: 18 17 19 18   Temp: 98.1 F (36.7 C) 98.2 F (36.8 C) 98.1 F (36.7 C) 98.7 F (37.1 C)  TempSrc: Oral Oral Oral Oral  SpO2: 100% 99% 99% 96%  Weight:   179 lb 3.7 oz (81.3 kg)   Height:        Intake/Output Summary (Last 24 hours) at 06/28/2017 0829 Last data filed at 06/28/2017 0459 Gross per 24 hour  Intake 974.68 ml  Output 1675 ml  Net -700.32 ml   Filed Weights   06/26/17 0915 06/27/17 0453 06/28/17 0449  Weight: 189 lb (85.7 kg) 167 lb 6.4 oz (75.9 kg) 179 lb 3.7 oz (81.3 kg)    Telemetry    SR  ECG      Physical Exam   GEN: No acute distress.   Neck: No JVD Cardiac: RRR, no murmurs, rubs, or gallops.  Respiratory: Clear to auscultation bilaterally. GI: Soft, nontender, non-distended  MS: No edema; No deformity. Neuro:  Nonfocal  Psych:  Normal affect   Labs    Chemistry Recent Labs  Lab 06/26/17 0747 06/26/17 1850 06/27/17 0211  NA 136  --  137  K 3.8  --  4.1  CL 107  --  107  CO2 22  --  23  GLUCOSE 133*  --  157*  BUN 11  --  12  CREATININE 1.05  --  1.08  CALCIUM 8.9  --  8.8*  PROT  --  6.3*  --   ALBUMIN  --  3.4*  --   AST  --  66*  --   ALT  --  14*  --   ALKPHOS  --  84  --   BILITOT  --  0.7  --   GFRNONAA >60  --  >60  GFRAA >60  --  >60  ANIONGAP 7  --  7     Hematology Recent Labs  Lab 06/26/17 0747 06/27/17 0211 06/28/17 0608  WBC 7.7 10.5 10.2  RBC 4.42 4.52 3.90*  HGB 12.1* 12.4* 10.4*  HCT 36.9* 37.1* 32.3*  MCV 83.5 82.1 82.8  MCH 27.4 27.4 26.7  MCHC 32.8 33.4 32.2  RDW 13.5 13.5 13.7  PLT 176  197 154    Cardiac Enzymes Recent Labs  Lab 06/26/17 1744 06/27/17 1050 06/27/17 1608 06/27/17 2239  TROPONINI 7.07* 15.58* 15.58* 15.88*    Recent Labs  Lab 06/26/17 0809  TROPIPOC 3.44*     BNPNo results for input(s): BNP, PROBNP in the last 168 hours.   DDimer No results for input(s): DDIMER in the last 168 hours.   Radiology    No results found.  Cardiac Studies    Patient Profile     Jordan Harveyis a 66 y.o.malewith a history of of CAD, [facility], + tobacco 2ppd, DM-2, HLD and disability walks with walker.Last admitted 04/25/17 with NSTEMI Pk troponin 0.72EKG with chronic RBBB and LPFB.    Assessment & Plan    1. Chest pain/CAD - [facility] [place] - admitted with chest pain. Trop up to 15.88 without clear peak. No clear EKG changes - cath yesterday as reported above. Essentially severe 3 vessel native disease with occluded RCA, LAD, LCX. 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 Occluded SVG-RCA, occluded SVG-OM2, occluded SVG-OM1.  - recs per interventional cardiolgy for medical therapy. Notes report subclavian stenosis and  ostial diag could be intervened but unclear of benefit.  - 04/2017 echo LVEF 60-65%, grade I diasotlic dysfunction - medical therapy with ASA, atorva 80, hep gtt, imdur 60, lopressor 12.5mg  bid, lisinopril 2.5 mg daily - 06/2017 echo LVEF 50%, inferolateral hypokinesis - follow clinical course over the weekend, may need to rediscuss with interventional cardiology pending progress - significant chest pain Friday night, no recurrent symptoms since.   - continue heparin drip today, would like to see troponin peak and remain pain free prior to stopping.    2. PAD/Critical limb ischemia - high risk for vascualr surgery based on cath and NSTEMI    For questions or updates, please contact [facility] Please consult [email or web address] for contact info under Cardiology/STEMI.      Joanie CoddingtonSigned, Symphonie Schneiderman, MD  06/28/2017, 8:29 AM

## 2017-06-28 NOTE — Progress Notes (Signed)
Tech offered Pt a bath. Pt stated no. Tech asked Pt when was the last time he had bath. Pt stated last night. Tech notified RN regarding Pt's statement. RN spoke with Pt, and Pt still continued to refused.Finally Pt has agreed to getting a bath. Tech will give Pt a bath around 11:30 am.

## 2017-06-28 NOTE — Progress Notes (Signed)
Pt was getting out of the bed and in chair this morning, Oxygen weaned to room air and now saturation is maintained to 94%, tylenol given for a complain of headache, will continue to monitor the patient

## 2017-06-28 NOTE — Progress Notes (Signed)
IV heparin continue @13cc /hr, no any complain of SOB and pain, vitals stable, will continue to monitor

## 2017-06-28 NOTE — Progress Notes (Signed)
ANTICOAGULATION CONSULT NOTE - Follow Up Consult  Pharmacy Consult for Heparin  Indication: chest pain/ACS  No Known Allergies  Patient Measurements: Height: 6\' 1"  (185.4 cm) Weight: 179 lb 3.7 oz (81.3 kg) IBW/kg (Calculated) : 79.9  Vital Signs: Temp: 98.7 F (37.1 C) (11/11 0738) Temp Source: Oral (11/11 0738) BP: 151/75 (11/11 0738) Pulse Rate: 82 (11/11 0738)  Labs: Recent Labs    06/26/17 0747  06/26/17 1850  06/27/17 0211 06/27/17 1050 06/27/17 1608 06/27/17 2239 06/28/17 0608  HGB 12.1*  --   --   --  12.4*  --   --   --  10.4*  HCT 36.9*  --   --   --  37.1*  --   --   --  32.3*  PLT 176  --   --   --  197  --   --   --  154  LABPROT 14.6  --  14.5  --   --   --   --   --   --   INR 1.15  --  1.14  --   --   --   --   --   --   HEPARINUNFRC  --   --   --    < > 0.19* 0.46 0.32  --  0.50  CREATININE 1.05  --   --   --  1.08  --   --   --   --   TROPONINI  --    < >  --   --   --  15.58* 15.58* 15.88*  --    < > = values in this interval not displayed.    Estimated Creatinine Clearance: 77.1 mL/min (by C-G formula based on SCr of 1.08 mg/dL).    Assessment: 66 yo male with NSTEMI s/p cath. Heparin continues for recurrent CP. He is also noted with PAD but per notes high surgical risk -Heparin level =0.5, remains therapeutic on heparin drip 1300 units/hr.   Goal of Therapy:  Heparin level 0.3-0.7 units/ml Monitor platelets by anticoagulation protocol: Yes   Plan:  -No heparin changes needed, continue Heparin IV at 1300 units/hr -Daily CBC and heparin level -Noted plans to continue heparin per cardiology  Harland GermanAndrew Destiney Sanabia, Pharm D 06/28/2017 10:40 AM

## 2017-06-29 ENCOUNTER — Encounter (HOSPITAL_COMMUNITY): Payer: Self-pay | Admitting: Cardiovascular Disease

## 2017-06-29 DIAGNOSIS — I25810 Atherosclerosis of coronary artery bypass graft(s) without angina pectoris: Secondary | ICD-10-CM

## 2017-06-29 DIAGNOSIS — I998 Other disorder of circulatory system: Secondary | ICD-10-CM

## 2017-06-29 LAB — BASIC METABOLIC PANEL
Anion gap: 8 (ref 5–15)
BUN: 14 mg/dL (ref 6–20)
CALCIUM: 8.1 mg/dL — AB (ref 8.9–10.3)
CO2: 20 mmol/L — ABNORMAL LOW (ref 22–32)
CREATININE: 1.16 mg/dL (ref 0.61–1.24)
Chloride: 106 mmol/L (ref 101–111)
GFR calc Af Amer: >60 mL/min (ref >60)
Glucose, Bld: 169 mg/dL — ABNORMAL HIGH (ref 65–99)
POTASSIUM: 4.2 mmol/L (ref 3.5–5.1)
SODIUM: 134 mmol/L — AB (ref 135–145)

## 2017-06-29 LAB — GLUCOSE, CAPILLARY
GLUCOSE-CAPILLARY: 189 mg/dL — AB (ref 65–99)
Glucose-Capillary: 169 mg/dL — ABNORMAL HIGH (ref 65–99)
Glucose-Capillary: 283 mg/dL — ABNORMAL HIGH (ref 65–99)
Glucose-Capillary: 119 mg/dL — ABNORMAL HIGH (ref 65–99)

## 2017-06-29 LAB — TROPONIN I: TROPONIN I: 10.34 ng/mL — AB (ref <0.03)

## 2017-06-29 LAB — CBC
HCT: 32.5 % — ABNORMAL LOW (ref 39.0–52.0)
Hemoglobin: 10.7 g/dL — ABNORMAL LOW (ref 13.0–17.0)
MCH: 27.2 pg (ref 26.0–34.0)
MCHC: 32.9 g/dL (ref 30.0–36.0)
MCV: 82.7 fL (ref 78.0–100.0)
PLATELETS: 135 10*3/uL — AB (ref 150–400)
RBC: 3.93 MIL/uL — AB (ref 4.22–5.81)
RDW: 13.6 % (ref 11.5–15.5)
WBC: 10.2 10*3/uL (ref 4.0–10.5)

## 2017-06-29 LAB — HEPARIN LEVEL (UNFRACTIONATED): Heparin Unfractionated: 0.39 IU/mL (ref 0.30–0.70)

## 2017-06-29 MED ORDER — METOPROLOL TARTRATE 25 MG PO TABS
25.0000 mg | ORAL_TABLET | Freq: Two times a day (BID) | ORAL | Status: DC
  Administered 2017-06-29 (×2): 25 mg via ORAL
  Filled 2017-06-29 (×2): qty 1

## 2017-06-29 NOTE — Plan of Care (Signed)
  Health Behavior/Discharge Planning: Ability to manage health-related needs will improve 06/29/2017 0802 - Completed/Met by Rush Farmer Sophronia Simas, RN   Completed/Met Health Behavior/Discharge Planning: Ability to manage health-related needs will improve 06/29/2017 0802 - Completed/Met by Evert Kohl, RN Clinical Measurements: Ability to maintain clinical measurements within normal limits will improve 06/29/2017 0802 - Completed/Met by Evert Kohl, RN Will remain free from infection 06/29/2017 0802 - Completed/Met by Evert Kohl, RN Diagnostic test results will improve 06/29/2017 0802 - Completed/Met by Evert Kohl, RN Respiratory complications will improve 06/29/2017 0802 - Completed/Met by Evert Kohl, RN Cardiovascular complication will be avoided 06/29/2017 0802 - Completed/Met by Evert Kohl, RN Activity: Risk for activity intolerance will decrease 06/29/2017 0802 - Completed/Met by Evert Kohl, RN Coping: Level of anxiety will decrease 06/29/2017 0802 - Completed/Met by Evert Kohl, RN Elimination: Will not experience complications related to bowel motility 06/29/2017 0802 - Completed/Met by Evert Kohl, RN Will not experience complications related to urinary retention 06/29/2017 0802 - Completed/Met by Evert Kohl, RN Safety: Ability to remain free from injury will improve 06/29/2017 0802 - Completed/Met by Evert Kohl, RN Skin Integrity: Risk for impaired skin integrity will decrease 06/29/2017 0802 - Completed/Met by Evert Kohl, RN

## 2017-06-29 NOTE — Progress Notes (Signed)
The patient has been seen in conjunction with Eulah PontNina Blum, MD. All aspects of care have been considered and discussed. The patient has been personally interviewed, examined, and all clinical data has been reviewed.   The patient has bypass graft failure including occlusion of the SVG to RCA, SVG to diagonal, SVG to circumflex, and patent LIMA to LAD but with significant proximal to mid left subclavian stenosis.  He is a set up for subclavian steal and also anterior wall ischemia.  The other and perhaps more critical issue at this time is bilateral critical limb ischemia involving the left and right leg.  He is elevated risk for vascular surgery.  Perhaps he would fare better if he underwent left subclavian stenting prior to the planned aortobifem bypass.  Plan to discuss with Dr. Edilia Boickson.  The patient is followed at [facility] in [place].  We will need to coordinate care with reference to PAD and CAD between institutions.   Progress Note  Patient Name: Jordan Gardner Date of Encounter: 06/29/2017  Primary Cardiologist: [facility]  Subjective   Reports left shoulder pain which worsens and moves down to his chest with deep breathing. Says that he would like to go home. No other concerns.   Inpatient Medications    Scheduled Meds: . aspirin EC  81 mg Oral Daily  . atorvastatin  80 mg Oral q1800  . cilostazol  100 mg Oral BID  . feeding supplement (GLUCERNA SHAKE)  237 mL Oral BID BM  . Influenza vac split quadrivalent PF  0.5 mL Intramuscular Tomorrow-1000  . insulin aspart  0-5 Units Subcutaneous QHS  . insulin aspart  0-9 Units Subcutaneous TID WC  . isosorbide mononitrate  60 mg Oral Daily  . lisinopril  2.5 mg Oral Daily  . metoprolol tartrate  12.5 mg Oral BID  . mirtazapine  15 mg Oral QHS  . multivitamin with minerals  1 tablet Oral Daily  . nicotine  14 mg Transdermal Daily  . pneumococcal 23 valent vaccine  0.5 mL Intramuscular  Tomorrow-1000  . sertraline  100 mg Oral Daily  . sodium chloride flush  3 mL Intravenous Q12H   Continuous Infusions: . sodium chloride    . heparin 1,300 Units/hr (06/28/17 2250)   PRN Meds: sodium chloride, acetaminophen, gi cocktail, hydrALAZINE, labetalol, morphine injection, nitroGLYCERIN, ondansetron (ZOFRAN) IV, sodium chloride flush   Vital Signs    Vitals:   06/28/17 1200 06/28/17 1222 06/28/17 1949 06/29/17 0358  BP:  107/71 (!) 108/54 138/71  Pulse:  71 84 87  Resp:  20 20 20   Temp:  97.9 F (36.6 C) 99.5 F (37.5 C) 98.7 F (37.1 C)  TempSrc:  Oral Oral Oral  SpO2: 94% 97% 96% 97%  Weight:    172 lb 14.4 oz (78.4 kg)  Height:        Intake/Output Summary (Last 24 hours) at 06/29/2017 0853 Last data filed at 06/29/2017 0830 Gross per 24 hour  Intake 1957.83 ml  Output 1075 ml  Net 882.83 ml   Filed Weights   06/27/17 0453 06/28/17 0449 06/29/17 0358  Weight: 167 lb 6.4 oz (75.9 kg) 179 lb 3.7 oz (81.3 kg) 172 lb 14.4 oz (78.4 kg)    Telemetry    Sinus rhythm, heart rate 80s - Personally Reviewed  Physical Exam   GEN: Sitting in bed comfortably watching tv. No acute distress.   Neck: No JVD Cardiac: RRR, no murmurs, rubs, or gallops.  Respiratory: Clear  to auscultation bilaterally. GI: Soft, nontender, non-distended  MS: No edema; No deformity. Neuro:  Nonfocal  Psych: Normal affect   Labs    Chemistry Recent Labs  Lab 06/26/17 1850 06/27/17 0211 06/28/17 1042 06/29/17 0425  NA  --  137 134* 134*  K  --  4.1 4.4 4.2  CL  --  107 106 106  CO2  --  23 20* 20*  GLUCOSE  --  157* 156* 169*  BUN  --  12 12 14   CREATININE  --  1.08 1.13 1.16  CALCIUM  --  8.8* 8.4* 8.1*  PROT 6.3*  --   --   --   ALBUMIN 3.4*  --   --   --   AST 66*  --   --   --   ALT 14*  --   --   --   ALKPHOS 84  --   --   --   BILITOT 0.7  --   --   --   GFRNONAA  --  >60 >60 >60  GFRAA  --  >60 >60 >60  ANIONGAP  --  7 8 8      Hematology Recent Labs  Lab  06/27/17 0211 06/28/17 0608 06/29/17 0425  WBC 10.5 10.2 10.2  RBC 4.52 3.90* 3.93*  HGB 12.4* 10.4* 10.7*  HCT 37.1* 32.3* 32.5*  MCV 82.1 82.8 82.7  MCH 27.4 26.7 27.2  MCHC 33.4 32.2 32.9  RDW 13.5 13.7 13.6  PLT 197 154 135*    Cardiac Enzymes Recent Labs  Lab 06/27/17 1608 06/27/17 2239 06/28/17 1042 06/29/17 0425  TROPONINI 15.58* 15.88* 12.06* 10.34*    Recent Labs  Lab 06/26/17 0809  TROPIPOC 3.44*     BNPNo results for input(s): BNP, PROBNP in the last 168 hours.   DDimer No results for input(s): DDIMER in the last 168 hours.   Radiology    No results found.  Cardiac Studies   Cardiac cath 06/26/2017 1. Severe 3 vessel CAD with total occlusion of the RCA, total occlusion of the mid-circumflex, and severe LAD/diagonal stenosis 2. 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 Total occlusion of all SVG's with the SVG-unknown target (probable OM1) as the patient's culprit occlusion 4. Moderate segmental LV systolic dysfunction  Complex situation in patient with critical limb ischemia, very poor functional capacity, and consideration of aorto-bifemoral bypass. He is not a candidate for acute intervention and medical therapy may be the most reasonable approach. The subclavian stenosis and ostial diagonal stenosis could be approached percutaneously but I'm not sure that would alter this patient's clinical course/prognosis. In my opinion he will be at high cardiac risk of major vascular surgery.  Patient Profile     66 y.o. male with PMH CAD (s/p CABG x5 in 2012), diastolic heart failure, occlusive aortoiliac atherosclerosis, tobacco use disorder, uncontrolled type 2 diabetes, and hyperlipidemia.  Who presented 11/9 with elevated troponin which peaked 15.58 and EKG without acute ischemic changes. 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 He is not a candidate for acute intervention and being managed with medications.   Assessment & Plan    Coronary artery disease ( CABG x5 in 2012) with cath 11/9 revealing severe  3 vessel native disease with occlusion of the RCA, LAD, and cirmumflex and subclavian stenosis proximal to the LIMA - On medication management aspirin, heparin gtt and atorvastatin 80 mg qd. Beta blocked with metoprolol tartrate 12.5 mg BID with heart rate 80s, will increase this today. Lisinopril 2.5 mg qd and isosorbide mononitrate 60 mg qd with blood pressure 138/71 this morning. Will continue to adjust medication mgmt.   Hyperlipidemia - LDL 58, on atorvastatin 80 mg qd   Type 2 DM - mgmt with insulin   For questions or updates, please contact [facility] Please consult [email or web address] for contact info under Cardiology/STEMI.      Signed, Eulah PontNina Blum, MD  06/29/2017, 8:53 AM

## 2017-06-29 NOTE — Progress Notes (Signed)
ANTICOAGULATION CONSULT NOTE - Follow Up Consult  Pharmacy Consult for Heparin  Indication: chest pain/ACS  No Known Allergies  Patient Measurements: Height: 6\' 1"  (185.4 cm) Weight: 172 lb 14.4 oz (78.4 kg)(scale a) IBW/kg (Calculated) : 79.9  Vital Signs: Temp: 98.7 F (37.1 C) (11/12 0358) Temp Source: Oral (11/12 0358) BP: 138/71 (11/12 0358) Pulse Rate: 87 (11/12 0358)  Labs: Recent Labs    06/26/17 1850  06/27/17 0211  06/27/17 1608 06/27/17 2239 06/28/17 0608 06/28/17 1042 06/29/17 0425  HGB  --    < > 12.4*  --   --   --  10.4*  --  10.7*  HCT  --   --  37.1*  --   --   --  32.3*  --  32.5*  PLT  --   --  197  --   --   --  154  --  135*  LABPROT 14.5  --   --   --   --   --   --   --   --   INR 1.14  --   --   --   --   --   --   --   --   HEPARINUNFRC  --   --  0.19*   < > 0.32  --  0.50  --  0.39  CREATININE  --   --  1.08  --   --   --   --  1.13 1.16  TROPONINI  --   --   --    < > 15.58* 15.88*  --  12.06* 10.34*   < > = values in this interval not displayed.   Estimated Creatinine Clearance: 70.4 mL/min (by C-G formula based on SCr of 1.16 mg/dL).  Assessment: 66 yo male with NSTEMI s/p cath. Heparin continues for recurrent CP. He is also noted with PAD but per notes high surgical risk.Troponin 10 this morning, as the value peaked over the weekend at ~16 now trending down   Heparin level therapeutic: 0.39, CBC stable, Hgb 10.7, PLT 135, no overt bleeding documented   Goal of Therapy:  Heparin level 0.3-0.7 units/ml Monitor platelets by anticoagulation protocol: Yes   Plan:  -Continue heparin gtt at 1300 units/hr -Daily CBC and heparin level -Noted plans to continue heparin per cardiology  Ruben Imony Taj Nevins, PharmD Clinical Pharmacist 06/29/2017 8:52 AM

## 2017-06-30 DIAGNOSIS — I25118 Atherosclerotic heart disease of native coronary artery with other forms of angina pectoris: Secondary | ICD-10-CM

## 2017-06-30 LAB — BASIC METABOLIC PANEL
Anion gap: 7 (ref 5–15)
BUN: 13 mg/dL (ref 6–20)
CHLORIDE: 105 mmol/L (ref 101–111)
CO2: 20 mmol/L — ABNORMAL LOW (ref 22–32)
CREATININE: 1.25 mg/dL — AB (ref 0.61–1.24)
Calcium: 8.1 mg/dL — ABNORMAL LOW (ref 8.9–10.3)
GFR, EST NON AFRICAN AMERICAN: 59 mL/min — AB (ref >60)
Glucose, Bld: 248 mg/dL — ABNORMAL HIGH (ref 65–99)
Potassium: 4.3 mmol/L (ref 3.5–5.1)
SODIUM: 132 mmol/L — AB (ref 135–145)

## 2017-06-30 LAB — CBC
HCT: 31.4 % — ABNORMAL LOW (ref 39.0–52.0)
HEMOGLOBIN: 10.4 g/dL — AB (ref 13.0–17.0)
MCH: 27.5 pg (ref 26.0–34.0)
MCHC: 33.1 g/dL (ref 30.0–36.0)
MCV: 83.1 fL (ref 78.0–100.0)
Platelets: 150 10*3/uL (ref 150–400)
RBC: 3.78 MIL/uL — AB (ref 4.22–5.81)
RDW: 14.1 % (ref 11.5–15.5)
WBC: 8.3 10*3/uL (ref 4.0–10.5)

## 2017-06-30 LAB — GLUCOSE, CAPILLARY
GLUCOSE-CAPILLARY: 194 mg/dL — AB (ref 65–99)
Glucose-Capillary: 262 mg/dL — ABNORMAL HIGH (ref 65–99)
Glucose-Capillary: 123 mg/dL — ABNORMAL HIGH (ref 65–99)
Glucose-Capillary: 116 mg/dL — ABNORMAL HIGH (ref 65–99)

## 2017-06-30 LAB — HEPARIN LEVEL (UNFRACTIONATED): HEPARIN UNFRACTIONATED: 0.4 [IU]/mL (ref 0.30–0.70)

## 2017-06-30 MED ORDER — METOPROLOL TARTRATE 50 MG PO TABS
50.0000 mg | ORAL_TABLET | Freq: Two times a day (BID) | ORAL | Status: DC
  Administered 2017-06-30 – 2017-07-01 (×3): 50 mg via ORAL
  Filled 2017-06-30 (×3): qty 1

## 2017-06-30 MED ORDER — INSULIN ASPART 100 UNIT/ML ~~LOC~~ SOLN: 0.0000 [IU] | Freq: Every day | SUBCUTANEOUS | Status: DC

## 2017-06-30 MED ORDER — INSULIN GLARGINE 100 UNIT/ML ~~LOC~~ SOLN
8.0000 [IU] | Freq: Every day | SUBCUTANEOUS | Status: DC
  Administered 2017-06-30 – 2017-07-01 (×2): 8 [IU] via SUBCUTANEOUS
  Filled 2017-06-30 (×2): qty 0.08

## 2017-06-30 MED ORDER — INSULIN ASPART 100 UNIT/ML ~~LOC~~ SOLN
0.0000 [IU] | Freq: Three times a day (TID) | SUBCUTANEOUS | Status: DC
  Administered 2017-06-30: 8 [IU] via SUBCUTANEOUS
  Administered 2017-06-30: 3 [IU] via SUBCUTANEOUS
  Administered 2017-07-01: 2 [IU] via SUBCUTANEOUS
  Administered 2017-07-01: 5 [IU] via SUBCUTANEOUS

## 2017-06-30 NOTE — Consult Note (Addendum)
Patient name: Jordan Gardner MRN: [id number] DOB: 01/15/1951 Sex: male   REASON FOR CONSULT:    Left subclavian artery stenosis.  Consult was requested by Dr. Verdis PrimeHenry Smith.  HPI:   Jordan Gardner is a pleasant 66 y.o. male, who I saw in consultation on 05/27/2017.  He told me at that time that his legs have been hurting for about a year.  He had developed some rest pain in both feet at night.  Her symptoms have gotten worse over the last several months.  He had a small wound on his left third toe.  In reviewing his records, he had a non-ST MI in September of this year.  [place] [facility] [place] [facility] [place].  Reportedly, he refused catheterization at that time and was to [facility].  He was admitted on 06/26/2017 with chest pain.  This patient underwent left heart cath 06/26/2017.  This showed severe three-vessel coronary artery disease occlusion of the right coronary artery, total occlusion of the mid circumflex, severe LAD diagonal disease.  There was a patent LIMA to LAD graft but a stenosis in the left subclavian artery proximal to the LIMA.  There was total occlusion of all saphenous vein grafts.  The patient had moderate segmental LV systolic dysfunction.  Currently the patient is not having chest pain.  He continues to have bilateral lower extremity claudication and early rest pain.  He denies symptoms in his left arm.  Past Medical History:  Diagnosis Date  . Coronary artery disease   . Diabetes mellitus without complication (HCC)   . Hyperlipidemia   . Hypertension   . PAD (peripheral artery disease) (HCC)   . Protein calorie malnutrition (HCC) 04/2017  . Psychotic affective disorder (HCC)    Per sister. Unsure of Dx. Pt sleeps late and difficult to wake. odd affect  . Sacral decubitus ulcer 04/2017    Family History  Problem Relation Age of Onset  . Hypertension Sister     SOCIAL  HISTORY: Social History   Socioeconomic History  . Marital status: Single    Spouse name: Not on file  . Number of children: Not on file  . Years of education: Not on file  . Highest education level: Not on file  Social Needs  . Financial resource strain: Not on file  . Food insecurity - worry: Not on file  . Food insecurity - inability: Not on file  . Transportation needs - medical: Not on file  . Transportation needs - non-medical: Not on file  Occupational History  . Not on file  Tobacco Use  . Smoking status: Current Every Day Smoker    Packs/day: 1.00    Types: Cigarettes  . Smokeless tobacco: Never Used  Substance and Sexual Activity  . Alcohol use: No  . Drug use: No  . Sexual activity: Not on file  Other Topics Concern  . Not on file  Social History Narrative  . Not on file    No Known Allergies  Current Facility-Administered Medications  Medication Dose Route Frequency Provider Last Rate Last Dose  . 0.9 %  sodium chloride infusion  250 mL Intravenous PRN Tonny Bollmanooper, Michael, MD      . acetaminophen (TYLENOL) tablet 650 mg  650 mg Oral Q4H PRN Tonny Bollmanooper, Michael, MD   650 mg at 06/30/17 0517  . aspirin EC tablet 81 mg  81 mg Oral Daily Leone BrandIngold, Laura R, NP  81 mg at 06/30/17 0951  . atorvastatin (LIPITOR) tablet 80 mg  80 mg Oral q1800 Leone Brand, NP   80 mg at 06/29/17 1732  . cilostazol (PLETAL) tablet 100 mg  100 mg Oral BID Leone Brand, NP   100 mg at 06/29/17 2118  . feeding supplement (GLUCERNA SHAKE) (GLUCERNA SHAKE) liquid 237 mL  237 mL Oral BID BM Leone Brand, NP   237 mL at 06/29/17 1044  . gi cocktail (Maalox,Lidocaine,Donnatal)  30 mL Oral TID PRN Lennette Bihari, MD   30 mL at 06/28/17 1247  . heparin ADULT infusion 100 units/mL (25000 units/245mL sodium chloride 0.45%)  1,300 Units/hr Intravenous Continuous Stevphen Rochester, RPH 13 mL/hr at 06/28/17 2250 1,300 Units/hr at 06/28/17 2250  . hydrALAZINE (APRESOLINE) injection 20 mg  20 mg  Intravenous Q4H PRN Tonny Bollman, MD   20 mg at 06/26/17 1611  . insulin aspart (novoLOG) injection 0-15 Units  0-15 Units Subcutaneous TID WC Eulah Pont, MD      . insulin aspart (novoLOG) injection 0-5 Units  0-5 Units Subcutaneous QHS Eulah Pont, MD      . insulin glargine (LANTUS) injection 8 Units  8 Units Subcutaneous Daily Eulah Pont, MD   8 Units at 06/30/17 0950  . isosorbide mononitrate (IMDUR) 24 hr tablet 60 mg  60 mg Oral Daily [place], Luis M. Cintron, [place]   60 mg at 06/30/17 0951  . labetalol (NORMODYNE,TRANDATE) injection 20 mg  20 mg Intravenous Q1H PRN Tonny Bollman, MD   20 mg at 06/26/17 2104  . lisinopril (PRINIVIL,ZESTRIL) tablet 2.5 mg  2.5 mg Oral Daily Antoine Poche, MD   2.5 mg at 06/30/17 0951  . metoprolol tartrate (LOPRESSOR) tablet 50 mg  50 mg Oral BID Eulah Pont, MD   50 mg at 06/30/17 0951  . mirtazapine (REMERON) tablet 15 mg  15 mg Oral QHS Leone Brand, NP   15 mg at 06/29/17 2118  . morphine 2 MG/ML injection 2 mg  2 mg Intravenous Q2H PRN Azalee Course, PA   2 mg at 06/26/17 1926  . multivitamin with minerals tablet 1 tablet  1 tablet Oral Daily Leone Brand, NP   1 tablet at 06/30/17 0951  . nicotine (NICODERM CQ - dosed in mg/24 hours) patch 14 mg  14 mg Transdermal Daily Leone Brand, NP   14 mg at 06/30/17 0955  . nitroGLYCERIN (NITROSTAT) SL tablet 0.4 mg  0.4 mg Sublingual Q5 Min x 3 PRN Leone Brand, NP   0.4 mg at 06/26/17 1828  . ondansetron (ZOFRAN) injection 4 mg  4 mg Intravenous Q6H PRN Tonny Bollman, MD      . sertraline (ZOLOFT) tablet 100 mg  100 mg Oral Daily Leone Brand, NP   100 mg at 06/30/17 0951  . sodium chloride flush (NS) 0.9 % injection 3 mL  3 mL Intravenous Q12H Tonny Bollman, MD   3 mL at 06/28/17 2232  . sodium chloride flush (NS) 0.9 % injection 3 mL  3 mL Intravenous PRN Tonny Bollman, MD        REVIEW OF SYSTEMS:  [X]  denotes positive finding, [ ]  denotes negative finding Cardiac  Comments:  Chest pain or chest  pressure: X   Shortness of breath upon exertion: X   Short of breath when lying flat:    Irregular heart rhythm:        Vascular    Pain in calf, thigh, or hip  brought on by ambulation: X   Pain in feet at night that wakes you up from your sleep:  X   Blood clot in your veins:    Leg swelling:         Pulmonary    Oxygen at home:    Productive cough:     Wheezing:         Neurologic    Sudden weakness in arms or legs:     Sudden numbness in arms or legs:     Sudden onset of difficulty speaking or slurred speech:    Temporary loss of vision in one eye:     Problems with dizziness:         Gastrointestinal    Blood in stool:     Vomited blood:         Genitourinary    Burning when urinating:     Blood in urine:        Psychiatric    Major depression:         Hematologic    Bleeding problems:    Problems with blood clotting too easily:        Skin    Rashes or ulcers:        Constitutional    Fever or chills:     PHYSICAL EXAM:   Vitals:   06/29/17 2055 06/29/17 2120 06/30/17 0626 06/30/17 1106  BP: (!) 119/55  122/67 140/73  Pulse: 83  (!) 109 77  Resp: 16   18  Temp: (!) 100.4 F (38 C) 99 F (37.2 C) 98.9 F (37.2 C) 98.3 F (36.8 C)  TempSrc: Oral Oral Oral Oral  SpO2: 97%  100% 100%  Weight:   174 lb 14.4 oz (79.3 kg)   Height:       GENERAL: The patient is a poorly nourished male, in no acute distress. The vital signs are documented above. CARDIAC: There is a regular rate and rhythm.  VASCULAR: I do not detect carotid bruits. He has slightly diminished but palpable femoral pulses bilaterally.   I cannot palpate popliteal or pedal pulses on either side. He has no significant lower extremity swelling. PULMONARY: There is good air exchange bilaterally without wheezing or rales. ABDOMEN: Soft and non-tender with normal pitched bowel sounds.  MUSCULOSKELETAL: There are no major deformities or cyanosis. NEUROLOGIC: No focal weakness or paresthesias  are detected. SKIN: The patient has some slight discoloration on his left third toe. PSYCHIATRIC: The patient has a normal affect.  DATA:    ARTERIAL DOPPLER STUDY: I reviewed the arterial Doppler study that was done on 04/28/2017. This showed monophasic Doppler signals in the dorsalis pedis and posterior tibial positions bilaterally. ABI on the right was 53%. ABI on the left was 47%.  CT ANGIOGRAM CHEST ABDOMEN AND PELVIS: I reviewed his CT angiogram that was done on 04/25/2017. 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 There was a high-grade stenosis of the celiac axis and a moderate stenosis of both renal arteries. The superior mesenteric artery and inferior mesenteric artery were patent. Overall he has diffuse multilevel arterial occlusive disease.  ARTERIOGRAM: The patient underwent an arteriogram on 06/08/2017.  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  There was diffuse aortoiliac occlusive disease and bilateral hypogastric artery occlusions.  There was a greater than 50% infrarenal aortic stenosis.  On the left side there was diffuse disease in the common iliac artery, external iliac  artery.  Common femoral artery was patent with a tight stenosis in the proximal deep femoral artery.  Superficial femoral artery was occluded at its origin with reconstitution of the popliteal artery at the level of the knee.  Two-vessel runoff on the left via the anterior tibial and posterior tibial arteries which had mild diffuse disease.  On the right side, there was diffuse disease in the common iliac artery and external iliac artery.  Was also significant plaque in the common femoral artery.  Occluded at its origin.  The deep femoral artery was patent.  There was reconstitution of the popliteal artery at the level knee with single-vessel runoff on the right via the posterior tibial artery.   MEDICAL ISSUES:   LEFT SUBCLAVIAN ARTERY STENOSIS:  Patient has a moderate left subclavian artery stenosis proximal to the IMA graft.  I would agree that this will need to be addressed patient has been symptomatic cardiac standpoint.  The risk with this is significant however and this would need to be done in the operating room.  I would place a balloon expandable covered stent so that the subclavian artery would only be occluded briefly.  The earliest that we can schedule this with me next week.  Therefore, from my standpoint he can be discharged and we can arrange for this as an outpatient.  MULTILEVEL ARTERIAL OCCLUSIVE DISEASE: The patient does have multilevel arterial occlusive disease however the wound on the third toe is stable.  I think once that he has recovered dressing the left subclavian artery stenosis we could consider whether or not he was a candidate for an right axillobifemoral bypass and left femoropopliteal bypass.  This would be associated with significant risk given his malnutrition, cardiac history, and debilitated state.  Fortunately, at this point the wound on the foot is stable.  The alternative if this progressed would be primary amputation which would not be unreasonable given the overall clinical picture.  I agree with Dr. Earmon Phoenix assessment that the patient has very poor functional capacity and would be at high risk from a cardiac standpoint for any major vascular surgery.    Waverly Ferrari [facility] [phone]

## 2017-06-30 NOTE — Progress Notes (Signed)
The patient has been seen in conjunction with Jordan PontNina Blum, MD. All aspects of care have been considered and discussed. The patient has been personally interviewed, examined, and all clinical data has been reviewed.   Spoke to Dr. Edilia Boickson.  Agree that bilateral lower extremity vascular surgery would be difficult and high risk in this patient with significant ischemic heart disease and debilitation.  He does have option for left subclavian stent implantation but will need to be done in the OR when appropriate room is available.  We will plan to optimize medical therapy and discharge within the next 24 hours and have returned for the  interventional procedure.  Start Plavix.   Progress Note  Patient Name: Jordan Gardner Date of Encounter: 06/30/2017  [facility]  Subjective   Denies chest pain or shortness of breath today. Continues to have left shoulder pain, this began a week ago, the pain is in the area above his scapula is made worse with deep breathing, not reproducible with palpation.   Inpatient Medications    Scheduled Meds: . aspirin EC  81 mg Oral Daily  . atorvastatin  80 mg Oral q1800  . cilostazol  100 mg Oral BID  . feeding supplement (GLUCERNA SHAKE)  237 mL Oral BID BM  . insulin aspart  0-5 Units Subcutaneous QHS  . insulin aspart  0-9 Units Subcutaneous TID WC  . isosorbide mononitrate  60 mg Oral Daily  . lisinopril  2.5 mg Oral Daily  . metoprolol tartrate  25 mg Oral BID  . mirtazapine  15 mg Oral QHS  . multivitamin with minerals  1 tablet Oral Daily  . nicotine  14 mg Transdermal Daily  . sertraline  100 mg Oral Daily  . sodium chloride flush  3 mL Intravenous Q12H   Continuous Infusions: . sodium chloride    . heparin 1,300 Units/hr (06/28/17 2250)   PRN Meds: sodium chloride, acetaminophen, gi cocktail, hydrALAZINE, labetalol, morphine injection, nitroGLYCERIN, ondansetron (ZOFRAN) IV, sodium chloride  flush   Vital Signs    Vitals:   06/29/17 1047 06/29/17 2055 06/29/17 2120 06/30/17 0626  BP: 137/78 (!) 119/55  122/67  Pulse: 82 83  (!) 109  Resp:  16    Temp:  (!) 100.4 F (38 C) 99 F (37.2 C) 98.9 F (37.2 C)  TempSrc:  Oral Oral Oral  SpO2:  97%  100%  Weight:    174 lb 14.4 oz (79.3 kg)  Height:        Intake/Output Summary (Last 24 hours) at 06/30/2017 0723 Last data filed at 06/30/2017 16100613 Gross per 24 hour  Intake 747.99 ml  Output 1375 ml  Net -627.01 ml   Filed Weights   06/28/17 0449 06/29/17 0358 06/30/17 0626  Weight: 179 lb 3.7 oz (81.3 kg) 172 lb 14.4 oz (78.4 kg) 174 lb 14.4 oz (79.3 kg)    Telemetry    Sinus rhythm, rates 60-80s - Personally Reviewed  Physical Exam   GEN: Sitting up in bed watching television, No acute distress.   Neck: No JVD Cardiac: RRR, S3   Respiratory: Clear to auscultation bilaterally. GI: Soft, nontender, non-distended  MS: No edema; no tenderness to palpation over the left shoulder or with rotator cuff range of motion testing.  Neuro:  Nonfocal  Psych: Normal affect   Labs    Chemistry Recent Labs  Lab 06/26/17 1850  06/28/17 1042 06/29/17 0425 06/30/17 0424  NA  --    < >  134* 134* 132*  K  --    < > 4.4 4.2 4.3  CL  --    < > 106 106 105  CO2  --    < > 20* 20* 20*  GLUCOSE  --    < > 156* 169* 248*  BUN  --    < > 12 14 13   CREATININE  --    < > 1.13 1.16 1.25*  CALCIUM  --    < > 8.4* 8.1* 8.1*  PROT 6.3*  --   --   --   --   ALBUMIN 3.4*  --   --   --   --   AST 66*  --   --   --   --   ALT 14*  --   --   --   --   ALKPHOS 84  --   --   --   --   BILITOT 0.7  --   --   --   --   GFRNONAA  --    < > >60 >60 59*  GFRAA  --    < > >60 >60 >60  ANIONGAP  --    < > 8 8 7    < > = values in this interval not displayed.     Hematology Recent Labs  Lab 06/28/17 0608 06/29/17 0425 06/30/17 0424  WBC 10.2 10.2 8.3  RBC 3.90* 3.93* 3.78*  HGB 10.4* 10.7* 10.4*  HCT 32.3* 32.5* 31.4*  MCV 82.8  82.7 83.1  MCH 26.7 27.2 27.5  MCHC 32.2 32.9 33.1  RDW 13.7 13.6 14.1  PLT 154 135* 150    Cardiac Enzymes Recent Labs  Lab 06/27/17 1608 06/27/17 2239 06/28/17 1042 06/29/17 0425  TROPONINI 15.58* 15.88* 12.06* 10.34*    Recent Labs  Lab 06/26/17 0809  TROPIPOC 3.44*     BNPNo results for input(s): BNP, PROBNP in the last 168 hours.   DDimer No results for input(s): DDIMER in the last 168 hours.   Radiology    No results found.  Cardiac Studies   Cardiac cath 06/26/2017 1. Severe 3 vessel CAD with total occlusion of the RCA, total occlusion of the mid-circumflex, and severe LAD/diagonal stenosis 2. 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 Total occlusion of all SVG's with the SVG-unknown target (probable OM1) as the patient's culprit occlusion 4. Moderate segmental LV systolic dysfunction  Complex situation in patient with critical limb ischemia, very poor functional capacity, and consideration of aorto-bifemoral bypass. He is not a candidate for acute intervention and medical therapy may be the most reasonable approach. The subclavian stenosis and ostial diagonal stenosis could be approached percutaneously but I'm not sure that would alter this patient's clinical course/prognosis. In my opinion he will be at high cardiac risk of major vascular surgery.  Patient Profile     66 y.o. male with PMH CAD (s/p CABG x5 in 2012), diastolic heart failure, occlusive aortoiliac atherosclerosis, tobacco use disorder, uncontrolled type 2 diabetes, and hyperlipidemia.  Who presented 11/9 with elevated troponin which peaked 15.58 and EKG without acute ischemic changes. Was taken for cath 11/9 which revealed severe 3 vessel disease with total occlusion of the RCA and mid- circumflex and severe LAD and diagonal stenosis, severe stenosis of the left subclavian artery proximal to the LIMA and total occlusion of all  SVG. He is not a candidate for acute intervention and  being managed with medications.   Assessment & Plan    Coronary artery disease ( CABG x5 in 2012) with cath 11/9 revealing severe 3 vessel native disease and subclavian stenosis proximal to the LIMA - On medication management aspirin, heparin gtt, atorvastatin 80 mg qd, Lisinopril 2.5 mg qd and isosorbide mononitrate 60 mg qd. Beta blocked with metoprolol tartrate 25 mg BID with heart rate 100, will increase this today.  Will continue to adjust medication mgmt. He is in need of stenting of his left subclavian artery. He is a patient at [facility] so we are working to coordinate care.   Critical limb ischemia of bilateral legs - In need of revascularization procedure, plan for this is not clear at this time, will need to coordinate with his physicians at the [facility]. Spoke with vascular surgery this morning they have evaluated and feel that he is not a candidate for open aortofemoral bypass, he would be at great risk for this procedure given his advanced coronary artery disease. They do feel that the subclavian stenosis would be amendable to PCI.   Left shoulder pain - Description sounds like the pain is pleuritic in nature, question if this may be related to left subclavian stenosis.   Hyperlipidemia - LDL 58, on atorvastatin 80 mg qd   Type 2 DM - blood glucose uncontrolled, added lantus and increased sliding scale   For questions or updates, please contact [facility] Please consult [email or web address] for contact info under Cardiology/STEMI.      Signed, Jordan Pont, MD  06/30/2017, 7:23 AM

## 2017-06-30 NOTE — H&P (View-Only) (Signed)
Patient name: Jordan Gardner MRN: [id number] DOB: 01/15/1951 Sex: male   REASON FOR CONSULT:    Left subclavian artery stenosis.  Consult was requested by Dr. Verdis PrimeHenry Smith.  HPI:   Jordan Gardner is a pleasant 66 y.o. male, who I saw in consultation on 05/27/2017.  He told me at that time that his legs have been hurting for about a year.  He had developed some rest pain in both feet at night.  Her symptoms have gotten worse over the last several months.  He had a small wound on his left third toe.  In reviewing his records, he had a non-ST MI in September of this year.  [facility] [place] [facility] [place] [facility] [place]  Reportedly, he refused catheterization at that time and [facility]  He was admitted on 06/26/2017 with chest pain.  This patient underwent left heart cath 06/26/2017.  This showed severe three-vessel coronary artery disease occlusion of the right coronary artery, total occlusion of the mid circumflex, severe LAD diagonal disease.  There was a patent LIMA to LAD graft but a stenosis in the left subclavian artery proximal to the LIMA.  There was total occlusion of all saphenous vein grafts.  The patient had moderate segmental LV systolic dysfunction.  Currently the patient is not having chest pain.  He continues to have bilateral lower extremity claudication and early rest pain.  He denies symptoms in his left arm.  Past Medical History:  Diagnosis Date  . Coronary artery disease   . Diabetes mellitus without complication (HCC)   . Hyperlipidemia   . Hypertension   . PAD (peripheral artery disease) (HCC)   . Protein calorie malnutrition (HCC) 04/2017  . Psychotic affective disorder (HCC)    Per sister. Unsure of Dx. Pt sleeps late and difficult to wake. odd affect  . Sacral decubitus ulcer 04/2017    Family History  Problem Relation Age of Onset  . Hypertension Sister     SOCIAL  HISTORY: Social History   Socioeconomic History  . Marital status: Single    Spouse name: Not on file  . Number of children: Not on file  . Years of education: Not on file  . Highest education level: Not on file  Social Needs  . Financial resource strain: Not on file  . Food insecurity - worry: Not on file  . Food insecurity - inability: Not on file  . Transportation needs - medical: Not on file  . Transportation needs - non-medical: Not on file  Occupational History  . Not on file  Tobacco Use  . Smoking status: Current Every Day Smoker    Packs/day: 1.00    Types: Cigarettes  . Smokeless tobacco: Never Used  Substance and Sexual Activity  . Alcohol use: No  . Drug use: No  . Sexual activity: Not on file  Other Topics Concern  . Not on file  Social History Narrative  . Not on file    No Known Allergies  Current Facility-Administered Medications  Medication Dose Route Frequency Provider Last Rate Last Dose  . 0.9 %  sodium chloride infusion  250 mL Intravenous PRN Tonny Bollmanooper, Michael, MD      . acetaminophen (TYLENOL) tablet 650 mg  650 mg Oral Q4H PRN Tonny Bollmanooper, Michael, MD   650 mg at 06/30/17 0517  . aspirin EC tablet 81 mg  81 mg Oral Daily Leone BrandIngold, Laura R, NP  81 mg at 06/30/17 0951  . atorvastatin (LIPITOR) tablet 80 mg  80 mg Oral q1800 Leone Brand, NP   80 mg at 06/29/17 1732  . cilostazol (PLETAL) tablet 100 mg  100 mg Oral BID Leone Brand, NP   100 mg at 06/29/17 2118  . feeding supplement (GLUCERNA SHAKE) (GLUCERNA SHAKE) liquid 237 mL  237 mL Oral BID BM Leone Brand, NP   237 mL at 06/29/17 1044  . gi cocktail (Maalox,Lidocaine,Donnatal)  30 mL Oral TID PRN Lennette Bihari, MD   30 mL at 06/28/17 1247  . heparin ADULT infusion 100 units/mL (25000 units/245mL sodium chloride 0.45%)  1,300 Units/hr Intravenous Continuous Stevphen Rochester, RPH 13 mL/hr at 06/28/17 2250 1,300 Units/hr at 06/28/17 2250  . hydrALAZINE (APRESOLINE) injection 20 mg  20 mg  Intravenous Q4H PRN Tonny Bollman, MD   20 mg at 06/26/17 1611  . insulin aspart (novoLOG) injection 0-15 Units  0-15 Units Subcutaneous TID WC Eulah Pont, MD      . insulin aspart (novoLOG) injection 0-5 Units  0-5 Units Subcutaneous QHS Eulah Pont, MD      . insulin glargine (LANTUS) injection 8 Units  8 Units Subcutaneous Daily Eulah Pont, MD   8 Units at 06/30/17 0950  . isosorbide mononitrate (IMDUR) 24 hr tablet 60 mg  60 mg Oral Daily [place], Luis M. Cintron, [place]   60 mg at 06/30/17 0951  . labetalol (NORMODYNE,TRANDATE) injection 20 mg  20 mg Intravenous Q1H PRN Tonny Bollman, MD   20 mg at 06/26/17 2104  . lisinopril (PRINIVIL,ZESTRIL) tablet 2.5 mg  2.5 mg Oral Daily Antoine Poche, MD   2.5 mg at 06/30/17 0951  . metoprolol tartrate (LOPRESSOR) tablet 50 mg  50 mg Oral BID Eulah Pont, MD   50 mg at 06/30/17 0951  . mirtazapine (REMERON) tablet 15 mg  15 mg Oral QHS Leone Brand, NP   15 mg at 06/29/17 2118  . morphine 2 MG/ML injection 2 mg  2 mg Intravenous Q2H PRN Azalee Course, PA   2 mg at 06/26/17 1926  . multivitamin with minerals tablet 1 tablet  1 tablet Oral Daily Leone Brand, NP   1 tablet at 06/30/17 0951  . nicotine (NICODERM CQ - dosed in mg/24 hours) patch 14 mg  14 mg Transdermal Daily Leone Brand, NP   14 mg at 06/30/17 0955  . nitroGLYCERIN (NITROSTAT) SL tablet 0.4 mg  0.4 mg Sublingual Q5 Min x 3 PRN Leone Brand, NP   0.4 mg at 06/26/17 1828  . ondansetron (ZOFRAN) injection 4 mg  4 mg Intravenous Q6H PRN Tonny Bollman, MD      . sertraline (ZOLOFT) tablet 100 mg  100 mg Oral Daily Leone Brand, NP   100 mg at 06/30/17 0951  . sodium chloride flush (NS) 0.9 % injection 3 mL  3 mL Intravenous Q12H Tonny Bollman, MD   3 mL at 06/28/17 2232  . sodium chloride flush (NS) 0.9 % injection 3 mL  3 mL Intravenous PRN Tonny Bollman, MD        REVIEW OF SYSTEMS:  [X]  denotes positive finding, [ ]  denotes negative finding Cardiac  Comments:  Chest pain or chest  pressure: X   Shortness of breath upon exertion: X   Short of breath when lying flat:    Irregular heart rhythm:        Vascular    Pain in calf, thigh, or hip  brought on by ambulation: X   Pain in feet at night that wakes you up from your sleep:  X   Blood clot in your veins:    Leg swelling:         Pulmonary    Oxygen at home:    Productive cough:     Wheezing:         Neurologic    Sudden weakness in arms or legs:     Sudden numbness in arms or legs:     Sudden onset of difficulty speaking or slurred speech:    Temporary loss of vision in one eye:     Problems with dizziness:         Gastrointestinal    Blood in stool:     Vomited blood:         Genitourinary    Burning when urinating:     Blood in urine:        Psychiatric    Major depression:         Hematologic    Bleeding problems:    Problems with blood clotting too easily:        Skin    Rashes or ulcers:        Constitutional    Fever or chills:     PHYSICAL EXAM:   Vitals:   06/29/17 2055 06/29/17 2120 06/30/17 0626 06/30/17 1106  BP: (!) 119/55  122/67 140/73  Pulse: 83  (!) 109 77  Resp: 16   18  Temp: (!) 100.4 F (38 C) 99 F (37.2 C) 98.9 F (37.2 C) 98.3 F (36.8 C)  TempSrc: Oral Oral Oral Oral  SpO2: 97%  100% 100%  Weight:   174 lb 14.4 oz (79.3 kg)   Height:       GENERAL: The patient is a poorly nourished male, in no acute distress. The vital signs are documented above. CARDIAC: There is a regular rate and rhythm.  VASCULAR: I do not detect carotid bruits. He has slightly diminished but palpable femoral pulses bilaterally.   I cannot palpate popliteal or pedal pulses on either side. He has no significant lower extremity swelling. PULMONARY: There is good air exchange bilaterally without wheezing or rales. ABDOMEN: Soft and non-tender with normal pitched bowel sounds.  MUSCULOSKELETAL: There are no major deformities or cyanosis. NEUROLOGIC: No focal weakness or paresthesias  are detected. SKIN: The patient has some slight discoloration on his left third toe. PSYCHIATRIC: The patient has a normal affect.  DATA:    ARTERIAL DOPPLER STUDY: I reviewed the arterial Doppler study that was done on 04/28/2017. This showed monophasic Doppler signals in the dorsalis pedis and posterior tibial positions bilaterally. ABI on the right was 53%. ABI on the left was 47%.  CT ANGIOGRAM CHEST ABDOMEN AND PELVIS: I reviewed his CT angiogram that was done on 04/25/2017. 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 There was a high-grade stenosis of the celiac axis and a moderate stenosis of both renal arteries. The superior mesenteric artery and inferior mesenteric artery were patent. Overall he has diffuse multilevel arterial occlusive disease.  ARTERIOGRAM: The patient underwent an arteriogram on 06/08/2017.  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  There was diffuse aortoiliac occlusive disease and bilateral hypogastric artery occlusions.  There was a greater than 50% infrarenal aortic stenosis.  On the left side there was diffuse disease in the common iliac artery, external iliac  artery.  Common femoral artery was patent with a tight stenosis in the proximal deep femoral artery.  Superficial femoral artery was occluded at its origin with reconstitution of the popliteal artery at the level of the knee.  Two-vessel runoff on the left via the anterior tibial and posterior tibial arteries which had mild diffuse disease.  On the right side, there was diffuse disease in the common iliac artery and external iliac artery.  Was also significant plaque in the common femoral artery.  Occluded at its origin.  The deep femoral artery was patent.  There was reconstitution of the popliteal artery at the level knee with single-vessel runoff on the right via the posterior tibial artery.   MEDICAL ISSUES:   LEFT SUBCLAVIAN ARTERY STENOSIS:  Patient has a moderate left subclavian artery stenosis proximal to the IMA graft.  I would agree that this will need to be addressed patient has been symptomatic cardiac standpoint.  The risk with this is significant however and this would need to be done in the operating room.  I would place a balloon expandable covered stent so that the subclavian artery would only be occluded briefly.  The earliest that we can schedule this with me next week.  Therefore, from my standpoint he can be discharged and we can arrange for this as an outpatient.  MULTILEVEL ARTERIAL OCCLUSIVE DISEASE: The patient does have multilevel arterial occlusive disease however the wound on the third toe is stable.  I think once that he has recovered dressing the left subclavian artery stenosis we could consider whether or not he was a candidate for an right axillobifemoral bypass and left femoropopliteal bypass.  This would be associated with significant risk given his malnutrition, cardiac history, and debilitated state.  Fortunately, at this point the wound on the foot is stable.  The alternative if this progressed would be primary amputation which would not be unreasonable given the overall clinical picture.  I agree with Dr. Earmon Phoenix assessment that the patient has very poor functional capacity and would be at high risk from a cardiac standpoint for any major vascular surgery.    Waverly Ferrari [facility] [phone]

## 2017-06-30 NOTE — Care Management Important Message (Signed)
Important Message  Patient Details  Name: Jordan Gardner MRN: [id number] Date of Birth: 09/08/1950   Medicare Important Message Given:  Yes    Larayne Baxley 06/30/2017, 1:00 PM

## 2017-06-30 NOTE — Progress Notes (Signed)
ANTICOAGULATION CONSULT NOTE - Follow Up Consult  Pharmacy Consult for Heparin  Indication: chest pain/ACS  No Known Allergies  Patient Measurements: Height: 6\' 1"  (185.4 cm) Weight: 174 lb 14.4 oz (79.3 kg)(a scale) IBW/kg (Calculated) : 79.9  Vital Signs: Temp: 98.9 F (37.2 C) (11/13 0626) Temp Source: Oral (11/13 0626) BP: 122/67 (11/13 0626) Pulse Rate: 109 (11/13 0626)  Labs: Recent Labs    06/27/17 2239  06/28/17 0608 06/28/17 1042 06/29/17 0425 06/30/17 0424  HGB  --    < > 10.4*  --  10.7* 10.4*  HCT  --   --  32.3*  --  32.5* 31.4*  PLT  --   --  154  --  135* 150  HEPARINUNFRC  --   --  0.50  --  0.39 0.40  CREATININE  --   --   --  1.13 1.16 1.25*  TROPONINI 15.88*  --   --  12.06* 10.34*  --    < > = values in this interval not displayed.   Estimated Creatinine Clearance: 66.1 mL/min (A) (by C-G formula based on SCr of 1.25 mg/dL (H)).  Assessment: 66 yo male with NSTEMI s/p cath. Heparin continues for recurrent CP. He is also noted with PAD but per notes high surgical risk.Troponin 10 on 11/12, as the value peaked over the weekend at ~16 now trending down. Chest pain seems to be controlled at this time.   Heparin level therapeutic: 0.40, CBC stable, Hgb 10.4, PLT 150, no overt bleeding documented   Goal of Therapy:  Heparin level 0.3-0.7 units/ml Monitor platelets by anticoagulation protocol: Yes   Plan:  -Continue heparin gtt at 1300 units/hr -Daily CBC and heparin level -Noted plans to continue heparin per cardiology  Ruben Imony Hamlin Devine, PharmD Clinical Pharmacist 06/30/2017 8:42 AM

## 2017-07-01 ENCOUNTER — Other Ambulatory Visit: Payer: Self-pay | Admitting: Cardiology

## 2017-07-01 DIAGNOSIS — Z79899 Other long term (current) drug therapy: Secondary | ICD-10-CM

## 2017-07-01 DIAGNOSIS — I25700 Atherosclerosis of coronary artery bypass graft(s), unspecified, with unstable angina pectoris: Secondary | ICD-10-CM

## 2017-07-01 DIAGNOSIS — I70229 Atherosclerosis of native arteries of extremities with rest pain, unspecified extremity: Secondary | ICD-10-CM

## 2017-07-01 DIAGNOSIS — I998 Other disorder of circulatory system: Secondary | ICD-10-CM

## 2017-07-01 LAB — BASIC METABOLIC PANEL
Anion gap: 4 — ABNORMAL LOW (ref 5–15)
BUN: 9 mg/dL (ref 6–20)
CHLORIDE: 107 mmol/L (ref 101–111)
CO2: 26 mmol/L (ref 22–32)
CREATININE: 1.29 mg/dL — AB (ref 0.61–1.24)
Calcium: 8.5 mg/dL — ABNORMAL LOW (ref 8.9–10.3)
GFR calc Af Amer: >60 mL/min (ref >60)
GFR calc non Af Amer: 57 mL/min — ABNORMAL LOW (ref >60)
GLUCOSE: 132 mg/dL — AB (ref 65–99)
Potassium: 5 mmol/L (ref 3.5–5.1)
Sodium: 137 mmol/L (ref 135–145)

## 2017-07-01 LAB — GLUCOSE, CAPILLARY
Glucose-Capillary: 124 mg/dL — ABNORMAL HIGH (ref 65–99)
Glucose-Capillary: 204 mg/dL — ABNORMAL HIGH (ref 65–99)

## 2017-07-01 LAB — CBC
HEMATOCRIT: 31.1 % — AB (ref 39.0–52.0)
HEMOGLOBIN: 10.2 g/dL — AB (ref 13.0–17.0)
MCH: 27.5 pg (ref 26.0–34.0)
MCHC: 32.8 g/dL (ref 30.0–36.0)
MCV: 83.8 fL (ref 78.0–100.0)
Platelets: 156 10*3/uL (ref 150–400)
RBC: 3.71 MIL/uL — ABNORMAL LOW (ref 4.22–5.81)
RDW: 13.9 % (ref 11.5–15.5)
WBC: 8.5 10*3/uL (ref 4.0–10.5)

## 2017-07-01 LAB — HEPARIN LEVEL (UNFRACTIONATED): Heparin Unfractionated: 0.46 IU/mL (ref 0.30–0.70)

## 2017-07-01 MED ORDER — CLOPIDOGREL BISULFATE 75 MG PO TABS: 75.0000 mg | ORAL_TABLET | Freq: Every day | ORAL | 1 refills | Status: AC

## 2017-07-01 MED ORDER — METOPROLOL TARTRATE 50 MG PO TABS: 50.0000 mg | ORAL_TABLET | Freq: Two times a day (BID) | ORAL | 1 refills | Status: DC

## 2017-07-01 MED ORDER — ISOSORBIDE MONONITRATE ER 30 MG PO TB24: 60.0000 mg | ORAL_TABLET | Freq: Every day | ORAL | Status: DC

## 2017-07-01 MED ORDER — LISINOPRIL 2.5 MG PO TABS: 2.5000 mg | ORAL_TABLET | Freq: Every day | ORAL | 1 refills | Status: AC

## 2017-07-01 NOTE — Progress Notes (Signed)
ANTICOAGULATION CONSULT NOTE - Follow Up Consult  Pharmacy Consult for Heparin Indication: ACS/NSTEMI  No Known Allergies  Patient Measurements: Height: 6\' 1"  (185.4 cm) Weight: 174 lb 1.6 oz (79 kg) IBW/kg (Calculated) : 79.9 Heparin Dosing Weight: 79 kg  Vital Signs: Temp: 98.4 F (36.9 C) (11/14 1213) Temp Source: Oral (11/14 1213) BP: 124/72 (11/14 1213) Pulse Rate: 66 (11/14 1213)  Labs: Recent Labs    06/29/17 0425 06/30/17 0424 07/01/17 0514  HGB 10.7* 10.4* 10.2*  HCT 32.5* 31.4* 31.1*  PLT 135* 150 156  HEPARINUNFRC 0.39 0.40 0.46  CREATININE 1.16 1.25* 1.29*  TROPONINI 10.34*  --   --     Estimated Creatinine Clearance: 63.8 mL/min (A) (by C-G formula based on SCr of 1.29 mg/dL (H)).  Assessment:  66 yo male with NSTEMI s/p cath 06/26/17. Continues on IV heparin.   Noted plan for left subclavian stent in OR next week.   Heparin level remains therapeutic (0.46) on 1300 units/hr. CBC stable.  Goal of Therapy:  Heparin level 0.3-0.7 units/ml Monitor platelets by anticoagulation protocol: Yes   Plan:   Continue heparin drip at 1300 units/hr.  Daily heparin level and CBC while on heparin.  Noted plan to begin Plavix but no order; expect Plavix post-op next week. Currently on ASA 81 mg daily and Cilostazol 100 mg BID.  Dennie Fettersgan, Ruther Ephraim Donovan, [place] Pager: [phone] 07/01/2017,12:20 PM

## 2017-07-01 NOTE — Progress Notes (Signed)
Pt has orders to be discharged. Discharge instructions given and pt ans his sister, they have no additional questions at this time. Medication regimen reviewed and pt and sister educated. Pt and sister verbalized understanding and have no additional questions. Telemetry box removed. IV removed and site in good condition. Pt stable and waiting for transportation.  Wynona NeatJessica Neah Sporrer RN

## 2017-07-01 NOTE — Progress Notes (Addendum)
Inpatient Diabetes Program Recommendations  AACE/ADA: New Consensus Statement on Inpatient Glycemic Control (2015)  Target Ranges:  Prepandial:   less than 140 mg/dL      Peak postprandial:   less than 180 mg/dL (1-2 hours)      Critically ill patients:  140 - 180 mg/dL   Lab Results  Component Value Date   GLUCAP 204 (H) 07/01/2017   HGBA1C 7.7 (H) 06/26/2017    Review of Glycemic ControlResults for [place], Jordan Gardner (MRN [id number]) as of 07/01/2017 11:41  Ref. Range 06/30/2017 12:24 06/30/2017 17:37 06/30/2017 21:16 07/01/2017 07:46 07/01/2017 11:18  Glucose-Capillary Latest Ref Range: 65 - 99 mg/dL 409262 (H) 811123 (H) 914116 (H) 124 (H) 204 (H)   Diabetes history: Type 2 DM Outpatient Diabetes medications: Metformin 1000 mg bid  Current orders for Inpatient glycemic control:  Novolog moderate tid with meals and HS, Lantus 8 units daily Inpatient Diabetes Program Recommendations:    Please consider adding Novolog meal coverage 3 units tid with meals.   Thanks, Beryl MeagerJenny Tristram Milian, RN, BC-ADM Inpatient Diabetes Coordinator Pager [phone] (8a-5p)  Addendum:  Referral received.  Based on A1C, patient should not need insulin at d/c.  He should be able to resume oral agents once d/c'd and follow-up with MD.  Will follow.

## 2017-07-01 NOTE — Discharge Summary (Signed)
The patient has been seen in conjunction with Geoffry Paradise, NP-C. All aspects of care have been considered and discussed. The patient has been personally interviewed, examined, and all clinical data has been reviewed.   Difficult situation in this 66 year old veteran whose care has been fragmented, and spread between the [place] [facility] system and local medical.  He has chronic bilateral lower extremity critical limb ischemia and is too high risk because of cardiac disease to undergo vascular surgery.  Likely a skin lesion on the left foot is healing.  He presented with a non-ST elevation MI on this occasion underwent angiography which demonstrated all vein grafts to be closed.  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  Plan is for eventual subclavian stenting via [facility], Dr. Edilia Bo.  This will be set up.  Plan is discharged today and agree with the note as outlined below.  There will not his primary providers we will have a TOC follow-up 7-10 days after discharge.  Discharge Summary    Patient ID: Jordan Gardner,  MRN: [id number], DOB/AGE: 07-05-1951 66 y.o.  Admit date: 06/26/2017 Discharge date: 07/01/2017  Primary Care Provider: [facility], [facility]: [facility] Tresa Endo)   Discharge Diagnoses    Active Problems:   NSTEMI (non-ST elevated myocardial infarction) [facility])   Coronary artery disease involving coronary bypass graft of native heart with unstable angina pectoris [facility])   Critical lower limb ischemia   Allergies No Known Allergies  Diagnostic Studies/Procedures    Cath: 06/26/17  Conclusion   1. Severe 3 vessel CAD with total occlusion of the RCA, total occlusion of the mid-circumflex, and severe LAD/diagonal stenosis 2. 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 Total occlusion of all SVG's with the SVG-unknown target (probable OM1)  as the patient's culprit occlusion 4. Moderate segmental LV systolic dysfunction  Complex situation in patient with critical limb ischemia, very poor functional capacity, and consideration of aorto-bifemoral bypass. He is not a candidate for acute intervention and medical therapy may be the most reasonable approach. The subclavian stenosis and ostial diagonal stenosis could be approached percutaneously but I'm not sure that would alter this patient's clinical course/prognosis. In my opinion he will be at high cardiac risk of major vascular surgery.  _____________   History of Present Illness     Jordan Gardner has a history of of CAD, CABG 5 years ago at [facility], + tobacco 2ppd, DM-2, HLD and disability walks with walker.Last admitted 04/25/17 with NSTEMI Pk troponin 0.72EKG with chronic RBBB and LPFB. He refused cath and would only do for refractory chest pain.  He also had episode of unresponsiveness that admit.   EF on Echo was 60-65% G1 DD. Trivial aortic regurg.    06/08/17 pt admitted with progressive ischemia of both lower ext.  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 There was diffuse aortoiliac occlusive disease and also bilateral hypogastric artery occlusions. Rt and Lt with diffuse disease with rest pain and wound on lt foot.  Recommended revascularization with an aortobifemoral bypass and left femoropopliteal bypass versus axillobifemoral bypass and left femoropopliteal bypass vs primary amputation if felt too high risk for surgery.     [facility] [place]    On admission he presented with EMS with chest pain.  Began the day prior as he was just  smoking and developed Lt ant chest pain associated with diaphoresis and SOB.  No nausea.  He took 13 NTG and then EMS gave 3 without much change.  He did receive an ASA. Troponin in the ED was 3.44 and placed on IV heparin, and nitro.   Hospital Course     Consultants: [facility]  He was  taken for cardiac cath noted above with Dr. Excell Seltzer that showed severe 3v disease with 1/4 patent grafts (LIMA to the LAD) with moderate LV dysfunction. He was not considered a candidate for acute intervention. It was also fel that he would be high risk for cardiac surgery. Plan was for medical therapy. Follow up echo showed EF of 50%. Troponin peaked at 15.88, Hgb A1c 7.7, LDL 58. He was continued on IV nitro post cath, but able to wean to Imdur. He was continued on heparin for an additional 48 hours post cath. His case was discussed with Dr. Edilia Bo who consulted on the patient this admission. If was felt that bilateral lower extremity vas surgery would be difficult and high risk. Plan for left subclavian stent implantation to be done in the OR at a later date. Dr. Katrinka Blazing added plavix, and pletal was stopped with agreement with Dr. Edilia Bo. For now his PAD will be treated medically. Medical therapy was titrated to include metoprolol 50mg  BID, lisinopril 2.5mg  daily, Imdur 60mg  daily, Lipitor 80mg  daily, ASA and plavix. Of note did discuss with the patient the need to see his PCP back in 2 weeks to further discuss management of his DM, as his blood sugars have been elevated this admission. He was continued on metformin at the time of discharge.   Jordan Gardner was seen by Dr. Katrinka Blazing and determined stable for discharge home. Follow up in the office has been arranged. Medications are listed below.   _____________  Discharge Vitals Blood pressure 124/72, pulse 66, temperature 98.4 F (36.9 C), temperature source Oral, resp. rate 20, height 6\' 1"  (1.854 m), weight 174 lb 1.6 oz (79 kg), SpO2 97 %.  Filed Weights   06/29/17 0358 06/30/17 0626 07/01/17 0553  Weight: 172 lb 14.4 oz (78.4 kg) 174 lb 14.4 oz (79.3 kg) 174 lb 1.6 oz (79 kg)    Labs & Radiologic Studies    CBC Recent Labs    06/30/17 0424 07/01/17 0514  WBC 8.3 8.5  HGB 10.4* 10.2*  HCT 31.4* 31.1*  MCV 83.1 83.8  PLT 150 156    Basic Metabolic Panel Recent Labs    16/10/96 0424 07/01/17 0514  NA 132* 137  K 4.3 5.0  CL 105 107  CO2 20* 26  GLUCOSE 248* 132*  BUN 13 9  CREATININE 1.25* 1.29*  CALCIUM 8.1* 8.5*   Liver Function Tests No results for input(s): AST, ALT, ALKPHOS, BILITOT, PROT, ALBUMIN in the last 72 hours. No results for input(s): LIPASE, AMYLASE in the last 72 hours. Cardiac Enzymes Recent Labs    06/29/17 0425  TROPONINI 10.34*   BNP Invalid input(s): POCBNP D-Dimer No results for input(s): DDIMER in the last 72 hours. Hemoglobin A1C No results for input(s): HGBA1C in the last 72 hours. Fasting Lipid Panel No results for input(s): CHOL, HDL, LDLCALC, TRIG, CHOLHDL, LDLDIRECT in the last 72 hours. Thyroid Function Tests No results for input(s): TSH, T4TOTAL, T3FREE, THYROIDAB in the last 72 hours.  Invalid input(s): FREET3 _____________  Dg Chest 2 View  Result Date: 06/26/2017 CLINICAL DATA:  Left upper chest pain today. EXAM: CHEST  2  VIEW COMPARISON:  04/25/2017 and chest CT 04/25/2017 FINDINGS: Median sternotomy wires unchanged. Lungs are adequately inflated with mild stable elevation of the left hemidiaphragm. There is no focal airspace consolidation or effusion. Cardiomediastinal silhouette and remainder of the exam is unchanged. IMPRESSION: No active cardiopulmonary disease. Electronically Signed   By: Elberta Fortisaniel  Boyle M.D.   On: 06/26/2017 08:30   Disposition   Pt is being discharged home today in good condition.  Follow-up Plans & Appointments    Follow-up Information    [facility].   Why:  Please arrange follow up with your primary MD within the next 2 weeks to discuss your diabetes management.  Contact information: [address] [place] [address] [phone]        Chuck Hintickson, Christopher S, MD Follow up.   Specialties:  Vascular Surgery, Cardiology Why:  The office will call you to set up for your surgery  planned around first part of December.  Contact information: [address] [place] [address] [phone]        Lennette BihariKelly, Thomas A, MD Follow up.   Specialty:  Cardiology Why:  The office will call you with a follow up appt.  Contact information: [address] [place] [address] [phone]        [facility] Follow up on 07/03/2017.   Specialty:  Cardiology Why:  Please come in for follow up labs to check your kidney function.  Contact information: [address] [place] [id number] [phone]         Discharge Instructions    Diet - low sodium heart healthy   Complete by:  As directed    Discharge instructions   Complete by:  As directed    Please monitor your blood pressures at home, and bring to follow up appt. Appt [facility] been made with [facility] to ensure that you are seen back in the office soon, if unable [facility]. You will need to come in for follow up blood work this Friday. Please call with any questions.   Increase activity slowly   Complete by:  As directed       Discharge Medications     Medication List    STOP taking these medications   cilostazol 100 MG tablet Commonly known as:  PLETAL     TAKE these medications   aspirin 81 MG EC tablet Take 1 tablet (81 mg total) by mouth daily.   aspirin-acetaminophen-caffeine 250-250-65 MG tablet Commonly known as:  EXCEDRIN MIGRAINE Take 2 tablets by mouth every 8 (eight) hours as needed for headache.   atorvastatin 40 MG tablet Commonly known as:  LIPITOR Take 80 mg by mouth at bedtime.   clopidogrel 75 MG tablet Commonly known as:  PLAVIX Take 1 tablet (75 mg total) daily by mouth.   feeding supplement (GLUCERNA SHAKE) Liqd Take 237 mLs by mouth 2 (two) times daily between meals.   isosorbide mononitrate 30 MG 24 hr tablet Commonly known as:  IMDUR Take 2 tablets (60 mg total) daily by mouth. What  changed:  how much to take   lisinopril 2.5 MG tablet Commonly known as:  PRINIVIL,ZESTRIL Take 1 tablet (2.5 mg total) daily by mouth. Start taking on:  07/02/2017   metFORMIN 500 MG tablet Commonly known as:  GLUCOPHAGE Take 1,000 mg by mouth 2 (two) times daily with a meal.   metoprolol tartrate 50 MG tablet Commonly known as:  LOPRESSOR Take 1 tablet (50 mg total) 2 (two) times daily by mouth. What changed:    medication strength  how much to take   mirtazapine 15 MG tablet Commonly known as:  REMERON Take 15 mg by mouth at bedtime.   multivitamin with minerals Tabs tablet Take 1 tablet by mouth daily.   nicotine 14 mg/24hr patch Commonly known as:  NICODERM CQ - dosed in mg/24 hours Place 1 patch (14 mg total) onto the skin daily.   nitroGLYCERIN 0.4 MG SL tablet Commonly known as:  NITROSTAT Place 1 tablet (0.4 mg total) under the tongue every 5 (five) minutes x 3 doses as needed for chest pain.   sertraline 100 MG tablet Commonly known as:  ZOLOFT Take 100 mg by mouth daily. DEPRESSION/ANXIETY        Aspirin prescribed at discharge?  Yes High Intensity Statin Prescribed? (Lipitor 40-80mg  or Crestor 20-40mg ): Yes Beta Blocker Prescribed? Yes For EF <40%, was ACEI/ARB Prescribed? Yes ADP Receptor Inhibitor Prescribed? (i.e. Plavix etc.-Includes Medically Managed Patients): Yes For EF <40%, Aldosterone Inhibitor Prescribed? No: EF ok Was EF assessed during THIS hospitalization? Yes Was Cardiac Rehab II ordered? (Included Medically managed Patients): Yes   Outstanding Labs/Studies   LFTs/FLP in 6 weeks if tolerating statin increase. BMET on Friday.  Duration of Discharge Encounter   Greater than 30 minutes including physician time.  Signed, Laverda PageLindsay Roberts NP-C 07/01/2017, 1:45 PM

## 2017-07-06 ENCOUNTER — Other Ambulatory Visit: Payer: Self-pay | Admitting: *Deleted

## 2017-07-08 ENCOUNTER — Encounter: Payer: Self-pay | Admitting: Vascular Surgery

## 2017-07-08 ENCOUNTER — Other Ambulatory Visit: Payer: Self-pay | Admitting: *Deleted

## 2017-07-10 NOTE — Progress Notes (Signed)
Call placed to schedule PAT appointment. Patient offered appointments on 11/30, 12/3, 12/4, and 12/5. Patient stated he was not available to come in.

## 2017-07-22 ENCOUNTER — Ambulatory Visit: Payer: Medicare HMO | Admitting: Physician Assistant

## 2017-07-22 ENCOUNTER — Other Ambulatory Visit: Payer: Self-pay

## 2017-07-22 ENCOUNTER — Encounter (HOSPITAL_COMMUNITY): Payer: Self-pay | Admitting: *Deleted

## 2017-07-22 NOTE — Progress Notes (Signed)
Anesthesia Chart Review: SAME DAY WORK-UP. (On 07/10/17, he was offered a PAT visit on 11/30, 12/3, 12/4, and 12/5 but he stated that he was not available to come in.)  Patient is a 66 year old male scheduled for bypass left subclavian artery Viabahn stent graft on 08/02/17 by Dr. [name]. 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  - Admitted 06/26/17 - 07/01/17 with NSTEMI with unstable angina (took 13+ Nitro) with initial troponin 3.44. [facility] Per discharge summary, "He was taken for cardiac cath noted above with Dr. Excell Seltzerooper that showed severe 3v disease with 1/4 patent grafts (LIMA to the LAD) with moderate LV dysfunction. He was not considered a candidate for acute intervention. It was also fel that he would be high risk for cardiac surgery. Plan was for medical therapy. Follow up echo showed EF of 50%. Troponin peaked at 15.88, Hgb A1c 7.7, LDL 58. He was continued on IV nitro post cath, but able to wean to Imdur. He was continued on heparin for an additional 48 hours post cath. His case was discussed with Dr. Edilia Boickson who consulted on the patient this admission. If was felt that bilateral lower extremity vas surgery would be difficult and high risk. Plan for left subclavian stent implantation to be done in the OR at a later date. Dr. Katrinka BlazingSmith added plavix, and pletal was stopped with agreement with Dr. Edilia Boickson. For now his PAD will be treated medically. Medical therapy was titrated to include metoprolol 50mg  BID, lisinopril 2.5mg  daily, Imdur 60mg  daily, Lipitor 80mg  daily,  ASA and plavix."  - Same day admission 06/08/17 with progressive ischemia BLE. He underwent PV angiogram (see below) with revascularization recommended with aortobifemoral bypass and left femoropopliteal bypass versus axillobifemoral bypass and left femoropopliteal bypass vs primary amputation if felt too high risk for surgery.    - Admitted 04/25/17 - 04/29/17 with NSTEMI (troponin 0.72) with chronic RBBB and LPFB. He refused cardiac cath. Had an episode of unresponsiveness that admission. Neuro consulted. EEG findings suggestive of nonspecific generalized cerebral dysfunction, no a left form activity seen. EF 60-65%.   [facility]. Primary cardiologist is with the [facility]. Locally, his primary cardiologist is Dr. Nicki Guadalajarahomas Kelly, although there has been several cardiologists with [facility] that have seen patient since 04/2017. Patient was a no show for his hospital follow-up appointment with Harrell LarkH. Meng, PA-C today. Dr. Edilia Boickson reported that he had talked with cardiologist Dr. Tonny BollmanMichael Cooper twice about this patient who was aware of surgical plans.    Meds currently listed (patient bringing in meds) includes ASA 81 mg, Excedrin Migraine, Lipitor, Plavix, Imdur, lisinopril, metformin, Lopressor, Remeron, Nitro, Zoloft.  EKG 06/28/17: NSR, right BB, left posterior fascicular block, bifascicular block, possible inferior infarct (age undetermined).  Echo 06/27/17: Study Conclusions - Left ventricle: The cavity size was normal. Wall thickness was   increased in a pattern of mild LVH. The estimated ejection   fraction was 50%. There is hypokinesis of the   basal-midinferolateral and inferior myocardium. Doppler   parameters are consistent with abnormal left ventricular   relaxation (grade 1 diastolic dysfunction). - Aortic valve: There was mild regurgitation. -  Mitral valve: There was mild regurgitation. - Left atrium: The atrium was mildly dilated. - Right atrium:  Central venous pressure (est): 3 mm Hg. - Tricuspid valve: There was trivial regurgitation. - Pulmonary arteries: Systolic pressure could not be accurately   estimated. - Pericardium, extracardiac: There was no pericardial effusion. Impressions: - Mild LVH with LVEF approximately 50%. There is hypokinesis of the   mid to basal inferior/inferolateral walls. Grade 1 diastolic   dysfunction. Mild mitral regurgitation. Mild left atrial   enlargement. Mild aortic regurgitation. Trivial tricuspid   regurgitation.  LHC 06/26/17: 1. Severe 3 vessel CAD with total occlusion of the RCA, total occlusion of the mid-circumflex, and severe LAD/diagonal stenosis 2. 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 Total occlusion of all SVG's with the SVG-unknown target (probable OM1) as the patient's culprit occlusion 4. Moderate segmental LV systolic dysfunction Complex situation in patient with critical limb ischemia, very poor functional capacity, and consideration of aorto-bifemoral bypass. He is not a candidate for acute intervention and medical therapy may be the most reasonable approach. The subclavian stenosis and ostial diagonal stenosis could be approached percutaneously but I'm not sure that would alter this patient's clinical course/prognosis. In my opinion he will be at high cardiac risk of major vascular surgery.  Aortogram with BLE runoff 06/08/17: 1. 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 2. 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 3. There is diffuse aortoiliac occlusive disease and also bilateral hypogastric artery occlusions. 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 4. On the left side, there is diffuse disease of the common iliac artery and external iliac artery. The common femoral artery is patent. There is a tight  stenosis of the proximal deep femoral artery on the left. The superficial femoral arteries occluded at its origin with reconstitution of the popliteal artery at the level of the knee. There is two-vessel runoff on the left via the anterior tibial and posterior tibial arteries which have mild diffuse disease. 5. On the right side, there is diffuse disease of the common iliac artery and external iliac artery. There is also significant plaque in the common femoral artery. The deep femoral artery is patent. The superficial femoral arteries occluded at its origin. There is reconstitution of the popliteal artery at the level of the knee. There is single-vessel runoff on the right via the posterior tibial artery. CLINICAL NOTE: This patient has multilevel arterial occlusive disease with rest pain and wound on his left foot. This is clearly a limb threatening situation. The situation is complicated by the fact that he has a sacral decubitus, has routine calorie malnutrition, and just had a non-ST MI. His options for revascularization would be an aortobifemoral bypass and left femoropopliteal bypass versus axillobifemoral bypass and left femoropopliteal bypass. He is at high-risk for surgery given his medical comorbidities. He will need preoperative cardiac evaluation. The third alternative would be primary amputation if he was felt to be too high risk for surgery.  CXR 06/26/17: FINDINGS: Median sternotomy wires unchanged. Lungs are adequately inflated with mild stable elevation of the left hemidiaphragm. There is no focal airspace consolidation or effusion. Cardiomediastinal silhouette and remainder of the exam is unchanged. IMPRESSION: No active cardiopulmonary disease.  CTA chest/abd/pelvis 04/25/17: IMPRESSION: 1. No evidence of thoracic or abdominal aortic dissection. No evidence of significant pulmonary embolus. 2. Extensive aortic  and branch vessel atherosclerosis with calcific and noncalcific  atherosclerotic changes. 3. Ulcerated plaque formation demonstrated in the aortic arch. 4. Moderate stenosis of the distal abdominal aorta. High-grade stenosis of the celiac axis with reconstitution of flow via collaterals. Moderate stenosis of both renal artery origins. Areas of stenosis demonstrated in both common iliac arteries, both external iliac arteries, both common femoral arteries, and occlusion of both internal iliac arteries. 5. Diffuse emphysematous changes in the lungs. Chronic bronchitic changes. Atelectasis or infiltration demonstrated in both lung bases.  He will need updated labs prior to surgery. As of 07/01/17, Cr 1.29, glucose 132, H/H 10.2/31.1, PLT 156. A1c 7.7 on 06/26/17.  Patient with NSTEMI X 2 since 04/2017. Medical therapy recommended following 06/2017 cath. Patient apparently with another brief episode of chest pain a few days ago, but did not require Nitro. Medications were optimized during his recent hospitalization, although unfortunately he failed to go to his cardiology hospital follow-up appointment today. He declined several PAT visit offers as well. Above discussed with anesthesiologist Dr. Bradley Ferris. He also called and spoke with Dr. Edilia Bo. Anesthesiologist and surgeon to re-evaluate patient tomorrow on arrival to determine the definitive anesthesia plan. Would plan for a-line.    [facility] Short Stay Center/Anesthesiology Phone [phone] 07/22/2017 4:52 PM

## 2017-07-22 NOTE — Progress Notes (Addendum)
Spoke with pt for pre-op call. Pt recently had a cardiac cath done in November. He states he had chest pain a few days ago. States it only lasted a few minutes after he sat and rested. He did say he did have nausea, diaphoresis and sob. States he's not had any since then. Pt is a type 2 diabetic. Last A1C was 7.7 on 06/26/17. He states his fasting blood sugar is usually in the 150's. Instructed pt not to take his Metformin in the AM. Instructed pt to check his blood sugar in the AM when he gets up. If blood sugar is 70 or below, treat with 1/2 cup of clear juice (apple or cranberry) and recheck blood sugar 15 minutes after drinking juice. He voiced understanding, Chart to be reviewed by Anesthesia PA/NP

## 2017-07-22 NOTE — Anesthesia Preprocedure Evaluation (Addendum)
Anesthesia Evaluation  Patient identified by MRN, date of birth, ID band Patient awake    Reviewed: Allergy & Precautions, NPO status , Patient's Chart, lab work & pertinent test results, reviewed documented beta blocker date and time   History of Anesthesia Complications Negative for: history of anesthetic complications  Airway Mallampati: II  TM Distance: >3 FB Neck ROM: Full    Dental  (+) Dental Advisory Given   Pulmonary asthma , Current Smoker,    Pulmonary exam normal breath sounds clear to auscultation       Cardiovascular Exercise Tolerance: Poor hypertension, Pt. on medications and Pt. on home beta blockers + angina + CAD, + Past MI, + CABG, + Peripheral Vascular Disease and +CHF  Normal cardiovascular exam Rhythm:Regular Rate:Normal  EKG - SR with RBBB and LPFB  TTE 2018 - mild LVH. EF was 50%. There is hypokinesis of the basal-midinferolateral and inferior myocardium. Grade 1 diastolic dysfunction. Mild AI and MR, trivial TR. Mildly dilated LA.  Cath 2018 - 1. Severe 3 vessel CAD with total occlusion of the RCA, total occlusion of the mid-circumflex, and severe LAD/diagonal stenosis 2. 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 Total occlusion of all SVG's with the SVG-unknown target (probable OM1) as the patient's culprit occlusion 4. Moderate segmental LV systolic dysfunction  Per cardiology - "Complex situation in patient with critical limb ischemia, very poor functional capacity, and consideration of aorto-bifemoral bypass. He is not a candidate for acute intervention and medical therapy may be the most reasonable approach. The subclavian stenosis and ostial diagonal stenosis could be approached percutaneously but I'm not sure that would alter this patient's clinical course/prognosis. In my opinion he will be at high cardiac risk of major vascular  surgery"   Neuro/Psych  Headaches, PSYCHIATRIC DISORDERS Anxiety Depression Schizophrenia CVA, Residual Symptoms    GI/Hepatic negative GI ROS, Neg liver ROS,   Endo/Other  diabetes, Type 2, Oral Hypoglycemic Agents  Renal/GU Renal disease  negative genitourinary   Musculoskeletal  (+) Arthritis ,   Abdominal   Peds  Hematology negative hematology ROS (+)   Anesthesia Other Findings Patient is a 66 year old male scheduled for bypass left subclavian artery Viabahn stent graft on 08/02/17 by Dr. [name]. 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  - Admitted 06/26/17 - 07/01/17 with NSTEMI with unstable angina (took 13+ Nitro) with initial troponin 3.44. [facility] Per discharge summary, "He was taken for cardiac cath noted above with Dr. Excell Seltzerooper that showed severe 3v disease with 1/4 patent grafts (LIMA to the LAD) with moderate LV dysfunction. He was not considered a candidate for acute intervention. It was also fel that he would be high risk for cardiac surgery. Plan was for medical therapy. Follow up echo showed EF of 50%. Troponin peaked at 15.88, Hgb A1c 7.7, LDL 58. He was continued on IV nitro post cath, but able to wean to Imdur. He was continued on heparin for an additional 48 hours post cath. His case was discussed with Dr. Edilia Boickson who consulted on the patient this admission. If was  felt that bilateral lower extremity vas surgery would be difficult and high risk. Plan for left subclavian stent implantation to be done in the OR at a later date. Dr.  Katrinka BlazingSmith added plavix, and pletal was stopped with agreement with Dr. Edilia Boickson. For now his PAD will be treated medically. Medical therapy was titrated to include metoprolol 50mg  BID, lisinopril 2.5mg  daily, Imdur 60mg  daily, Lipitor 80mg  daily, ASA and plavix." - Same day admission 06/08/17 with progressive ischemia BLE. He underwent PV angiogram (see below) with revascularization recommended with aortobifemoral bypass and left femoropopliteal bypass versus axillobifemoral bypass and left femoropopliteal bypass vs primary amputation if felt too high risk for surgery.  - Admitted 04/25/17 - 04/29/17 with NSTEMI (troponin 0.72) with chronic RBBB and LPFB. He refused cardiac cath. Had an episode of unresponsiveness that admission. Neuro consulted. EEG findings suggestive of nonspecific generalized cerebral dysfunction, no a left form activity seen. EF 60-65%.   [facility]. Primary cardiologist is with the [facility] Locally, his primary cardiologist is Dr. Nicki Guadalajarahomas Kelly, although there has been several cardiologists with [facility] that have seen patient since 04/2017. Patient was a no show for his hospital follow-up appointment with Harrell LarkH. Meng, PA-C today. Dr. Edilia Boickson reported that he had talked with cardiologist Dr. Tonny BollmanMichael Cooper twice about this patient who was aware of surgical plans.    Meds currently listed (patient bringing in meds) includes ASA 81 mg, Excedrin Migraine, Lipitor, Plavix, Imdur, lisinopril, metformin, Lopressor, Remeron, Nitro, Zoloft.  EKG 06/28/17: NSR, right BB, left posterior fascicular block, bifascicular block, possible inferior infarct (age undetermined).  Echo 06/27/17: Study Conclusions - Left ventricle: The cavity size was normal. Wall thickness was increased in a pattern of mild LVH. The estimated ejection fraction was 50%. There is hypokinesis of the basal-midinferolateral and inferior myocardium. Doppler parameters are  consistent with abnormal left ventricular relaxation (grade 1 diastolic dysfunction). - Aortic valve: There was mild regurgitation. - Mitral valve: There was mild regurgitation. - Left atrium: The atrium was mildly dilated. - Right atrium: Central venous pressure (est): 3 mm Hg. - Tricuspid valve: There was trivial regurgitation. - Pulmonary arteries: Systolic pressure could not be accurately estimated. - Pericardium, extracardiac: There was no pericardial effusion. Impressions: - Mild LVH with LVEF approximately 50%. There is hypokinesis of the mid to basal inferior/inferolateral walls. Grade 1 diastolic dysfunction. Mild mitral regurgitation. Mild left atrial enlargement. Mild aortic regurgitation. Trivial tricuspid regurgitation.  LHC 06/26/17: 1. Severe 3 vessel CAD with total occlusion of the RCA, total occlusion of the mid-circumflex, and severe LAD/diagonal stenosis 2. 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 Total occlusion of all SVG's with the SVG-unknown target (probable OM1) as the patient's culprit occlusion 4. Moderate segmental LV systolic dysfunction Complex situation in patient with critical limb ischemia, very poor functional capacity, and consideration of aorto-bifemoral bypass. He is not a candidate for acute intervention and medical therapy may be the most reasonable approach. The subclavian stenosis and ostial diagonal stenosis could be approached percutaneously but I'm not sure that would alter this patient's clinical course/prognosis. In my opinion he will be at high cardiac risk of major vascular surgery.  Aortogram with BLE runoff 06/08/17: 1. 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 2. 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 3. There is diffuse aortoiliac occlusive disease and also bilateral hypogastric  artery occlusions. There  is a significant greater than 50% infrarenal aortic stenosis. 4. On the left side, there is diffuse disease of the common iliac artery and external iliac artery. The common femoral artery is patent. There is a tight stenosis of the proximal deep femoral artery on the left. The superficial femoral arteries occluded at its origin with reconstitution of the popliteal artery at the level of the knee. There is two-vessel runoff on the left via the anterior tibial and posterior tibial arteries which have mild diffuse disease. 5. On the right side, there is diffuse disease of the common iliac artery and external iliac artery. There is also significant plaque in the common femoral artery. The deep femoral artery is patent. The superficial femoral arteries occluded at its origin. There is reconstitution of the popliteal artery at the level of the knee. There is single-vessel runoff on the right via the posterior tibial artery. CLINICAL NOTE: This patient has multilevel arterial occlusive disease with rest pain and wound on his left foot. This is clearly a limb threatening situation. The situation is complicated by the fact that he has a sacral decubitus, has routine calorie malnutrition, and just had a non-ST MI. His options for revascularization would be an aortobifemoral bypass and left femoropopliteal bypass versus axillobifemoral bypass and left femoropopliteal bypass. He is at high-risk for surgery given his medical comorbidities. He will need preoperative cardiac evaluation. The third alternative would be primary amputation if he was felt to be too high risk for surgery.  CXR 06/26/17: FINDINGS: Median sternotomy wires unchanged. Lungs are adequately inflated with mild stable elevation of the left hemidiaphragm. There is no focal airspace consolidation or effusion. Cardiomediastinal silhouette and remainder of the exam is unchanged. IMPRESSION: No active cardiopulmonary  disease.  CTA chest/abd/pelvis 04/25/17: IMPRESSION: 1. No evidence of thoracic or abdominal aortic dissection. No evidence of significant pulmonary embolus. 2. Extensive aortic and branch vessel atherosclerosis with calcific and noncalcific atherosclerotic changes. 3. Ulcerated plaque formation demonstrated in the aortic arch. 4. Moderate stenosis of the distal abdominal aorta. High-grade stenosis of the celiac axis with reconstitution of flow via collaterals. Moderate stenosis of both renal artery origins. Areas of stenosis demonstrated in both common iliac arteries, both external iliac arteries, both common femoral arteries, and occlusion of both internal iliac arteries. 5. Diffuse emphysematous changes in the lungs. Chronic bronchitic changes. Atelectasis or infiltration demonstrated in both lung bases.  He will need updated labs prior to surgery. As of 07/01/17, Cr 1.29, glucose 132, H/H 10.2/31.1, PLT 156. A1c 7.7 on 06/26/17.  Patient with NSTEMI X 2 since 04/2017. Medical therapy recommended following 06/2017 cath.    Reproductive/Obstetrics                            Anesthesia Physical Anesthesia Plan  ASA: IV  Anesthesia Plan: General   Post-op Pain Management:    Induction: Intravenous  PONV Risk Score and Plan: 3 and Treatment may vary due to age or medical condition, Dexamethasone, Ondansetron and Midazolam  Airway Management Planned: Oral ETT  Additional Equipment: Arterial line  Intra-op Plan:   Post-operative Plan: Possible Post-op intubation/ventilation  Informed Consent: I have reviewed the patients History and Physical, chart, labs and discussed the procedure including the risks, benefits and alternatives for the proposed anesthesia with the patient or authorized representative who has indicated his/her understanding and acceptance.   Dental advisory given  Plan Discussed with: CRNA  Anesthesia Plan Comments:  Anesthesia Quick Evaluation  

## 2017-07-22 NOTE — Progress Notes (Deleted)
Cardiology Office Note    Date:  07/22/2017   ID:  Jordan Gardner, DOB 09/06/1950, MRN [id number]  [facility]  Cardiologist:  ***   No chief complaint on file.   History of Present Illness:  Jordan Gardner is a 66 y.o. male ***    Past Medical History:  Diagnosis Date  . Coronary artery disease   . Diabetes mellitus without complication (HCC)   . Hyperlipidemia   . Hypertension   . PAD (peripheral artery disease) (HCC)   . Protein calorie malnutrition (HCC) 04/2017  . Psychotic affective disorder (HCC)    Per sister. Unsure of Dx. Pt sleeps late and difficult to wake. odd affect  . Sacral decubitus ulcer 04/2017    Past Surgical History:  Procedure Laterality Date  . ABDOMINAL AORTOGRAM W/LOWER EXTREMITY N/A 06/08/2017   Procedure: ABDOMINAL AORTOGRAM W/LOWER EXTREMITY;  Surgeon: Chuck Hintickson, Christopher S, MD;  [facility];  Service: Cardiovascular;  Laterality: N/A;  . BACK SURGERY    . CARDIAC CATHETERIZATION    . CARDIAC SURGERY     CABG x5 2012 - [facility]  . LEFT HEART CATH AND CORS/GRAFTS ANGIOGRAPHY N/A 06/26/2017   Procedure: LEFT HEART CATH AND CORS/GRAFTS ANGIOGRAPHY;  Surgeon: Tonny Bollmanooper, Michael, MD;  [facility];  Service: Cardiovascular;  Laterality: N/A;    Current Medications: Outpatient Medications Prior to Visit  Medication Sig Dispense Refill  . aspirin EC 81 MG EC tablet Take 1 tablet (81 mg total) by mouth daily.    Marland Kitchen. aspirin-acetaminophen-caffeine (EXCEDRIN MIGRAINE) 250-250-65 MG tablet Take 2 tablets by mouth every 8 (eight) hours as needed for headache.    Marland Kitchen. atorvastatin (LIPITOR) 40 MG tablet Take 80 mg by mouth at bedtime.    . clopidogrel (PLAVIX) 75 MG tablet Take 1 tablet (75 mg total) daily by mouth. 30 tablet 1  . feeding supplement, GLUCERNA SHAKE, (GLUCERNA SHAKE) LIQD Take 237 mLs by mouth 2 (two) times daily between meals. (Patient not taking: Reported on 06/26/2017)  0  . isosorbide  mononitrate (IMDUR) 30 MG 24 hr tablet Take 2 tablets (60 mg total) daily by mouth.    Marland Kitchen. lisinopril (PRINIVIL,ZESTRIL) 2.5 MG tablet Take 1 tablet (2.5 mg total) daily by mouth. 30 tablet 1  . metFORMIN (GLUCOPHAGE) 500 MG tablet Take 1,000 mg by mouth 2 (two) times daily with a meal.    . metoprolol tartrate (LOPRESSOR) 50 MG tablet Take 1 tablet (50 mg total) 2 (two) times daily by mouth. 60 tablet 1  . mirtazapine (REMERON) 15 MG tablet Take 15 mg by mouth at bedtime.    . Multiple Vitamin (MULTIVITAMIN WITH MINERALS) TABS tablet Take 1 tablet by mouth daily.    . nicotine (NICODERM CQ - DOSED IN MG/24 HOURS) 14 mg/24hr patch Place 1 patch (14 mg total) onto the skin daily. (Patient not taking: Reported on 06/02/2017) 28 patch 0  . nitroGLYCERIN (NITROSTAT) 0.4 MG SL tablet Place 1 tablet (0.4 mg total) under the tongue every 5 (five) minutes x 3 doses as needed for chest pain.  12  . sertraline (ZOLOFT) 100 MG tablet Take 100 mg by mouth daily. DEPRESSION/ANXIETY     No facility-administered medications prior to visit.      Allergies:   Patient has no known allergies.   Social History   Socioeconomic History  . Marital status: Single    Spouse name: Not on file  . Number of children: Not on file  .  Years of education: Not on file  . Highest education level: Not on file  Social Needs  . Financial resource strain: Not on file  . Food insecurity - worry: Not on file  . Food insecurity - inability: Not on file  . Transportation needs - medical: Not on file  . Transportation needs - non-medical: Not on file  Occupational History  . Not on file  Tobacco Use  . Smoking status: Current Every Day Smoker    Packs/day: 1.00    Types: Cigarettes  . Smokeless tobacco: Never Used  Substance and Sexual Activity  . Alcohol use: No  . Drug use: No  . Sexual activity: Not on file  Other Topics Concern  . Not on file  Social History Narrative  . Not on file     Family History:  The  patient's ***family history includes Hypertension in his sister.   ROS:   Please see the history of present illness.    ROS All other systems reviewed and are negative.   PHYSICAL EXAM:   VS:  There were no vitals taken for this visit.   GEN: Well nourished, well developed, in no acute distress HEENT: normal Neck: no JVD, carotid bruits, or masses Cardiac: ***RRR; no murmurs, rubs, or gallops,no edema  Respiratory:  clear to auscultation bilaterally, normal work of breathing GI: soft, nontender, nondistended, + BS MS: no deformity or atrophy Skin: warm and dry, no rash Neuro:  Alert and Oriented x 3, Strength and sensation are intact Psych: euthymic mood, full affect  Wt Readings from Last 3 Encounters:  07/01/17 174 lb 1.6 oz (79 kg)  06/08/17 180 lb (81.6 kg)  05/27/17 178 lb 3.2 oz (80.8 kg)      Studies/Labs Reviewed:   EKG:  EKG is*** ordered today.  The ekg ordered today demonstrates ***  Recent Labs: 04/25/2017: B Natriuretic Peptide 404.5 06/26/2017: ALT 14; Magnesium 1.1; TSH 3.840 07/01/2017: BUN 9; Creatinine, Ser 1.29; Hemoglobin 10.2; Platelets 156; Potassium 5.0; Sodium 137   Lipid Panel    Component Value Date/Time   CHOL 109 06/27/2017 0211   TRIG 77 06/27/2017 0211   HDL 36 (L) 06/27/2017 0211   CHOLHDL 3.0 06/27/2017 0211   VLDL 15 06/27/2017 0211   LDLCALC 58 06/27/2017 0211    Additional studies/ records that were reviewed today include:  ***    ASSESSMENT:    No diagnosis found.   PLAN:  In order of problems listed above:  1. ***    Medication Adjustments/Labs and Tests Ordered: Current medicines are reviewed at length with the patient today.  Concerns regarding medicines are outlined above.  Medication changes, Labs and Tests ordered today are listed in the Patient Instructions below. There are no Patient Instructions on file for this visit.   Ramond DialSigned, Marylynn Rigdon, [place]  07/22/2017 8:22 AM    [facility] [address], [place], [place]  [address] Phone: [phone]) [phone]; Fax: [phone]

## 2017-07-23 ENCOUNTER — Encounter (HOSPITAL_COMMUNITY): Payer: Self-pay | Admitting: Certified Registered"

## 2017-07-23 ENCOUNTER — Inpatient Hospital Stay (HOSPITAL_COMMUNITY): Payer: Medicare HMO | Admitting: Certified Registered"

## 2017-07-23 ENCOUNTER — Encounter (HOSPITAL_COMMUNITY)
Admission: RE | Disposition: A | Discharge status: Home / Self Care | Payer: Self-pay | Source: Ambulatory Visit | Attending: Vascular Surgery

## 2017-07-23 ENCOUNTER — Inpatient Hospital Stay (HOSPITAL_COMMUNITY)
Admission: RE | Admit: 2017-07-23 | Discharge: 2017-07-24 | DRG: 253 | Disposition: A | Discharge status: Home / Self Care | Payer: Medicare HMO | Source: Ambulatory Visit | Attending: Vascular Surgery | Admitting: Vascular Surgery

## 2017-07-23 ENCOUNTER — Other Ambulatory Visit: Payer: Self-pay

## 2017-07-23 DIAGNOSIS — I2510 Atherosclerotic heart disease of native coronary artery without angina pectoris: Secondary | ICD-10-CM | POA: Diagnosis present

## 2017-07-23 DIAGNOSIS — E46 Unspecified protein-calorie malnutrition: Secondary | ICD-10-CM | POA: Diagnosis present

## 2017-07-23 DIAGNOSIS — E1151 Type 2 diabetes mellitus with diabetic peripheral angiopathy without gangrene: Secondary | ICD-10-CM | POA: Diagnosis present

## 2017-07-23 DIAGNOSIS — I252 Old myocardial infarction: Secondary | ICD-10-CM

## 2017-07-23 DIAGNOSIS — I708 Atherosclerosis of other arteries: Secondary | ICD-10-CM | POA: Diagnosis present

## 2017-07-23 DIAGNOSIS — Z8249 Family history of ischemic heart disease and other diseases of the circulatory system: Secondary | ICD-10-CM | POA: Diagnosis not present

## 2017-07-23 DIAGNOSIS — F17210 Nicotine dependence, cigarettes, uncomplicated: Secondary | ICD-10-CM | POA: Diagnosis present

## 2017-07-23 DIAGNOSIS — E785 Hyperlipidemia, unspecified: Secondary | ICD-10-CM | POA: Diagnosis present

## 2017-07-23 DIAGNOSIS — I350 Nonrheumatic aortic (valve) stenosis: Secondary | ICD-10-CM | POA: Diagnosis present

## 2017-07-23 DIAGNOSIS — Z8673 Personal history of transient ischemic attack (TIA), and cerebral infarction without residual deficits: Secondary | ICD-10-CM

## 2017-07-23 DIAGNOSIS — Z79899 Other long term (current) drug therapy: Secondary | ICD-10-CM | POA: Diagnosis not present

## 2017-07-23 DIAGNOSIS — Z951 Presence of aortocoronary bypass graft: Secondary | ICD-10-CM | POA: Diagnosis not present

## 2017-07-23 DIAGNOSIS — I10 Essential (primary) hypertension: Secondary | ICD-10-CM | POA: Diagnosis present

## 2017-07-23 DIAGNOSIS — I771 Stricture of artery: Secondary | ICD-10-CM | POA: Diagnosis present

## 2017-07-23 DIAGNOSIS — Z6823 Body mass index (BMI) 23.0-23.9, adult: Secondary | ICD-10-CM

## 2017-07-23 DIAGNOSIS — I70208 Unspecified atherosclerosis of native arteries of extremities, other extremity: Secondary | ICD-10-CM

## 2017-07-23 DIAGNOSIS — Z794 Long term (current) use of insulin: Secondary | ICD-10-CM | POA: Diagnosis not present

## 2017-07-23 DIAGNOSIS — Z7902 Long term (current) use of antithrombotics/antiplatelets: Secondary | ICD-10-CM

## 2017-07-23 DIAGNOSIS — Z7982 Long term (current) use of aspirin: Secondary | ICD-10-CM | POA: Diagnosis not present

## 2017-07-23 HISTORY — DX: Headache: R51

## 2017-07-23 HISTORY — DX: Cerebral infarction, unspecified: I63.9

## 2017-07-23 HISTORY — PX: CAROTID-SUBCLAVIAN BYPASS GRAFT: SHX910

## 2017-07-23 HISTORY — DX: Unspecified osteoarthritis, unspecified site: M19.90

## 2017-07-23 HISTORY — DX: Major depressive disorder, single episode, unspecified: F32.9

## 2017-07-23 HISTORY — DX: Headache, unspecified: R51.9

## 2017-07-23 HISTORY — DX: Depression, unspecified: F32.A

## 2017-07-23 HISTORY — DX: Anxiety disorder, unspecified: F41.9

## 2017-07-23 HISTORY — DX: Myoneural disorder, unspecified: G70.9

## 2017-07-23 HISTORY — DX: Acute myocardial infarction, unspecified: I21.9

## 2017-07-23 HISTORY — DX: Unspecified asthma, uncomplicated: J45.909

## 2017-07-23 LAB — CBC
HCT: 30.8 % — ABNORMAL LOW (ref 39.0–52.0)
HEMOGLOBIN: 10.1 g/dL — AB (ref 13.0–17.0)
MCH: 27.2 pg (ref 26.0–34.0)
MCHC: 32.8 g/dL (ref 30.0–36.0)
MCV: 82.8 fL (ref 78.0–100.0)
Platelets: 187 10*3/uL (ref 150–400)
RBC: 3.72 MIL/uL — AB (ref 4.22–5.81)
RDW: 14.2 % (ref 11.5–15.5)
WBC: 10.2 10*3/uL (ref 4.0–10.5)

## 2017-07-23 LAB — COMPREHENSIVE METABOLIC PANEL
ALT: 15 U/L — AB (ref 17–63)
AST: 13 U/L — AB (ref 15–41)
Albumin: 2.9 g/dL — ABNORMAL LOW (ref 3.5–5.0)
Alkaline Phosphatase: 77 U/L (ref 38–126)
Anion gap: 10 (ref 5–15)
BUN: 17 mg/dL (ref 6–20)
CHLORIDE: 108 mmol/L (ref 101–111)
CO2: 19 mmol/L — AB (ref 22–32)
CREATININE: 1.21 mg/dL (ref 0.61–1.24)
Calcium: 8.4 mg/dL — ABNORMAL LOW (ref 8.9–10.3)
GFR calc Af Amer: >60 mL/min (ref >60)
GFR calc non Af Amer: >60 mL/min (ref >60)
Glucose, Bld: 108 mg/dL — ABNORMAL HIGH (ref 65–99)
Potassium: 4 mmol/L (ref 3.5–5.1)
SODIUM: 137 mmol/L (ref 135–145)
Total Bilirubin: 0.6 mg/dL (ref 0.3–1.2)
Total Protein: 5.9 g/dL — ABNORMAL LOW (ref 6.5–8.1)

## 2017-07-23 LAB — PROTIME-INR
INR: 1.24
Prothrombin Time: 15.5 seconds — ABNORMAL HIGH (ref 11.4–15.2)

## 2017-07-23 LAB — GLUCOSE, CAPILLARY
GLUCOSE-CAPILLARY: 102 mg/dL — AB (ref 65–99)
Glucose-Capillary: 138 mg/dL — ABNORMAL HIGH (ref 65–99)
Glucose-Capillary: 300 mg/dL — ABNORMAL HIGH (ref 65–99)

## 2017-07-23 LAB — APTT: APTT: 37 s — AB (ref 24–36)

## 2017-07-23 LAB — TYPE AND SCREEN
ABO/RH(D): A POS
Antibody Screen: NEGATIVE

## 2017-07-23 LAB — SURGICAL PCR SCREEN
MRSA, PCR: NEGATIVE
STAPHYLOCOCCUS AUREUS: NEGATIVE

## 2017-07-23 LAB — ABO/RH: ABO/RH(D): A POS

## 2017-07-23 SURGERY — CREATION, BYPASS, ARTERIAL, SUBCLAVIAN TO CAROTID, USING GRAFT
Anesthesia: General | Site: Arm Upper | Laterality: Left

## 2017-07-23 MED ORDER — PHENYLEPHRINE HCL 10 MG/ML IJ SOLN
INTRAMUSCULAR | Status: DC | PRN
  Administered 2017-07-23: 35 ug/min via INTRAVENOUS
  Administered 2017-07-23: 25 ug/min via INTRAVENOUS

## 2017-07-23 MED ORDER — MUPIROCIN 2 % EX OINT
TOPICAL_OINTMENT | CUTANEOUS | Status: AC
  Administered 2017-07-23: 1
  Filled 2017-07-23: qty 22

## 2017-07-23 MED ORDER — SODIUM CHLORIDE 0.9% FLUSH
3.0000 mL | Freq: Two times a day (BID) | INTRAVENOUS | Status: DC
  Administered 2017-07-23: 3 mL via INTRAVENOUS

## 2017-07-23 MED ORDER — HEPARIN SODIUM (PORCINE) 5000 UNIT/ML IJ SOLN
INTRAMUSCULAR | Status: DC | PRN
  Administered 2017-07-23: 500 mL

## 2017-07-23 MED ORDER — PHENYLEPHRINE HCL 10 MG/ML IJ SOLN
INTRAMUSCULAR | Status: DC | PRN
  Administered 2017-07-23 (×3): 40 ug via INTRAVENOUS

## 2017-07-23 MED ORDER — METOPROLOL TARTRATE 50 MG PO TABS
50.0000 mg | ORAL_TABLET | Freq: Two times a day (BID) | ORAL | Status: DC
  Administered 2017-07-23 – 2017-07-24 (×2): 50 mg via ORAL
  Filled 2017-07-23 (×2): qty 1

## 2017-07-23 MED ORDER — MIDAZOLAM HCL 2 MG/2ML IJ SOLN
INTRAMUSCULAR | Status: AC
  Filled 2017-07-23: qty 2

## 2017-07-23 MED ORDER — MIRTAZAPINE 15 MG PO TABS
15.0000 mg | ORAL_TABLET | Freq: Every day | ORAL | Status: DC
  Administered 2017-07-23: 15 mg via ORAL
  Filled 2017-07-23: qty 1

## 2017-07-23 MED ORDER — NICOTINE 14 MG/24HR TD PT24
14.0000 mg | MEDICATED_PATCH | Freq: Every day | TRANSDERMAL | Status: DC
  Administered 2017-07-23 – 2017-07-24 (×2): 14 mg via TRANSDERMAL
  Filled 2017-07-23 (×2): qty 1

## 2017-07-23 MED ORDER — EPHEDRINE SULFATE-NACL 50-0.9 MG/10ML-% IV SOSY
PREFILLED_SYRINGE | INTRAVENOUS | Status: DC | PRN
  Administered 2017-07-23: 5 mg via INTRAVENOUS

## 2017-07-23 MED ORDER — PHENYLEPHRINE 40 MCG/ML (10ML) SYRINGE FOR IV PUSH (FOR BLOOD PRESSURE SUPPORT)
PREFILLED_SYRINGE | INTRAVENOUS | Status: DC | PRN
  Administered 2017-07-23 (×2): 80 ug via INTRAVENOUS

## 2017-07-23 MED ORDER — LIDOCAINE 2% (20 MG/ML) 5 ML SYRINGE
INTRAMUSCULAR | Status: AC
  Filled 2017-07-23: qty 5

## 2017-07-23 MED ORDER — CHLORHEXIDINE GLUCONATE CLOTH 2 % EX PADS: 6.0000 | MEDICATED_PAD | Freq: Once | CUTANEOUS | Status: DC

## 2017-07-23 MED ORDER — SODIUM CHLORIDE 0.9 % IV SOLN: 250.0000 mL | INTRAVENOUS | Status: DC | PRN

## 2017-07-23 MED ORDER — ROCURONIUM BROMIDE 10 MG/ML (PF) SYRINGE
PREFILLED_SYRINGE | INTRAVENOUS | Status: AC
  Filled 2017-07-23: qty 5

## 2017-07-23 MED ORDER — OXYCODONE HCL 5 MG PO TABS
5.0000 mg | ORAL_TABLET | ORAL | Status: DC | PRN
  Administered 2017-07-23: 10 mg via ORAL
  Administered 2017-07-23: 5 mg via ORAL
  Administered 2017-07-24 (×2): 10 mg via ORAL
  Filled 2017-07-23 (×4): qty 2

## 2017-07-23 MED ORDER — SODIUM CHLORIDE 0.9% FLUSH: 3.0000 mL | Freq: Two times a day (BID) | INTRAVENOUS | Status: DC

## 2017-07-23 MED ORDER — 0.9 % SODIUM CHLORIDE (POUR BTL) OPTIME
TOPICAL | Status: DC | PRN
  Administered 2017-07-23: 1000 mL

## 2017-07-23 MED ORDER — PROPOFOL 10 MG/ML IV BOLUS
INTRAVENOUS | Status: AC
  Filled 2017-07-23: qty 20

## 2017-07-23 MED ORDER — ENOXAPARIN SODIUM 40 MG/0.4ML ~~LOC~~ SOLN
40.0000 mg | SUBCUTANEOUS | Status: DC
  Administered 2017-07-24: 40 mg via SUBCUTANEOUS
  Filled 2017-07-23: qty 0.4

## 2017-07-23 MED ORDER — PROTAMINE SULFATE 10 MG/ML IV SOLN
INTRAVENOUS | Status: DC | PRN
  Administered 2017-07-23: 10 mg via INTRAVENOUS
  Administered 2017-07-23: 20 mg via INTRAVENOUS
  Administered 2017-07-23: 10 mg via INTRAVENOUS

## 2017-07-23 MED ORDER — HYDRALAZINE HCL 20 MG/ML IJ SOLN: 5.0000 mg | INTRAMUSCULAR | Status: DC | PRN

## 2017-07-23 MED ORDER — PAPAVERINE HCL 30 MG/ML IJ SOLN
INTRAMUSCULAR | Status: AC
  Filled 2017-07-23: qty 2

## 2017-07-23 MED ORDER — ACETAMINOPHEN 325 MG PO TABS
650.0000 mg | ORAL_TABLET | ORAL | Status: DC | PRN
Start: 2017-07-23 — End: 2017-07-24

## 2017-07-23 MED ORDER — SODIUM CHLORIDE 0.9% FLUSH: 3.0000 mL | INTRAVENOUS | Status: DC | PRN

## 2017-07-23 MED ORDER — EPHEDRINE 5 MG/ML INJ
INTRAVENOUS | Status: AC
  Filled 2017-07-23: qty 10

## 2017-07-23 MED ORDER — SODIUM CHLORIDE 0.9 % WEIGHT BASED INFUSION: 3.0000 mL/kg/h | INTRAVENOUS | Status: AC

## 2017-07-23 MED ORDER — ISOSORBIDE MONONITRATE ER 60 MG PO TB24
60.0000 mg | ORAL_TABLET | Freq: Every day | ORAL | Status: DC
  Administered 2017-07-23 – 2017-07-24 (×2): 60 mg via ORAL
  Filled 2017-07-23 (×2): qty 1

## 2017-07-23 MED ORDER — LISINOPRIL 2.5 MG PO TABS
2.5000 mg | ORAL_TABLET | Freq: Every day | ORAL | Status: DC
  Administered 2017-07-23 – 2017-07-24 (×2): 2.5 mg via ORAL
  Filled 2017-07-23 (×2): qty 1

## 2017-07-23 MED ORDER — SUGAMMADEX SODIUM 200 MG/2ML IV SOLN
INTRAVENOUS | Status: AC
  Filled 2017-07-23: qty 2

## 2017-07-23 MED ORDER — GLUCERNA SHAKE PO LIQD
237.0000 mL | Freq: Two times a day (BID) | ORAL | Status: DC
  Administered 2017-07-23 – 2017-07-24 (×2): 237 mL via ORAL

## 2017-07-23 MED ORDER — METFORMIN HCL 500 MG PO TABS
1000.0000 mg | ORAL_TABLET | Freq: Two times a day (BID) | ORAL | Status: DC
  Administered 2017-07-23 – 2017-07-24 (×2): 1000 mg via ORAL
  Filled 2017-07-23 (×2): qty 2

## 2017-07-23 MED ORDER — HEPARIN SODIUM (PORCINE) 1000 UNIT/ML IJ SOLN
INTRAMUSCULAR | Status: DC | PRN
  Administered 2017-07-23: 8000 [IU] via INTRAVENOUS

## 2017-07-23 MED ORDER — LABETALOL HCL 5 MG/ML IV SOLN: 10.0000 mg | INTRAVENOUS | Status: DC | PRN

## 2017-07-23 MED ORDER — SUGAMMADEX SODIUM 200 MG/2ML IV SOLN
INTRAVENOUS | Status: DC | PRN
  Administered 2017-07-23: 160 mg via INTRAVENOUS

## 2017-07-23 MED ORDER — ONDANSETRON HCL 4 MG/2ML IJ SOLN
INTRAMUSCULAR | Status: AC
  Filled 2017-07-23: qty 2

## 2017-07-23 MED ORDER — ONDANSETRON HCL 4 MG/2ML IJ SOLN
4.0000 mg | Freq: Once | INTRAMUSCULAR | Status: DC | PRN
Start: 2017-07-23 — End: 2017-07-23

## 2017-07-23 MED ORDER — SODIUM CHLORIDE 0.9 % WEIGHT BASED INFUSION: 1.0000 mL/kg/h | INTRAVENOUS | Status: DC

## 2017-07-23 MED ORDER — ROCURONIUM BROMIDE 10 MG/ML (PF) SYRINGE
PREFILLED_SYRINGE | INTRAVENOUS | Status: DC | PRN
  Administered 2017-07-23: 50 mg via INTRAVENOUS

## 2017-07-23 MED ORDER — NITROGLYCERIN 0.4 MG SL SUBL: 0.4000 mg | SUBLINGUAL_TABLET | SUBLINGUAL | Status: DC | PRN

## 2017-07-23 MED ORDER — IODIXANOL 320 MG/ML IV SOLN
INTRAVENOUS | Status: DC | PRN
  Administered 2017-07-23: 100 mL via INTRAVENOUS

## 2017-07-23 MED ORDER — DEXAMETHASONE SODIUM PHOSPHATE 10 MG/ML IJ SOLN
INTRAMUSCULAR | Status: AC
  Filled 2017-07-23: qty 1

## 2017-07-23 MED ORDER — ASPIRIN EC 81 MG PO TBEC
81.0000 mg | DELAYED_RELEASE_TABLET | Freq: Every day | ORAL | Status: DC
  Administered 2017-07-23 – 2017-07-24 (×2): 81 mg via ORAL
  Filled 2017-07-23 (×4): qty 1

## 2017-07-23 MED ORDER — FENTANYL CITRATE (PF) 100 MCG/2ML IJ SOLN
INTRAMUSCULAR | Status: DC | PRN
  Administered 2017-07-23 (×2): 25 ug via INTRAVENOUS

## 2017-07-23 MED ORDER — ONDANSETRON HCL 4 MG/2ML IJ SOLN
INTRAMUSCULAR | Status: DC | PRN
  Administered 2017-07-23: 4 mg via INTRAVENOUS

## 2017-07-23 MED ORDER — FENTANYL CITRATE (PF) 100 MCG/2ML IJ SOLN
INTRAMUSCULAR | Status: AC
  Administered 2017-07-23: 50 ug via INTRAVENOUS
  Filled 2017-07-23: qty 2

## 2017-07-23 MED ORDER — ATORVASTATIN CALCIUM 80 MG PO TABS
80.0000 mg | ORAL_TABLET | Freq: Every day | ORAL | Status: DC
  Administered 2017-07-23: 80 mg via ORAL
  Filled 2017-07-23: qty 1

## 2017-07-23 MED ORDER — SODIUM CHLORIDE 0.9 % WEIGHT BASED INFUSION
1.0000 mL/kg/h | INTRAVENOUS | Status: AC
  Administered 2017-07-23: 1 mL/kg/h via INTRAVENOUS

## 2017-07-23 MED ORDER — ASPIRIN-ACETAMINOPHEN-CAFFEINE 250-250-65 MG PO TABS
2.0000 | ORAL_TABLET | Freq: Three times a day (TID) | ORAL | Status: DC | PRN
  Filled 2017-07-23: qty 2

## 2017-07-23 MED ORDER — FENTANYL CITRATE (PF) 250 MCG/5ML IJ SOLN
INTRAMUSCULAR | Status: AC
Start: 2017-07-23 — End: 2017-07-23
  Filled 2017-07-23: qty 5

## 2017-07-23 MED ORDER — ADULT MULTIVITAMIN W/MINERALS CH: 1.0000 | ORAL_TABLET | Freq: Every day | ORAL | Status: DC

## 2017-07-23 MED ORDER — PROPOFOL 10 MG/ML IV BOLUS
INTRAVENOUS | Status: DC | PRN
  Administered 2017-07-23: 70 mg via INTRAVENOUS
  Administered 2017-07-23 (×2): 20 mg via INTRAVENOUS

## 2017-07-23 MED ORDER — ORAL CARE MOUTH RINSE: 15.0000 mL | Freq: Two times a day (BID) | OROMUCOSAL | Status: DC

## 2017-07-23 MED ORDER — FENTANYL CITRATE (PF) 100 MCG/2ML IJ SOLN
25.0000 ug | INTRAMUSCULAR | Status: DC | PRN
  Administered 2017-07-23 (×2): 50 ug via INTRAVENOUS

## 2017-07-23 MED ORDER — LACTATED RINGERS IV SOLN
INTRAVENOUS | Status: DC | PRN
  Administered 2017-07-23: 07:00:00 via INTRAVENOUS

## 2017-07-23 MED ORDER — ONDANSETRON HCL 4 MG/2ML IJ SOLN: 4.0000 mg | Freq: Four times a day (QID) | INTRAMUSCULAR | Status: DC | PRN

## 2017-07-23 MED ORDER — SODIUM CHLORIDE 0.9 % IV SOLN: INTRAVENOUS | Status: DC

## 2017-07-23 MED ORDER — HEPARIN SODIUM (PORCINE) 1000 UNIT/ML IJ SOLN
INTRAMUSCULAR | Status: AC
  Filled 2017-07-23: qty 1

## 2017-07-23 MED ORDER — LIDOCAINE 2% (20 MG/ML) 5 ML SYRINGE
INTRAMUSCULAR | Status: DC | PRN
  Administered 2017-07-23: 60 mg via INTRAVENOUS

## 2017-07-23 MED ORDER — CLOPIDOGREL BISULFATE 75 MG PO TABS
75.0000 mg | ORAL_TABLET | Freq: Every day | ORAL | Status: DC
  Administered 2017-07-23 – 2017-07-24 (×2): 75 mg via ORAL
  Filled 2017-07-23 (×2): qty 1

## 2017-07-23 MED ORDER — PAPAVERINE HCL 30 MG/ML IJ SOLN
INTRAMUSCULAR | Status: DC | PRN
  Administered 2017-07-23: 2 mL via INTRAVENOUS

## 2017-07-23 MED ORDER — PROTAMINE SULFATE 10 MG/ML IV SOLN
INTRAVENOUS | Status: AC
  Filled 2017-07-23: qty 5

## 2017-07-23 MED ORDER — DEXTROSE 5 % IV SOLN
1.5000 g | INTRAVENOUS | Status: AC
  Administered 2017-07-23: 1.5 g via INTRAVENOUS
  Filled 2017-07-23: qty 1.5

## 2017-07-23 MED ORDER — ASPIRIN 81 MG PO CHEW: 81.0000 mg | CHEWABLE_TABLET | ORAL | Status: DC

## 2017-07-23 MED ORDER — SERTRALINE HCL 100 MG PO TABS
100.0000 mg | ORAL_TABLET | Freq: Every day | ORAL | Status: DC
  Administered 2017-07-23 – 2017-07-24 (×2): 100 mg via ORAL
  Filled 2017-07-23 (×2): qty 1

## 2017-07-23 MED ORDER — OXYCODONE HCL 5 MG PO TABS
ORAL_TABLET | ORAL | Status: AC
  Filled 2017-07-23: qty 1

## 2017-07-23 MED ORDER — DEXAMETHASONE SODIUM PHOSPHATE 10 MG/ML IJ SOLN
INTRAMUSCULAR | Status: DC | PRN
  Administered 2017-07-23: 10 mg via INTRAVENOUS

## 2017-07-23 MED ORDER — PHENYLEPHRINE 40 MCG/ML (10ML) SYRINGE FOR IV PUSH (FOR BLOOD PRESSURE SUPPORT)
PREFILLED_SYRINGE | INTRAVENOUS | Status: AC
  Filled 2017-07-23: qty 10

## 2017-07-23 MED ORDER — NITROGLYCERIN IN D5W 200-5 MCG/ML-% IV SOLN
INTRAVENOUS | Status: DC | PRN
  Administered 2017-07-23: 10 ug/min via INTRAVENOUS

## 2017-07-23 SURGICAL SUPPLY — 64 items
ADH SKN CLS APL DERMABOND .7 (GAUZE/BANDAGES/DRESSINGS) ×1
BAG DECANTER FOR FLEXI CONT (MISCELLANEOUS) ×3 IMPLANT
BAG SNAP BAND KOVER 36X36 (MISCELLANEOUS) ×2 IMPLANT
BALLN MUSTANG 8X20X75 (BALLOONS) ×3
BALLOON MUSTANG 8X20X75 (BALLOONS) IMPLANT
CANISTER SUCT 3000ML PPV (MISCELLANEOUS) ×3 IMPLANT
CANNULA VESSEL 3MM 2 BLNT TIP (CANNULA) ×3 IMPLANT
CATH ANGIO 5F BER2 65CM (CATHETERS) ×2 IMPLANT
CATH ROBINSON RED A/P 18FR (CATHETERS) ×3 IMPLANT
CLIP VESOCCLUDE MED 24/CT (CLIP) ×3 IMPLANT
CLIP VESOCCLUDE SM WIDE 24/CT (CLIP) ×3 IMPLANT
CLIP VESOCCLUDE SM WIDE 6/CT (CLIP) ×2 IMPLANT
COVER DOME SNAP 22 D (MISCELLANEOUS) ×2 IMPLANT
CRADLE DONUT ADULT HEAD (MISCELLANEOUS) ×3 IMPLANT
DERMABOND ADVANCED (GAUZE/BANDAGES/DRESSINGS) ×2
DERMABOND ADVANCED .7 DNX12 (GAUZE/BANDAGES/DRESSINGS) ×1 IMPLANT
DRAIN CHANNEL 15F RND FF W/TCR (WOUND CARE) IMPLANT
DRAPE INCISE IOBAN 66X45 STRL (DRAPES) ×2 IMPLANT
ELECT REM PT RETURN 9FT ADLT (ELECTROSURGICAL) ×3
ELECTRODE REM PT RTRN 9FT ADLT (ELECTROSURGICAL) ×1 IMPLANT
EVACUATOR SILICONE 100CC (DRAIN) IMPLANT
GAUZE SPONGE 4X4 12PLY STRL (GAUZE/BANDAGES/DRESSINGS) ×3 IMPLANT
GLOVE BIO SURGEON STRL SZ 6.5 (GLOVE) ×1 IMPLANT
GLOVE BIO SURGEON STRL SZ7.5 (GLOVE) ×3 IMPLANT
GLOVE BIO SURGEONS STRL SZ 6.5 (GLOVE) ×1
GLOVE BIOGEL PI IND STRL 6.5 (GLOVE) IMPLANT
GLOVE BIOGEL PI IND STRL 8 (GLOVE) ×1 IMPLANT
GLOVE BIOGEL PI INDICATOR 6.5 (GLOVE) ×4
GLOVE BIOGEL PI INDICATOR 8 (GLOVE) ×2
GLOVE SURG SS PI 6.5 STRL IVOR (GLOVE) ×2 IMPLANT
GOWN STRL REUS W/ TWL LRG LVL3 (GOWN DISPOSABLE) ×3 IMPLANT
GOWN STRL REUS W/TWL LRG LVL3 (GOWN DISPOSABLE) ×9
KIT BASIN OR (CUSTOM PROCEDURE TRAY) ×3 IMPLANT
KIT ENCORE 26 ADVANTAGE (KITS) ×2 IMPLANT
KIT ROOM TURNOVER OR (KITS) ×3 IMPLANT
NDL 18GX1X1/2 (RX/OR ONLY) (NEEDLE) IMPLANT
NDL PERC 18GX7CM (NEEDLE) IMPLANT
NEEDLE 18GX1X1/2 (RX/OR ONLY) (NEEDLE) ×3 IMPLANT
NEEDLE 22X1 1/2 (OR ONLY) (NEEDLE) ×3 IMPLANT
NEEDLE PERC 18GX7CM (NEEDLE) ×3 IMPLANT
NS IRRIG 1000ML POUR BTL (IV SOLUTION) ×6 IMPLANT
PACK CV ACCESS (CUSTOM PROCEDURE TRAY) ×2 IMPLANT
PAD ARMBOARD 7.5X6 YLW CONV (MISCELLANEOUS) ×6 IMPLANT
SHEATH PINNACLE 5F 10CM (SHEATH) ×2 IMPLANT
SHEATH PINNACLE ST 7F 45CM (SHEATH) ×2 IMPLANT
SHUNT CAROTID BYPASS 10 (VASCULAR PRODUCTS) IMPLANT
SHUNT CAROTID BYPASS 12 (VASCULAR PRODUCTS) IMPLANT
SPONGE SURGIFOAM ABS GEL 100 (HEMOSTASIS) IMPLANT
STENT VIABAHNBX 7X29X135 (Permanent Stent) ×2 IMPLANT
STOPCOCK MORSE 400PSI 3WAY (MISCELLANEOUS) ×2 IMPLANT
SUT PROLENE 6 0 BV (SUTURE) ×3 IMPLANT
SUT PROLENE 7 0 BV 1 (SUTURE) IMPLANT
SUT SILK 2 0 PERMA HAND 18 BK (SUTURE) IMPLANT
SUT VIC AB 3-0 SH 27 (SUTURE) ×3
SUT VIC AB 3-0 SH 27X BRD (SUTURE) ×1 IMPLANT
SUT VICRYL 4-0 PS2 18IN ABS (SUTURE) ×3 IMPLANT
SYR CONTROL 10ML LL (SYRINGE) ×3 IMPLANT
SYRINGE 3CC LL L/F (MISCELLANEOUS) ×2 IMPLANT
TOWEL GREEN STERILE (TOWEL DISPOSABLE) ×3 IMPLANT
TRAY FOLEY W/METER SILVER 16FR (SET/KITS/TRAYS/PACK) ×1 IMPLANT
TUBING HIGH PRESSURE 120CM (CONNECTOR) ×2 IMPLANT
WATER STERILE IRR 1000ML POUR (IV SOLUTION) ×3 IMPLANT
WIRE BENTSON .035X145CM (WIRE) ×2 IMPLANT
WIRE ROSEN-J .035X260CM (WIRE) ×2 IMPLANT

## 2017-07-23 NOTE — OR Nursing (Signed)
Patient's sister at bedside and stated patient normally has headaches and takes excedrine for it.

## 2017-07-23 NOTE — Anesthesia Procedure Notes (Signed)
Procedure Name: Intubation Date/Time: 07/23/2017 8:31 AM Performed by: Barrington Ellison, CRNA Pre-anesthesia Checklist: Patient identified, Emergency Drugs available, Suction available and Patient being monitored Patient Re-evaluated:Patient Re-evaluated prior to induction Oxygen Delivery Method: Circle System Utilized Preoxygenation: Pre-oxygenation with 100% oxygen Induction Type: IV induction Ventilation: Mask ventilation without difficulty Laryngoscope Size: Mac and 4 Grade View: Grade I Tube type: Oral Tube size: 7.5 mm Number of attempts: 1 Airway Equipment and Method: Stylet and Oral airway Placement Confirmation: ETT inserted through vocal cords under direct vision,  positive ETCO2 and breath sounds checked- equal and bilateral Secured at: 22 cm Tube secured with: Tape Dental Injury: Teeth and Oropharynx as per pre-operative assessment

## 2017-07-23 NOTE — OR Nursing (Signed)
[name] arrived to PACU from the OR on a bed.  Medical devices attached.  Patient answers questions appropriately and denies pain.  Left arm is cool to the touch.  Left arm is wrapped in a blanket to warm.  Left radial and ulnar pulses are doppled.  Patient confirms sensation and denies numbness, tingling or pain to left hand.  Dr. Edilia Boickson is aware.

## 2017-07-23 NOTE — Progress Notes (Signed)
Pt received from PACU. Pt's sister at bedside. Telemetry applied, CCMD notified. CHG bath complete. Grips present, moderate bilaterally. Pt alert to self, place, time, situation. L brachial site clean, dry, intact. Call bell within reach, will continue to monitor.  Leonidas Rombergaitlin S Bumbledare, RN

## 2017-07-23 NOTE — Interval H&P Note (Signed)
History and Physical Interval Note:  07/23/2017 7:17 AM  Jordan Gardner  has presented today for surgery, with the diagnosis of left subclavian artery stenosis  The various methods of treatment have been discussed with the patient and family. After consideration of risks, benefits and other options for treatment, the patient has consented to  Procedure(s): BYPASS SUBCLAVIAN ARTERY VIABAHN GORE STENT GRAFT (Left) as a surgical intervention .  The patient's history has been reviewed, patient examined, no change in status, stable for surgery.  I have reviewed the patient's chart and labs.  Questions were answered to the patient's satisfaction.  I have explained that we have decided that a left brachial artery approach would be safer. I have also discussed the case again with Dr. Excell Seltzerooper. He agrees that this is the best approach for this complicated problem. He clearly is at increased risk given his CAD, but would be at higher risk if we did not address the tight left SCA stenosis.    [name]

## 2017-07-23 NOTE — Op Note (Signed)
   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 Edilia Boickson, MD, FACS  ANESTHESIA: General  EBL: Minimal  INDICATIONS:    [name] is a 66 y.o. male he would presented with the chest pain.  He underwent cardiac catheterization was found to have a tight left subclavian artery stenosis proximal to his LIMA graft.  I recommended angioplasty and stenting of the left subclavian artery.  FINDINGS:   Successful angioplasty and stenting of the left subclavian artery with no residual stenosis  TECHNIQUE:   The patient was taken to the operating room and received a general anesthetic.  A longitudinal incision was made over the brachial artery above the antecubital level.  Here the artery was dissected free and controlled with a vessel loop.  The artery was cannulated and guidewire introduced into the subclavian artery.  A long 7 French sheath used for support and I was able to advance the wire through the stenosis.  Then advanced the sheath using the dilator just beyond the stenosis.  Claimant arteriogram was obtained which demonstrated stenosis at the level of the arch and the level of the vertebral artery  Measurements were obtained and I selected a 7 mm x 29 mm VBX stent.  The patient was then heparinized.  I then advanced the sheath through the stenosis.  Stent was positioned across the stenosis slightly into the arch and was then retracted.  The wound was then inflated to nominal pressure and released.  Film showed a slight blush at the distal stent suggesting that there was not a perfect apposition at this level.  For this reason I went back with an  8 mm x 2 cm balloon which was positioned within the stent distally and inflated to nominal pressure.  Completion film showed resolution of the abnormality.  No residual stenosis in the subclavian artery.  Patient tolerated this well.  Wire was then removed.  Sheath was then removed and the artery clamped proximally distally.  The hole was repaired with a 6-0 Prolene suture.  At the completion was an excellent Doppler signal in the brachial artery.  Stasis was obtained and the wound wound was closed with 2 deep layers of 3-0 Vicryl and the skin closed with 4-0 Vicryl.  Wound was applied.  [name], MD, FACS [facility]  DATE OF DICTATION:   07/23/2017

## 2017-07-23 NOTE — Anesthesia Procedure Notes (Signed)
Arterial Line Insertion Start/End12/01/2017 7:00 AM, 07/23/2017 7:03 AM Performed by: De Nurseennie, Demarus Latterell E, CRNA, CRNA  Patient location: Pre-op. Right, radial was placed Catheter size: 20 G Hand hygiene performed  and maximum sterile barriers used  Allen's test indicative of satisfactory collateral circulation Attempts: 1 Procedure performed without using ultrasound guided technique. Following insertion, dressing applied and Biopatch. Post procedure assessment: normal

## 2017-07-23 NOTE — Transfer of Care (Signed)
Immediate Anesthesia Transfer of Care Note  Patient: Jordan Gardner  Procedure(s) Performed: BYPASS SUBCLAVIAN ARTERY  USING VIABAHN GORE STENT GRAFT (Left Arm Upper)  Patient Location: PACU  Anesthesia Type:General  Level of Consciousness: drowsy and patient cooperative  Airway & Oxygen Therapy: Patient Spontanous Breathing and Patient connected to nasal cannula oxygen  Post-op Assessment: Report given to RN  Post vital signs: Reviewed and stable  Last Vitals:  Vitals:   07/23/17 0559 07/23/17 0954  BP: (!) 161/65 139/71  Pulse: 63 65  Resp: 16 20  Temp: 36.9 C 36.5 C  SpO2: 97% 91%    Last Pain:  Vitals:   07/23/17 0701  TempSrc:   PainSc: 6       Patients Stated Pain Goal: 3 (07/23/17 0701)  Complications: No apparent anesthesia complications

## 2017-07-24 ENCOUNTER — Encounter (HOSPITAL_COMMUNITY): Payer: Self-pay | Admitting: Vascular Surgery

## 2017-07-24 LAB — BASIC METABOLIC PANEL
Anion gap: 8 (ref 5–15)
BUN: 21 mg/dL — AB (ref 6–20)
CALCIUM: 8.4 mg/dL — AB (ref 8.9–10.3)
CO2: 20 mmol/L — ABNORMAL LOW (ref 22–32)
CREATININE: 1.51 mg/dL — AB (ref 0.61–1.24)
Chloride: 107 mmol/L (ref 101–111)
GFR calc Af Amer: 54 mL/min — ABNORMAL LOW (ref >60)
GFR, EST NON AFRICAN AMERICAN: 47 mL/min — AB (ref >60)
Glucose, Bld: 182 mg/dL — ABNORMAL HIGH (ref 65–99)
POTASSIUM: 4.7 mmol/L (ref 3.5–5.1)
SODIUM: 135 mmol/L (ref 135–145)

## 2017-07-24 LAB — GLUCOSE, CAPILLARY
GLUCOSE-CAPILLARY: 169 mg/dL — AB (ref 65–99)
Glucose-Capillary: 246 mg/dL — ABNORMAL HIGH (ref 65–99)

## 2017-07-24 MED ORDER — OXYCODONE HCL 5 MG PO TABS: 5.0000 mg | ORAL_TABLET | ORAL | 0 refills | Status: DC | PRN

## 2017-07-24 NOTE — Anesthesia Postprocedure Evaluation (Signed)
Anesthesia Post Note  Patient: Jordan Gardner  Procedure(s) Performed: BYPASS SUBCLAVIAN ARTERY  USING VIABAHN GORE STENT GRAFT (Left Arm Upper)     Patient location during evaluation: PACU Anesthesia Type: General Level of consciousness: awake and alert Pain management: pain level controlled Vital Signs Assessment: post-procedure vital signs reviewed and stable Respiratory status: spontaneous breathing, nonlabored ventilation, respiratory function stable and patient connected to nasal cannula oxygen Cardiovascular status: blood pressure returned to baseline and stable Postop Assessment: no apparent nausea or vomiting Anesthetic complications: no    Last Vitals:  Vitals:   07/24/17 0744 07/24/17 1307  BP: (!) 142/72 (!) 143/79  Pulse: 67 95  Resp: 11 20  Temp: 36.8 C 36.7 C  SpO2: 97% 92%    Last Pain:  Vitals:   07/24/17 1307  TempSrc: Oral  PainSc:                  Valyncia Wiens

## 2017-07-24 NOTE — Progress Notes (Addendum)
[facility]  Subjective  - Left hand and arm feel OK.   Objective 136/79 68 98.6 F (37 C) (Oral) 19 90%  Intake/Output Summary (Last 24 hours) at 07/24/2017 0718 Last data filed at 07/24/2017 0600 Gross per 24 hour  Intake 2226 ml  Output 440 ml  Net 1786 ml   Left UE incision healing well Left hand warm with active range of motion and sensation intact   Assessment/Planning: POD # 1 Angioplasty and stenting of left subclavian artery with 7 X 29 VBX covered stent  He is on  Plavix F/U with Dr. Edilia Boickson in 2-3 weeks  Jordan Gardner 07/24/2017 7:18 AM --  Laboratory Lab Results: Recent Labs    07/23/17 0706  WBC 10.2  HGB 10.1*  HCT 30.8*  PLT 187   BMET Recent Labs    07/23/17 0706 07/24/17 0211  NA 137 135  K 4.0 4.7  CL 108 107  CO2 19* 20*  GLUCOSE 108* 182*  BUN 17 21*  CREATININE 1.21 1.51*  CALCIUM 8.4* 8.4*    COAG Lab Results  Component Value Date   INR 1.24 07/23/2017   INR 1.14 06/26/2017   INR 1.15 06/26/2017   No results found for: PTT  I have interviewed the patient and examined the patient. I agree with the findings by the PA. Home today.   Cari Carawayhris Anett Ranker, MD [phone]

## 2017-07-24 NOTE — Care Management Note (Signed)
Case Management Note  Patient Details  Name: Jordan Gardner MRN: [id number] Date of Birth: 09/06/1950  Subjective/Objective:     Status post      Angioplasty and stenting of left subclavian artery   Action/Plan: PTA PT lived at home. Sister to assist at discharge.  Notified by Elmarie Shileyiffany with [facility] Pt has pre-op referral for any HH needs.  Expected Discharge Date:  07/24/17               Expected Discharge Plan:  Home/Self Care  In-House Referral:  NA  Discharge planning Services  CM Consult  Post Acute Care Choice:  NA Choice offered to:  NA  DME Arranged:  N/A DME Agency:  NA  HH Arranged:  NA HH Agency:  NA  Status of Service:  Completed, signed off  If discussed at Long Length of Stay Meetings, dates discussed:    Additional Comments:  Yancey FlemingsKimberly R Ltanya Bayley, RN 07/24/2017, 10:33 AM

## 2017-07-24 NOTE — Progress Notes (Signed)
Pt/family given discharge instructions, medication lists, follow up appointments, and when to call the doctor.  Pt/family verbalizes understanding. Pt given signs and symptoms of infection. Skylen Spiering McClintock, RN    

## 2017-07-28 ENCOUNTER — Telehealth: Payer: Self-pay | Admitting: Vascular Surgery

## 2017-07-28 ENCOUNTER — Encounter (HOSPITAL_COMMUNITY): Payer: Self-pay | Admitting: Emergency Medicine

## 2017-07-28 ENCOUNTER — Emergency Department (HOSPITAL_COMMUNITY): Payer: Medicare HMO

## 2017-07-28 ENCOUNTER — Inpatient Hospital Stay (HOSPITAL_COMMUNITY): Payer: Medicare HMO

## 2017-07-28 ENCOUNTER — Other Ambulatory Visit: Payer: Self-pay

## 2017-07-28 ENCOUNTER — Inpatient Hospital Stay (HOSPITAL_COMMUNITY)
Admission: EM | Admit: 2017-07-28 | Discharge: 2017-08-04 | DRG: 208 | Disposition: A | Discharge status: Skilled Nursing Facility | Payer: Medicare HMO | Attending: Internal Medicine | Admitting: Internal Medicine

## 2017-07-28 DIAGNOSIS — G9341 Metabolic encephalopathy: Secondary | ICD-10-CM | POA: Diagnosis present

## 2017-07-28 DIAGNOSIS — I2510 Atherosclerotic heart disease of native coronary artery without angina pectoris: Secondary | ICD-10-CM | POA: Diagnosis present

## 2017-07-28 DIAGNOSIS — N179 Acute kidney failure, unspecified: Secondary | ICD-10-CM | POA: Diagnosis not present

## 2017-07-28 DIAGNOSIS — E876 Hypokalemia: Secondary | ICD-10-CM | POA: Diagnosis present

## 2017-07-28 DIAGNOSIS — R6250 Unspecified lack of expected normal physiological development in childhood: Secondary | ICD-10-CM | POA: Diagnosis present

## 2017-07-28 DIAGNOSIS — I252 Old myocardial infarction: Secondary | ICD-10-CM

## 2017-07-28 DIAGNOSIS — IMO0002 Reserved for concepts with insufficient information to code with codable children: Secondary | ICD-10-CM

## 2017-07-28 DIAGNOSIS — I5033 Acute on chronic diastolic (congestive) heart failure: Secondary | ICD-10-CM | POA: Diagnosis present

## 2017-07-28 DIAGNOSIS — F17210 Nicotine dependence, cigarettes, uncomplicated: Secondary | ICD-10-CM | POA: Diagnosis present

## 2017-07-28 DIAGNOSIS — J45909 Unspecified asthma, uncomplicated: Secondary | ICD-10-CM | POA: Diagnosis present

## 2017-07-28 DIAGNOSIS — E1165 Type 2 diabetes mellitus with hyperglycemia: Secondary | ICD-10-CM | POA: Diagnosis not present

## 2017-07-28 DIAGNOSIS — Z951 Presence of aortocoronary bypass graft: Secondary | ICD-10-CM

## 2017-07-28 DIAGNOSIS — E872 Acidosis, unspecified: Secondary | ICD-10-CM

## 2017-07-28 DIAGNOSIS — J9601 Acute respiratory failure with hypoxia: Secondary | ICD-10-CM | POA: Diagnosis present

## 2017-07-28 DIAGNOSIS — I130 Hypertensive heart and chronic kidney disease with heart failure and stage 1 through stage 4 chronic kidney disease, or unspecified chronic kidney disease: Secondary | ICD-10-CM | POA: Diagnosis present

## 2017-07-28 DIAGNOSIS — Z7984 Long term (current) use of oral hypoglycemic drugs: Secondary | ICD-10-CM

## 2017-07-28 DIAGNOSIS — J9809 Other diseases of bronchus, not elsewhere classified: Secondary | ICD-10-CM | POA: Diagnosis present

## 2017-07-28 DIAGNOSIS — G629 Polyneuropathy, unspecified: Secondary | ICD-10-CM | POA: Diagnosis not present

## 2017-07-28 DIAGNOSIS — E43 Unspecified severe protein-calorie malnutrition: Secondary | ICD-10-CM | POA: Diagnosis present

## 2017-07-28 DIAGNOSIS — Z9911 Dependence on respirator [ventilator] status: Secondary | ICD-10-CM | POA: Diagnosis not present

## 2017-07-28 DIAGNOSIS — R918 Other nonspecific abnormal finding of lung field: Secondary | ICD-10-CM | POA: Diagnosis not present

## 2017-07-28 DIAGNOSIS — E1122 Type 2 diabetes mellitus with diabetic chronic kidney disease: Secondary | ICD-10-CM | POA: Diagnosis present

## 2017-07-28 DIAGNOSIS — F419 Anxiety disorder, unspecified: Secondary | ICD-10-CM | POA: Diagnosis present

## 2017-07-28 DIAGNOSIS — Z79891 Long term (current) use of opiate analgesic: Secondary | ICD-10-CM

## 2017-07-28 DIAGNOSIS — R740 Nonspecific elevation of levels of transaminase and lactic acid dehydrogenase [LDH]: Secondary | ICD-10-CM | POA: Diagnosis present

## 2017-07-28 DIAGNOSIS — Z8249 Family history of ischemic heart disease and other diseases of the circulatory system: Secondary | ICD-10-CM

## 2017-07-28 DIAGNOSIS — J381 Polyp of vocal cord and larynx: Secondary | ICD-10-CM | POA: Diagnosis present

## 2017-07-28 DIAGNOSIS — D6489 Other specified anemias: Secondary | ICD-10-CM | POA: Diagnosis present

## 2017-07-28 DIAGNOSIS — Z7982 Long term (current) use of aspirin: Secondary | ICD-10-CM

## 2017-07-28 DIAGNOSIS — I10 Essential (primary) hypertension: Secondary | ICD-10-CM | POA: Diagnosis not present

## 2017-07-28 DIAGNOSIS — Z79899 Other long term (current) drug therapy: Secondary | ICD-10-CM

## 2017-07-28 DIAGNOSIS — E1151 Type 2 diabetes mellitus with diabetic peripheral angiopathy without gangrene: Secondary | ICD-10-CM | POA: Diagnosis present

## 2017-07-28 DIAGNOSIS — N182 Chronic kidney disease, stage 2 (mild): Secondary | ICD-10-CM | POA: Diagnosis present

## 2017-07-28 DIAGNOSIS — J189 Pneumonia, unspecified organism: Principal | ICD-10-CM | POA: Diagnosis present

## 2017-07-28 DIAGNOSIS — Z9289 Personal history of other medical treatment: Secondary | ICD-10-CM

## 2017-07-28 DIAGNOSIS — Z833 Family history of diabetes mellitus: Secondary | ICD-10-CM | POA: Diagnosis not present

## 2017-07-28 DIAGNOSIS — M1990 Unspecified osteoarthritis, unspecified site: Secondary | ICD-10-CM | POA: Diagnosis present

## 2017-07-28 DIAGNOSIS — J810 Acute pulmonary edema: Secondary | ICD-10-CM | POA: Diagnosis not present

## 2017-07-28 DIAGNOSIS — D638 Anemia in other chronic diseases classified elsewhere: Secondary | ICD-10-CM | POA: Diagnosis present

## 2017-07-28 DIAGNOSIS — F329 Major depressive disorder, single episode, unspecified: Secondary | ICD-10-CM | POA: Diagnosis present

## 2017-07-28 DIAGNOSIS — Z6823 Body mass index (BMI) 23.0-23.9, adult: Secondary | ICD-10-CM

## 2017-07-28 DIAGNOSIS — E785 Hyperlipidemia, unspecified: Secondary | ICD-10-CM | POA: Diagnosis present

## 2017-07-28 DIAGNOSIS — Z8673 Personal history of transient ischemic attack (TIA), and cerebral infarction without residual deficits: Secondary | ICD-10-CM

## 2017-07-28 DIAGNOSIS — G9340 Encephalopathy, unspecified: Secondary | ICD-10-CM | POA: Diagnosis not present

## 2017-07-28 DIAGNOSIS — I712 Thoracic aortic aneurysm, without rupture: Secondary | ICD-10-CM | POA: Diagnosis present

## 2017-07-28 DIAGNOSIS — N170 Acute kidney failure with tubular necrosis: Secondary | ICD-10-CM | POA: Diagnosis present

## 2017-07-28 DIAGNOSIS — Z4659 Encounter for fitting and adjustment of other gastrointestinal appliance and device: Secondary | ICD-10-CM

## 2017-07-28 DIAGNOSIS — G43909 Migraine, unspecified, not intractable, without status migrainosus: Secondary | ICD-10-CM | POA: Diagnosis present

## 2017-07-28 DIAGNOSIS — E1142 Type 2 diabetes mellitus with diabetic polyneuropathy: Secondary | ICD-10-CM | POA: Diagnosis present

## 2017-07-28 DIAGNOSIS — I781 Nevus, non-neoplastic: Secondary | ICD-10-CM | POA: Diagnosis present

## 2017-07-28 DIAGNOSIS — Z955 Presence of coronary angioplasty implant and graft: Secondary | ICD-10-CM

## 2017-07-28 DIAGNOSIS — Z7902 Long term (current) use of antithrombotics/antiplatelets: Secondary | ICD-10-CM

## 2017-07-28 DIAGNOSIS — I998 Other disorder of circulatory system: Secondary | ICD-10-CM | POA: Diagnosis present

## 2017-07-28 DIAGNOSIS — E875 Hyperkalemia: Secondary | ICD-10-CM | POA: Diagnosis present

## 2017-07-28 DIAGNOSIS — N189 Chronic kidney disease, unspecified: Secondary | ICD-10-CM | POA: Diagnosis not present

## 2017-07-28 DIAGNOSIS — I701 Atherosclerosis of renal artery: Secondary | ICD-10-CM | POA: Diagnosis present

## 2017-07-28 LAB — LIPASE, BLOOD: Lipase: 16 U/L (ref 11–51)

## 2017-07-28 LAB — URINALYSIS, ROUTINE W REFLEX MICROSCOPIC
Bilirubin Urine: NEGATIVE
GLUCOSE, UA: NEGATIVE mg/dL
Hgb urine dipstick: NEGATIVE
Ketones, ur: NEGATIVE mg/dL
Leukocytes, UA: NEGATIVE
Nitrite: NEGATIVE
Specific Gravity, Urine: 1.026 (ref 1.005–1.030)
pH: 5 (ref 5.0–8.0)

## 2017-07-28 LAB — TRIGLYCERIDES: TRIGLYCERIDES: 85 mg/dL (ref <150)

## 2017-07-28 LAB — CBC WITH DIFFERENTIAL/PLATELET
Basophils Absolute: 0.1 10*3/uL (ref 0.0–0.1)
Basophils Relative: 1 %
EOS ABS: 0.1 10*3/uL (ref 0.0–0.7)
EOS PCT: 1 %
HCT: 34.2 % — ABNORMAL LOW (ref 39.0–52.0)
Hemoglobin: 11.1 g/dL — ABNORMAL LOW (ref 13.0–17.0)
LYMPHS ABS: 1.6 10*3/uL (ref 0.7–4.0)
Lymphocytes Relative: 15 %
MCH: 27.2 pg (ref 26.0–34.0)
MCHC: 32.5 g/dL (ref 30.0–36.0)
MCV: 83.8 fL (ref 78.0–100.0)
MONOS PCT: 4 %
Monocytes Absolute: 0.4 10*3/uL (ref 0.1–1.0)
Neutro Abs: 8.4 10*3/uL — ABNORMAL HIGH (ref 1.7–7.7)
Neutrophils Relative %: 79 %
PLATELETS: 271 10*3/uL (ref 150–400)
RBC: 4.08 MIL/uL — ABNORMAL LOW (ref 4.22–5.81)
RDW: 14.4 % (ref 11.5–15.5)
WBC: 10.5 10*3/uL (ref 4.0–10.5)

## 2017-07-28 LAB — I-STAT CHEM 8, ED
BUN: 29 mg/dL — AB (ref 6–20)
CREATININE: 1.4 mg/dL — AB (ref 0.61–1.24)
Calcium, Ion: 1.13 mmol/L — ABNORMAL LOW (ref 1.15–1.40)
Chloride: 110 mmol/L (ref 101–111)
Glucose, Bld: 241 mg/dL — ABNORMAL HIGH (ref 65–99)
HEMATOCRIT: 35 % — AB (ref 39.0–52.0)
Hemoglobin: 11.9 g/dL — ABNORMAL LOW (ref 13.0–17.0)
Potassium: 5.7 mmol/L — ABNORMAL HIGH (ref 3.5–5.1)
Sodium: 138 mmol/L (ref 135–145)
TCO2: 16 mmol/L — ABNORMAL LOW (ref 22–32)

## 2017-07-28 LAB — BODY FLUID CELL COUNT WITH DIFFERENTIAL
EOS FL: 2 %
Lymphs, Fluid: 5 %
MONOCYTE-MACROPHAGE-SEROUS FLUID: 49 % — AB (ref 50–90)
Neutrophil Count, Fluid: 44 % — ABNORMAL HIGH (ref 0–25)
WBC FLUID: 305 uL (ref 0–1000)

## 2017-07-28 LAB — I-STAT ARTERIAL BLOOD GAS, ED
Acid-base deficit: 12 mmol/L — ABNORMAL HIGH (ref 0.0–2.0)
BICARBONATE: 12.4 mmol/L — AB (ref 20.0–28.0)
O2 Saturation: 93 %
PO2 ART: 67 mmHg — AB (ref 83.0–108.0)
TCO2: 13 mmol/L — AB (ref 22–32)
pCO2 arterial: 24.2 mmHg — ABNORMAL LOW (ref 32.0–48.0)
pH, Arterial: 7.314 — ABNORMAL LOW (ref 7.350–7.450)

## 2017-07-28 LAB — POCT I-STAT 3, ART BLOOD GAS (G3+)
Acid-base deficit: 5 mmol/L — ABNORMAL HIGH (ref 0.0–2.0)
Bicarbonate: 18.8 mmol/L — ABNORMAL LOW (ref 20.0–28.0)
O2 Saturation: 100 %
PCO2 ART: 30.4 mmHg — AB (ref 32.0–48.0)
PH ART: 7.398 (ref 7.350–7.450)
TCO2: 20 mmol/L — ABNORMAL LOW (ref 22–32)
pO2, Arterial: 277 mmHg — ABNORMAL HIGH (ref 83.0–108.0)

## 2017-07-28 LAB — LACTIC ACID, PLASMA
Lactic Acid, Venous: 3.2 mmol/L (ref 0.5–1.9)
Lactic Acid, Venous: 2.6 mmol/L (ref 0.5–1.9)

## 2017-07-28 LAB — GLUCOSE, CAPILLARY
GLUCOSE-CAPILLARY: 188 mg/dL — AB (ref 65–99)
GLUCOSE-CAPILLARY: 161 mg/dL — AB (ref 65–99)
GLUCOSE-CAPILLARY: 98 mg/dL (ref 65–99)
Glucose-Capillary: 117 mg/dL — ABNORMAL HIGH (ref 65–99)

## 2017-07-28 LAB — COMPREHENSIVE METABOLIC PANEL
ALT: 91 U/L — ABNORMAL HIGH (ref 17–63)
ANION GAP: 17 — AB (ref 5–15)
AST: 94 U/L — ABNORMAL HIGH (ref 15–41)
Albumin: 3.3 g/dL — ABNORMAL LOW (ref 3.5–5.0)
Alkaline Phosphatase: 119 U/L (ref 38–126)
BUN: 23 mg/dL — ABNORMAL HIGH (ref 6–20)
CHLORIDE: 106 mmol/L (ref 101–111)
CO2: 13 mmol/L — AB (ref 22–32)
Calcium: 9.1 mg/dL (ref 8.9–10.3)
Creatinine, Ser: 1.73 mg/dL — ABNORMAL HIGH (ref 0.61–1.24)
GFR calc non Af Amer: 40 mL/min — ABNORMAL LOW (ref >60)
GFR, EST AFRICAN AMERICAN: 46 mL/min — AB (ref >60)
Glucose, Bld: 241 mg/dL — ABNORMAL HIGH (ref 65–99)
Potassium: 5.6 mmol/L — ABNORMAL HIGH (ref 3.5–5.1)
SODIUM: 136 mmol/L (ref 135–145)
Total Bilirubin: 0.8 mg/dL (ref 0.3–1.2)
Total Protein: 6.5 g/dL (ref 6.5–8.1)

## 2017-07-28 LAB — I-STAT CG4 LACTIC ACID, ED
Lactic Acid, Venous: 7.82 mmol/L (ref 0.5–1.9)
Lactic Acid, Venous: 4.72 mmol/L (ref 0.5–1.9)

## 2017-07-28 LAB — HEMOGLOBIN A1C
Hgb A1c MFr Bld: 7.1 % — ABNORMAL HIGH (ref 4.8–5.6)
Mean Plasma Glucose: 157.07 mg/dL

## 2017-07-28 LAB — PROCALCITONIN

## 2017-07-28 LAB — BRAIN NATRIURETIC PEPTIDE: B NATRIURETIC PEPTIDE 5: 3888.5 pg/mL — AB (ref 0.0–100.0)

## 2017-07-28 LAB — CBG MONITORING, ED: Glucose-Capillary: 234 mg/dL — ABNORMAL HIGH (ref 65–99)

## 2017-07-28 LAB — MRSA PCR SCREENING: MRSA by PCR: NEGATIVE

## 2017-07-28 LAB — APTT: aPTT: 33 s (ref 24–36)

## 2017-07-28 LAB — PROTIME-INR
INR: 1.35
Prothrombin Time: 16.5 s — ABNORMAL HIGH (ref 11.4–15.2)

## 2017-07-28 LAB — I-STAT TROPONIN, ED: Troponin i, poc: 0.09 ng/mL (ref 0.00–0.08)

## 2017-07-28 LAB — TROPONIN I: TROPONIN I: 0.08 ng/mL — AB (ref <0.03)

## 2017-07-28 MED ORDER — METOPROLOL TARTRATE 25 MG PO TABS
50.0000 mg | ORAL_TABLET | Freq: Two times a day (BID) | ORAL | Status: DC
  Administered 2017-07-29 – 2017-07-30 (×3): 50 mg
  Filled 2017-07-28 (×6): qty 2

## 2017-07-28 MED ORDER — INSULIN ASPART 100 UNIT/ML ~~LOC~~ SOLN
2.0000 [IU] | SUBCUTANEOUS | Status: DC
  Administered 2017-07-28: 4 [IU] via SUBCUTANEOUS
  Administered 2017-07-29: 2 [IU] via SUBCUTANEOUS
  Administered 2017-07-30: 4 [IU] via SUBCUTANEOUS
  Administered 2017-07-30: 6 [IU] via SUBCUTANEOUS
  Administered 2017-07-30 (×2): 2 [IU] via SUBCUTANEOUS

## 2017-07-28 MED ORDER — FENTANYL 2500MCG IN NS 250ML (10MCG/ML) PREMIX INFUSION
25.0000 ug/h | INTRAVENOUS | Status: DC
  Administered 2017-07-28: 50 ug/h via INTRAVENOUS
  Administered 2017-07-29: 100 ug/h via INTRAVENOUS
  Filled 2017-07-28 (×3): qty 250

## 2017-07-28 MED ORDER — ROCURONIUM BROMIDE 50 MG/5ML IV SOLN
INTRAVENOUS | Status: AC | PRN
  Administered 2017-07-28: 81.2 mg via INTRAVENOUS

## 2017-07-28 MED ORDER — FENTANYL BOLUS VIA INFUSION
50.0000 ug | INTRAVENOUS | Status: DC | PRN
  Administered 2017-07-29 – 2017-07-30 (×5): 50 ug via INTRAVENOUS
  Filled 2017-07-28: qty 50

## 2017-07-28 MED ORDER — ETOMIDATE 2 MG/ML IV SOLN: 20.0000 mg | Freq: Once | INTRAVENOUS | Status: DC

## 2017-07-28 MED ORDER — NITROGLYCERIN IN D5W 200-5 MCG/ML-% IV SOLN
0.0000 ug/min | INTRAVENOUS | Status: DC
  Administered 2017-07-28: 5 ug/min via INTRAVENOUS
  Filled 2017-07-28: qty 250

## 2017-07-28 MED ORDER — FUROSEMIDE 10 MG/ML IJ SOLN
60.0000 mg | Freq: Two times a day (BID) | INTRAMUSCULAR | Status: DC
  Administered 2017-07-28 – 2017-07-29 (×3): 60 mg via INTRAVENOUS
  Filled 2017-07-28 (×4): qty 6

## 2017-07-28 MED ORDER — INSULIN ASPART 100 UNIT/ML ~~LOC~~ SOLN: 0.0000 [IU] | Freq: Three times a day (TID) | SUBCUTANEOUS | Status: DC

## 2017-07-28 MED ORDER — ROCURONIUM BROMIDE 50 MG/5ML IV SOLN
1.0000 mg/kg | Freq: Once | INTRAVENOUS | Status: DC
  Filled 2017-07-28: qty 8.12

## 2017-07-28 MED ORDER — SODIUM CHLORIDE 0.9 % IV BOLUS (SEPSIS): 1000.0000 mL | Freq: Once | INTRAVENOUS | Status: DC

## 2017-07-28 MED ORDER — ISOSORBIDE MONONITRATE ER 30 MG PO TB24: 60.0000 mg | ORAL_TABLET | Freq: Every day | ORAL | Status: DC

## 2017-07-28 MED ORDER — PIPERACILLIN-TAZOBACTAM 3.375 G IVPB
3.3750 g | Freq: Three times a day (TID) | INTRAVENOUS | Status: DC
  Administered 2017-07-28 – 2017-08-02 (×15): 3.375 g via INTRAVENOUS
  Filled 2017-07-28 (×18): qty 50

## 2017-07-28 MED ORDER — FUROSEMIDE 10 MG/ML IJ SOLN
60.0000 mg | Freq: Once | INTRAMUSCULAR | Status: AC
  Administered 2017-07-28: 60 mg via INTRAVENOUS
  Filled 2017-07-28: qty 6

## 2017-07-28 MED ORDER — ASPIRIN 81 MG PO CHEW
81.0000 mg | CHEWABLE_TABLET | Freq: Every day | ORAL | Status: DC
  Administered 2017-07-29 – 2017-07-30 (×2): 81 mg
  Filled 2017-07-28 (×2): qty 1

## 2017-07-28 MED ORDER — PROPOFOL 1000 MG/100ML IV EMUL
0.0000 ug/kg/min | INTRAVENOUS | Status: DC
  Administered 2017-07-28: 10 ug/kg/min via INTRAVENOUS

## 2017-07-28 MED ORDER — PANTOPRAZOLE SODIUM 40 MG PO PACK
40.0000 mg | PACK | Freq: Every day | ORAL | Status: DC
  Administered 2017-07-28 – 2017-07-30 (×3): 40 mg
  Filled 2017-07-28 (×4): qty 20

## 2017-07-28 MED ORDER — FENTANYL CITRATE (PF) 100 MCG/2ML IJ SOLN: 50.0000 ug | INTRAMUSCULAR | Status: DC | PRN

## 2017-07-28 MED ORDER — SODIUM CHLORIDE 0.9 % IV BOLUS (SEPSIS)
1000.0000 mL | Freq: Once | INTRAVENOUS | Status: AC
  Administered 2017-07-28: 1000 mL via INTRAVENOUS

## 2017-07-28 MED ORDER — SERTRALINE HCL 100 MG PO TABS
100.0000 mg | ORAL_TABLET | Freq: Every day | ORAL | Status: DC
  Administered 2017-07-29 – 2017-07-30 (×2): 100 mg
  Filled 2017-07-28 (×3): qty 1

## 2017-07-28 MED ORDER — ATORVASTATIN CALCIUM 80 MG PO TABS
80.0000 mg | ORAL_TABLET | Freq: Every day | ORAL | Status: DC
  Administered 2017-07-28 – 2017-07-30 (×3): 80 mg
  Filled 2017-07-28 (×3): qty 1

## 2017-07-28 MED ORDER — ASPIRIN 81 MG PO CHEW
324.0000 mg | CHEWABLE_TABLET | Freq: Once | ORAL | Status: DC
  Filled 2017-07-28: qty 4

## 2017-07-28 MED ORDER — ADULT MULTIVITAMIN W/MINERALS CH: 1.0000 | ORAL_TABLET | Freq: Every day | ORAL | Status: DC

## 2017-07-28 MED ORDER — NICOTINE 14 MG/24HR TD PT24
14.0000 mg | MEDICATED_PATCH | Freq: Every day | TRANSDERMAL | Status: DC
  Administered 2017-07-29: 14 mg via TRANSDERMAL
  Filled 2017-07-28: qty 1

## 2017-07-28 MED ORDER — PIPERACILLIN-TAZOBACTAM 3.375 G IVPB 30 MIN
3.3750 g | Freq: Once | INTRAVENOUS | Status: AC
  Administered 2017-07-28: 3.375 g via INTRAVENOUS
  Filled 2017-07-28: qty 50

## 2017-07-28 MED ORDER — MIRTAZAPINE 15 MG PO TABS
15.0000 mg | ORAL_TABLET | Freq: Every day | ORAL | Status: DC
  Administered 2017-07-28 – 2017-07-30 (×3): 15 mg
  Filled 2017-07-28 (×4): qty 1

## 2017-07-28 MED ORDER — DOCUSATE SODIUM 50 MG/5ML PO LIQD: 100.0000 mg | Freq: Two times a day (BID) | ORAL | Status: DC | PRN

## 2017-07-28 MED ORDER — FENTANYL CITRATE (PF) 100 MCG/2ML IJ SOLN
50.0000 ug | Freq: Once | INTRAMUSCULAR | Status: AC
  Administered 2017-07-28: 50 ug via INTRAVENOUS

## 2017-07-28 MED ORDER — VANCOMYCIN HCL IN DEXTROSE 1-5 GM/200ML-% IV SOLN: 1000.0000 mg | Freq: Once | INTRAVENOUS | Status: DC

## 2017-07-28 MED ORDER — NITROGLYCERIN 0.4 MG SL SUBL
0.4000 mg | SUBLINGUAL_TABLET | SUBLINGUAL | Status: DC | PRN
  Administered 2017-07-28: 0.4 mg via SUBLINGUAL
  Filled 2017-07-28: qty 1

## 2017-07-28 MED ORDER — CHLORHEXIDINE GLUCONATE 0.12% ORAL RINSE (MEDLINE KIT)
15.0000 mL | Freq: Two times a day (BID) | OROMUCOSAL | Status: DC
  Administered 2017-07-28 – 2017-07-30 (×5): 15 mL via OROMUCOSAL

## 2017-07-28 MED ORDER — CLOPIDOGREL BISULFATE 75 MG PO TABS
75.0000 mg | ORAL_TABLET | Freq: Every day | ORAL | Status: DC
  Administered 2017-07-29 – 2017-07-30 (×2): 75 mg
  Filled 2017-07-28 (×3): qty 1

## 2017-07-28 MED ORDER — VANCOMYCIN HCL IN DEXTROSE 750-5 MG/150ML-% IV SOLN
750.0000 mg | Freq: Two times a day (BID) | INTRAVENOUS | Status: DC
  Administered 2017-07-28 – 2017-07-29 (×3): 750 mg via INTRAVENOUS
  Filled 2017-07-28 (×4): qty 150

## 2017-07-28 MED ORDER — FENTANYL CITRATE (PF) 100 MCG/2ML IJ SOLN: 100.0000 ug | INTRAMUSCULAR | Status: DC | PRN

## 2017-07-28 MED ORDER — MIDAZOLAM HCL 2 MG/2ML IJ SOLN
4.0000 mg | Freq: Once | INTRAMUSCULAR | Status: AC
  Administered 2017-07-28: 4 mg via INTRAVENOUS

## 2017-07-28 MED ORDER — SODIUM CHLORIDE 0.9 % IV SOLN: 250.0000 mL | INTRAVENOUS | Status: DC | PRN

## 2017-07-28 MED ORDER — SODIUM BICARBONATE 8.4 % IV SOLN
INTRAVENOUS | Status: AC | PRN
  Administered 2017-07-28: 100 meq via INTRAVENOUS

## 2017-07-28 MED ORDER — ETOMIDATE 2 MG/ML IV SOLN
INTRAVENOUS | Status: AC | PRN
  Administered 2017-07-28: 24 mg via INTRAVENOUS

## 2017-07-28 MED ORDER — ORAL CARE MOUTH RINSE
15.0000 mL | Freq: Four times a day (QID) | OROMUCOSAL | Status: DC
  Administered 2017-07-28 – 2017-07-30 (×10): 15 mL via OROMUCOSAL

## 2017-07-28 MED ORDER — SODIUM BICARBONATE 8.4 % IV SOLN: 50.0000 meq | Freq: Once | INTRAVENOUS | Status: DC

## 2017-07-28 MED ORDER — MIDAZOLAM HCL 2 MG/2ML IJ SOLN
INTRAMUSCULAR | Status: AC
  Administered 2017-07-28: 4 mg via INTRAVENOUS
  Filled 2017-07-28: qty 6

## 2017-07-28 MED ORDER — FENTANYL CITRATE (PF) 100 MCG/2ML IJ SOLN
50.0000 ug | INTRAMUSCULAR | Status: DC | PRN
  Administered 2017-07-28: 50 ug via INTRAVENOUS
  Filled 2017-07-28: qty 2

## 2017-07-28 MED ORDER — VANCOMYCIN HCL 10 G IV SOLR
1500.0000 mg | Freq: Once | INTRAVENOUS | Status: AC
  Administered 2017-07-28: 1500 mg via INTRAVENOUS
  Filled 2017-07-28: qty 1500

## 2017-07-28 MED ORDER — ENOXAPARIN SODIUM 40 MG/0.4ML ~~LOC~~ SOLN
40.0000 mg | SUBCUTANEOUS | Status: DC
  Administered 2017-07-28 – 2017-08-03 (×7): 40 mg via SUBCUTANEOUS
  Filled 2017-07-28 (×7): qty 0.4

## 2017-07-28 NOTE — ED Notes (Signed)
Per CCM-- wean off propofol, use fentanyl for pain control/sedation.

## 2017-07-28 NOTE — ED Notes (Signed)
States chest "stinging" is better-- pt on bipap at present, resp at bedside.

## 2017-07-28 NOTE — H&P (Addendum)
PULMONARY / CRITICAL CARE MEDICINE   Name: Jordan Gardner MRN: [id number] DOB: [birth year]    ADMISSION DATE:  07/28/2017 CONSULTATION DATE: 07/28/17  REFERRING MD:   Duffy Bruce  CHIEF COMPLAINT:  Shortness of breath  HISTORY OF PRESENT ILLNESS:   66 year old male history of coronary artery disease status post recent NSTEMI with stent placement 06/2017, hypertension, hyperlipidemia who presents with acute worsening of chronic dyspnea.  Patient was intubated on arrival to the emergency department and is unable to provide any history however the emergency room provider who spoke to his sister states that he lives with her, has some developmental delays, and was complaining of dyspnea since around the time of his end STEMI in November.  This morning she called him and stated that he was having nausea and vomiting, which per her is not unusual for him, but then later complained of severe acute pain worsening shortness of breath.  She stated that he was not having any fevers, chills, chest pain.  No other localizing complaints.  To the emergency department he was noted to be confused, lethargic.  He was tried on BiPAP however his blood gas was concerning for progressive respiratory acidosis and he was intubated for presumed impending respiratory failure with etomidate and rocuronium.  He was also given a dose each of vancomycin and Zosyn, bolused 1 L of IV fluids.  He was sedated on propofol on the time of my examination. 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  No Known Allergies  No current facility-administered medications on file prior to encounter.    Current Outpatient Medications on File Prior to Encounter  Medication Sig  . aspirin EC 81 MG EC tablet Take 1 tablet (81 mg total) by mouth daily.  Marland Kitchen aspirin-acetaminophen-caffeine (EXCEDRIN MIGRAINE) 250-250-65 MG tablet Take 2 tablets by mouth every 8 (eight) hours as needed for headache.  Marland Kitchen atorvastatin (LIPITOR) 40 MG tablet Take 80 mg by mouth at bedtime.  . clopidogrel (PLAVIX) 75 MG tablet Take 1 tablet (75 mg total) daily by mouth.  . feeding supplement, GLUCERNA SHAKE, (GLUCERNA SHAKE) LIQD Take 237 mLs by mouth 2 (two) times daily between meals.  . isosorbide mononitrate (IMDUR) 30 MG 24 hr tablet Take 2 tablets (60 mg total) daily by mouth.  Marland Kitchen lisinopril (PRINIVIL,ZESTRIL) 2.5 MG tablet Take 1 tablet (2.5 mg total) daily by mouth.  . metFORMIN (GLUCOPHAGE) 500 MG tablet Take 1,000 mg by mouth 2 (two) times daily with a meal.  . metoprolol tartrate (LOPRESSOR) 50 MG tablet Take 1 tablet (50 mg total) 2 (two) times daily by mouth.  . mirtazapine (REMERON) 15 MG tablet Take 15 mg by mouth at bedtime.  . Multiple Vitamin (MULTIVITAMIN WITH MINERALS) TABS tablet Take 1 tablet by mouth daily.  . nicotine (NICODERM CQ - DOSED IN MG/24 HOURS) 14 mg/24hr patch Place 1 patch (14 mg total) onto the skin daily. (Patient not taking: Reported on 06/02/2017)  . nitroGLYCERIN (NITROSTAT) 0.4 MG SL tablet Place 1 tablet (  0.4 mg total) under the tongue every 5 (five) minutes x 3 doses as needed for chest pain.  Marland Kitchen oxyCODONE (OXY IR/ROXICODONE) 5 MG immediate release tablet Take 1-2 tablets (5-10 mg total) by mouth every 4 (four) hours as needed for  moderate pain.  Marland Kitchen sertraline (ZOLOFT) 100 MG tablet Take 100 mg by mouth daily. DEPRESSION/ANXIETY    FAMILY HISTORY:  His indicated that his mother is deceased. He indicated that his father is deceased. He indicated that his sister is alive.   SOCIAL HISTORY: He  reports that he has been smoking cigarettes.  He has been smoking about 1.00 pack per day. he has never used smokeless tobacco. He reports that he does not drink alcohol or use drugs.  REVIEW OF SYSTEMS:   Unable to obtain   VITAL SIGNS: BP (!) 161/91   Pulse 81   Temp (!) 97.5 F (36.4 C) (Oral)   Resp (!) 24   Ht _0  (1.854 m)   Wt 179 lb (81.2 kg)   SpO2 100%   BMI 23.62 kg/m      VENTILATOR SETTINGS: Vent Mode: PRVC FiO2 (%):  [40 %-100 %] 80 % Set Rate:  [12 bmp-24 bmp] 24 bmp Vt Set:  [640 mL] 640 mL PEEP:  [5 cmH20] 5 cmH20 Plateau Pressure:  [18 cmH20] 18 cmH20  INTAKE / OUTPUT: No intake/output data recorded.  PHYSICAL EXAMINATION: Physical Exam  Constitutional:  Intubated, sedated, lying flaccid in bed  HENT:  Head: Normocephalic and atraumatic.  Eyes: Pupils are equal, round, and reactive to light. Right eye exhibits no discharge. Left eye exhibits no discharge.  Neck: JVD (To the angle of the mandible while lying flat bilaterally) present.  Cardiovascular: Normal rate and normal heart sounds.  No murmur heard. Pulmonary/Chest: Effort normal. No stridor. No respiratory distress. He has no wheezes. He has no rales.  Abdominal: Soft. He exhibits no distension. There is tenderness (Questionable grimacing in the left upper quadrant).  Musculoskeletal: He exhibits no edema or tenderness.  Neurological:  Pupils reactive, gag present.    Skin: Skin is warm and dry.    LABS:  BMET Recent Labs  Lab 07/23/17 0706 07/24/17 0211 07/28/17 0813 07/28/17 0829  NA 137 135 136 138  K 4.0 4.7 5.6* 5.7*  CL 108 107 106 110  CO2 19* 20* 13*  --   BUN 17 21* 23* 29*  CREATININE 1.21 1.51*  1.73* 1.40*  GLUCOSE 108* 182* 241* 241*    Electrolytes Recent Labs  Lab 07/23/17 0706 07/24/17 0211 07/28/17 0813  CALCIUM 8.4* 8.4* 9.1    CBC Recent Labs  Lab 07/23/17 0706 07/28/17 0813 07/28/17 0829  WBC 10.2 10.5  --   HGB 10.1* 11.1* 11.9*  HCT 30.8* 34.2* 35.0*  PLT 187 271  --     Coag's Recent Labs  Lab 07/23/17 0706  APTT 37*  INR 1.24    Sepsis Markers Recent Labs  Lab 07/28/17 0829 07/28/17 1012  LATICACIDVEN 7.82* 4.72*    ABG Recent Labs  Lab 07/28/17 0820  PHART 7.314*  PCO2ART 24.2*  PO2ART 67.0*    Liver Enzymes Recent Labs  Lab 07/23/17 0706 07/28/17 0813  AST 13* 94*  ALT 15* 91*  ALKPHOS 77 119  BILITOT 0.6 0.8  ALBUMIN 2.9* 3.3*    Cardiac Enzymes Recent Labs  Lab 07/28/17 0813  TROPONINI 0.08*    Glucose Recent Labs  Lab 07/23/17 0612 07/23/17 0958 07/23/17 2119 07/24/17 0603 07/24/17 1200 07/28/17  0802  GLUCAP 102* 138* 300* 169* 246* 234*    Imaging Dg Chest Portable 1 View  Result Date: 07/28/2017 CLINICAL DATA:  Respiratory failure, NSTEMI, intubated patient. EXAM: PORTABLE CHEST 1 VIEW COMPARISON:  Chest x-ray of earlier today prior to intubation FINDINGS: The endotracheal tube tip lies approximately 5.8 cm above the carina. The lungs are well-expanded. The perihilar lung markings remain increased. There is no pneumothorax or pleural effusion. The heart is normal in size. The pulmonary vascularity is engorged. There are post CABG changes. There are broken sternal wires present which are unchanged. The esophagogastric tube tip projects below the inferior margin of the image. IMPRESSION: The endotracheal tube tip lies 5.8 cm above the carina. Pulmonary interstitial and early alveolar edema. No discrete pneumonia. Previous CABG. Electronically Signed   By: David  Martinique M.D.   On: 07/28/2017 09:46   Dg Chest Portable 1 View  Result Date: 07/28/2017 CLINICAL DATA:  Shortness of breath EXAM: PORTABLE  CHEST 1 VIEW COMPARISON:  06/26/2017 FINDINGS: Bilateral diffuse interstitial thickening. Trace right pleural effusion. No pneumothorax. No focal consolidation. Stable cardiomediastinal silhouette. Prior CABG. No acute osseous abnormality. IMPRESSION: Findings concerning for mild pulmonary edema. Electronically Signed   By: Kathreen Devoid   On: 07/28/2017 08:21     STUDIES:  CTH>>> CTC>>> CT AP>>>  CXR 12/11 FINDINGS: The endotracheal tube tip lies approximately 5.8 cm above the carina. The lungs are well-expanded. The perihilar lung markings remain increased. There is no pneumothorax or pleural effusion. The heart is normal in size. The pulmonary vascularity is engorged. There are post CABG changes. There are broken sternal wires present which are unchanged. The esophagogastric tube tip projects below the inferior margin of the image.  IMPRESSION: The endotracheal tube tip lies 5.8 cm above the carina.  Pulmonary interstitial and early alveolar edema. No discrete pneumonia.  CULTURES: Blood cultures x2 12/11>>> Tracheal aspirate 12/11>>>  ANTIBIOTICS: Vancomycin 12/11>> Zosyn 12/11>>>  SIGNIFICANT EVENTS: 12/11 admitted with AMS, acute hypoxemic respiratory failure and intubated  LINES/TUBES: ETT 12/11 Foley 12/11  DISCUSSION: admission discussed with sister Jerene Pitch ([phone]) by ED physician 12/11  ASSESSMENT / PLAN: 66 year old male history of coronary artery disease status post recent NSTEMI with stent placement 06/2017, hypertension, hyperlipidemia who presents with acute worsening of chronic dyspnea, found to be in acute hypoxemic respiratory failure requiring intubation along with metabolic encephalopathy.  Unclear cause, evidence of pulmonary vascular congestion and JVD on exam concerning for acute decompensated heart failure due to flash pulmonary edema  Addendum: CT chest reviewed with concern for endobronchial obstruction, as well as slight interval  increase in seize of RUL nodule.  Given this a flexible bronchoscopy was performed at bedside which revealed no evidence of obstruction.  There were no secretions, thin or otherwise.  A bronchoalveolar lavage was performed in the RML and sent for micro/cell counts/cytology.    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  Continue to wean ventilator based on pulse oximetry -Daily spontaneous awakening trial and spontaneous breathing trial - will need follow up vocal cord inspection once stable, can be deferred to outpatient  CARDIOVASCULAR A:  Suspected decompensated heart failure and flash pulmonary edema  P:  - lasix  for diuresis goal net negative 1-2L - preload reduction with nitroglycerin drip to target a systolic blood pressure [phone] - restart home isosorbide. Hold home beta blocker given decompensated heart failure - continue home aspirin/statin/plavix - trend lactic acidosis, suspect due to acute hypoxemia. Already downtrending   RENAL A:   AKI - most likely prerenal/ATN   P:   - hold home lisinopril with AKI and hyperkalemia, can restart if renal function and hyerpkalemia stable/improving   GASTROINTESTINAL A:   Mild transaminitis - suspect due to acute respiratory failure  P:   - trend to be sure they are downtrending with stabilization  HEMATOLOGIC A:   No active issues   INFECTIOUS A:   Hypoxemic respiratory failure of unclear cause  P:   - We will continue empiric vancomycin and Zosyn while his blood/sputum cultures are pending.  We will also check a pro-calcitonin and trend in addition, likely discontinue antibiotics  if respiratory failure improving, remains afebrile without leukocytosis or other evidence of infection - follow up on imaging as above for possible source of infection  ENDOCRINE A:   Diabetes mellitus   P:   - hold home metformin, sliding scale insulin  NEUROLOGIC A:   Acute encephalopathy, presumed metabolic due to hypoxemia  P:   RASS goal: 0 -Per family was not complaining of any focal neuro deficits.  Will check a CT head however given the lack of ability to obtain history from the patient and his confusion on arrival   FAMILY  - Updates:  Sister Gretel Acre confirmed he is full code 07/28/17 with Dr Ellender Hose in the ED  - Inter-disciplinary family meet or Palliative Care meeting due by:  day 7   Mali Jazarah Capili, MD [facility] [place] Pager: [phone]  07/28/2017, 10:20 AM

## 2017-07-28 NOTE — Progress Notes (Signed)
Titrating O2 down based on sats per MD.

## 2017-07-28 NOTE — Progress Notes (Signed)
Pharmacy Antibiotic Note Jordan Gardner is a 66 y.o. male admitted on 07/28/2017 with SOB and N/V. Was recently admitted with arterial occlusive disease, had stents placed. Starting empiric antibiotics for pneumonia. Required intubation in the ED, increasing tachypneic. SCr 1.4, eCrCl 50-60 ml/min.   Plan: -Vancomycin 1500 mg IV x1 then 750 mg IV q12h -Zosyn 3.375 g IV q8h -Monitor renal fx, cultures, vancomycin trough as needed   Height: 6\' 1"  (185.4 cm) Weight: 179 lb (81.2 kg) IBW/kg (Calculated) : 79.9  Temp (24hrs), Avg:97.5 F (36.4 C), Min:97.5 F (36.4 C), Max:97.5 F (36.4 C)  Recent Labs  Lab 07/23/17 0706 07/24/17 0211 07/28/17 0813 07/28/17 0829  WBC 10.2  --  10.5  --   CREATININE 1.21 1.51* 1.73* 1.40*  LATICACIDVEN  --   --   --  7.82*    Estimated Creatinine Clearance: 59.4 mL/min (A) (by C-G formula based on SCr of 1.4 mg/dL (H)).    No Known Allergies  Antimicrobials this admission: 12/11 vancomycin > 12/11 zosyn >  Dose adjustments this admission: N/A  Microbiology results: 12/11 blood cx: 12/11 urine cx:  Jordan Gardner, Jordan Gardner 07/28/2017 9:51 AM

## 2017-07-28 NOTE — Procedures (Signed)
Bronchoscopy Procedure Note Chanc Lawless [id number] Jul 03, 1951  Procedure: Bronchoscopy Indications: Diagnostic evaluation of the airways and Obtain specimens for culture and/or other diagnostic studies  Procedure Details Consent: Risks of procedure as well as the alternatives and risks of each were explained to the patient's sister.  Consent for procedure obtained. Time Out: Verified patient identification, verified procedure, site/side was marked, verified correct patient position, special equipment/implants available, medications/allergies/relevent history reviewed, required imaging and test results available.  Performed  In preparation for procedure, patient was given 100% FiO2 and bronchoscope lubricated. Sedation: fentanyl 100 mcg, versed 4 mg  Airway entered and the following bronchi were examined: RUL, RML, RLL, LUL and LLL.   Procedures performed: BAL of the RML Bronchoscope removed.    Evaluation Hemodynamic Status: BP stable throughout; O2 sats: stable throughout Patient's Current Condition: stable Specimens:  Sent clear fluid Complications: No apparent complications Patient did tolerate procedure well.   Mali Nusaiba Guallpa 07/28/2017

## 2017-07-28 NOTE — ED Notes (Signed)
Pt c/o chest "stinging"--

## 2017-07-28 NOTE — Progress Notes (Signed)
RT transported patient on ventilator from ED trauma room A to 2M10 with no complications. Vitals are stable and RT in unit has been given report and notified. RT will continue to monitor.

## 2017-07-28 NOTE — Progress Notes (Signed)
Inpatient Diabetes Program Recommendations  AACE/ADA: New Consensus Statement on Inpatient Glycemic Control (2015)  Target Ranges:  Prepandial:   less than 140 mg/dL      Peak postprandial:   less than 180 mg/dL (1-2 hours)      Critically ill patients:  140 - 180 mg/dL   Lab Results  Component Value Date   GLUCAP 188 (H) 07/28/2017   HGBA1C 7.1 (H) 07/28/2017    Review of Glycemic ControlResults for Pinnix, Lacharles (MRN [id number]) as of 07/28/2017 13:32  Ref. Range 07/28/2017 08:02 07/28/2017 12:22  Glucose-Capillary Latest Ref Range: 65 - 99 mg/dL 657234 (H) 846188 (H)    Diabetes history: Type 2 DM Outpatient Diabetes medications: Metformin 1000 mg bid Current orders for Inpatient glycemic control:  Novolog moderate tid with meals   Inpatient Diabetes Program Recommendations:    Please consider changing to ICU glycemic control order set with "standard" Novolog correction q 4 hours.    Thanks, Beryl MeagerJenny Amelda Hapke, RN, BC-ADM Inpatient Diabetes Coordinator Pager [phone] (8a-5p)

## 2017-07-28 NOTE — Progress Notes (Signed)
CRITICAL VALUE ALERT  Critical Value: lactic acid 3.2 trending down  Date & Time Notied:  07/28/17 @1300 

## 2017-07-28 NOTE — ED Triage Notes (Addendum)
To ED via [facility] from home, with c/o shortness of breath- ems called to home with pt c/o nausea/vomiting and shortness of breath. Pt was in hospital last week with MI, stents plaaced and d/c'd on Thursday.  On arrival pt not on any O2, sats-- 98% -- unable to speak in sentences, pt had vomited x 3 prior to arrival

## 2017-07-28 NOTE — ED Notes (Signed)
Report given to 2 M RN 

## 2017-07-28 NOTE — Telephone Encounter (Signed)
Sched appt 08/26/16 at 2:00. Ph# not going through, mailed letter.

## 2017-07-28 NOTE — ED Notes (Addendum)
Jordan Gardner (Mental Health Nurse Case Manager from [facility] in [place]) [phone]-- requests to be called by hospital case manager prior to any discharge to look for possible placement.

## 2017-07-28 NOTE — Progress Notes (Signed)
RT transported patient on ventilator from trauma room A to CT and back to trauma room A with no complications. Vitals are stable. RT will continue to monitor.

## 2017-07-28 NOTE — ED Notes (Signed)
[phone] Jordan Gardner (pt's sister- pt is his own guardian, sister helps with bills and day to day living) Pt also goes to adult day center 3x/week.

## 2017-07-28 NOTE — Progress Notes (Signed)
elink notified regarding home metoprolol ordered for this admission, however admitting MD progress note stated to hold all home beta blockers. Clarification received, verbal orders from Dr. Dellie CatholicSommers to hold 2200 metoprolol at this time. Nursing will continue to monitor.

## 2017-07-28 NOTE — ED Notes (Signed)
Returned from CT.

## 2017-07-28 NOTE — ED Provider Notes (Signed)
[facility] EMERGENCY DEPARTMENT Provider Note   CSN: [id number] Arrival date & time: 07/28/17  0753     History   Chief Complaint Chief Complaint  Patient presents with  . Shortness of Breath    HPI Jordan Gardner is a 66 y.o. male.  HPI   66 year old male with extensive past medical history as below who presents to with shortness of breath.  History is somewhat limited secondary to baseline affect of disorder.  However, the patient states that he recently suffered a heart attack and had a stent placed several weeks ago.  Since discharge, he has had progressively worsening shortness of breath.  He states it was initially with exertion, but now at rest.  It is worse when lying flat.  He has associated intermittent sharp left-sided chest pain that comes and goes.  It is not persistent.  He denies any sputum production.  Has had a mild dry cough.  Denies any weight gain, and has instead had weight loss due to poor appetite.  He has had some chills but no known fevers.  His major complaint at this time is ongoing shortness of breath, though this improved with oxygen via EMS.  He also reports that he feels cold.  Denies any abdominal pain.  Denies any leg swelling. He has been taking his meds as prescribed.  Past Medical History:  Diagnosis Date  . Anxiety   . Arthritis   . Asthma   . Coronary artery disease   . Depression   . Diabetes mellitus without complication (HCC)    type 2  . Headache   . Hyperlipidemia   . Hypertension   . Myocardial infarction (HCC)   . Neuromuscular disorder (HCC)   . PAD (peripheral artery disease) (HCC)   . Protein calorie malnutrition (HCC) 04/2017  . Psychotic affective disorder (HCC)    Per sister. Unsure of Dx. Pt sleeps late and difficult to wake. odd affect  . Sacral decubitus ulcer 04/2017  . Stroke [facility])    left side weakness    Patient Active Problem List   Diagnosis Date Noted  . Subclavian artery stenosis (HCC)  07/23/2017  . 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 Critical lower limb ischemia   . 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 AKI (acute kidney injury) (HCC) 04/28/2017  . Malnutrition of moderate degree 04/28/2017  . Pressure injury of skin 04/27/2017  . Protein calorie malnutrition (HCC) 04/27/2017  . Physical deconditioning 04/27/2017  . Ulcer of great toe (HCC) 04/27/2017  . Claudication (HCC) 04/27/2017  . Sacral decubitus ulcer 04/27/2017  . Accelerated hypertension   . Hyperlipidemia   . Tobacco abuse disorder   . Altered mental status   . PAD (peripheral artery disease) (HCC)   . NSTEMI (non-ST elevated myocardial infarction) (HCC) 04/25/2017  . NECK PAIN 10/16/2008  . BACK PAIN 10/16/2008    Past Surgical History:  Procedure Laterality Date  . ABDOMINAL AORTOGRAM W/LOWER EXTREMITY N/A 06/08/2017   Procedure: ABDOMINAL AORTOGRAM W/LOWER EXTREMITY;  Surgeon: Chuck Hint, MD;  Location: [facility] INVASIVE CV LAB;  Service: Cardiovascular;  Laterality: N/A;  . BACK SURGERY    . CARDIAC CATHETERIZATION    . CARDIAC SURGERY     CABG x5 2012 - [facility]  . CAROTID-SUBCLAVIAN BYPASS GRAFT Left 07/23/2017   Procedure: BYPASS SUBCLAVIAN ARTERY  USING VIABAHN GORE STENT GRAFT;  Surgeon: Waverly Ferrari  S, MD;  Location: [facility] OR;  Service: Vascular;  Laterality: Left;  . LEFT HEART CATH AND CORS/GRAFTS ANGIOGRAPHY N/A 06/26/2017   Procedure: LEFT HEART CATH AND CORS/GRAFTS ANGIOGRAPHY;  Surgeon: Tonny Bollman, MD;  Location: [facility] INVASIVE CV LAB;  Service: Cardiovascular;  Laterality: N/A;       Home Medications    Prior to Admission medications   Medication Sig Start Date End Date Taking? Authorizing Provider  aspirin EC 81 MG EC tablet Take 1 tablet (81 mg total) by mouth daily. 04/30/17   Cleora Fleet, MD  aspirin-acetaminophen-caffeine (EXCEDRIN  MIGRAINE) [phone] MG tablet Take 2 tablets by mouth every 8 (eight) hours as needed for headache.    [provider]  atorvastatin (LIPITOR) 40 MG tablet Take 80 mg by mouth at bedtime.    [provider]  clopidogrel (PLAVIX) 75 MG tablet Take 1 tablet (75 mg total) daily by mouth. 07/01/17 08/30/17  Arty Baumgartner, NP  feeding supplement, GLUCERNA SHAKE, (GLUCERNA SHAKE) LIQD Take 237 mLs by mouth 2 (two) times daily between meals. 04/30/17   Cleora Fleet, MD  isosorbide mononitrate (IMDUR) 30 MG 24 hr tablet Take 2 tablets (60 mg total) daily by mouth. 07/01/17   Arty Baumgartner, NP  lisinopril (PRINIVIL,ZESTRIL) 2.5 MG tablet Take 1 tablet (2.5 mg total) daily by mouth. 07/02/17   Arty Baumgartner, NP  metFORMIN (GLUCOPHAGE) 500 MG tablet Take 1,000 mg by mouth 2 (two) times daily with a meal.    [provider]  metoprolol tartrate (LOPRESSOR) 50 MG tablet Take 1 tablet (50 mg total) 2 (two) times daily by mouth. 07/01/17   Arty Baumgartner, NP  mirtazapine (REMERON) 15 MG tablet Take 15 mg by mouth at bedtime.    [provider]  Multiple Vitamin (MULTIVITAMIN WITH MINERALS) TABS tablet Take 1 tablet by mouth daily. 04/30/17   Johnson, Clanford L, MD  nicotine (NICODERM CQ - DOSED IN MG/24 HOURS) 14 mg/24hr patch Place 1 patch (14 mg total) onto the skin daily. Patient not taking: Reported on 06/02/2017 04/30/17   Cleora Fleet, MD  nitroGLYCERIN (NITROSTAT) 0.4 MG SL tablet Place 1 tablet (0.4 mg total) under the tongue every 5 (five) minutes x 3 doses as needed for chest pain. 04/29/17   Johnson, Clanford L, MD  oxyCODONE (OXY IR/ROXICODONE) 5 MG immediate release tablet Take 1-2 tablets (5-10 mg total) by mouth every 4 (four) hours as needed for moderate pain. 07/24/17   Lars Mage, PA-C  sertraline (ZOLOFT) 100 MG tablet Take 100 mg by mouth daily. DEPRESSION/ANXIETY    [provider]    Family History Family History    Problem Relation Age of Onset  . Hypertension Sister     Social History Social History   Tobacco Use  . Smoking status: Current Every Day Smoker    Packs/day: 1.00    Types: Cigarettes  . Smokeless tobacco: Never Used  Substance Use Topics  . Alcohol use: No  . Drug use: No     Allergies   Patient has no known allergies.   Review of Systems Review of Systems  Constitutional: Positive for fatigue and unexpected weight change.  Respiratory: Positive for cough and shortness of breath.   Cardiovascular: Positive for chest pain.  Neurological: Positive for weakness.  All other systems reviewed and are negative.    Physical Exam Updated Vital Signs BP (!) 161/91   Pulse 81   Temp (!) 97.5 F (36.4 C) (  Oral)   Resp (!) 24   Ht 6\' 1"  (1.854 m)   Wt 81.2 kg (179 lb)   SpO2 100%   BMI 23.62 kg/m   Physical Exam  Constitutional: He is oriented to person, place, and time. He appears well-developed and well-nourished. He appears distressed.  HENT:  Head: Normocephalic and atraumatic.  Eyes: Conjunctivae are normal.  Neck: Neck supple.  Cardiovascular: Regular rhythm and normal heart sounds. Exam reveals no friction rub.  No murmur heard. Tachycardic  Pulmonary/Chest: Effort normal. Tachypnea noted. No respiratory distress. He has decreased breath sounds. He has no wheezes. He has no rales.  Abdominal: He exhibits no distension.  Musculoskeletal: He exhibits no edema.  Neurological: He is alert and oriented to person, place, and time. He exhibits normal muscle tone.  Skin: Skin is warm. Capillary refill takes less than 2 seconds.  Psychiatric: He has a normal mood and affect.  Nursing note and vitals reviewed.    ED Treatments / Results  Labs (all labs ordered are listed, but only abnormal results are displayed) Labs Reviewed  CBC WITH DIFFERENTIAL/PLATELET - Abnormal; Notable for the following components:      Result Value   RBC 4.08 (*)    Hemoglobin 11.1  (*)    HCT 34.2 (*)    Neutro Abs 8.4 (*)    All other components within normal limits  COMPREHENSIVE METABOLIC PANEL - Abnormal; Notable for the following components:   Potassium 5.6 (*)    CO2 13 (*)    Glucose, Bld 241 (*)    BUN 23 (*)    Creatinine, Ser 1.73 (*)    Albumin 3.3 (*)    AST 94 (*)    ALT 91 (*)    GFR calc non Af Amer 40 (*)    GFR calc Af Amer 46 (*)    Anion gap 17 (*)    All other components within normal limits  TROPONIN I - Abnormal; Notable for the following components:   Troponin I 0.08 (*)    All other components within normal limits  I-STAT TROPONIN, ED - Abnormal; Notable for the following components:   Troponin i, poc 0.09 (*)    All other components within normal limits  I-STAT CG4 LACTIC ACID, ED - Abnormal; Notable for the following components:   Lactic Acid, Venous 7.82 (*)    All other components within normal limits  I-STAT CHEM 8, ED - Abnormal; Notable for the following components:   Potassium 5.7 (*)    BUN 29 (*)    Creatinine, Ser 1.40 (*)    Glucose, Bld 241 (*)    Calcium, Ion 1.13 (*)    TCO2 16 (*)    Hemoglobin 11.9 (*)    HCT 35.0 (*)    All other components within normal limits  I-STAT ARTERIAL BLOOD GAS, ED - Abnormal; Notable for the following components:   pH, Arterial 7.314 (*)    pCO2 arterial 24.2 (*)    pO2, Arterial 67.0 (*)    Bicarbonate 12.4 (*)    TCO2 13 (*)    Acid-base deficit 12.0 (*)    All other components within normal limits  CBG MONITORING, ED - Abnormal; Notable for the following components:   Glucose-Capillary 234 (*)    All other components within normal limits  I-STAT CG4 LACTIC ACID, ED - Abnormal; Notable for the following components:   Lactic Acid, Venous 4.72 (*)    All other components within normal limits  CULTURE, BLOOD (ROUTINE X 2)  CULTURE, BLOOD (ROUTINE X 2)  URINE CULTURE  LIPASE, BLOOD  BRAIN NATRIURETIC PEPTIDE  URINALYSIS, ROUTINE W REFLEX MICROSCOPIC  TRIGLYCERIDES    I-STAT ARTERIAL BLOOD GAS, ED    EKG  EKG Interpretation  Date/Time:  Tuesday July 28 2017 08:26:36 EST Ventricular Rate:  83 PR Interval:    QRS Duration: 143 QT Interval:  445 QTC Calculation: 523 R Axis:   156 Text Interpretation:  Sinus rhythm Nonspecific intraventricular conduction delay Probable inferior infarct, old No significant change since last tracing Confirmed by Shaune Pollack [phone]) on 07/28/2017 8:34:18 AM       Radiology Dg Chest Portable 1 View  Result Date: 07/28/2017 CLINICAL DATA:  Respiratory failure, NSTEMI, intubated patient. EXAM: PORTABLE CHEST 1 VIEW COMPARISON:  Chest x-ray of earlier today prior to intubation FINDINGS: The endotracheal tube tip lies approximately 5.8 cm above the carina. The lungs are well-expanded. The perihilar lung markings remain increased. There is no pneumothorax or pleural effusion. The heart is normal in size. The pulmonary vascularity is engorged. There are post CABG changes. There are broken sternal wires present which are unchanged. The esophagogastric tube tip projects below the inferior margin of the image. IMPRESSION: The endotracheal tube tip lies 5.8 cm above the carina. Pulmonary interstitial and early alveolar edema. No discrete pneumonia. Previous CABG. Electronically Signed   By: David  [place] M.D.   On: 07/28/2017 09:46   Dg Chest Portable 1 View  Result Date: 07/28/2017 CLINICAL DATA:  Shortness of breath EXAM: PORTABLE CHEST 1 VIEW COMPARISON:  06/26/2017 FINDINGS: Bilateral diffuse interstitial thickening. Trace right pleural effusion. No pneumothorax. No focal consolidation. Stable cardiomediastinal silhouette. Prior CABG. No acute osseous abnormality. IMPRESSION: Findings concerning for mild pulmonary edema. Electronically Signed   By: Elige Ko   On: 07/28/2017 08:21    Procedures .Critical Care Performed by: Shaune Pollack, MD Authorized by: Shaune Pollack, MD   Critical care provider statement:     Critical care time (minutes):  45   Critical care time was exclusive of:  Separately billable procedures and treating other patients and teaching time   Critical care was necessary to treat or prevent imminent or life-threatening deterioration of the following conditions:  Circulatory failure, respiratory failure and sepsis   Critical care was time spent personally by me on the following activities:  Development of treatment plan with patient or surrogate, discussions with consultants, evaluation of patient's response to treatment, examination of patient, obtaining history from patient or surrogate, ordering and performing treatments and interventions, ordering and review of laboratory studies, ordering and review of radiographic studies, pulse oximetry, re-evaluation of patient's condition and review of old charts   I assumed direction of critical care for this patient from another provider in my specialty: no   Procedure Name: Intubation Date/Time: 07/28/2017 10:26 AM Performed by: Shaune Pollack, MD Pre-anesthesia Checklist: Patient identified, Patient being monitored, Emergency Drugs available, Timeout performed and Suction available Oxygen Delivery Method: Non-rebreather mask Preoxygenation: Pre-oxygenation with 100% oxygen Induction Type: Rapid sequence Ventilation: Mask ventilation without difficulty Laryngoscope Size: Glidescope and 4 Grade View: Grade I Number of attempts: 1 Airway Equipment and Method: Rigid stylet and Video-laryngoscopy Placement Confirmation: ETT inserted through vocal cords under direct vision,  CO2 detector and Breath sounds checked- equal and bilateral Secured at: 22 cm Tube secured with: Tape Dental Injury: Teeth and Oropharynx as per pre-operative assessment  Difficulty Due To: Difficulty was unanticipated Future Recommendations: Recommend- induction with short-acting agent, and alternative  techniques readily available      (including critical care  time)  Medications Ordered in ED Medications  nitroGLYCERIN (NITROSTAT) SL tablet 0.4 mg (0.4 mg Sublingual Given 07/28/17 0826)  aspirin chewable tablet 324 mg (324 mg Oral Not Given 07/28/17 0927)  vancomycin (VANCOCIN) 1,500 mg in sodium chloride 0.9 % 500 mL IVPB (1,500 mg Intravenous New Bag/Given 07/28/17 0948)  etomidate (AMIDATE) injection 20 mg (20 mg Intravenous Not Given 07/28/17 1013)  rocuronium (ZEMURON) injection 81.2 mg ( Intravenous Canceled Entry 07/28/17 1014)  sodium bicarbonate injection 50 mEq ( Intravenous Canceled Entry 07/28/17 1014)  sodium chloride 0.9 % bolus 1,000 mL (1,000 mLs Intravenous New Bag/Given 07/28/17 0948)  sodium bicarbonate injection (100 mEq Intravenous Given 07/28/17 0923)  etomidate (AMIDATE) injection (24 mg Intravenous Given 07/28/17 0924)  rocuronium (ZEMURON) injection (81.2 mg Intravenous Given 07/28/17 0926)  fentaNYL (SUBLIMAZE) injection 50 mcg (not administered)  propofol (DIPRIVAN) 1000 MG/100ML infusion (10 mcg/kg/min  81.2 kg Intravenous New Bag/Given 07/28/17 0953)  piperacillin-tazobactam (ZOSYN) IVPB 3.375 g (not administered)  vancomycin (VANCOCIN) IVPB 750 mg/150 ml premix (not administered)  piperacillin-tazobactam (ZOSYN) IVPB 3.375 g (0 g Intravenous Stopped 07/28/17 0927)     Initial Impression / Assessment and Plan / ED Course  I have reviewed the triage vital signs and the nursing notes.  Pertinent labs & imaging results that were available during my care of the patient were reviewed by me and considered in my medical decision making (see chart for details).     66 year old male with history of recent end STEMI here with shortness of breath, fatigue, and hypoxia.  Patient reportedly lethargic in route.  He seems to be slightly improved here, but does appear drowsy.  He was trialed on BiPAP for his significant tachypnea.  Lab work shows severe metabolic acidosis with partial respiratory compensation.  Despite BiPAP,  patient increasingly tachypneic, followed by progressive bradypnea and episodes of apnea.  I am concerned he is developing a concomitant respiratory failure with concern for worsening acidosis.  I discussed with the patient and he confirmed he is full code.  Patient intubated.  Lab work otherwise shows significant lactic acidosis.  Broad-spectrum antibiotics were initiated.  I suspect this is due to his increased work of breathing and renal failure, but now the airway is secured, will give 1 L of fluid though he does have some mild edema on exam.  Do not feel 30 cc/kg indicated in the setting of his pronounced hypertension and likely CHF in setting of recent end STEMI.  Will consult intensivist for admission.  I discussed this with the patient's sister.  She is aware and in agreement with this plan.  On further obtainment of history, this seems like it was a relatively acute shortness of breath.  His lactic acid is also significantly cleared.  I suspect that this is more suggestive of an acute pulmonary edema with lactic acidosis secondary to hypoxia and work of breathing.  That being said, he did have some nausea and vomiting so will continue broad workup.  Admitted to the ICU.  Of note, during intubation I noted a small nodule of the vocal cord.  There appeared to be a small telangiectasia associated with this.  I was unable to assess mobility after patient had received rocuronium.  Would likely need outpatient follow-up.  Final Clinical Impressions(s) / ED Diagnoses   Final diagnoses:  Vocal cord polyp  Acute respiratory failure with hypoxia (HCC)  Lactic acidosis    Clinical Impression:  1. Vocal cord polyp   2. Acute respiratory failure with hypoxia (HCC)   3. Lactic acidosis     Disposition: Admit  Prior to providing a prescription for a controlled substance, I independently reviewed the patient's recent prescription history on the [place] Cont[place]. The  patient had no recent or regular prescriptions and was deemed appropriate for a brief, less than 3 day prescription of narcotic for acute analgesia.  This note was prepared with assistance of Conservation officer, historic buildingsDragon voice recognition software. Occasional wrong-word or sound-a-like substitutions may have occurred due to the inherent limitations of voice recognition software.     Shaune PollackIsaacs, Shallen Luedke, MD 07/28/17 1028

## 2017-07-28 NOTE — Discharge Summary (Signed)
[facility] Discharge Summary   Patient ID:  Jordan Gardner MRN: [id number] DOB/AGE: 65/02/1951 65 y.o.  Admit date: 07/23/2017 Discharge date: 07/28/2017 Date of Surgery: 07/24/2017 Surgeon: Surgeon(s): Chuck Hintickson, Christopher S, MD  Admission Diagnosis: left subclavian artery stenosis  Discharge Diagnoses:  left subclavian artery stenosis  Secondary Diagnoses: Past Medical History:  Diagnosis Date  . Anxiety   . Arthritis   . Asthma   . Coronary artery disease   . Depression   . Diabetes mellitus without complication (HCC)    type 2  . Headache   . Hyperlipidemia   . Hypertension   . Myocardial infarction (HCC)   . Neuromuscular disorder (HCC)   . PAD (peripheral artery disease) (HCC)   . Protein calorie malnutrition (HCC) 04/2017  . Psychotic affective disorder (HCC)    Per sister. Unsure of Dx. Pt sleeps late and difficult to wake. odd affect  . Sacral decubitus ulcer 04/2017  . Stroke (HCC)    left side weakness    Procedure(s): BYPASS SUBCLAVIAN ARTERY  USING VIABAHN GORE STENT GRAFT  Discharged Condition: good  HPI: Jordan Gardner is a pleasant 66 y.o. male, who I saw in consultation on 05/27/2017.  He told me at that time that his legs have been hurting for about a year.  He had developed some rest pain in both feet at night.  Her symptoms have gotten worse over the last several months.  He had a small wound on his left third toe.  In reviewing his records, he had a non-ST MI in September of this year.  [facility] [place]  Reportedly, he refused catheterization at that time and was to [facility].  He was admitted on 06/26/2017 with chest pain.  This patient underwent left heart cath 06/26/2017.  This showed severe three-vessel coronary artery disease occlusion of the right coronary artery, total occlusion of the mid circumflex, severe LAD  diagonal disease.  There was a patent LIMA to LAD graft but a stenosis in the left subclavian artery proximal to the LIMA.  There was total occlusion of all saphenous vein grafts.  The patient had moderate segmental LV systolic dysfunction.  Currently the patient is not having chest pain.  He continues to have bilateral lower extremity claudication and early rest pain.  He denies symptoms in his left arm.  ARTERIAL DOPPLER STUDY: I reviewed the arterial Doppler study that was done on 04/28/2017. This showed monophasic Doppler signals in the dorsalis pedis and posterior tibial positions bilaterally. ABI on the right was 53%. ABI on the left was47%.  CT ANGIOGRAM CHEST ABDOMEN AND PELVIS: I reviewed his CT angiogram that was done on 04/25/2017. 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 There was a high-grade stenosis of the celiac axis and a moderate stenosis of both renal arteries. The superior mesenteric artery and inferior mesenteric artery were patent. Overall he has diffuse multilevel arterial occlusive disease.  ARTERIOGRAM: The patient underwent an arteriogram on 06/08/2017.  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  There was diffuse aortoiliac occlusive disease and bilateral hypogastric artery occlusions.  There was a greater than 50% infrarenal aortic stenosis.  On the left side there was diffuse disease in the common iliac artery, external iliac artery.  Common femoral artery was patent with a tight stenosis in the proximal deep femoral artery.  Superficial  femoral artery was occluded at its origin with reconstitution of the popliteal artery at the level of the knee.  Two-vessel runoff on the left via the anterior tibial and posterior tibial arteries which had mild diffuse disease.  On the right side, there was diffuse disease in the common iliac artery and external iliac artery.  Was also significant plaque in the common  femoral artery.  Occluded at its origin.  The deep femoral artery was patent.  There was reconstitution of the popliteal artery at the level knee with single-vessel runoff on the right via the posterior tibial artery.     Hospital Course:  Jordan Gardner is a 66 y.o. male is S/P left Procedure(s): BYPASS SUBCLAVIAN ARTERY  USING VIABAHN GORE STENT GRAFT   Left UE incision healing well Left hand warm with active range of motion and sensation intact   Assessment/Planning: POD # 1 Angioplasty and stenting of left subclavian artery with7 X 29 VBX covered stent  He is on  Plavix F/U with Dr. Edilia Boickson in 2-3 weeks    Significant Diagnostic Studies: CBC Lab Results  Component Value Date   WBC 10.5 07/28/2017   HGB 11.9 (L) 07/28/2017   HCT 35.0 (L) 07/28/2017   MCV 83.8 07/28/2017   PLT 271 07/28/2017    BMET    Component Value Date/Time   NA 138 07/28/2017 0829   K 5.7 (H) 07/28/2017 0829   CL 110 07/28/2017 0829   CO2 13 (L) 07/28/2017 0813   GLUCOSE 241 (H) 07/28/2017 0829   BUN 29 (H) 07/28/2017 0829   CREATININE 1.40 (H) 07/28/2017 0829   CALCIUM 9.1 07/28/2017 0813   GFRNONAA 40 (L) 07/28/2017 0813   GFRAA 46 (L) 07/28/2017 0813   COAG Lab Results  Component Value Date   INR 1.35 07/28/2017   INR 1.24 07/23/2017   INR 1.14 06/26/2017     Disposition:  Discharge to :Home Discharge Instructions    Call MD for:  redness, tenderness, or signs of infection (pain, swelling, bleeding, redness, odor or green/yellow discharge around incision site)   Complete by:  As directed    Call MD for:  severe or increased pain, loss or decreased feeling  in affected limb(s)   Complete by:  As directed    Call MD for:  temperature >100.5   Complete by:  As directed    Discharge instructions   Complete by:  As directed    You may wash your arm in 24 hours   Increase activity slowly   Complete by:  As directed    Walk with assistance use walker or cane as needed    Resume previous diet   Complete by:  As directed      Allergies as of 07/24/2017   No Known Allergies     Medication List    TAKE these medications   aspirin 81 MG EC tablet Take 1 tablet (81 mg total) by mouth daily.   aspirin-acetaminophen-caffeine 250-250-65 MG tablet Commonly known as:  EXCEDRIN MIGRAINE Take 2 tablets by mouth every 8 (eight) hours as needed for headache.   atorvastatin 40 MG tablet Commonly known as:  LIPITOR Take 80 mg by mouth at bedtime.   clopidogrel 75 MG tablet Commonly known as:  PLAVIX Take 1 tablet (75 mg total) daily by mouth.   feeding supplement (GLUCERNA SHAKE) Liqd Take 237 mLs by mouth 2 (two) times daily between meals.   isosorbide mononitrate 30 MG 24 hr tablet Commonly known as:  IMDUR Take 2 tablets (60 mg total) daily by mouth.   lisinopril 2.5 MG tablet Commonly known as:  PRINIVIL,ZESTRIL Take 1 tablet (2.5 mg total) daily by mouth.   metFORMIN 500 MG tablet Commonly known as:  GLUCOPHAGE Take 1,000 mg by mouth 2 (two) times daily with a meal.   metoprolol tartrate 50 MG tablet Commonly known as:  LOPRESSOR Take 1 tablet (50 mg total) 2 (two) times daily by mouth.   mirtazapine 15 MG tablet Commonly known as:  REMERON Take 15 mg by mouth at bedtime.   multivitamin with minerals Tabs tablet Take 1 tablet by mouth daily.   nicotine 14 mg/24hr patch Commonly known as:  NICODERM CQ - dosed in mg/24 hours Place 1 patch (14 mg total) onto the skin daily.   nitroGLYCERIN 0.4 MG SL tablet Commonly known as:  NITROSTAT Place 1 tablet (0.4 mg total) under the tongue every 5 (five) minutes x 3 doses as needed for chest pain.   oxyCODONE 5 MG immediate release tablet Commonly known as:  Oxy IR/ROXICODONE Take 1-2 tablets (5-10 mg total) by mouth every 4 (four) hours as needed for moderate pain.   sertraline 100 MG tablet Commonly known as:  ZOLOFT Take 100 mg by mouth daily. DEPRESSION/ANXIETY      Verbal and  written Discharge instructions given to the patient. Wound care per Discharge AVS Follow-up Information    Chuck Hint, MD Follow up in 2 week(s).   Specialties:  Vascular Surgery, Cardiology Why:  office will arrange Contact information: [address] [place] [address] [phone]           Signed: Mosetta Pigeon 07/28/2017, 3:47 PM

## 2017-07-28 NOTE — Telephone Encounter (Signed)
-----   Message from Sharee PimpleMarilyn K McChesney, RN sent at 07/24/2017  2:12 PM EST ----- Regarding: 2-3 weeks    ----- Message ----- From: Lars Mageollins, Emma M, PA-C Sent: 07/24/2017   7:31 AM To: Vvs Charge Pool  F/U with Dr. Edilia Boickson in 2-3 weeks no study needed s/p left subclavian stent

## 2017-07-29 DIAGNOSIS — G9340 Encephalopathy, unspecified: Secondary | ICD-10-CM

## 2017-07-29 DIAGNOSIS — J9601 Acute respiratory failure with hypoxia: Secondary | ICD-10-CM

## 2017-07-29 DIAGNOSIS — E876 Hypokalemia: Secondary | ICD-10-CM

## 2017-07-29 DIAGNOSIS — R918 Other nonspecific abnormal finding of lung field: Secondary | ICD-10-CM

## 2017-07-29 LAB — GLUCOSE, CAPILLARY
GLUCOSE-CAPILLARY: 94 mg/dL (ref 65–99)
GLUCOSE-CAPILLARY: 95 mg/dL (ref 65–99)
GLUCOSE-CAPILLARY: 86 mg/dL (ref 65–99)
Glucose-Capillary: 136 mg/dL — ABNORMAL HIGH (ref 65–99)
Glucose-Capillary: 89 mg/dL (ref 65–99)

## 2017-07-29 LAB — BASIC METABOLIC PANEL
Anion gap: 12 (ref 5–15)
BUN: 24 mg/dL — ABNORMAL HIGH (ref 6–20)
CALCIUM: 8.5 mg/dL — AB (ref 8.9–10.3)
CO2: 20 mmol/L — AB (ref 22–32)
CREATININE: 1.93 mg/dL — AB (ref 0.61–1.24)
Chloride: 108 mmol/L (ref 101–111)
GFR calc Af Amer: 40 mL/min — ABNORMAL LOW (ref >60)
GFR calc non Af Amer: 35 mL/min — ABNORMAL LOW (ref >60)
GLUCOSE: 101 mg/dL — AB (ref 65–99)
Potassium: 4.3 mmol/L (ref 3.5–5.1)
Sodium: 140 mmol/L (ref 135–145)

## 2017-07-29 LAB — PATHOLOGIST SMEAR REVIEW

## 2017-07-29 LAB — PHOSPHORUS: Phosphorus: 3.1 mg/dL (ref 2.5–4.6)

## 2017-07-29 LAB — URINE CULTURE
Culture: NO GROWTH
SPECIAL REQUESTS: NORMAL

## 2017-07-29 LAB — MAGNESIUM: Magnesium: 1.3 mg/dL — ABNORMAL LOW (ref 1.7–2.4)

## 2017-07-29 MED ORDER — MAGNESIUM SULFATE 2 GM/50ML IV SOLN
2.0000 g | Freq: Once | INTRAVENOUS | Status: AC
  Administered 2017-07-29: 2 g via INTRAVENOUS
  Filled 2017-07-29: qty 50

## 2017-07-29 MED ORDER — PRO-STAT SUGAR FREE PO LIQD: 30.0000 mL | Freq: Two times a day (BID) | ORAL | Status: DC

## 2017-07-29 MED ORDER — VITAL AF 1.2 CAL PO LIQD
1000.0000 mL | ORAL | Status: DC
  Administered 2017-07-29 (×2): 1000 mL

## 2017-07-29 NOTE — Progress Notes (Signed)
Initial Nutrition Assessment  DOCUMENTATION CODES:   Severe malnutrition in context of chronic illness  INTERVENTION:    Vital AF 1.2 at 75 ml/h (1800 ml per day)  Provides 2160 kcal, 135 gm protein, 1460 ml free water daily  Monitor magnesium, potassium, and phosphorus daily for at least 3 days, MD to replete as needed, as pt is at risk for refeeding syndrome given severe protein calorie malnutrition.  NUTRITION DIAGNOSIS:   Severe Malnutrition related to chronic illness(neuromuscular disorder, CAD) as evidenced by severe muscle depletion, severe fat depletion, percent weight loss(13% weight loss within 3 months).  GOAL:   Patient will meet greater than or equal to 90% of their needs  MONITOR:   Vent status, TF tolerance, I & O's  REASON FOR ASSESSMENT:   Ventilator, Consult(verbal consult to start TF) Enteral/tube feeding initiation and management  ASSESSMENT:   66 yo male with PMH of HTN, CAD, psychotic affective disorder, sacral decubitus ulcer, PCM, HLD, PAD, stroke, asthma, DM, depression, anxiety, neuromuscular disorder who was admitted on 12/11 with AMS and SOB, requiring intubation on admission.  Discussed patient in ICU rounds and with RN and MD today. Received verbal MD Consult for TF initiation and management.  Patient is currently intubated on ventilator support MV: 15.1 L/min Temp (24hrs), Avg:100 F (37.8 C), Min:98.1 F (36.7 C), Max:100.8 F (38.2 C)   Labs reviewed. Magnesium 1.3 (L) CBG's: 117-94 Medications reviewed and include Lasix, Remeron.  14-23 lb weight loss was reported on Malnutrition Screening Tool. Per chart review, patient was 198 lbs 3 months ago, now down to 173 lbs. 13% weight loss within the past 3 months is significant for the time frame.    NUTRITION - FOCUSED PHYSICAL EXAM:    Most Recent Value  Orbital Region  Severe depletion  Upper Arm Region  Moderate depletion  Thoracic and Lumbar Region  Unable to assess  Buccal  Region  Moderate depletion  Temple Region  Severe depletion  Clavicle Bone Region  Severe depletion  Clavicle and Acromion Bone Region  Moderate depletion  Scapular Bone Region  Unable to assess  Dorsal Hand  Unable to assess  Patellar Region  Moderate depletion  Anterior Thigh Region  Moderate depletion  Posterior Calf Region  Moderate depletion  Edema (RD Assessment)  None  Hair  Reviewed  Eyes  Unable to assess  Mouth  Unable to assess  Skin  Reviewed  Nails  Unable to assess       Diet Order:  Diet NPO time specified  EDUCATION NEEDS:   No education needs have been identified at this time  Skin:  Skin Assessment: Reviewed RN Assessment  Last BM:  12/10  Height:   Ht Readings from Last 1 Encounters:  07/28/17 6\' 1"  (1.854 m)    Weight:   Wt Readings from Last 1 Encounters:  07/29/17 173 lb 4.5 oz (78.6 kg)    Ideal Body Weight:  83.6 kg  BMI:  Body mass index is 22.86 kg/m.  Estimated Nutritional Needs:   Kcal:  2200  Protein:  120-140 gm  Fluid:  2.2 L   Joaquin CourtsKimberly Nasser Ku, RD, LDN, CNSC Pager [phone] After Hours Pager [phone]

## 2017-07-29 NOTE — Progress Notes (Signed)
HR 60-61, BP stable, elink notified regarding 2200 metoprolol dose. Verbal orders from MD to hold this dose. Will continue to monitor.

## 2017-07-29 NOTE — Progress Notes (Signed)
PULMONARY / CRITICAL CARE MEDICINE   Name: Jordan Gardner MRN: [id number] DOB: [birth year]    ADMISSION DATE:  07/28/2017 CONSULTATION DATE: 07/28/17  REFERRING MD:   Duffy Bruce  CHIEF COMPLAINT:  Shortness of breath  HISTORY OF PRESENT ILLNESS:   66 year old male history of coronary artery disease status post recent NSTEMI with stent placement 06/2017, hypertension, hyperlipidemia who presents with acute worsening of chronic dyspnea.  Patient was intubated on arrival to the emergency department and is unable to provide any history however the emergency room provider who spoke to his sister states that he lives with her, has some developmental delays, and was complaining of dyspnea since around the time of his end STEMI in November.  This morning she called him and stated that he was having nausea and vomiting, which per her is not unusual for him, but then later complained of severe acute pain worsening shortness of breath.  She stated that he was not having any fevers, chills, chest pain.  No other localizing complaints.  To the emergency department he was noted to be confused, lethargic.  He was tried on BiPAP however his blood gas was concerning for progressive respiratory acidosis and he was intubated for presumed impending respiratory failure with etomidate and rocuronium.  He was also given a dose each of vancomycin and Zosyn, bolused 1 L of IV fluids.  He was sedated on propofol on the time of my examination.  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  No Known Allergies  No current facility-administered medications on file prior to encounter.    Current Outpatient Medications on File Prior to Encounter  Medication Sig  . aspirin EC 81 MG EC tablet Take 1 tablet (81 mg total) by mouth daily.  Marland Kitchen aspirin-acetaminophen-caffeine (EXCEDRIN MIGRAINE) 250-250-65 MG tablet Take 2 tablets by mouth every 8 (eight) hours as needed for headache.  Marland Kitchen atorvastatin (LIPITOR) 40 MG tablet Take 80 mg by mouth at bedtime.  . clopidogrel (PLAVIX) 75 MG tablet Take 1 tablet (75 mg total) daily by mouth.  . feeding supplement, GLUCERNA SHAKE, (GLUCERNA SHAKE) LIQD Take 237 mLs by mouth 2 (two) times daily between meals.  . isosorbide mononitrate (IMDUR) 30 MG 24 hr tablet Take 2 tablets (60 mg total) daily by mouth.  Marland Kitchen lisinopril (PRINIVIL,ZESTRIL) 2.5 MG tablet Take 1 tablet (2.5 mg total) daily by mouth.  . metFORMIN (GLUCOPHAGE) 500 MG tablet Take 1,000 mg by mouth 2 (two) times daily with a meal.  . metoprolol tartrate (LOPRESSOR) 50 MG tablet Take 1 tablet (50 mg total) 2 (two) times daily by mouth.  . mirtazapine (REMERON) 15 MG tablet Take 15 mg by mouth at bedtime.  . Multiple Vitamin (MULTIVITAMIN WITH MINERALS) TABS tablet Take 1 tablet by mouth daily.  . nitroGLYCERIN (NITROSTAT) 0.4 MG SL tablet Place 1 tablet (0.4 mg total) under the tongue every 5 (five) minutes x 3 doses as needed for chest pain.  Marland Kitchen oxyCODONE (OXY IR/ROXICODONE) 5 MG immediate release tablet  Take 1-2 tablets (5-10 mg total) by mouth every 4 (four) hours as needed for moderate pain.  Marland Kitchen sertraline (ZOLOFT) 100 MG tablet Take 100 mg by mouth daily. DEPRESSION/ANXIETY  . nicotine (NICODERM CQ - DOSED IN MG/24 HOURS) 14 mg/24hr  patch Place 1 patch (14 mg total) onto the skin daily. (Patient not taking: Reported on 06/02/2017)    FAMILY HISTORY:  His indicated that his mother is deceased. He indicated that his father is deceased. He indicated that his sister is alive.   SOCIAL HISTORY: He  reports that he has been smoking cigarettes.  He has been smoking about 1.00 pack per day. he has never used smokeless tobacco. He reports that he does not drink alcohol or use drugs.  REVIEW OF SYSTEMS:   Unable to obtain   VITAL SIGNS: BP 114/66   Pulse 80   Temp 100 F (37.8 C)   Resp (!) 24   Ht _0  (1.854 m)   Wt 78.6 kg (173 lb 4.5 oz)   SpO2 100%   BMI 22.86 kg/m      VENTILATOR SETTINGS: Vent Mode: PRVC FiO2 (%):  [35 %-80 %] 35 % Set Rate:  [24 bmp] 24 bmp Vt Set:  [640 mL] 640 mL PEEP:  [5 cmH20] 5 cmH20 Plateau Pressure:  [16 cmH20-21 cmH20] 19 cmH20  INTAKE / OUTPUT: I/O last 3 completed shifts: In: 2641.3 [I.V.:1341.3; IV Piggyback:1300] Out: 1200 [Urine:1200]  Physical Exam General: chronically ill appearing male, NAD Neuro:  HEENT: Pump Back/AT, PERRL, EOM-I and MMM Heart: RRR, Nl S1/S2, and -M/R/G. Lung: Coarse BS diffusely Abdomen: Soft, NT, ND and B+S Ext: -edema and -tenderness Skin: Intact  LABS:  BMET Recent Labs  Lab 07/24/17 0211 07/28/17 0813 07/28/17 0829 07/29/17 0331  NA 135 136 138 140  K 4.7 5.6* 5.7* 4.3  CL 107 106 110 108  CO2 20* 13*  --  20*  BUN 21* 23* 29* 24*  CREATININE 1.51* 1.73* 1.40* 1.93*  GLUCOSE 182* 241* 241* 101*   Electrolytes Recent Labs  Lab 07/24/17 0211 07/28/17 0813 07/29/17 0331  CALCIUM 8.4* 9.1 8.5*  MG  --   --  1.3*  PHOS  --   --  3.1   CBC Recent Labs  Lab 07/23/17 0706 07/28/17 0813 07/28/17 0829  WBC 10.2 10.5  --   HGB 10.1* 11.1* 11.9*  HCT 30.8* 34.2* 35.0*  PLT 187 271  --    Coag's Recent Labs  Lab 07/23/17 0706 07/28/17 1207  APTT 37* 33  INR 1.24 1.35   Sepsis Markers Recent Labs  Lab  07/28/17 1012 07/28/17 1207 07/28/17 1356  LATICACIDVEN 4.72* 3.2* 2.6*  PROCALCITON  --  <0.10  --    ABG Recent Labs  Lab 07/28/17 0820 07/28/17 1424  PHART 7.314* 7.398  PCO2ART 24.2* 30.4*  PO2ART 67.0* 277.0*   Liver Enzymes Recent Labs  Lab 07/23/17 0706 07/28/17 0813  AST 13* 94*  ALT 15* 91*  ALKPHOS 77 119  BILITOT 0.6 0.8  ALBUMIN 2.9* 3.3*   Cardiac Enzymes Recent Labs  Lab 07/28/17 0813  TROPONINI 0.08*   Glucose Recent Labs  Lab 07/28/17 0802 07/28/17 1222 07/28/17 1532 07/28/17 2019 07/28/17 2333 07/29/17 0340  GLUCAP 234* 188* 161* 98 117* 94   Imaging Dg Abd Portable 1v  Result Date: 07/28/2017 CLINICAL DATA:  Orogastric tube placement. EXAM: PORTABLE ABDOMEN - 1 VIEW COMPARISON:  Radiograph of same day. FINDINGS: The bowel gas pattern is normal. Distal tip  of nasogastric tube is seen in expected position of proximal stomach. Surgical clips are noted in the left upper quadrant. IMPRESSION: Distal tip of nasogastric tube seen in expected position of proximal stomach. No evidence of bowel obstruction or ileus. Electronically Signed   By: Marijo Conception, M.D.   On: 07/28/2017 21:27   Dg Abd Portable 1v  Result Date: 07/28/2017 CLINICAL DATA:  Evaluate for orogastric tube placement. EXAM: PORTABLE ABDOMEN - 1 VIEW COMPARISON:  CT abdomen and pelvis from earlier on the same day. FINDINGS: Motion artifacts are seen in the upper abdomen. There is a gastric tube that extends below the left hemidiaphragm with what appears to be the side port of the gastric tube below the left hemidiaphragm as well allowing for motion artifacts. The included heart is borderline enlarged with median sternotomy sutures. Bowel gas pattern is unremarkable. IMPRESSION: Motion related artifacts limit assessment. There appears to be a gastric tube with tip and side port below the left hemidiaphragm, in the expected location of the stomach similar to CT findings. To be more certain  of gastric tube positioning within the stomach, further advancement is suggested by 3-4 cm. Electronically Signed   By: Ashley Royalty M.D.   On: 07/28/2017 21:25     STUDIES:  CTH>>> CTC>>> CT AP>>>  CXR 12/11 FINDINGS: The endotracheal tube tip lies approximately 5.8 cm above the carina. The lungs are well-expanded. The perihilar lung markings remain increased. There is no pneumothorax or pleural effusion. The heart is normal in size. The pulmonary vascularity is engorged. There are post CABG changes. There are broken sternal wires present which are unchanged. The esophagogastric tube tip projects below the inferior margin of the image.  IMPRESSION: The endotracheal tube tip lies 5.8 cm above the carina.  Pulmonary interstitial and early alveolar edema. No discrete pneumonia.  CULTURES: Blood cultures x2 12/11>>> Tracheal aspirate 12/11>>>  ANTIBIOTICS: Vancomycin 12/11>> Zosyn 12/11>>>  SIGNIFICANT EVENTS: 12/11 admitted with AMS, acute hypoxemic respiratory failure and intubated  LINES/TUBES: ETT 12/11 Foley 12/11  DISCUSSION: admission discussed with sister Jerene Pitch ([phone]) by ED physician 12/11  ASSESSMENT / PLAN: 66 year old male history of coronary artery disease status post recent NSTEMI with stent placement 06/2017, hypertension, hyperlipidemia who presents with acute worsening of chronic dyspnea, found to be in acute hypoxemic respiratory failure requiring intubation along with metabolic encephalopathy.  Unclear cause, evidence of pulmonary vascular congestion and JVD on exam concerning for acute decompensated heart failure due to flash pulmonary edema  Addendum: CT chest reviewed with concern for endobronchial obstruction, as well as slight interval increase in seize of RUL nodule.  Given this a flexible bronchoscopy was performed at bedside which revealed no evidence of obstruction.  There were no secretions, thin or otherwise.  A bronchoalveolar  lavage was performed in the RML and sent for micro/cell counts/cytology.    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  Continue to wean ventilator based on pulse oximetry - Daily spontaneous awakening trial and spontaneous breathing trial - Will need follow up vocal cord inspection once stable, can be deferred to outpatient  CARDIOVASCULAR A:  Suspected decompensated heart failure and flash pulmonary edema  P:  - Lasix  for diuresis goal net negative 1-2L - D/C nitro drip - Restarted home isosorbide and beta blockers - Continue home aspirin/statin/plavix  RENAL A:   AKI - most likely prerenal/ATN   P:   - Hold home lisinopril with AKI and hyperkalemia, can restart if renal function and hyerpkalemia stable/improving   GASTROINTESTINAL A:   Mild transaminitis - suspect due to acute respiratory failure  P:   - trend to be sure they are downtrending with stabilization  HEMATOLOGIC A:   No active issues P: - CBC in AM - Transfuse per ICU protocol  INFECTIOUS A:   Hypoxemic respiratory failure of unclear cause  P:   - We will continue empiric vancomycin and Zosyn while his blood/sputum cultures are pending.  We will also check a pro-calcitonin and trend in addition, likely discontinue antibiotics if respiratory failure improving, remains afebrile without leukocytosis or other evidence of infection - Follow up on imaging as above for possible source of infection  ENDOCRINE A:   Diabetes mellitus   P:   - Hold home metformin, sliding scale insulin  NEUROLOGIC A:   Acute encephalopathy, presumed metabolic due to  hypoxemia  P:   RASS goal: 0 - Per family was not complaining of any focal neuro deficits.  Head CT negative - Minimize sedation as able  FAMILY  - Updates:  Sister Gretel Acre confirmed he is full code 07/28/17 with Dr Ellender Hose in the ED  - Inter-disciplinary family meet or Palliative Care meeting due by:  day 7  The patient is critically ill with multiple organ systems failure and requires high complexity decision making for assessment and support, frequent evaluation and titration of therapies, application of advanced monitoring technologies and extensive interpretation of multiple databases.   Critical Care Time devoted to patient care services described in this note is  35  Minutes. This time reflects time of care of this signee Dr Jennet Maduro. This critical care time does not reflect procedure time, or teaching time or supervisory time of PA/NP/Med student/Med Resident etc but could involve care discussion time.  Rush Farmer, M.D. [facility] Pulmonary/Critical Care Medicine. Pager: [phone]. After hours pager: [phone].  07/29/2017, 11:31 AM

## 2017-07-30 ENCOUNTER — Inpatient Hospital Stay (HOSPITAL_COMMUNITY): Payer: Medicare HMO

## 2017-07-30 DIAGNOSIS — J810 Acute pulmonary edema: Secondary | ICD-10-CM

## 2017-07-30 LAB — ACID FAST SMEAR (AFB): ACID FAST SMEAR - AFSCU2: NEGATIVE

## 2017-07-30 LAB — GLUCOSE, CAPILLARY
GLUCOSE-CAPILLARY: 106 mg/dL — AB (ref 65–99)
GLUCOSE-CAPILLARY: 110 mg/dL — AB (ref 65–99)
GLUCOSE-CAPILLARY: 132 mg/dL — AB (ref 65–99)
GLUCOSE-CAPILLARY: 168 mg/dL — AB (ref 65–99)
GLUCOSE-CAPILLARY: 221 mg/dL — AB (ref 65–99)
GLUCOSE-CAPILLARY: 67 mg/dL (ref 65–99)
GLUCOSE-CAPILLARY: 74 mg/dL (ref 65–99)
GLUCOSE-CAPILLARY: 97 mg/dL (ref 65–99)
Glucose-Capillary: 149 mg/dL — ABNORMAL HIGH (ref 65–99)

## 2017-07-30 LAB — POCT I-STAT 3, ART BLOOD GAS (G3+)
ACID-BASE DEFICIT: 3 mmol/L — AB (ref 0.0–2.0)
BICARBONATE: 18.6 mmol/L — AB (ref 20.0–28.0)
O2 SAT: 98 %
Patient temperature: 100.2
TCO2: 19 mmol/L — AB (ref 22–32)
pCO2 arterial: 24.5 mmHg — ABNORMAL LOW (ref 32.0–48.0)
pH, Arterial: 7.492 — ABNORMAL HIGH (ref 7.350–7.450)
pO2, Arterial: 96 mmHg (ref 83.0–108.0)

## 2017-07-30 LAB — BASIC METABOLIC PANEL
Anion gap: 12 (ref 5–15)
BUN: 25 mg/dL — AB (ref 6–20)
CHLORIDE: 106 mmol/L (ref 101–111)
CO2: 21 mmol/L — ABNORMAL LOW (ref 22–32)
Calcium: 8.4 mg/dL — ABNORMAL LOW (ref 8.9–10.3)
Creatinine, Ser: 2 mg/dL — ABNORMAL HIGH (ref 0.61–1.24)
GFR calc Af Amer: 39 mL/min — ABNORMAL LOW (ref >60)
GFR calc non Af Amer: 33 mL/min — ABNORMAL LOW (ref >60)
GLUCOSE: 143 mg/dL — AB (ref 65–99)
POTASSIUM: 3.6 mmol/L (ref 3.5–5.1)
SODIUM: 139 mmol/L (ref 135–145)

## 2017-07-30 LAB — CBC
HCT: 30.4 % — ABNORMAL LOW (ref 39.0–52.0)
HEMOGLOBIN: 9.9 g/dL — AB (ref 13.0–17.0)
MCH: 26.7 pg (ref 26.0–34.0)
MCHC: 32.6 g/dL (ref 30.0–36.0)
MCV: 81.9 fL (ref 78.0–100.0)
Platelets: 177 10*3/uL (ref 150–400)
RBC: 3.71 MIL/uL — AB (ref 4.22–5.81)
RDW: 14.6 % (ref 11.5–15.5)
WBC: 11.3 10*3/uL — ABNORMAL HIGH (ref 4.0–10.5)

## 2017-07-30 LAB — MAGNESIUM: MAGNESIUM: 1.6 mg/dL — AB (ref 1.7–2.4)

## 2017-07-30 LAB — PHOSPHORUS: Phosphorus: 2.9 mg/dL (ref 2.5–4.6)

## 2017-07-30 MED ORDER — FUROSEMIDE 10 MG/ML IJ SOLN
40.0000 mg | Freq: Every day | INTRAMUSCULAR | Status: DC
  Administered 2017-07-30 – 2017-08-01 (×3): 40 mg via INTRAVENOUS
  Filled 2017-07-30 (×4): qty 4

## 2017-07-30 MED ORDER — FUROSEMIDE 10 MG/ML IJ SOLN: 40.0000 mg | Freq: Two times a day (BID) | INTRAMUSCULAR | Status: DC

## 2017-07-30 MED ORDER — POTASSIUM CHLORIDE 20 MEQ/15ML (10%) PO SOLN
40.0000 meq | Freq: Once | ORAL | Status: AC
  Administered 2017-07-30: 40 meq
  Filled 2017-07-30: qty 30

## 2017-07-30 MED ORDER — DEXTROSE 50 % IV SOLN
INTRAVENOUS | Status: AC
  Filled 2017-07-30: qty 50

## 2017-07-30 MED ORDER — MAGNESIUM SULFATE 2 GM/50ML IV SOLN
2.0000 g | Freq: Once | INTRAVENOUS | Status: AC
  Administered 2017-07-30: 2 g via INTRAVENOUS
  Filled 2017-07-30: qty 50

## 2017-07-30 MED ORDER — DEXTROSE 50 % IV SOLN
25.0000 mL | Freq: Once | INTRAVENOUS | Status: AC
  Administered 2017-07-30: 25 mL via INTRAVENOUS
  Filled 2017-07-30: qty 50

## 2017-07-30 NOTE — Plan of Care (Signed)
  Progressing Health Behavior/Discharge Planning: Ability to manage health-related needs will improve 07/30/2017 0055 - Progressing by Della Gooummings, Serita Degroote R, RN Clinical Measurements: Ability to maintain clinical measurements within normal limits will improve 07/30/2017 0055 - Progressing by Della Gooummings, Javae Braaten R, RN Will remain free from infection 07/30/2017 0055 - Progressing by Della Gooummings, Mclane Arora R, RN Diagnostic test results will improve 07/30/2017 0055 - Progressing by Della Gooummings, Kandon Hosking R, RN Respiratory complications will improve 07/30/2017 0055 - Progressing by Della Gooummings, Enzley Kitchens R, RN Cardiovascular complication will be avoided 07/30/2017 0055 - Progressing by Della Gooummings, Tremeka Helbling R, RN Activity: Risk for activity intolerance will decrease 07/30/2017 0055 - Progressing by Della Gooummings, Noma Quijas R, RN Note Rotated q2hrs in bed, tolerated well. Nutrition: Adequate nutrition will be maintained 07/30/2017 0055 - Progressing by Della Gooummings, Arnel Wymer R, RN Note Tube feedings in place, rate increased per MD orders, not yet at goal rate. Coping: Level of anxiety will decrease 07/30/2017 0055 - Progressing by Della Gooummings, Nohlan Burdin R, RN Note Fentanyl gtt in place, boluses given PRN. Elimination: Will not experience complications related to bowel motility 07/30/2017 0055 - Progressing by Della Gooummings, Carolann Brazell R, RN Note LBM PTA. Will not experience complications related to urinary retention 07/30/2017 0055 - Progressing by Della Gooummings, Ian Cavey R, RN Note Foley in place, lasix ordered, see MAR. Pain Managment: General experience of comfort will improve 07/30/2017 0055 - Progressing by Della Gooummings, Aireanna Luellen R, RN Note Fentanyl gtt infusing Safety: Ability to remain free from injury will improve 07/30/2017 0055 - Progressing by Della Gooummings, Imran Nuon R, RN Note High fall risk, bed alarm in place, not attempting to get up at this time. Skin Integrity: Risk for impaired skin integrity will decrease 07/30/2017  0055 - Progressing by Della Gooummings, Evangelyn Crouse R, RN Note Rotated q2hrs, skin intact. Activity: Ability to tolerate increased activity will improve 07/30/2017 0055 - Progressing by Della Gooummings, Jashayla Glatfelter R, RN Respiratory: Ability to maintain a clear airway and adequate ventilation will improve 07/30/2017 0055 - Progressing by Della Gooummings, Leland Raver R, RN Role Relationship: Method of communication will improve 07/30/2017 0055 - Progressing by Della Gooummings, Tracey Stewart R, RN

## 2017-07-30 NOTE — Care Management Note (Signed)
Case Management Note  Patient Details  Name: Jordan Gardner MRN: [id number] Date of Birth: 06/03/1951  Subjective/Objective:   Pt admitted with SOB                 Action/Plan:  PTA from home with sister - active with [facility] - agency aware of admit.     Expected Discharge Date:                  Expected Discharge Plan:     In-House Referral:     Discharge planning Services     Post Acute Care Choice:    Choice offered to:     DME Arranged:    DME Agency:     HH Arranged:    HH Agency:     Status of Service:     If discussed at [employer] Meetings, dates discussed:    Additional Comments:  Cherylann ParrClaxton, Ngoc Detjen S, RN 07/30/2017, 3:13 PM

## 2017-07-30 NOTE — Procedures (Signed)
Extubation Procedure Note  Patient Details:   Name: Jordan Gardner DOB: 05/17/1951 MRN: [id number]   Airway Documentation:     Evaluation  O2 sats: stable throughout Complications: No apparent complications Patient did tolerate procedure well. Bilateral Breath Sounds: Clear, Diminished   Yes   Positive cuff leak noted.  Pt placed on [place] 4 L with humidity, no stridor noted, Pt able to reach 750 using incentive spirometer, pt able to use flutter valve with good technique.  Rayburn FeltJean S Charnelle Bergeman 07/30/2017, 12:38 PM

## 2017-07-30 NOTE — Progress Notes (Signed)
PULMONARY / CRITICAL CARE MEDICINE   Name: Jordan Gardner MRN: [id number] DOB: 10/14/1950    ADMISSION DATE:  07/28/2017 CONSULTATION DATE: 07/28/17  REFERRING MD:   Duffy Bruce  CHIEF COMPLAINT:  Shortness of breath  HISTORY OF PRESENT ILLNESS:   66 year old male history of coronary artery disease status post recent NSTEMI with stent placement 06/2017, hypertension, hyperlipidemia who presents with acute worsening of chronic dyspnea.  Patient was intubated on arrival to the emergency department and is unable to provide any history however the emergency room provider who spoke to his sister states that he lives with her, has some developmental delays, and was complaining of dyspnea since around the time of his end STEMI in November.  This morning she called him and stated that he was having nausea and vomiting, which per her is not unusual for him, but then later complained of severe acute pain worsening shortness of breath.  She stated that he was not having any fevers, chills, chest pain.  No other localizing complaints.  In the emergency department he was noted to be confused, lethargic.  He was tried on BiPAP however his blood gas was concerning for progressive respiratory acidosis and he was intubated for presumed impending respiratory failure with etomidate and rocuronium.  He was also given a dose each of vancomycin and Zosyn, bolused 1 L of IV fluids.  He was sedated on propofol on the time of my examination.  SUBJECTIVE:  RN reports pt attempted PSV wean earlier in am, too sleepy to do work of breathing.  Concerned over Sr Cr / lasix dosing.  No acute events overnight.   VITAL SIGNS: BP (!) 144/68   Pulse 64   Temp 99.3 F (37.4 C)   Resp (!) 8   Ht _0  (1.854 m)   Wt 170 lb 3.1 oz (77.2 kg)   SpO2 100%   BMI 22.45 kg/m      VENTILATOR SETTINGS: Vent Mode: PRVC FiO2 (%):  [30 %] 30 % Set Rate:  [24 bmp] 24 bmp Vt Set:  [640 mL] 640 mL PEEP:  [5 cmH20] 5  cmH20 Plateau Pressure:  [18 cmH20-20 cmH20] 19 cmH20  INTAKE / OUTPUT: I/O last 3 completed shifts: In: 2078.4 [I.V.:572.8; NG/GT:805.7; IV Piggyback:700] Out: 3205 [OZDGU:4403]  Physical Exam General: well developed adult male in NAD on vent HEENT: MM pink/moist, ETT Neuro: Awakens to voice, follows commands / appropriate, MAE CV: s1s2 rrr, no m/r/g PULM: even/non-labored, lungs bilaterally clear, pulling 1L+ on 5/5  KV:QQVZ, non-tender, bsx4 active  Extremities: warm/dry, no edema  Skin: no rashes or lesions  LABS:  BMET Recent Labs  Lab 07/28/17 0813 07/28/17 0829 07/29/17 0331 07/30/17 0459  NA 136 138 140 139  K 5.6* 5.7* 4.3 3.6  CL 106 110 108 106  CO2 13*  --  20* 21*  BUN 23* 29* 24* 25*  CREATININE 1.73* 1.40* 1.93* 2.00*  GLUCOSE 241* 241* 101* 143*   Electrolytes Recent Labs  Lab 07/28/17 0813 07/29/17 0331 07/30/17 0459  CALCIUM 9.1 8.5* 8.4*  MG  --  1.3* 1.6*  PHOS  --  3.1 2.9   CBC Recent Labs  Lab 07/28/17 0813 07/28/17 0829 07/30/17 0459  WBC 10.5  --  11.3*  HGB 11.1* 11.9* 9.9*  HCT 34.2* 35.0* 30.4*  PLT 271  --  177   Coag's Recent Labs  Lab 07/28/17 1207  APTT 33  INR 1.35   Sepsis Markers Recent Labs  Lab 07/28/17 1012 07/28/17  1207 07/28/17 1356  LATICACIDVEN 4.72* 3.2* 2.6*  PROCALCITON  --  <0.10  --    ABG Recent Labs  Lab 07/28/17 0820 07/28/17 1424 07/30/17 0409  PHART 7.314* 7.398 7.492*  PCO2ART 24.2* 30.4* 24.5*  PO2ART 67.0* 277.0* 96.0   Liver Enzymes Recent Labs  Lab 07/28/17 0813  AST 94*  ALT 91*  ALKPHOS 119  BILITOT 0.8  ALBUMIN 3.3*   Cardiac Enzymes Recent Labs  Lab 07/28/17 0813  TROPONINI 0.08*   Glucose Recent Labs  Lab 07/29/17 1611 07/29/17 1823 07/29/17 1954 07/30/17 0034 07/30/17 0410 07/30/17 0759  GLUCAP 86 89 110* 132* 106* 168*   Imaging Dg Chest Port 1 View  Result Date: 07/30/2017 CLINICAL DATA:  Intubation. EXAM: PORTABLE CHEST 1 VIEW COMPARISON:   CT 07/28/2017.  Chest x-ray 07/28/2017. FINDINGS: Endotracheal tube and NG tube in stable position. Prior CABG. Heart size normal. Improvement aeration scratched it significant improvement in aeration both lungs with persistent atelectasis/ infiltrate right lung base. No prominent pleural effusion or pneumothorax. Left costophrenic angle not completely imaged. IMPRESSION: 1. Endotracheal tube and NG tube in stable position. 2. Significant improvement aeration both lungs with persist atelectasis/infiltrate right lung base. 3.  Prior CABG.  Heart size normal.  No pulmonary venous congestion. Electronically Signed   By: Shedd   On: 07/30/2017 06:34    STUDIES:  [facility] 12/11 >> mild atrophic changes & chronic white matter ischemic changes from prior exam, no acute findings CTC 12/11 >> bibasilar & RML consolidation, mild midline shift to the R, bronchus intermedius may contain fluid or obstructing lesion, RUL pulmonary nodule  CT AP 12/11 >> no free air, atherosclerotic changes of the abdominal aorta with ectasia with focal aneurysm, atherosclerotic changes of aortic branch vessels with mild ectasia internal iliac arteries  CULTURES: Blood cultures x2 12/11 >>  BAL 12/11 >> FOB AFB 12/11 >> negative smear >> UC 12/11 >> negative   ANTIBIOTICS: Vancomycin 12/11 >> 12/13 Zosyn 12/11 >>  SIGNIFICANT EVENTS: 12/11 Admitted with AMS, acute hypoxemic respiratory failure and intubated  LINES/TUBES: ETT 12/11 >> Foley 12/11 >>  DISCUSSION: 66 year old male history of coronary artery disease status post recent NSTEMI with stent placement 06/2017, hypertension, hyperlipidemia who presents with acute worsening of chronic dyspnea, found to be in acute hypoxemic respiratory failure requiring intubation along with metabolic encephalopathy.  Evidence of pulmonary vascular congestion and JVD on exam concerning for acute decompensated heart failure due to flash pulmonary edema & CT chest reviewed with  concern for endobronchial obstruction, as well as slight interval increase in seize of RUL nodule.  Given this a flexible bronchoscopy was performed at bedside which revealed no evidence of obstruction.  There were no secretions, thin or otherwise.  A bronchoalveolar lavage was performed in the RML and sent for micro/cell counts/cytology.    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 imaging per radiology recommendations P:  Lasix 40 mg QD  Continue home  isorsorbide, Beta blocker, ASA, statin, plavix ICU monitoring  Negative balance as able   RENAL A:   AKI - most likely prerenal/ATN  P:   Trend BMP / urinary output Replace electrolytes as indicated Avoid nephrotoxic agents, ensure adequate renal perfusion Hold home lisinopril  Reduce lasix dosing Discontinue vancomycin    GASTROINTESTINAL A:   Mild transaminitis - suspect due to acute respiratory failure P:   NPO  TF per Nutrition  PPI   HEMATOLOGIC A:   No active issues P: Trend CBC  Transfuse per ICU guidelines, Hgb <7%  INFECTIOUS A:   Hypoxemic respiratory failure of unclear cause P:   ABX as above Discontinue vancomycin  Follow cultures  Trend PCT   ENDOCRINE A:   Diabetes mellitus   P:   SSI   NEUROLOGIC A:   Acute encephalopathy, presumed metabolic due to hypoxemia P:   RASS goal:0 Minimize sedation  Early PT efforts   FAMILY  - Updates: Sister Jerene Pitch  [phone]).  No family at bedside am 12/13.   - Inter-disciplinary family meet or Palliative Care meeting due by:  day 7   CC Time:  30 minutes  Noe Gens, NP-C [facility] Pgr: [phone] or if no answer [phone] 07/30/2017, 11:20 AM  Attending Note:  66 year old male with PMH of CHF who presented with acute pulmonary edema and respiratory failure.  Patient was intubated and a CT of the chest was done that revealed enlarging nodule and bronchial obstruction.  Bronch was done that showed no obstruction and washing were sent that were all negative.  On exam, he is much more alert and interactive this AM.  Weaning very well.  I reviewed CXR myself, ETT ok and improving edema.  Will proceed with extubation.  Ambulate and PT/OT/SLP.  Titrate O2 down.  IS and flutter valve.  Keep dry.  The patient is critically ill with multiple organ systems failure and requires high complexity decision making for assessment and support, frequent evaluation and titration of therapies, application of advanced monitoring technologies and extensive interpretation of multiple databases.   Critical Care Time devoted to patient care services described in this note is  35  Minutes. This time reflects time of care of this signee Dr Jennet Maduro. This critical care time does not reflect procedure time, or teaching time or supervisory time of PA/NP/Med student/Med Resident etc but could involve care discussion time.  Rush Farmer, M.D. [facility] Pulmonary/Critical Care Medicine. Pager: [phone]. After hours pager: [phone].

## 2017-07-31 ENCOUNTER — Inpatient Hospital Stay (HOSPITAL_COMMUNITY): Payer: Medicare HMO

## 2017-07-31 LAB — BASIC METABOLIC PANEL
ANION GAP: 12 (ref 5–15)
BUN: 21 mg/dL — ABNORMAL HIGH (ref 6–20)
CALCIUM: 8.3 mg/dL — AB (ref 8.9–10.3)
CHLORIDE: 103 mmol/L (ref 101–111)
CO2: 22 mmol/L (ref 22–32)
CREATININE: 1.57 mg/dL — AB (ref 0.61–1.24)
GFR calc non Af Amer: 45 mL/min — ABNORMAL LOW (ref >60)
GFR, EST AFRICAN AMERICAN: 52 mL/min — AB (ref >60)
Glucose, Bld: 108 mg/dL — ABNORMAL HIGH (ref 65–99)
Potassium: 3.4 mmol/L — ABNORMAL LOW (ref 3.5–5.1)
SODIUM: 137 mmol/L (ref 135–145)

## 2017-07-31 LAB — CBC
HEMATOCRIT: 32.4 % — AB (ref 39.0–52.0)
HEMOGLOBIN: 10.8 g/dL — AB (ref 13.0–17.0)
MCH: 27.3 pg (ref 26.0–34.0)
MCHC: 33.3 g/dL (ref 30.0–36.0)
MCV: 82 fL (ref 78.0–100.0)
Platelets: 188 10*3/uL (ref 150–400)
RBC: 3.95 MIL/uL — ABNORMAL LOW (ref 4.22–5.81)
RDW: 14.6 % (ref 11.5–15.5)
WBC: 11.3 10*3/uL — AB (ref 4.0–10.5)

## 2017-07-31 LAB — PHOSPHORUS: PHOSPHORUS: 4.4 mg/dL (ref 2.5–4.6)

## 2017-07-31 LAB — GLUCOSE, CAPILLARY
GLUCOSE-CAPILLARY: 110 mg/dL — AB (ref 65–99)
GLUCOSE-CAPILLARY: 119 mg/dL — AB (ref 65–99)
GLUCOSE-CAPILLARY: 303 mg/dL — AB (ref 65–99)
GLUCOSE-CAPILLARY: 255 mg/dL — AB (ref 65–99)
Glucose-Capillary: 221 mg/dL — ABNORMAL HIGH (ref 65–99)
Glucose-Capillary: 236 mg/dL — ABNORMAL HIGH (ref 65–99)
Glucose-Capillary: 280 mg/dL — ABNORMAL HIGH (ref 65–99)

## 2017-07-31 LAB — CULTURE, RESPIRATORY: CULTURE: NORMAL

## 2017-07-31 LAB — MAGNESIUM: MAGNESIUM: 1.7 mg/dL (ref 1.7–2.4)

## 2017-07-31 LAB — CULTURE, RESPIRATORY W GRAM STAIN

## 2017-07-31 MED ORDER — METOPROLOL TARTRATE 50 MG PO TABS
50.0000 mg | ORAL_TABLET | Freq: Two times a day (BID) | ORAL | Status: DC
  Administered 2017-07-31 – 2017-08-04 (×9): 50 mg via ORAL
  Filled 2017-07-31: qty 1
  Filled 2017-07-31 (×3): qty 2
  Filled 2017-07-31 (×5): qty 1
  Filled 2017-07-31: qty 2

## 2017-07-31 MED ORDER — ASPIRIN 81 MG PO CHEW
81.0000 mg | CHEWABLE_TABLET | Freq: Every day | ORAL | Status: DC
  Administered 2017-07-31 – 2017-08-04 (×5): 81 mg via ORAL
  Filled 2017-07-31 (×5): qty 1

## 2017-07-31 MED ORDER — MIRTAZAPINE 15 MG PO TABS
15.0000 mg | ORAL_TABLET | Freq: Every day | ORAL | Status: DC
  Administered 2017-07-31 – 2017-08-03 (×4): 15 mg via ORAL
  Filled 2017-07-31 (×4): qty 1

## 2017-07-31 MED ORDER — ASPIRIN 81 MG PO CHEW: 81.0000 mg | CHEWABLE_TABLET | Freq: Every day | ORAL | Status: DC

## 2017-07-31 MED ORDER — INSULIN ASPART 100 UNIT/ML ~~LOC~~ SOLN
0.0000 [IU] | Freq: Three times a day (TID) | SUBCUTANEOUS | Status: DC
  Administered 2017-07-31: 5 [IU] via SUBCUTANEOUS
  Administered 2017-08-01 (×2): 2 [IU] via SUBCUTANEOUS
  Administered 2017-08-01: 3 [IU] via SUBCUTANEOUS
  Administered 2017-08-02 (×2): 2 [IU] via SUBCUTANEOUS
  Administered 2017-08-02 – 2017-08-03 (×2): 3 [IU] via SUBCUTANEOUS
  Administered 2017-08-03: 1 [IU] via SUBCUTANEOUS
  Administered 2017-08-03: 2 [IU] via SUBCUTANEOUS
  Administered 2017-08-04: 3 [IU] via SUBCUTANEOUS
  Administered 2017-08-04: 1 [IU] via SUBCUTANEOUS

## 2017-07-31 MED ORDER — SERTRALINE HCL 100 MG PO TABS
100.0000 mg | ORAL_TABLET | Freq: Every day | ORAL | Status: DC
  Administered 2017-07-31 – 2017-08-04 (×5): 100 mg via ORAL
  Filled 2017-07-31 (×6): qty 1

## 2017-07-31 MED ORDER — INSULIN ASPART 100 UNIT/ML ~~LOC~~ SOLN
0.0000 [IU] | Freq: Every day | SUBCUTANEOUS | Status: DC
Start: 2017-07-31 — End: 2017-08-04
  Administered 2017-07-31 – 2017-08-02 (×2): 2 [IU] via SUBCUTANEOUS

## 2017-07-31 MED ORDER — ATORVASTATIN CALCIUM 80 MG PO TABS
80.0000 mg | ORAL_TABLET | Freq: Every day | ORAL | Status: DC
  Administered 2017-07-31 – 2017-08-03 (×4): 80 mg via ORAL
  Filled 2017-07-31 (×4): qty 1

## 2017-07-31 MED ORDER — SERTRALINE HCL 100 MG PO TABS: 100.0000 mg | ORAL_TABLET | Freq: Every day | ORAL | Status: DC

## 2017-07-31 MED ORDER — POTASSIUM CHLORIDE CRYS ER 20 MEQ PO TBCR
20.0000 meq | EXTENDED_RELEASE_TABLET | ORAL | Status: AC
  Administered 2017-07-31 (×2): 20 meq via ORAL
  Filled 2017-07-31 (×2): qty 1

## 2017-07-31 MED ORDER — PANTOPRAZOLE SODIUM 40 MG PO TBEC
40.0000 mg | DELAYED_RELEASE_TABLET | Freq: Every day | ORAL | Status: DC
  Administered 2017-07-31 – 2017-08-04 (×5): 40 mg via ORAL
  Filled 2017-07-31 (×5): qty 1

## 2017-07-31 MED ORDER — DOCUSATE SODIUM 50 MG/5ML PO LIQD
100.0000 mg | Freq: Two times a day (BID) | ORAL | Status: DC | PRN
  Filled 2017-07-31: qty 10

## 2017-07-31 MED ORDER — MAGNESIUM SULFATE 2 GM/50ML IV SOLN
2.0000 g | Freq: Once | INTRAVENOUS | Status: AC
  Administered 2017-07-31: 2 g via INTRAVENOUS
  Filled 2017-07-31: qty 50

## 2017-07-31 MED ORDER — METOPROLOL TARTRATE 25 MG PO TABS: 50.0000 mg | ORAL_TABLET | Freq: Two times a day (BID) | ORAL | Status: DC

## 2017-07-31 MED ORDER — CLOPIDOGREL BISULFATE 75 MG PO TABS
75.0000 mg | ORAL_TABLET | Freq: Every day | ORAL | Status: DC
  Administered 2017-07-31 – 2017-08-04 (×5): 75 mg via ORAL
  Filled 2017-07-31 (×6): qty 1

## 2017-07-31 MED ORDER — CLOPIDOGREL BISULFATE 75 MG PO TABS: 75.0000 mg | ORAL_TABLET | Freq: Every day | ORAL | Status: DC

## 2017-07-31 NOTE — Evaluation (Signed)
Physical Therapy Evaluation Patient Details Name: Jordan Gardner MRN: [id number] DOB: 12/30/1950 Today's Date: 07/31/2017   History of Present Illness  Pt is a 66 y/o male admitted secondary to difficulty breathing and was intubated in the ED. Pt was intubated from 12/11 to 12/13. Of note, pt with recent NSTEMI with stent in November. PMH including but not limited to developmental delay, DM, HTN, HLD, hx of CVA and ascending thoracic aortic aneurysm.   Clinical Impression  Pt presented supine in bed with HOB elevated, awake and willing to participate in therapy session. Prior to admission, pt reported that he ambulated with use of either a RW or SPC and required assistance from his sister for ADLs. Pt reported that he lives with his sister who is there "pretty much all the time". No family or caregivers were present during evaluation to confirm this information. Pt currently requires close min guard for safety with bed mobility and min-mod A x2 for transfers with use of RW. Pt on RA throughout session with SPO2 maintaining at 98-100%. All VSS throughout. Pt would continue to benefit from skilled physical therapy services at this time while admitted and after d/c to address the below listed limitations in order to improve overall safety and independence with functional mobility.     Follow Up Recommendations SNF;Supervision/Assistance - 24 hour    Equipment Recommendations  None recommended by PT    Recommendations for Other Services       Precautions / Restrictions Precautions Precautions: Fall Restrictions Weight Bearing Restrictions: No      Mobility  Bed Mobility Overal bed mobility: Needs Assistance Bed Mobility: Supine to Sit     Supine to sit: Min guard     General bed mobility comments: close min guard for safety, increased time and effort  Transfers Overall transfer level: Needs assistance Equipment used: None;Rolling walker (2 wheeled) Transfers: Sit to/from  UG[employer] CorporationStand;Stand Pivot Transfers Sit to Stand: Min assist;+2 safety/equipment Stand pivot transfers: Min assist;Mod assist;+2 safety/equipment;+2 physical assistance       General transfer comment: assist for safety and stability; pt was a bit impulsive initially but likely related to needing to have a BM. Therefore, first transfer was done without an AD and required heavier physical assistance than he did with use of RW  Ambulation/Gait                Stairs            Wheelchair Mobility    Modified Rankin (Stroke Patients Only)       Balance Overall balance assessment: Needs assistance Sitting-balance support: Feet supported Sitting balance-Leahy Scale: Good     Standing balance support: During functional activity;Bilateral upper extremity supported Standing balance-Leahy Scale: Poor                               Pertinent Vitals/Pain Pain Assessment: No/denies pain    Home Living Family/patient expects to be discharged to:: Private residence Living Arrangements: Other relatives;Other (Comment)(sister) Available Help at Discharge: Family;Available 24 hours/day;Other (Comment)(pt stated his sister is there "pretty much all the time") Type of Home: House Home Access: Stairs to enter Entrance Stairs-Rails: Right;Left Entrance Stairs-Number of Steps: 4 Home Layout: One level Home Equipment: Walker - 2 wheels;Cane - single point;Shower seat      Prior Function Level of Independence: Needs assistance   Gait / Transfers Assistance Needed: pt reported that he ambulates with either a RW or SPC  ADL's / Homemaking Assistance Needed: Pt stated that he needs assistance with ADLs from sister        Hand Dominance        Extremity/Trunk Assessment   Upper Extremity Assessment Upper Extremity Assessment: Overall WFL for tasks assessed    Lower Extremity Assessment Lower Extremity Assessment: Generalized weakness       Communication    Communication: No difficulties  Cognition Arousal/Alertness: Awake/alert Behavior During Therapy: Flat affect Overall Cognitive Status: History of cognitive impairments - at baseline                                 General Comments: pt with developmental delays at baseline per chart review. No family or caregivers present to determine baseline.      General Comments      Exercises     Assessment/Plan    PT Assessment Patient needs continued PT services  PT Problem List Decreased activity tolerance;Decreased balance;Decreased mobility;Decreased coordination;Decreased cognition;Decreased knowledge of use of DME;Decreased safety awareness;Decreased knowledge of precautions       PT Treatment Interventions DME instruction;Gait training;Stair training;Functional mobility training;Therapeutic activities;Therapeutic exercise;Balance training;Neuromuscular re-education;Patient/family education    PT Goals (Current goals can be found in the Care Plan section)  Acute Rehab PT Goals Patient Stated Goal: none stated in regards to therapy ("I need to go to the bathroom") PT Goal Formulation: Patient unable to participate in goal setting Time For Goal Achievement: 08/14/17 Potential to Achieve Goals: Good    Frequency Min 3X/week   Barriers to discharge        Co-evaluation               AM-PAC PT "6 Clicks" Daily Activity  Outcome Measure Difficulty turning over in bed (including adjusting bedclothes, sheets and blankets)?: A Lot Difficulty moving from lying on back to sitting on the side of the bed? : A Lot Difficulty sitting down on and standing up from a chair with arms (e.g., wheelchair, bedside commode, etc,.)?: Unable Help needed moving to and from a bed to chair (including a wheelchair)?: A Little Help needed walking in hospital room?: A Lot Help needed climbing 3-5 steps with a railing? : Total 6 Click Score: 11    End of Session Equipment Utilized  During Treatment: Gait belt Activity Tolerance: Patient limited by fatigue Patient left: in chair;with call bell/phone within reach;Other (comment)(no chair alarm pads available on unit) Nurse Communication: Mobility status;Other (comment)(no chair alarm pads available on unit) PT Visit Diagnosis: Other abnormalities of gait and mobility (R26.89)    Time: [id number] PT Time Calculation (min) (ACUTE ONLY): 30 min   Charges:   PT Evaluation $PT Eval Moderate Complexity: 1 Mod PT Treatments $Therapeutic Activity: 8-22 mins   PT G Codes:        FoleyJennifer Dreden Rivere, PT, DPT [phone]   Alessandra BevelsJennifer M Tashan Kreitzer 07/31/2017, 11:44 AM

## 2017-07-31 NOTE — Progress Notes (Signed)
[facility] FOR AM LAB REPLACEMENT ONLY  The patient does apply [facility] the [facility]ly[facility] Protocol based on the criteria listed below:   1. Is GFR >/= 40 ml/min? Yes.    Patient's GFR today is >52 2. Is urine output >/= 0.5 ml/kg/hr for the last 6 hours? Yes.   Patient's UOP is 1.2 ml/kg/hr 3. Is BUN < 60 mg/dL? Yes.    Patient's BUN today is 21 4. Abnormal electrolyte(s): 3.4 5. Ordered repletion with: per protocol 6. If a panic level lab has been reported, has the CCM MD in charge been notified? Yes.  .   Physician:  Dr. Ron ParkerPanchal  Trisha Morandi, Dixon BoosMaria Samson 07/31/2017 6:22 AM

## 2017-07-31 NOTE — Progress Notes (Signed)
PULMONARY / CRITICAL CARE MEDICINE   Name: Jordan Gardner MRN: [id number] DOB: November 15, 1950    ADMISSION DATE:  07/28/2017 CONSULTATION DATE: 07/28/17  REFERRING MD:   Duffy Bruce  CHIEF COMPLAINT:  Shortness of breath  HISTORY OF PRESENT ILLNESS:   66 yo male presented with dyspnea, nausea and vomiting.  Required intubation in ER.  Had admission in November 2018 for NSTEMI s/p stent.  PMHx of HTN, HLD, developmental delay.  SUBJECTIVE:  Denies chest pain.  VITAL SIGNS: BP (!) 119/108   Pulse 66   Temp 98.3 F (36.8 C) (Oral)   Resp 18   Ht _0  (1.854 m)   Wt 171 lb 8.3 oz (77.8 kg)   SpO2 100%   BMI 22.63 kg/m   INTAKE / OUTPUT: I/O last 3 completed shifts: In: 2060.7 [P.O.:480; I.V.:465.1; NG/GT:715.7; IV Piggyback:400] Out: 3685 [Urine:3685]  Physical Exam  General - pleasant Eyes - pupils reactive ENT - no sinus tenderness, no oral exudate, no LAN Cardiac - regular, no murmur Chest - no wheeze, rales Abd - soft, non tender Ext - no edema Skin - no rashes Neuro - normal strength Psych - normal mood   LABS:  BMET Recent Labs  Lab 07/29/17 0331 07/30/17 0459 07/31/17 0329  NA 140 139 137  K 4.3 3.6 3.4*  CL 108 106 103  CO2 20* 21* 22  BUN 24* 25* 21*  CREATININE 1.93* 2.00* 1.57*  GLUCOSE 101* 143* 108*   Electrolytes Recent Labs  Lab 07/29/17 0331 07/30/17 0459 07/31/17 0329  CALCIUM 8.5* 8.4* 8.3*  MG 1.3* 1.6* 1.7  PHOS 3.1 2.9 4.4   CBC Recent Labs  Lab 07/28/17 0813 07/28/17 0829 07/30/17 0459 07/31/17 0329  WBC 10.5  --  11.3* 11.3*  HGB 11.1* 11.9* 9.9* 10.8*  HCT 34.2* 35.0* 30.4* 32.4*  PLT 271  --  177 188   Coag's Recent Labs  Lab 07/28/17 1207  APTT 33  INR 1.35   Sepsis Markers Recent Labs  Lab 07/28/17 1012 07/28/17 1207 07/28/17 1356  LATICACIDVEN 4.72* 3.2* 2.6*  PROCALCITON  --  <0.10  --    ABG Recent Labs  Lab 07/28/17 0820 07/28/17 1424 07/30/17 0409  PHART 7.314* 7.398 7.492*   PCO2ART 24.2* 30.4* 24.5*  PO2ART 67.0* 277.0* 96.0   Liver Enzymes Recent Labs  Lab 07/28/17 0813  AST 94*  ALT 91*  ALKPHOS 119  BILITOT 0.8  ALBUMIN 3.3*   Cardiac Enzymes Recent Labs  Lab 07/28/17 0813  TROPONINI 0.08*   Glucose Recent Labs  Lab 07/30/17 1700 07/30/17 2018 07/30/17 2333 07/31/17 0313 07/31/17 0805 07/31/17 1137  GLUCAP 74 149* 97 110* 119* 303*   Imaging Dg Chest Port 1 View  Result Date: 07/31/2017 CLINICAL DATA:  Respiratory failure.  Hypoxia. EXAM: PORTABLE CHEST 1 VIEW COMPARISON:  07/30/2017. FINDINGS: Prior CABG. Fractured sternal wiring noted. Cardiomegaly. Persistent atelectasis/infiltrate right lung base with slight improvement. New onset left base atelectasis. Elevation left hemidiaphragm. No pleural effusion or pneumothorax. IMPRESSION: 1. Persistent but slightly improved right base atelectasis/ infiltrate. New onset left base atelectasis with elevation left hemidiaphragm. 2. Prior CABG.  Stable cardiomegaly. Electronically Signed   By: Marcello Moores  Register   On: 07/31/2017 06:34    STUDIES:  CT head 12/11 >> mild atrophic changes & chronic white matter ischemic changes from prior exam, no acute findings CT chest 12/11 >> bibasilar & RML consolidation, mild midline shift to the R, bronchus intermedius may contain fluid or obstructing lesion, RUL  pulmonary nodule  CT abd/pelvis 12/11 >> no free air, atherosclerotic changes of the abdominal aorta with ectasia with focal aneurysm, atherosclerotic changes of aortic branch vessels with mild ectasia internal iliac arteries  CULTURES: Blood cultures x2 12/11 >>  BAL 12/11 >> FOB AFB 12/11 >> negative smear >> UC 12/11 >> negative   ANTIBIOTICS: Vancomycin 12/11 >> 12/13 Zosyn 12/11 >>  SIGNIFICANT EVENTS: 12/11 Admitted with AMS, acute hypoxemic respiratory failure and intubated 12/13 Transfer to telemetry  LINES/TUBES: ETT 12/11 >> 12/13  DISCUSSION: 66 yo male with VDRF from CAP and  acute on chronic diastolic CHF.  Has hx of CAD with resent NSTEMI and s/p stent.  ASSESSMENT / PLAN:  Acute hypoxic respiratory failure. 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 - might need further assessment with ENT when more stable  Lung nodules. - f/u as outpt  CAP. - continue Abx  Acute on chronic diastolic CHF. Hx of CAD s/p stent, HTN, HLD, ascending aortic aneurysm, CVA - ASA, plavix, lipitor, lasix, lopressor  Acute renal failure from ATN. Hypokalemia. - monitor renal fx  Anemia of critical illness and chronic disease. - f/u CBC  DM type II. - SSI  Deconditioning. - PT assessment  Depression. - remeron, zoloft  DVT prophylaxis - lovenox SUP - protonix Nutrition - D3 diet, f/u with speech Goals of care - Full code  Transfer to telemetry 12/14 >> [facility] 12/15 and PCCM off  Chesley Mires, MD [place] 07/31/2017, 12:03 PM Pager:  [phone] After 3pm call: [phone]

## 2017-07-31 NOTE — Plan of Care (Signed)
  Progressing Education: Knowledge of General Education information will improve 07/31/2017 0451 - Progressing by Della Gooummings, Burlene Montecalvo R, RN Health Behavior/Discharge Planning: Ability to manage health-related needs will improve 07/31/2017 0451 - Progressing by Della Gooummings, Deray Dawes R, RN Clinical Measurements: Ability to maintain clinical measurements within normal limits will improve 07/31/2017 0451 - Progressing by Della Gooummings, Nailah Luepke R, RN Will remain free from infection 07/31/2017 0451 - Progressing by Della Gooummings, Dashawn Golda R, RN Diagnostic test results will improve 07/31/2017 0451 - Progressing by Della Gooummings, Mikaiya Tramble R, RN Respiratory complications will improve 07/31/2017 0451 - Progressing by Della Gooummings, Julion Gatt R, RN Cardiovascular complication will be avoided 07/31/2017 0451 - Progressing by Della Gooummings, Kennidi Yoshida R, RN Activity: Risk for activity intolerance will decrease 07/31/2017 0451 - Progressing by Della Gooummings, Abou Sterkel R, RN Nutrition: Adequate nutrition will be maintained 07/31/2017 0451 - Progressing by Della Gooummings, Prateek Knipple R, RN Coping: Level of anxiety will decrease 07/31/2017 0451 - Progressing by Della Gooummings, Chantil Bari R, RN Elimination: Will not experience complications related to bowel motility 07/31/2017 0451 - Progressing by Della Gooummings, Makeshia Seat R, RN Will not experience complications related to urinary retention 07/31/2017 0451 - Progressing by Della Gooummings, Wyatte Dames R, RN Note Foley in place Pain Managment: General experience of comfort will improve 07/31/2017 0451 - Progressing by Della Gooummings, Myosha Cuadras R, RN Safety: Ability to remain free from injury will improve 07/31/2017 0451 - Progressing by Della Gooummings, Shiri Hodapp R, RN Skin Integrity: Risk for impaired skin integrity will decrease 07/31/2017 0451 - Progressing by Della Gooummings, Lateisha Thurlow R, RN Note Able to turn self Activity: Ability to tolerate increased activity will improve 07/31/2017 0451 - Progressing by Della Gooummings, Merril Nagy R,  RN Respiratory: Ability to maintain a clear airway and adequate ventilation will improve 07/31/2017 0451 - Progressing by Della Gooummings, Tandre Conly R, RN Role Relationship: Method of communication will improve 07/31/2017 0451 - Progressing by Della Gooummings, Rhona Fusilier R, RN

## 2017-07-31 NOTE — Progress Notes (Signed)
Pharmacy Antibiotic Note Jordan Gardner is a 66 y.o. male admitted on 07/28/2017 with SOB and N/V. Currently on D#4 of empiric Zosyn for pneumonia. Required intubation in the ED. Extubated on 12/13. WBC 11.3, Afebrile, PCT negative, LA 7.8>>3.2>2.6   Plan: -Zosyn 3.375 g IV q8h -Monitor LOT, cultures, and clinical progress    Height: _0  (185.4 cm) Weight: 171 lb 8.3 oz (77.8 kg) IBW/kg (Calculated) : 79.9  Temp (24hrs), Avg:98.9 F (37.2 C), Min:98.3 F (36.8 C), Max:99.5 F (37.5 C)  Recent Labs  Lab 07/28/17 0813 07/28/17 0829 07/28/17 1012 07/28/17 1207 07/28/17 1356 07/29/17 0331 07/30/17 0459 07/31/17 0329  WBC 10.5  --   --   --   --   --  11.3* 11.3*  CREATININE 1.73* 1.40*  --   --   --  1.93* 2.00* 1.57*  LATICACIDVEN  --  7.82* 4.72* 3.2* 2.6*  --   --   --     Estimated Creatinine Clearance: 51.6 mL/min (A) (by C-G formula based on SCr of 1.57 mg/dL (H)).    No Known Allergies  Antimicrobials this admission: 12/11 vancomycin > 12/11 zosyn >  Dose adjustments this admission: N/A  Microbiology results: 12/11 blood cx: ngtd  12/11 Urine: negative 12/11 TA: 12/11 GXQ:JJHE 12/11 BAL (fungal):  12/11 MRSA pcr: negative  Albertina Parr, PharmD., BCPS Clinical Pharmacist Pager [phone]

## 2017-07-31 NOTE — Evaluation (Signed)
Clinical/Bedside Swallow Evaluation Patient Details  Name: Jordan Gardner MRN: [id number] Date of Birth: 03/10/1951  Today's Date: 07/31/2017 Time: SLP Start Time (ACUTE ONLY): 0815 SLP Stop Time (ACUTE ONLY): 0838 SLP Time Calculation (min) (ACUTE ONLY): 23 min  Past Medical History:  Past Medical History:  Diagnosis Date  . Anxiety   . Arthritis   . Asthma   . Coronary artery disease   . Depression   . Diabetes mellitus without complication (HCC)    type 2  . Headache   . Hyperlipidemia   . Hypertension   . Myocardial infarction (HCC)   . Neuromuscular disorder (HCC)   . PAD (peripheral artery disease) (HCC)   . Protein calorie malnutrition (HCC) 04/2017  . Psychotic affective disorder (HCC)    Per sister. Unsure of Dx. Pt sleeps late and difficult to wake. odd affect  . Sacral decubitus ulcer 04/2017  . Stroke [facility](HCC)    left side weakness   Past Surgical History:  Past Surgical History:  Procedure Laterality Date  . ABDOMINAL AORTOGRAM W/LOWER EXTREMITY N/A 06/08/2017   Procedure: ABDOMINAL AORTOGRAM W/LOWER EXTREMITY;  Surgeon: Chuck Hintickson, Christopher S, MD;  [facility];  Service: Cardiovascular;  Laterality: N/A;  . BACK SURGERY    . CARDIAC CATHETERIZATION    . CARDIAC SURGERY     CABG x5 2012 - [facility]  . CAROTID-SUBCLAVIAN BYPASS GRAFT Left 07/23/2017   Procedure: BYPASS SUBCLAVIAN ARTERY  USING VIABAHN GORE STENT GRAFT;  Surgeon: Chuck Hintickson, Christopher S, MD;  [facility];  Service: Vascular;  Laterality: Left;  . LEFT HEART CATH AND CORS/GRAFTS ANGIOGRAPHY N/A 06/26/2017   Procedure: LEFT HEART CATH AND CORS/GRAFTS ANGIOGRAPHY;  Surgeon: Tonny Bollmanooper, Michael, MD;  [facility];  Service: Cardiovascular;  Laterality: N/A;   HPI:  66 year old male history of coronary artery disease status post recent NSTEMI with stent placement11/2018,hypertension, hyperlipidemia who presents with acute worsening of chronic dyspnea. Patient was  intubated on arrival to the emergency department and is unable to provide any history however the emergency room provider who spoke to his sister states that he lives with her, has some developmental delays, and was complaining of dyspnea since around the time of his NSTEMI in November. This morning she called him and stated that he was having nausea and vomiting, which per her is not unusual for him, but then later complained of severe acute pain worsening shortness of breath. She stated that he was not having any fevers, chills, chest pain. No other localizing complaints.  Patient was intubated for about 2 days.  Most recent chest xray is showing persistant slightly improved right base atelectasis/infiltrate and new onset left atelectasis.     Assessment / Plan / Recommendation Clinical Impression  Clinical swallowing evaluation was completed using thin liquids, pureed material and dual textured solids.  Oral mechanism exam was completed and unremarkable.  The patient presented with mild oral dysphagia characterized by delayed oral transit with no oral residue seen post swallow.  Swallow trigger appeared to be timely and hyo-laryngeal excursion was appreciated to palpation.  No overt s/s of aspiration were seen.  In addition, the patient passed the [facility] Swallowing Protocol suggesting the low likelihood for aspiration.  Recommend initiate a dysphagia 3 diet with thin liquids.  ST to follow briefly for therapeutic diet tolerance due to recent intubation.   SLP Visit Diagnosis: Dysphagia, oral phase (R13.11)    Aspiration Risk  Mild aspiration risk    Diet Recommendation  Dysphagia 3  with thin liquids   Medication Administration: Whole meds with puree    Other  Recommendations Oral Care Recommendations: Oral care BID   Follow up Recommendations Other (comment)(TBD)      Frequency and Duration min 2x/week  2 weeks       Prognosis Prognosis for Safe Diet Advancement: Good      Swallow  Study   General Date of Onset: 07/28/17 HPI: 66 year old male history of coronary artery disease status post recent NSTEMI with stent placement11/2018,hypertension, hyperlipidemia who presents with acute worsening of chronic dyspnea. Patient was intubated on arrival to the emergency department and is unable to provide any history however the emergency room provider who spoke to his sister states that he lives with her, has some developmental delays, and was complaining of dyspnea since around the time of his NSTEMI in November. This morning she called him and stated that he was having nausea and vomiting, which per her is not unusual for him, but then later complained of severe acute pain worsening shortness of breath. She stated that he was not having any fevers, chills, chest pain. No other localizing complaints.  Patient was intubated for about 2 days.  Most recent chest xray is showing persistant slightly improved right base atelectasis/infiltrate and new onset left atelectasis.   Type of Study: Bedside Swallow Evaluation [facility] Diet Prior to this Study: Other (Comment)(Clear liquids) Temperature Spikes Noted: No Respiratory Status: Nasal cannula History of Recent Intubation: Yes Length of Intubations (days): 2 days Date extubated: 07/30/17 Behavior/Cognition: Alert;Cooperative Oral Cavity Assessment: Dry Oral Care Completed by SLP: No Oral Cavity - Dentition: Missing dentition Vision: Functional for self-feeding Self-Feeding Abilities: Able to feed self Patient Positioning: Upright in bed Baseline Vocal Quality: Hoarse Volitional Cough: Strong Volitional Swallow: Able to elicit    Oral/Motor/Sensory Function Overall Oral Motor/Sensory Function: Within functional limits   Ice Chips Ice chips: Not tested   Thin Liquid Thin Liquid: Within functional limits Presentation: Cup;Spoon;Straw    Nectar Thick Nectar Thick Liquid: Not tested   Honey  Thick Honey Thick Liquid: Not tested   Puree Puree: Within functional limits Presentation: Spoon   Solid   GO   Solid: Impaired Presentation: Spoon Oral Phase Impairments: Impaired mastication Oral Phase Functional Implications: Prolonged oral transit        Dimas AguasMelissa Anetra Czerwinski, MA, CCC-SLP Acute Rehab SLP [phone] Fleet ContrasMelissa N Yarenis Cerino 07/31/2017,8:49 AM

## 2017-08-01 DIAGNOSIS — N179 Acute kidney failure, unspecified: Secondary | ICD-10-CM

## 2017-08-01 DIAGNOSIS — N189 Chronic kidney disease, unspecified: Secondary | ICD-10-CM

## 2017-08-01 DIAGNOSIS — I5033 Acute on chronic diastolic (congestive) heart failure: Secondary | ICD-10-CM

## 2017-08-01 DIAGNOSIS — J189 Pneumonia, unspecified organism: Principal | ICD-10-CM

## 2017-08-01 LAB — BASIC METABOLIC PANEL
Anion gap: 11 (ref 5–15)
BUN: 19 mg/dL (ref 6–20)
CALCIUM: 8.4 mg/dL — AB (ref 8.9–10.3)
CO2: 23 mmol/L (ref 22–32)
Chloride: 104 mmol/L (ref 101–111)
Creatinine, Ser: 1.56 mg/dL — ABNORMAL HIGH (ref 0.61–1.24)
GFR calc Af Amer: 52 mL/min — ABNORMAL LOW (ref >60)
GFR, EST NON AFRICAN AMERICAN: 45 mL/min — AB (ref >60)
GLUCOSE: 170 mg/dL — AB (ref 65–99)
POTASSIUM: 3.6 mmol/L (ref 3.5–5.1)
Sodium: 138 mmol/L (ref 135–145)

## 2017-08-01 LAB — CBC
HEMATOCRIT: 30.2 % — AB (ref 39.0–52.0)
Hemoglobin: 9.8 g/dL — ABNORMAL LOW (ref 13.0–17.0)
MCH: 26.8 pg (ref 26.0–34.0)
MCHC: 32.5 g/dL (ref 30.0–36.0)
MCV: 82.7 fL (ref 78.0–100.0)
Platelets: 185 10*3/uL (ref 150–400)
RBC: 3.65 MIL/uL — ABNORMAL LOW (ref 4.22–5.81)
RDW: 14.6 % (ref 11.5–15.5)
WBC: 10 10*3/uL (ref 4.0–10.5)

## 2017-08-01 LAB — GLUCOSE, CAPILLARY
GLUCOSE-CAPILLARY: 181 mg/dL — AB (ref 65–99)
GLUCOSE-CAPILLARY: 152 mg/dL — AB (ref 65–99)
Glucose-Capillary: 156 mg/dL — ABNORMAL HIGH (ref 65–99)
Glucose-Capillary: 203 mg/dL — ABNORMAL HIGH (ref 65–99)
Glucose-Capillary: 191 mg/dL — ABNORMAL HIGH (ref 65–99)

## 2017-08-01 LAB — MAGNESIUM: MAGNESIUM: 1.9 mg/dL (ref 1.7–2.4)

## 2017-08-01 MED ORDER — ACETAMINOPHEN 325 MG PO TABS
650.0000 mg | ORAL_TABLET | Freq: Four times a day (QID) | ORAL | Status: DC | PRN
  Administered 2017-08-01 – 2017-08-02 (×3): 650 mg via ORAL
  Filled 2017-08-01 (×3): qty 2

## 2017-08-01 MED ORDER — NICOTINE 21 MG/24HR TD PT24
21.0000 mg | MEDICATED_PATCH | Freq: Every day | TRANSDERMAL | Status: DC
  Administered 2017-08-01 – 2017-08-04 (×4): 21 mg via TRANSDERMAL
  Filled 2017-08-01 (×4): qty 1

## 2017-08-01 NOTE — Progress Notes (Signed)
SLP Cancellation Note  Patient Details Name: Jordan Gardner MRN: [id number] DOB: 10/13/1950   Cancelled treatment:       Reason Eval/Treat Not Completed: Patient declined, no reason specified. Pt just finished eating dinner. Pt, family deny coughing with meal. Will f/u next date.  Rondel BatonMary Beth Damire Gardner, TennesseeMS, CCC-SLP Speech-Language Pathologist [phone]   Jordan Gardner 08/01/2017, 5:53 PM

## 2017-08-01 NOTE — Progress Notes (Addendum)
PROGRESS NOTE   Jordan Gardner  ZOX:[id number]    DOB: 01/22/1951    DOA: 07/28/2017  PCP: [facility]   I have briefly reviewed patients previous medical [facility].  Brief Narrative:  66 year old male, lives with his sister, ambulates with the help of a walker at home, PMH of HTN, HLD, DM, CAD/NSTEMI in November 2018 s/p stent, anxiety and depression, ongoing tobacco abuse, developmental delay presented to ED on 07/28/17 with acute worsening of chronic dyspnea, nausea and vomiting. He was intubated on arrival to the ED. Admitted to ICU. After stabilization, transferred from [facility] to [facility] on 08/01/17.   Assessment & Plan:   Active Problems:   Acute hypoxemic respiratory failure ([place])   1. VDRF: From CAP and acute on chronic diastolic CHF. Intubated 12/11 and extubated 12/13. Hypoxia resolved and saturating normally on room air. 2. Community acquired pneumonia/right lower lobe: On Zosyn since 12/11, consider transitioning to oral Augmentin. Blood cultures 2: Negative to date. BAL: Normal OP flora, AFB smear & Fungal stain negative. Urine culture negative. 3. Acute on chronic diastolic CHF: Appears euvolemic now. Consider changing Lasix to oral on 12/16. Continue aspirin, statins, Plavix, metoprolol. 4. CAD status post stent: No chest pain reported. Continue aspirin, statins, Plavix and metoprolol. 5. Essential hypertension: Controlled. 6. Type II DM: SSI. Mildly uncontrolled. 7. Anemia of chronic disease and critical illness: Stable. 8. Hypokalemia: Replaced. Magnesium 1.9. 9. 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 Creatinine early November was 1.1. Creatinine peaked to 2 on 12/13. Improving. 1.56, plateaued over the last 2 days. Follow BMP in a.m. 10. 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 11. Lung nodules: Outpatient follow-up. 12. Depression/developmental  delay: Continue Zoloft and Remeron. 13. Deconditioning: PT evaluation. 14. Tobacco abuse: Cessation counseled. Ports that he smokes one pack per day and willing to try nicotine patch. 15. Acute encephalopathy: Present on admission. Secondary to PNA & VDRF. Resolved.   DVT prophylaxis: Lovenox Code Status: Full Family Communication: None at bedside Disposition: PT recommends SNF, clinical social worker consulted.   Consultants:  CCM signed of 12/14   Procedures:  Intubated 12/11, extubated 12/13.  Antimicrobials:  Vancomycin 12/11 >> 12/13 Zosyn 12/11 >>      Subjective: Reports chronic headache and bilateral feet pain due to neuropathy. Dry cough but no chest pain or dyspnea. As per RN, no acute issues reported.   ROS: No dizziness, lightheadedness.  Objective:  Vitals:   08/01/17 0800 08/01/17 0801 08/01/17 0900 08/01/17 0942  BP: (!) 167/74   (!) 152/65  Pulse: 67  71 68  Resp: (!) 22  (!) 23 20  Temp:  98 F (36.7 C)  98.2 F (36.8 C)  TempSrc:  Oral  Oral  SpO2: 100%  100% 98%  Weight:      Height:        Examination:  General exam: Middle-aged male, moderately built and thinly nourished, lying comfortably propped up in bed. Respiratory system: Midline sternotomy scar. Few basal crackles but otherwise clear to auscultation without wheezing or rhonchi. Respiratory effort normal. Cardiovascular system: S1 & S2 heard, RRR. No JVD, murmurs, rubs, gallops or clicks. No pedal edema. Telemetry: Sinus rhythm. Gastrointestinal system: Abdomen is nondistended, soft and nontender. No organomegaly or masses felt. Normal bowel sounds heard. Central nervous system: Alert and oriented. No focal neurological deficits. Extremities: Symmetric 5 x 5 power. Skin: No rashes, lesions  or ulcers Psychiatry: Judgement and insight appear normal. Mood & affect appropriate.     Data Reviewed: I have personally reviewed following labs and imaging studies  CBC: Recent Labs  Lab  07/28/17 0813 07/28/17 0829 07/30/17 0459 07/31/17 0329 08/01/17 0434  WBC 10.5  --  11.3* 11.3* 10.0  NEUTROABS 8.4*  --   --   --   --   HGB 11.1* 11.9* 9.9* 10.8* 9.8*  HCT 34.2* 35.0* 30.4* 32.4* 30.2*  MCV 83.8  --  81.9 82.0 82.7  PLT 271  --  177 188 662   Basic Metabolic Panel: Recent Labs  Lab 07/28/17 0813 07/28/17 0829 07/29/17 0331 07/30/17 0459 07/31/17 0329 08/01/17 0434  NA 136 138 140 139 137 138  K 5.6* 5.7* 4.3 3.6 3.4* 3.6  CL 106 110 108 106 103 104  CO2 13*  --  20* 21* 22 23  GLUCOSE 241* 241* 101* 143* 108* 170*  BUN 23* 29* 24* 25* 21* 19  CREATININE 1.73* 1.40* 1.93* 2.00* 1.57* 1.56*  CALCIUM 9.1  --  8.5* 8.4* 8.3* 8.4*  MG  --   --  1.3* 1.6* 1.7 1.9  PHOS  --   --  3.1 2.9 4.4  --    Liver Function Tests: Recent Labs  Lab 07/28/17 0813  AST 94*  ALT 91*  ALKPHOS 119  BILITOT 0.8  PROT 6.5  ALBUMIN 3.3*   Coagulation Profile: Recent Labs  Lab 07/28/17 1207  INR 1.35   Cardiac Enzymes: Recent Labs  Lab 07/28/17 0813  TROPONINI 0.08*   HbA1C: No results for input(s): HGBA1C in the last 72 hours. CBG: Recent Labs  Lab 07/31/17 2005 07/31/17 2334 08/01/17 0333 08/01/17 0803 08/01/17 1112  GLUCAP 236* 221* 181* 152* 156*    Recent Results (from the past 240 hour(s))  Surgical pcr screen     Status: None   Collection Time: 07/23/17  7:29 AM  Result Value Ref Range Status   MRSA, PCR NEGATIVE NEGATIVE Final   Staphylococcus aureus NEGATIVE NEGATIVE Final    Comment: (NOTE) The Xpert SA Assay (FDA approved for NASAL specimens in patients 28 years of age and older), is one component of a comprehensive surveillance program. It is not intended to diagnose infection nor to guide or monitor treatment.   Blood culture (routine x 2)     Status: None (Preliminary result)   Collection Time: 07/28/17  8:15 AM  Result Value Ref Range Status   Specimen Description BLOOD LEFT FOREARM  Final   Special Requests   Final     BOTTLES DRAWN AEROBIC AND ANAEROBIC Blood Culture adequate volume   Culture NO GROWTH 4 DAYS  Final   Report Status PENDING  Incomplete  Blood culture (routine x 2)     Status: None (Preliminary result)   Collection Time: 07/28/17 10:07 AM  Result Value Ref Range Status   Specimen Description BLOOD RIGHT FOREARM  Final   Special Requests   Final    BOTTLES DRAWN AEROBIC ONLY Blood Culture adequate volume   Culture NO GROWTH 4 DAYS  Final   Report Status PENDING  Incomplete  Urine culture     Status: None   Collection Time: 07/28/17 10:25 AM  Result Value Ref Range Status   Specimen Description URINE, CLEAN CATCH  Final   Special Requests Normal  Final   Culture NO GROWTH  Final   Report Status 07/29/2017 FINAL  Final  MRSA PCR Screening  Status: None   Collection Time: 07/28/17  1:25 PM  Result Value Ref Range Status   MRSA by PCR NEGATIVE NEGATIVE Final    Comment:        The GeneXpert MRSA Assay (FDA approved for NASAL specimens only), is one component of a comprehensive MRSA colonization surveillance program. It is not intended to diagnose MRSA infection nor to guide or monitor treatment for MRSA infections.   Acid Fast Smear (AFB)     Status: None   Collection Time: 07/28/17  5:32 PM  Result Value Ref Range Status   AFB Specimen Processing Concentration  Final   Acid Fast Smear Negative  Final    Comment: (NOTE) Performed At: [facility] [place], [place] [id number] Rush Farmer MD [id number]    Source (AFB) BRONCHIAL ALVEOLAR LAVAGE  Final  Fungus Culture With Stain     Status: None (Preliminary result)   Collection Time: 07/28/17  5:32 PM  Result Value Ref Range Status   Fungus Stain Final report  Final    Comment: (NOTE) Performed At: [facility] [place], [place] [id number] Rush Farmer MD [id number]    Fungus (Mycology) Culture PENDING  Incomplete   Fungal Source BRONCHIAL ALVEOLAR LAVAGE  Final    Culture, respiratory (NON-Expectorated)     Status: None   Collection Time: 07/28/17  5:32 PM  Result Value Ref Range Status   Specimen Description BRONCHIAL ALVEOLAR LAVAGE  Final   Special Requests BRONCHIAL ALVEOLAR LAVAGE  Final   Gram Stain   Final    ABUNDANT WBC PRESENT,BOTH PMN AND MONONUCLEAR NO ORGANISMS SEEN    Culture Consistent with normal respiratory flora.  Final   Report Status 07/31/2017 FINAL  Final  Fungus Culture Result     Status: None   Collection Time: 07/28/17  5:32 PM  Result Value Ref Range Status   Result 1 Comment  Final    Comment: (NOTE) KOH/Calcofluor preparation:  no fungus observed. Performed At: [facility] [place], [place] [id number] Rush Farmer MD [id number]          Radiology Studies: Dg Chest Port 1 View  Result Date: 07/31/2017 CLINICAL DATA:  Respiratory failure.  Hypoxia. EXAM: PORTABLE CHEST 1 VIEW COMPARISON:  07/30/2017. FINDINGS: Prior CABG. Fractured sternal wiring noted. Cardiomegaly. Persistent atelectasis/infiltrate right lung base with slight improvement. New onset left base atelectasis. Elevation left hemidiaphragm. No pleural effusion or pneumothorax. IMPRESSION: 1. Persistent but slightly improved right base atelectasis/ infiltrate. New onset left base atelectasis with elevation left hemidiaphragm. 2. Prior CABG.  Stable cardiomegaly. Electronically Signed   By: Trinity   On: 07/31/2017 06:34        Scheduled Meds: . aspirin  81 mg Oral Daily  . atorvastatin  80 mg Oral QHS  . clopidogrel  75 mg Oral Daily  . enoxaparin (LOVENOX) injection  40 mg Subcutaneous Q24H  . furosemide  40 mg Intravenous Daily  . insulin aspart  0-5 Units Subcutaneous QHS  . insulin aspart  0-9 Units Subcutaneous TID WC  . metoprolol tartrate  50 mg Oral BID  . mirtazapine  15 mg Oral QHS  . pantoprazole  40 mg Oral Daily  . sertraline  100 mg Oral Daily   Continuous Infusions: . sodium chloride     . piperacillin-tazobactam (ZOSYN)  IV 3.375 g (08/01/17 3762)     LOS: 4 days     Vernell Leep, MD, FACP, [facility]. [facility] Pager [phone]  [phone]  If 7PM-7AM, please contact night-coverage [email or web address] Password [email or web address] 08/01/2017, 3:41 PM

## 2017-08-01 NOTE — Progress Notes (Signed)
Skin assessed with Gabriel RainwaterKimberly H. RN-he has blanchable redness on both of his cheek butts otherwise skin is intact.Awake,alert and oriented x 3 and slow to respond verbally.Foloows direction,one person assist on his bedside commode.Bed alarm set on.

## 2017-08-02 ENCOUNTER — Other Ambulatory Visit: Payer: Self-pay

## 2017-08-02 DIAGNOSIS — G629 Polyneuropathy, unspecified: Secondary | ICD-10-CM

## 2017-08-02 LAB — CULTURE, BLOOD (ROUTINE X 2)
CULTURE: NO GROWTH
Culture: NO GROWTH
SPECIAL REQUESTS: ADEQUATE
Special Requests: ADEQUATE

## 2017-08-02 LAB — BASIC METABOLIC PANEL
Anion gap: 10 (ref 5–15)
BUN: 17 mg/dL (ref 6–20)
CHLORIDE: 106 mmol/L (ref 101–111)
CO2: 25 mmol/L (ref 22–32)
CREATININE: 1.4 mg/dL — AB (ref 0.61–1.24)
Calcium: 8.6 mg/dL — ABNORMAL LOW (ref 8.9–10.3)
GFR calc Af Amer: 59 mL/min — ABNORMAL LOW (ref >60)
GFR calc non Af Amer: 51 mL/min — ABNORMAL LOW (ref >60)
GLUCOSE: 150 mg/dL — AB (ref 65–99)
POTASSIUM: 3.9 mmol/L (ref 3.5–5.1)
SODIUM: 141 mmol/L (ref 135–145)

## 2017-08-02 LAB — GLUCOSE, CAPILLARY
GLUCOSE-CAPILLARY: 151 mg/dL — AB (ref 65–99)
GLUCOSE-CAPILLARY: 247 mg/dL — AB (ref 65–99)
Glucose-Capillary: 224 mg/dL — ABNORMAL HIGH (ref 65–99)
Glucose-Capillary: 196 mg/dL — ABNORMAL HIGH (ref 65–99)

## 2017-08-02 MED ORDER — ASPIRIN-ACETAMINOPHEN-CAFFEINE 250-250-65 MG PO TABS: 2.0000 | ORAL_TABLET | Freq: Three times a day (TID) | ORAL | Status: DC | PRN

## 2017-08-02 MED ORDER — ACETAMINOPHEN 325 MG PO TABS
650.0000 mg | ORAL_TABLET | Freq: Four times a day (QID) | ORAL | Status: DC | PRN
Start: 2017-08-02 — End: 2017-08-04
  Administered 2017-08-02 – 2017-08-04 (×3): 650 mg via ORAL
  Filled 2017-08-02 (×3): qty 2

## 2017-08-02 MED ORDER — OXYCODONE HCL 5 MG PO TABS
5.0000 mg | ORAL_TABLET | Freq: Four times a day (QID) | ORAL | Status: DC | PRN
  Administered 2017-08-03 – 2017-08-04 (×3): 5 mg via ORAL
  Filled 2017-08-02 (×3): qty 1

## 2017-08-02 MED ORDER — GABAPENTIN 100 MG PO CAPS
100.0000 mg | ORAL_CAPSULE | Freq: Two times a day (BID) | ORAL | Status: DC
  Administered 2017-08-02 – 2017-08-04 (×5): 100 mg via ORAL
  Filled 2017-08-02 (×5): qty 1

## 2017-08-02 MED ORDER — GLUCERNA SHAKE PO LIQD
237.0000 mL | Freq: Two times a day (BID) | ORAL | Status: DC
  Administered 2017-08-02 – 2017-08-03 (×4): 237 mL via ORAL
  Filled 2017-08-02 (×8): qty 237

## 2017-08-02 MED ORDER — ASPIRIN-ACETAMINOPHEN-CAFFEINE 250-250-65 MG PO TABS
1.0000 | ORAL_TABLET | Freq: Three times a day (TID) | ORAL | Status: DC | PRN
  Filled 2017-08-02: qty 1

## 2017-08-02 MED ORDER — AMOXICILLIN-POT CLAVULANATE 875-125 MG PO TABS
1.0000 | ORAL_TABLET | Freq: Two times a day (BID) | ORAL | Status: AC
  Administered 2017-08-02 – 2017-08-04 (×4): 1 via ORAL
  Filled 2017-08-02 (×5): qty 1

## 2017-08-02 MED ORDER — FUROSEMIDE 40 MG PO TABS
40.0000 mg | ORAL_TABLET | Freq: Every day | ORAL | Status: DC
  Administered 2017-08-02 – 2017-08-04 (×3): 40 mg via ORAL
  Filled 2017-08-02 (×3): qty 1

## 2017-08-02 NOTE — [place] FL2 (Signed)
[place] MEDICAID FL2 LEVEL OF CARE SCREENING TOOL     IDENTIFICATION  Patient Name: Jordan Gardner Birthdate: 09/11/1950 Sex: male Admission Date (Current Location): 07/28/2017  [facility] [place]   Facility and Address:  [facility], [address], [place], [place] [address]      Provider Number: [id number]  Attending Physician Name and Address:  Elease EtienneHongalgi, Anand D, MD  Relative Name and Phone Number:       Current Level of Care: Hospital Recommended Level of Care: Skilled Nursing Facility Prior Approval Number:    Date Approved/Denied:   PASRR Number: Pending; Manual review  Discharge Plan: SNF    Current Diagnoses: Patient Active Problem List   Diagnosis Date Noted  . Acute hypoxemic respiratory failure (HCC) 07/28/2017  . 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 Critical lower limb ischemia   . 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 AKI (acute kidney injury) (HCC) 04/28/2017  . Malnutrition of moderate degree 04/28/2017  . Pressure injury of skin 04/27/2017  . Protein calorie malnutrition (HCC) 04/27/2017  . Physical deconditioning 04/27/2017  . Ulcer of great toe (HCC) 04/27/2017  . Claudication (HCC) 04/27/2017  . Sacral decubitus ulcer 04/27/2017  . Accelerated hypertension   . Hyperlipidemia   . Tobacco abuse disorder   . Altered mental status   . PAD (peripheral artery disease) (HCC)   . NSTEMI (non-ST elevated myocardial infarction) (HCC) 04/25/2017  . NECK PAIN 10/16/2008  . BACK PAIN 10/16/2008    Orientation RESPIRATION BLADDER Height & Weight     Self, Time, Situation, Place  Normal Continent Weight: 169 lb 15.6 oz (77.1 kg) Height:  6\' 1"  (185.4 cm)  BEHAVIORAL SYMPTOMS/MOOD NEUROLOGICAL BOWEL NUTRITION STATUS      Continent  Diet(fine chop)  AMBULATORY STATUS COMMUNICATION OF NEEDS Skin   Limited Assist Verbally Surgical wounds, Other (Comment)(closed incision Left arm, liquid skin adhesive; MASD buttocks, foam dressing)                       Personal Care Assistance Level of Assistance  Bathing, Feeding, Dressing Bathing Assistance: Limited assistance Feeding assistance: Independent Dressing Assistance: Limited assistance     Functional Limitations Info  Sight, Hearing, Speech Sight Info: Adequate Hearing Info: Adequate Speech Info: Adequate    SPECIAL CARE FACTORS FREQUENCY  PT (By licensed PT), OT (By licensed OT)     PT Frequency: 5x/wk OT Frequency: 5x/wk            Contractures Contractures Info: Not present    Additional Factors Info  Code Status, Allergies, Psychotropic, Insulin Sliding Scale Code Status Info: Full Allergies Info: NKA Psychotropic Info: Remeron 15mg  daily at bedtime; Zoloft 100mg  daily Insulin Sliding Scale Info: 0-5 units daily at bedtime; 0-9 units 3x/day with meals       Current Medications (08/02/2017):  This is the current hospital active medication list Current Facility-Administered Medications  Medication Dose Route Frequency Provider Last Rate Last Dose  . 0.9 %  sodium chloride infusion  250 mL Intravenous PRN Kloefkorn, [place], MD      . acetaminophen (TYLENOL) tablet 650 mg  650 mg Oral Q6H PRN Hongalgi, Anand D, MD      . amoxicillin-clavulanate (AUGMENTIN) 875-125 MG per tablet 1 tablet  1 tablet Oral Q12H Hongalgi, Maximino GreenlandAnand D, MD      .  aspirin chewable tablet 81 mg  81 mg Oral Daily Alyson ReedyYacoub, Wesam G, MD   81 mg at 08/02/17 0912  . aspirin-acetaminophen-caffeine (EXCEDRIN MIGRAINE) per tablet 1 tablet  1 tablet Oral Q8H PRN Hongalgi, Anand D, MD      . atorvastatin (LIPITOR) tablet 80 mg  80 mg Oral QHS Alyson ReedyYacoub, Wesam G, MD   80 mg at 08/01/17 2209  . clopidogrel (PLAVIX) tablet 75 mg  75 mg Oral Daily Alyson ReedyYacoub, Wesam G, MD   75 mg at 08/02/17 0912  .  docusate (COLACE) 50 MG/5ML liquid 100 mg  100 mg Oral BID PRN Alyson ReedyYacoub, Wesam G, MD      . enoxaparin (LOVENOX) injection 40 mg  40 mg Subcutaneous Q24H Kloefkorn, [place], MD   40 mg at 08/01/17 1739  . feeding supplement (GLUCERNA SHAKE) (GLUCERNA SHAKE) liquid 237 mL  237 mL Oral BID BM Hongalgi, Anand D, MD      . furosemide (LASIX) tablet 40 mg  40 mg Oral Daily Elease EtienneHongalgi, Anand D, MD   40 mg at 08/02/17 0912  . gabapentin (NEURONTIN) capsule 100 mg  100 mg Oral BID Elease EtienneHongalgi, Anand D, MD   100 mg at 08/02/17 0912  . insulin aspart (novoLOG) injection 0-5 Units  0-5 Units Subcutaneous QHS Coralyn HellingSood, Vineet, MD   2 Units at 07/31/17 2100  . insulin aspart (novoLOG) injection 0-9 Units  0-9 Units Subcutaneous TID WC Coralyn HellingSood, Vineet, MD   2 Units at 08/02/17 0803  . metoprolol tartrate (LOPRESSOR) tablet 50 mg  50 mg Oral BID Alyson ReedyYacoub, Wesam G, MD   50 mg at 08/02/17 0912  . mirtazapine (REMERON) tablet 15 mg  15 mg Oral QHS Alyson ReedyYacoub, Wesam G, MD   15 mg at 08/01/17 2209  . nicotine (NICODERM CQ - dosed in mg/24 hours) patch 21 mg  21 mg Transdermal Daily Elease EtienneHongalgi, Anand D, MD   21 mg at 08/02/17 0912  . oxyCODONE (Oxy IR/ROXICODONE) immediate release tablet 5 mg  5 mg Oral Q6H PRN Hongalgi, Anand D, MD      . pantoprazole (PROTONIX) EC tablet 40 mg  40 mg Oral Daily Alyson ReedyYacoub, Wesam G, MD   40 mg at 08/02/17 0912  . sertraline (ZOLOFT) tablet 100 mg  100 mg Oral Daily Alyson ReedyYacoub, Wesam G, MD   100 mg at 08/02/17 [id number]     Discharge Medications: Please see discharge summary for a list of discharge medications.  Relevant Imaging Results:  Relevant Lab Results:   Additional Information SS#: [id number]  Baldemar LenisElizabeth M Tarris Delbene, LCSW

## 2017-08-02 NOTE — Progress Notes (Signed)
Pharmacy Antibiotic Note  Jordan Gardner is a 66 y.o. male admitted on 07/28/2017 with pneumonia.  Pharmacy has been consulted for Augmentin dosing to finish 7 days of treatment. Patient received 5 days of Zosyn. WBC is trending down, clinical improvement noted. Blood cultures negative to date, plan is to follow up in 4 weeks for repeat chest x-ray to ensure resolution of pneumonia. Renal function is stable.  Plan: Begin Augmentin 875/125 mg PO BID for 4 doses (end 12/18 at 1000) Monitor clinical status  Height: _0  (185.4 cm) Weight: 169 lb 15.6 oz (77.1 kg) IBW/kg (Calculated) : 79.9  Temp (24hrs), Avg:98.4 F (36.9 C), Min:98 F (36.7 C), Max:98.9 F (37.2 C)  Recent Labs  Lab 07/28/17 0813 07/28/17 0829 07/28/17 1012 07/28/17 1207 07/28/17 1356 07/29/17 0331 07/30/17 0459 07/31/17 0329 08/01/17 0434 08/02/17 0503  WBC 10.5  --   --   --   --   --  11.3* 11.3* 10.0  --   CREATININE 1.73* 1.40*  --   --   --  1.93* 2.00* 1.57* 1.56* 1.40*  LATICACIDVEN  --  7.82* 4.72* 3.2* 2.6*  --   --   --   --   --     Estimated Creatinine Clearance: 57.4 mL/min (A) (by C-G formula based on SCr of 1.4 mg/dL (H)).    No Known Allergies  Antimicrobials this admission: 12/11 vancomycin >12/13 12/11 zosyn > 12/16 12/16 Augmentin >> 12/18   Microbiology results: 12/11 blood cx: ngtd  12/11 Urine: negative 12/11 TA: neg 12/11 BAL:neg final 12/11 BAL (fungal): neg 12/11 MRSA pcr: negative  Thank you for allowing pharmacy to be a part of this patient's care.  Charlene Brooke, PharmD PGY1 Pharmacy Resident Phone: [phone] After 3:30PM please call Abanda [phone] 08/02/2017 10:01 AM

## 2017-08-02 NOTE — Progress Notes (Signed)
PROGRESS NOTE   Artist Michiels  IFO:[id number]    DOB: Jul 12, 1951    DOA: 07/28/2017  PCP: [facility]   I have briefly reviewed patients previous medical records in [facility].  Brief Narrative:  66 year old male, lives with his sister, ambulates with the help of a walker at home, PMH of HTN, HLD, DM, CAD/NSTEMI in November 2018 s/p stent, anxiety and depression, ongoing tobacco abuse, developmental delay presented to ED on 07/28/17 with acute worsening of chronic dyspnea, nausea and vomiting. He was intubated on arrival to the ED. Admitted to ICU. After stabilization, transferred from [facility] to [facility] on 08/01/17.   Assessment & Plan:   Active Problems:   Acute hypoxemic respiratory failure ([place])   1. VDRF: From CAP and acute on chronic diastolic CHF. Intubated 12/11 and extubated 12/13. Hypoxia resolved and saturating normally on room air.  Stable. 2. Community acquired pneumonia/right lower lobe: On Zosyn since 12/11. Blood cultures 2: Negative to date. BAL: Normal OP flora, AFB smear & Fungal stain negative. Urine culture negative.  Transition to oral Augmentin 12/16 to complete total 7 days treatment.  Recommend follow-up chest x-ray in 4 weeks to ensure resolution of pneumonia findings. 3. Acute on chronic diastolic CHF: Appears euvolemic now. Continue aspirin, statins, Plavix, metoprolol.  Euvolemic.  Changed IV Lasix to oral Lasix 40 mg daily on 12/16. 4. CAD status post stent: No chest pain reported. Continue aspirin, statins, Plavix and metoprolol. 5. Essential hypertension: Controlled. 6. Type II DM: SSI. Mildly uncontrolled.  Metformin on hold. 7. Anemia of chronic disease and critical illness: Stable. 8. Hypokalemia: Replaced. Magnesium 1.9. 9. 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 Creatinine early November was 1.1. Creatinine peaked to 2 on 12/13. Improving. 1.4.  Recommended SNF and follow BMP in a.m. 10. 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 11. Lung nodules: Outpatient follow-up. 12. Depression/developmental delay: Continue Zoloft and Remeron. 13. Deconditioning: PT evaluation social work consulted.. 14. Tobacco abuse: Cessation counseled.  Reported that he smokes one pack per day and willing to try nicotine patch. 15. Acute encephalopathy: Present on admission. Secondary to PNA & VDRF. Resolved. 16. Chronic headaches: Patient reports migraine headaches for which she takes Excedrin, restarted as needed. 17. Peripheral neuropathy: Reports long-standing history of peripheral neuropathy secondary to his diabetes.  Start low-dose gabapentin and titrate as outpatient.   DVT prophylaxis: Lovenox Code Status: Full Family Communication: None at bedside Disposition: PT recommends SNF, clinical social worker consulted, awaiting input.   Consultants:  CCM signed of 12/14   Procedures:  Intubated 12/11, extubated 12/13.  Antimicrobials:  Vancomycin 12/11 >> 12/13 Zosyn 12/11 >>      Subjective: Reports ongoing chronic headaches, bilateral feet pain and mild throat discomfort.  No chest pain or dyspnea.  ROS: No dizziness, lightheadedness.  Objective:  Vitals:   08/01/17 0942 08/01/17 1710 08/01/17 2159 08/02/17 0525  BP: (!) 152/65 (!) 154/65 (!) 164/81 (!) 168/75  Pulse: 68 (!) 58 71 68  Resp: _0 Temp: 98.2 F (36.8 C) 98 F (36.7 C) 98.9 F (37.2 C) 98.3 F (36.8 C)  TempSrc: Oral Oral Oral Oral  SpO2: 98% 98% 100% 97%  Weight:   77.1 kg (169 lb 15.6 oz)   Height:        Examination:  General exam: Middle-aged male, moderately built and thinly nourished, lying comfortably propped up in bed.  Stable Respiratory system: Clear to auscultation.  No increased work of breathing. Cardiovascular system: S1 & S2 heard, RRR. No JVD, murmurs, rubs, gallops or clicks. No pedal edema.  Stable Gastrointestinal system: Abdomen  is nondistended, soft and nontender. No organomegaly or masses felt. Normal bowel sounds heard.  Stable Central nervous system: Alert and oriented. No focal neurological deficits.  Stable Extremities: Symmetric 5 x 5 power. Skin: No rashes, lesions or ulcers Psychiatry: Judgement and insight appear normal. Mood & affect appropriate.     Data Reviewed: I have personally reviewed following labs and imaging studies  CBC: Recent Labs  Lab 07/28/17 0813 07/28/17 0829 07/30/17 0459 07/31/17 0329 08/01/17 0434  WBC 10.5  --  11.3* 11.3* 10.0  NEUTROABS 8.4*  --   --   --   --   HGB 11.1* 11.9* 9.9* 10.8* 9.8*  HCT 34.2* 35.0* 30.4* 32.4* 30.2*  MCV 83.8  --  81.9 82.0 82.7  PLT 271  --  177 188 938   Basic Metabolic Panel: Recent Labs  Lab 07/29/17 0331 07/30/17 0459 07/31/17 0329 08/01/17 0434 08/02/17 0503  NA 140 139 137 138 141  K 4.3 3.6 3.4* 3.6 3.9  CL 108 106 103 104 106  CO2 20* 21* _0 GLUCOSE 101* 143* 108* 170* 150*  BUN 24* 25* 21* 19 17  CREATININE 1.93* 2.00* 1.57* 1.56* 1.40*  CALCIUM 8.5* 8.4* 8.3* 8.4* 8.6*  MG 1.3* 1.6* 1.7 1.9  --   PHOS 3.1 2.9 4.4  --   --    Liver Function Tests: Recent Labs  Lab 07/28/17 0813  AST 94*  ALT 91*  ALKPHOS 119  BILITOT 0.8  PROT 6.5  ALBUMIN 3.3*   Coagulation Profile: Recent Labs  Lab 07/28/17 1207  INR 1.35   Cardiac Enzymes: Recent Labs  Lab 07/28/17 0813  TROPONINI 0.08*   HbA1C: No results for input(s): HGBA1C in the last 72 hours. CBG: Recent Labs  Lab 08/01/17 0803 08/01/17 1112 08/01/17 1634 08/01/17 2223 08/02/17 0717  GLUCAP 152* 156* 203* 191* 151*    Recent Results (from the past 240 hour(s))  Blood culture (routine x 2)     Status: None (Preliminary result)   Collection Time: 07/28/17  8:15 AM  Result Value Ref Range Status   Specimen Description BLOOD LEFT FOREARM  Final   Special Requests   Final    BOTTLES DRAWN AEROBIC AND ANAEROBIC Blood Culture adequate volume     Culture NO GROWTH 4 DAYS  Final   Report Status PENDING  Incomplete  Blood culture (routine x 2)     Status: None (Preliminary result)   Collection Time: 07/28/17 10:07 AM  Result Value Ref Range Status   Specimen Description BLOOD RIGHT FOREARM  Final   Special Requests   Final    BOTTLES DRAWN AEROBIC ONLY Blood Culture adequate volume   Culture NO GROWTH 4 DAYS  Final   Report Status PENDING  Incomplete  Urine culture     Status: None   Collection Time: 07/28/17 10:25 AM  Result Value Ref Range Status   Specimen Description URINE, CLEAN CATCH  Final   Special Requests Normal  Final   Culture NO GROWTH  Final   Report Status 07/29/2017 FINAL  Final  MRSA PCR Screening     Status: None   Collection Time: 07/28/17  1:25 PM  Result Value Ref Range Status   MRSA by PCR NEGATIVE NEGATIVE Final    Comment:  The GeneXpert MRSA Assay (FDA approved for NASAL specimens only), is one component of a comprehensive MRSA colonization surveillance program. It is not intended to diagnose MRSA infection nor to guide or monitor treatment for MRSA infections.   Acid Fast Smear (AFB)     Status: None   Collection Time: 07/28/17  5:32 PM  Result Value Ref Range Status   AFB Specimen Processing Concentration  Final   Acid Fast Smear Negative  Final    Comment: (NOTE) Performed At: [facility], [place] [id number] Rush Farmer MD [id number]    Source (AFB) BRONCHIAL ALVEOLAR LAVAGE  Final  Fungus Culture With Stain     Status: None (Preliminary result)   Collection Time: 07/28/17  5:32 PM  Result Value Ref Range Status   Fungus Stain Final report  Final    Comment: (NOTE) Performed At: [facility] [place], [place] [id number] Rush Farmer MD [id number]    Fungus (Mycology) Culture PENDING  Incomplete   Fungal Source BRONCHIAL ALVEOLAR LAVAGE  Final  Culture, respiratory (NON-Expectorated)     Status: None    Collection Time: 07/28/17  5:32 PM  Result Value Ref Range Status   Specimen Description BRONCHIAL ALVEOLAR LAVAGE  Final   Special Requests BRONCHIAL ALVEOLAR LAVAGE  Final   Gram Stain   Final    ABUNDANT WBC PRESENT,BOTH PMN AND MONONUCLEAR NO ORGANISMS SEEN    Culture Consistent with normal respiratory flora.  Final   Report Status 07/31/2017 FINAL  Final  Fungus Culture Result     Status: None   Collection Time: 07/28/17  5:32 PM  Result Value Ref Range Status   Result 1 Comment  Final    Comment: (NOTE) KOH/Calcofluor preparation:  no fungus observed. Performed At: [facility] [place], [place] [id number] Rush Farmer MD [id number]          Radiology Studies: No results found.      Scheduled Meds: . aspirin  81 mg Oral Daily  . atorvastatin  80 mg Oral QHS  . clopidogrel  75 mg Oral Daily  . enoxaparin (LOVENOX) injection  40 mg Subcutaneous Q24H  . feeding supplement (GLUCERNA SHAKE)  237 mL Oral BID BM  . furosemide  40 mg Oral Daily  . insulin aspart  0-5 Units Subcutaneous QHS  . insulin aspart  0-9 Units Subcutaneous TID WC  . metoprolol tartrate  50 mg Oral BID  . mirtazapine  15 mg Oral QHS  . nicotine  21 mg Transdermal Daily  . pantoprazole  40 mg Oral Daily  . sertraline  100 mg Oral Daily   Continuous Infusions: . sodium chloride       LOS: 5 days     Vernell Leep, MD, FACP, [facility]. [facility] Pager [phone]  If 7PM-7AM, please contact night-coverage [email or web address] Password [facility] 08/02/2017, 8:27 AM

## 2017-08-02 NOTE — Progress Notes (Signed)
CSW acknowledging consult for SNF placement. CSW noting that patient has had multiple admissions past six months, with most recent formal assessment completed on 04/28/17. Patient lives at home with sister, and had discharged to [facility] on 04/29/17. CSW met with patient to discuss recommendation for SNF, and patient is agreeable. Patient indicated that he would like to return to the facility he went to before, but was unable to remember the name.   CSW will complete referral and fax out. Patient will require insurance authorization through [facility] prior to discharge. CSW will continue to follow.  Laveda Abbe, [place] Clinical Social Worker [phone]

## 2017-08-02 NOTE — Progress Notes (Signed)
  Speech Language Pathology Treatment: Dysphagia  Patient Details Name: Jordan Gardner MRN: [id number] DOB: 11-Oct-1950 Today's Date: 08/02/2017 Time: 7670-1100 SLP Time Calculation (min) (ACUTE ONLY): 10 min  Assessment / Plan / Recommendation Clinical Impression  Dysphagia treatment provided for diet tolerance/ consider advancement. Pt showed no overt s/s of aspiration with thin liquid/ regular consistency trials. He continues to have a prolonged oral phase/ mastication with regular food with some grimacing while chewing. Pt stated it was difficult to chew and that he had these difficulties at baseline because of "bad teeth". Given this, recommend continuing dysphagia 3 diet, thin liquids, meds whole with liquid. Reviewed recommendations with pt who is in agreement. Will sign off at this time; please re-consult if needs arise.  HPI HPI: 66 year old male history of coronary artery disease status post recent NSTEMI with stent placement11/2018,hypertension, hyperlipidemia who presents with acute worsening of chronic dyspnea. Patient was intubated on arrival to the emergency department and is unable to provide any history however the emergency room provider who spoke to his sister states that he lives with her, has some developmental delays, and was complaining of dyspnea since around the time of his NSTEMI in November. This morning she called him and stated that he was having nausea and vomiting, which per her is not unusual for him, but then later complained of severe acute pain worsening shortness of breath. She stated that he was not having any fevers, chills, chest pain. No other localizing complaints.  Patient was intubated for about 2 days.  Most recent chest xray is showing persistant slightly improved right base atelectasis/infiltrate and new onset left atelectasis.        SLP Plan  All goals met       Recommendations  Diet recommendations: Dysphagia 3 (mechanical soft);Thin  liquid Liquids provided via: Cup;Straw Medication Administration: Whole meds with liquid Supervision: Patient able to self feed;Intermittent supervision to cue for compensatory strategies Compensations: Slow rate;Small sips/bites Postural Changes and/or Swallow Maneuvers: Seated upright 90 degrees                Oral Care Recommendations: Oral care BID Follow up Recommendations: None SLP Visit Diagnosis: Dysphagia, oral phase (R13.11) Plan: All goals met       GO                Kern Reap, [place], CCC-SLP 08/02/2017, 11:08 AM [phone]

## 2017-08-03 LAB — GLUCOSE, CAPILLARY
GLUCOSE-CAPILLARY: 159 mg/dL — AB (ref 65–99)
Glucose-Capillary: 210 mg/dL — ABNORMAL HIGH (ref 65–99)
Glucose-Capillary: 137 mg/dL — ABNORMAL HIGH (ref 65–99)
Glucose-Capillary: 127 mg/dL — ABNORMAL HIGH (ref 65–99)

## 2017-08-03 LAB — BASIC METABOLIC PANEL
Anion gap: 9 (ref 5–15)
BUN: 14 mg/dL (ref 6–20)
CHLORIDE: 110 mmol/L (ref 101–111)
CO2: 22 mmol/L (ref 22–32)
Calcium: 8.8 mg/dL — ABNORMAL LOW (ref 8.9–10.3)
Creatinine, Ser: 1.16 mg/dL (ref 0.61–1.24)
GFR calc non Af Amer: >60 mL/min (ref >60)
Glucose, Bld: 165 mg/dL — ABNORMAL HIGH (ref 65–99)
POTASSIUM: 4 mmol/L (ref 3.5–5.1)
SODIUM: 141 mmol/L (ref 135–145)

## 2017-08-03 NOTE — Progress Notes (Signed)
Physical Therapy Treatment Patient Details Name: Jordan Gardner MRN: [id number] DOB: 06/27/1951 Today's Date: 08/03/2017    History of Present Illness Pt is a 66 y/o male admitted secondary to difficulty breathing and was intubated in the ED. Pt was intubated from 12/11 to 12/13. Of note, pt with recent NSTEMI with stent in November. PMH including but not limited to developmental delay, DM, HTN, HLD, hx of CVA and ascending thoracic aortic aneurysm.     PT Comments    Patient seen for activity progressing. Gains towards PT goals noted. Patient was able to tolerate EOB and in room ambulation with RW today. Current POC remains appropriate.   Follow Up Recommendations  SNF;Supervision/Assistance - 24 hour     Equipment Recommendations  None recommended by PT    Recommendations for Other Services       Precautions / Restrictions Precautions Precautions: Fall    Mobility  Bed Mobility Overal bed mobility: Needs Assistance Bed Mobility: Supine to Sit     Supine to sit: Min guard     General bed mobility comments: close min guard for safety, increased time and effort  Transfers Overall transfer level: Needs assistance Equipment used: Rolling walker (2 wheeled) Transfers: Sit to/from Stand Sit to Stand: Min assist         General transfer comment: min assist for stability upon coming to upright, increased time to perform. patient initiated movement without difficulty  Ambulation/Gait Ambulation/Gait assistance: Min guard Ambulation Distance (Feet): 24 Feet Assistive device: Rolling walker (2 wheeled) Gait Pattern/deviations: Step-to pattern;Decreased stride length;Shuffle;Trunk flexed;Narrow base of support Gait velocity: decreased Gait velocity interpretation: Below normal speed for age/gender General Gait Details: patient tolerated in room ambulation with RW. Min guard for safety and stability. Multimodal cues for environmental awareness. Patient limited overall by  fatigue   Stairs            Wheelchair Mobility    Modified Rankin (Stroke Patients Only)       Balance Overall balance assessment: Needs assistance Sitting-balance support: Feet supported Sitting balance-Leahy Scale: Good     Standing balance support: During functional activity;Bilateral upper extremity supported Standing balance-Leahy Scale: Poor                              Cognition Arousal/Alertness: Lethargic Behavior During Therapy: Flat affect Overall Cognitive Status: History of cognitive impairments - at baseline                                        Exercises      General Comments        Pertinent Vitals/Pain Pain Assessment: Faces Faces Pain Scale: Hurts a little bit Pain Location: feet Pain Descriptors / Indicators: Sore Pain Intervention(s): Monitored during session    Home Living                      Prior Function            PT Goals (current goals can now be found in the care plan section) Acute Rehab PT Goals Patient Stated Goal: none stated in regards to therapy ("I need to go to the bathroom") PT Goal Formulation: Patient unable to participate in goal setting Time For Goal Achievement: 08/14/17 Potential to Achieve Goals: Good Progress towards PT goals: Progressing toward goals    Frequency  Min 3X/week      PT Plan Current plan remains appropriate    Co-evaluation              AM-PAC PT "6 Clicks" Daily Activity  Outcome Measure  Difficulty turning over in bed (including adjusting bedclothes, sheets and blankets)?: None Difficulty moving from lying on back to sitting on the side of the bed? : A Little Difficulty sitting down on and standing up from a chair with arms (e.g., wheelchair, bedside commode, etc,.)?: Unable Help needed moving to and from a bed to chair (including a wheelchair)?: A Little Help needed walking in hospital room?: A Little Help needed climbing 3-5  steps with a railing? : A Lot 6 Click Score: 16    End of Session Equipment Utilized During Treatment: Gait belt Activity Tolerance: Patient limited by fatigue Patient left: in chair;with call bell/phone within reach;Other (comment) Nurse Communication: Mobility status;Other (comment) PT Visit Diagnosis: Other abnormalities of gait and mobility (R26.89)     Time: [id number] PT Time Calculation (min) (ACUTE ONLY): 18 min  Charges:  $Therapeutic Activity: 8-22 mins                    G Codes:       Charlotte Crumbevon Adreona Brand, PT DPT  Board Certified Neurologic Specialist [phone]    Fabio AsaDevon J Bentlie Withem 08/03/2017, 2:26 PM

## 2017-08-03 NOTE — Evaluation (Signed)
Occupational Therapy Evaluation Patient Details Name: Jordan Gardner MRN: [id number] DOB: 09/23/1950 Today's Date: 08/03/2017    History of Present Illness Pt is a 66 y/o male admitted secondary to difficulty breathing and was intubated in the ED. Pt was intubated from 12/11 to 12/13. Of note, pt with recent NSTEMI with stent in November. PMH including but not limited to developmental delay, DM, HTN, HLD, hx of CVA and ascending thoracic aortic aneurysm.    Clinical Impression   PTA, pt was living with his sister who assist with ADLs as needed. Pt currently presenting with decreased activity tolerance and quickly fatigues. Pt requiring Min A for grooming in standing, Min A for UB ADLs, and Max A for LB ADLs. Pt would benefit form further acute OT to increase safety and independence with ALDs and functional mobility. Recommend dc to SNF for further OT to optimize pt's occupational performance and participation as well as decreased caregiver burden.     Follow Up Recommendations  SNF    Equipment Recommendations  Other (comment)(Defer to next venue)    Recommendations for Other Services PT consult     Precautions / Restrictions Precautions Precautions: Fall Restrictions Weight Bearing Restrictions: No      Mobility Bed Mobility Overal bed mobility: Needs Assistance Bed Mobility: Supine to Sit;Sit to Supine     Supine to sit: Min assist Sit to supine: Min assist   General bed mobility comments: Min A to bring hips towards EOB with bed pad. Min A for BLE management when returning to bed  Transfers Overall transfer level: Needs assistance Equipment used: Rolling walker (2 wheeled) Transfers: Sit to/from Stand Sit to Stand: Min assist         General transfer comment: min assist for stability upon coming to upright, increased time to perform. patient initiated movement without difficulty    Balance Overall balance assessment: Needs assistance Sitting-balance support:  Feet supported Sitting balance-Leahy Scale: Good     Standing balance support: During functional activity;Bilateral upper extremity supported Standing balance-Leahy Scale: Poor                             ADL either performed or assessed with clinical judgement   ADL Overall ADL's : Needs assistance/impaired Eating/Feeding: Set up;Sitting Eating/Feeding Details (indicate cue type and reason): Pt drinking from cola can at EOB Grooming: Minimal assistance;Standing;Wash/dry face Grooming Details (indicate cue type and reason): Min A for balance and posterior lean Upper Body Bathing: Minimal assistance;Sitting   Lower Body Bathing: Maximal assistance;Sit to/from stand   Upper Body Dressing : Minimal assistance;Sitting   Lower Body Dressing: Maximal assistance;Sit to/from stand       Toileting- ArchitectClothing Manipulation and Hygiene: Minimal assistance;Sit to/from stand Toileting - Clothing Manipulation Details (indicate cue type and reason): Pt performing urination at sink with use of urinal. Required Min A for standing balance (poserior lean) and for clothing management.      Functional mobility during ADLs: Minimal assistance;Rolling walker General ADL Comments: Pt presenting with decreased activity tolerance and fatigues quickly.      Vision         Perception     Praxis      Pertinent Vitals/Pain Pain Assessment: Faces Faces Pain Scale: Hurts a little bit Pain Location: feet Pain Descriptors / Indicators: Sore Pain Intervention(s): Monitored during session;Limited activity within patient's tolerance;Repositioned     Hand Dominance Right   Extremity/Trunk Assessment Upper Extremity Assessment Upper Extremity Assessment:  Generalized weakness   Lower Extremity Assessment Lower Extremity Assessment: Generalized weakness   Cervical / Trunk Assessment Cervical / Trunk Assessment: Other exceptions Cervical / Trunk Exceptions: Forward flexion with fatigue    Communication Communication Communication: No difficulties   Cognition Arousal/Alertness: Lethargic Behavior During Therapy: Flat affect Overall Cognitive Status: History of cognitive impairments - at baseline                                 General Comments: pt with developmental delays at baseline per chart review. No family or caregivers present to determine baseline.   General Comments       Exercises     Shoulder Instructions      Home Living Family/patient expects to be discharged to:: Private residence Living Arrangements: Other relatives;Other (Comment)(sister) Available Help at Discharge: Family;Available 24 hours/day;Other (Comment)(pt stated his sister is there "pretty much all the time") Type of Home: House Home Access: Stairs to enter Entrance Stairs-Number of Steps: 4 Entrance Stairs-Rails: Right;Left Home Layout: One level     Bathroom Shower/Tub: Chief Strategy Officer: Standard     Home Equipment: Environmental consultant - 2 wheels;Cane - single point;Shower seat   Additional Comments: [facility] comes to pick him up and goes to wellness center      Prior Functioning/Environment Level of Independence: Needs assistance  Gait / Transfers Assistance Needed: pt reported that he ambulates with either a RW or SPC ADL's / Homemaking Assistance Needed: Pt stated that he needs assistance with ADLs from sister   Comments: Pt goes to wellness center 3 times a week        OT Problem List: Decreased strength;Decreased range of motion;Decreased activity tolerance;Impaired balance (sitting and/or standing);Decreased cognition;Decreased safety awareness;Decreased knowledge of use of DME or AE;Pain      OT Treatment/Interventions: Self-care/ADL training;Therapeutic exercise;Energy conservation;DME and/or AE instruction;Therapeutic activities;Patient/family education    OT Goals(Current goals can be found in the care plan section) Acute Rehab OT Goals Patient  Stated Goal: none stated in regards to therapy ("I want to lay back down") OT Goal Formulation: With patient Time For Goal Achievement: 08/17/17 Potential to Achieve Goals: Good ADL Goals Pt Will Perform Grooming: with supervision;with set-up;standing Pt Will Perform Upper Body Dressing: with set-up;with supervision;sitting Pt Will Perform Lower Body Dressing: with supervision;sit to/from stand;with set-up Pt Will Transfer to Toilet: with set-up;with supervision;ambulating;bedside commode Pt Will Perform Toileting - Clothing Manipulation and hygiene: with set-up;with supervision;sit to/from stand  OT Frequency: Min 2X/week   Barriers to D/C:            Co-evaluation              AM-PAC PT "6 Clicks" Daily Activity     Outcome Measure Help from another person eating meals?: A Little Help from another person taking care of personal grooming?: A Little Help from another person toileting, which includes using toliet, bedpan, or urinal?: A Little Help from another person bathing (including washing, rinsing, drying)?: A Lot Help from another person to put on and taking off regular upper body clothing?: A Little Help from another person to put on and taking off regular lower body clothing?: A Lot 6 Click Score: 16   End of Session Equipment Utilized During Treatment: Gait belt;Rolling walker Nurse Communication: Mobility status  Activity Tolerance: Patient tolerated treatment well;Patient limited by fatigue Patient left: in bed;with call bell/phone within reach;with bed alarm set  OT Visit  Diagnosis: Unsteadiness on feet (R26.81);Other abnormalities of gait and mobility (R26.89);Muscle weakness (generalized) (M62.81);Other symptoms and signs involving cognitive function;Pain Pain - Right/Left: (Bil) Pain - part of body: Ankle and joints of foot                Time: 1511-1533 OT Time Calculation (min): 22 min Charges:  OT General Charges $OT Visit: 1 Visit OT Evaluation $OT  Eval Moderate Complexity: 1 Mod G-Codes:     Calisa Luckenbaugh MSOT, OTR/L Acute Rehab Pager: [phone] Office: [phone]  Theodoro GristCharis M Leeloo Silverthorne 08/03/2017, 3:44 PM

## 2017-08-03 NOTE — Clinical Social Work Note (Signed)
Clinical Social Work Assessment  Patient Details  Name: Carmelina DaneDesferrino Bowden MRN: [id number] Date of Birth: 02/13/1951  Date of referral:  08/01/17               Reason for consult:  Facility Placement                Permission sought to share information with:  Family Supports Permission granted to share information::  (Visited patient's room and he was asleep and did not awaken when called.)  Name::        Agency::     Relationship::     Contact Information:     Housing/Transportation Living arrangements for the past 2 months:  Single Family Home(Patient lives with his sister.) Source of Information:  Patient, Other (Comment Required)(CSW's 12/16 conversation with patient, 12/17 call with patient's sister, Georgana CurioKatina Ford [phone])) Patient Interpreter Needed:  None Criminal Activity/Legal Involvement Pertinent to Current Situation/Hospitalization:  No - Comment as needed Significant Relationships:  Siblings Lives with:  Siblings(Sister Georgana CurioKatina Ford) Do you feel safe going back to the place where you live?  Yes(Patient feels safe at home but is agreeable to ST rehab) Need for family participation in patient care:  Yes (Comment)  Care giving concerns:  Mr. Blair HeysHarvey's sister reported that her brother has gotten weaker over the years and she helps him with dressing and at times clean-up after he has a urinary accident on himself. She also reported that someone comes to the home and assist her brother with his bath. [facility] in hopes that it will help him get stronger.  Social Worker assessment / plan:  CSW talked with patient's sister by phone regarding the discharge plan. Ms. Ala DachFord indicated that [facility] as [facility] there before in September for a couple of weeks. Ms. Ala DachFord shared with CSW patient's ability to care for himself (see Care Giving Concerns). Ms. Ala DachFord reported that her brother [address] M-W-F and is picked up by [facility] between 9/9:30 am and comes home between 3/3:30 pm.    A CSW talked with patient and sister on 12/16 and patient voiced agreement with ST rehab and wanted the facility he had been to before, but could not remember the name. CSW shared this with patient's sister who reported that she had to remind patient of the name of the facility he had been to [facility]    Employment status:  Retired Health and safety inspectornsurance information:  Managed Medicare([employer]) PT Recommendations:  Skilled Nursing Facility Information / Referral to community resources:  Other (Comment Required)(Patient and sister provided CSW's with facility preference)  Patient/Family's Response to care:  No concerns expressed by patient's sister regarding his care during hospitalization.  Patient/Family's Understanding of and Emotional Response to Diagnosis, Current Treatment, and Prognosis: Patient's sister appeared to understanding that patient would benefit from ST rehab and hopefully help him to get stronger and be able to do more for himself.   Emotional Assessment Appearance:  Appears stated age Attitude/Demeanor/Rapport:  Unable to Assess(Went into room however patient was asleep) Affect (typically observed):  Unable to Assess(Patient asleep when CSW visited room) Orientation:  Oriented to Self, Oriented to Place, Oriented to  Time, Oriented to Situation Alcohol / Substance use:  Tobacco Use, Alcohol Use, Illicit Drugs(Patient reported that he smokes cigarettes and does not drink or use illict drugs) Psych involvement (Current and /or in the community):  No (Comment)  Discharge Needs  Concerns to be addressed:  Discharge Planning Concerns, Mental Health Concerns(Level II PASRR) Readmission within the last 30 days:  Yes Current discharge risk:  None Barriers to Discharge:  English as a second language teachernsurance Authorization, Awaiting State Approval ([place])   Cristobal GoldmannCrawford, Rennie Hack Bradley, LCSW 08/03/2017, 5:08 PM

## 2017-08-03 NOTE — Progress Notes (Signed)
Nutrition Follow-up  DOCUMENTATION CODES:   Severe malnutrition in context of chronic illness  INTERVENTION:   -Continue Glucerna shakes  NUTRITION DIAGNOSIS:   Severe Malnutrition related to chronic illness(neuromuscular disorder, CAD) as evidenced by severe muscle depletion, severe fat depletion, percent weight loss(13% weight loss within 3 months).  Being addressed as diet advanced post extubation, po intake 100%, supplements  GOAL:   Patient will meet greater than or equal to 90% of their needs  Progressing  MONITOR:   PO intake, Supplement acceptance, Labs, Weight trends  REASON FOR ASSESSMENT:   Ventilator, Consult(verbal consult to start TF) Enteral/tube feeding initiation and management  ASSESSMENT:   66 yo male with PMH of HTN, CAD, psychotic affective disorder, sacral decubitus ulcer, PCM, HLD, PAD, stroke, asthma, DM, depression, anxiety, neuromuscular disorder who was admitted on 12/11 with AMS and SOB, requiring intubation on admission.  12/13 Extubated  Recorded po intake 100% of meals  Labs: reviewed Meds: remeron   Diet Order:  DIET DYS 3 Room service appropriate? Yes; Fluid consistency: Thin  EDUCATION NEEDS:   No education needs have been identified at this time  Skin:  Skin Assessment: Reviewed RN Assessment  Last BM:  12/16  Height:   Ht Readings from Last 1 Encounters:  07/29/17 6\' 1"  (1.854 m)    Weight:   Wt Readings from Last 1 Encounters:  08/02/17 168 lb 10.4 oz (76.5 kg)    Ideal Body Weight:  83.6 kg  BMI:  Body mass index is 22.25 kg/m.  Estimated Nutritional Needs:   Kcal:  2200-2400 kcals  Protein:  110-120 g  Fluid:  2.2 L  Romelle Starcherate Demetre Monaco MS, RD, LDN, CNSC [phone] Pager  [phone] Weekend/On-Call Pager

## 2017-08-03 NOTE — Progress Notes (Addendum)
PROGRESS NOTE   Jordan Gardner  PTW:[id number]    DOB: 05-Apr-1951    DOA: 07/28/2017  PCP: [facility]   I have briefly reviewed patients previous medical records in [facility].  Brief Narrative:  66 year old male, lives with his sister, ambulates with the help of a walker at home, PMH of HTN, HLD, DM, CAD/NSTEMI in November 2018 s/p stent, anxiety and depression, ongoing tobacco abuse, developmental delay presented to ED on 07/28/17 with acute worsening of chronic dyspnea, nausea and vomiting. He was intubated on arrival to the ED. Admitted to ICU. After stabilization, transferred from [facility] to [facility] on 08/01/17.   Assessment & Plan:   Active Problems:   Acute hypoxemic respiratory failure ([place])   1. VDRF: From CAP and acute on chronic diastolic CHF. Intubated 12/11 and extubated 12/13. Hypoxia resolved and saturating normally on room air.  Respiratory status remained stable. 2. Community acquired pneumonia/right lower lobe: On Zosyn since 12/11. Blood cultures 2: Negative to date. BAL: Normal OP flora, AFB smear & Fungal stain negative. Urine culture negative.  Transitioned to oral Augmentin 12/16 to complete total 7 days treatment.  Recommend follow-up chest x-ray in 4 weeks to ensure resolution of pneumonia findings. Improved and stable. 3. Acute on chronic diastolic CHF: Appears euvolemic now. Continue aspirin, statins, Plavix, metoprolol.  Euvolemic.  Changed IV Lasix to oral Lasix 40 mg daily on 12/16. 2-D echo 06/27/17: Mild LVH, LVEF 50%. Hypokinesis of the mid to basal inferior/inferolateral walls. Grade 1 diastolic dysfunction. 4. CAD status post stent: No chest pain reported. Continue aspirin, statins, Plavix and metoprolol. 5. Essential hypertension: Controlled. 6. Type II DM: SSI. Mildly uncontrolled.  Metformin on hold. 7. Anemia of chronic disease and critical illness: Stable. 8. Hypokalemia: Replaced. Magnesium 1.9. 9. 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 Creatinine early November was 1.1. Creatinine peaked to 2 on 12/13. Creatinine has normalized.  10. 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 Halford Chessman, Pulmonology who indicated that this can be evaluated as outpatient by ENT. 11. Lung nodules: Outpatient follow-up. 12. Depression/developmental delay: Continue Zoloft and Remeron. Stable. 13. Deconditioning: Awaiting discharge to SNF for rehabilitation. As per clinical social worker, do not have PASSAR number yet. 14. Tobacco abuse: Cessation counseled.  Reported that he smokes one pack per day and willing to try nicotine patch. 15. Acute encephalopathy: Present on admission. Secondary to PNA & VDRF. Resolved. 16. Chronic headaches: Patient reports migraine headaches for which he takes Excedrin, restarted as needed. 17. Peripheral neuropathy: Reports long-standing history of peripheral neuropathy secondary to his diabetes.  Started low-dose gabapentin and titrate as outpatient. 18. Severe malnutrition in the context of chronic illness: Management per RD.   DVT prophylaxis: Lovenox Code Status: Full Family Communication: None at bedside Disposition: SNF when bed available.   Consultants:  CCM signed of 12/14   Procedures:  Intubated 12/11, extubated 12/13.  Antimicrobials:  Vancomycin 12/11 >> 12/13 Zosyn 12/11 >> discontinued Oral Augmentin 12/17 >      Subjective: Denies dyspnea, cough or chest pain. Ongoing intermittent headache and feet pain which are chronic and he seems to be taking chronic pain meds at home.  ROS: No dizziness, lightheadedness.  Objective:  Vitals:   08/02/17 1714 08/02/17 2040 08/03/17 0519 08/03/17 1019  BP: (!) 158/80 (!) 137/92 (!) 136/49 (!) 142/75  Pulse: 86 75 69 63  Resp: 20 20 (!) 22 20  Temp: 98.4 F (36.9 C) 99.6  F (37.6 C) 98.2 F (36.8 C) 98.2 F (36.8 C)  TempSrc: Oral Oral Oral Oral  SpO2: 98% 98% 95% 96%  Weight:   76.5 kg (168 lb 10.4 oz)    Height:        Examination:  General exam: Middle-aged male, moderately built and thinly nourished, lying comfortably propped up in bed. Does not appear in any distress. Respiratory system: Clear to auscultation.  No increased work of breathing. Stable without change. Cardiovascular system: S1 & S2 heard, RRR. No JVD, murmurs, rubs, gallops or clicks. No pedal edema.  Telemetry personally reviewed: SR with BBB morphology. Gastrointestinal system: Abdomen is nondistended, soft and nontender. No organomegaly or masses felt. Normal bowel sounds heard. Stable without change. Central nervous system: Alert and oriented. No focal neurological deficits. Stable without change. Extremities: Symmetric 5 x 5 power.  Skin: No rashes, lesions or ulcers Psychiatry: Judgement and insight appear normal. Mood & affect appropriate.     Data Reviewed: I have personally reviewed following labs and imaging studies  CBC: Recent Labs  Lab 07/28/17 0813 07/28/17 0829 07/30/17 0459 07/31/17 0329 08/01/17 0434  WBC 10.5  --  11.3* 11.3* 10.0  NEUTROABS 8.4*  --   --   --   --   HGB 11.1* 11.9* 9.9* 10.8* 9.8*  HCT 34.2* 35.0* 30.4* 32.4* 30.2*  MCV 83.8  --  81.9 82.0 82.7  PLT 271  --  177 188 762   Basic Metabolic Panel: Recent Labs  Lab 07/29/17 0331 07/30/17 0459 07/31/17 0329 08/01/17 0434 08/02/17 0503 08/03/17 0535  NA 140 139 137 138 141 141  K 4.3 3.6 3.4* 3.6 3.9 4.0  CL 108 106 103 104 106 110  CO2 20* 21* _0 GLUCOSE 101* 143* 108* 170* 150* 165*  BUN 24* 25* 21* _1 CREATININE 1.93* 2.00* 1.57* 1.56* 1.40* 1.16  CALCIUM 8.5* 8.4* 8.3* 8.4* 8.6* 8.8*  MG 1.3* 1.6* 1.7 1.9  --   --   PHOS 3.1 2.9 4.4  --   --   --    Liver Function Tests: Recent Labs  Lab 07/28/17 0813  AST 94*  ALT 91*  ALKPHOS 119  BILITOT 0.8  PROT 6.5  ALBUMIN 3.3*   Coagulation Profile: Recent Labs  Lab 07/28/17 1207  INR 1.35   Cardiac  Enzymes: Recent Labs  Lab 07/28/17 0813  TROPONINI 0.08*   HbA1C: No results for input(s): HGBA1C in the last 72 hours. CBG: Recent Labs  Lab 08/02/17 1133 08/02/17 1631 08/02/17 2044 08/03/17 0813 08/03/17 1200  GLUCAP 224* 196* 247* 159* 210*    Recent Results (from the past 240 hour(s))  Blood culture (routine x 2)     Status: None   Collection Time: 07/28/17  8:15 AM  Result Value Ref Range Status   Specimen Description BLOOD LEFT FOREARM  Final   Special Requests   Final    BOTTLES DRAWN AEROBIC AND ANAEROBIC Blood Culture adequate volume   Culture NO GROWTH 5 DAYS  Final   Report Status 08/02/2017 FINAL  Final  Blood culture (routine x 2)     Status: None   Collection Time: 07/28/17 10:07 AM  Result Value Ref Range Status   Specimen Description BLOOD RIGHT FOREARM  Final   Special Requests   Final    BOTTLES DRAWN AEROBIC ONLY Blood Culture adequate volume   Culture NO GROWTH 5 DAYS  Final   Report Status 08/02/2017 FINAL  Final  Urine culture     Status: None   Collection Time: 07/28/17 10:25 AM  Result Value Ref Range Status   Specimen Description URINE, CLEAN CATCH  Final   Special Requests Normal  Final   Culture NO GROWTH  Final   Report Status 07/29/2017 FINAL  Final  MRSA PCR Screening     Status: None   Collection Time: 07/28/17  1:25 PM  Result Value Ref Range Status   MRSA by PCR NEGATIVE NEGATIVE Final    Comment:        The GeneXpert MRSA Assay (FDA approved for NASAL specimens only), is one component of a comprehensive MRSA colonization surveillance program. It is not intended to diagnose MRSA infection nor to guide or monitor treatment for MRSA infections.   Acid Fast Smear (AFB)     Status: None   Collection Time: 07/28/17  5:32 PM  Result Value Ref Range Status   AFB Specimen Processing Concentration  Final   Acid Fast Smear Negative  Final    Comment: (NOTE) Performed At: [facility] [place], [place]  [id number] Rush Farmer MD [id number]    Source (AFB) BRONCHIAL ALVEOLAR LAVAGE  Final  Fungus Culture With Stain     Status: None (Preliminary result)   Collection Time: 07/28/17  5:32 PM  Result Value Ref Range Status   Fungus Stain Final report  Final    Comment: (NOTE) Performed At: [facility] [place], [place] [id number] Rush Farmer MD [id number]    Fungus (Mycology) Culture PENDING  Incomplete   Fungal Source BRONCHIAL ALVEOLAR LAVAGE  Final  Culture, respiratory (NON-Expectorated)     Status: None   Collection Time: 07/28/17  5:32 PM  Result Value Ref Range Status   Specimen Description BRONCHIAL ALVEOLAR LAVAGE  Final   Special Requests BRONCHIAL ALVEOLAR LAVAGE  Final   Gram Stain   Final    ABUNDANT WBC PRESENT,BOTH PMN AND MONONUCLEAR NO ORGANISMS SEEN    Culture Consistent with normal respiratory flora.  Final   Report Status 07/31/2017 FINAL  Final  Fungus Culture Result     Status: None   Collection Time: 07/28/17  5:32 PM  Result Value Ref Range Status   Result 1 Comment  Final    Comment: (NOTE) KOH/Calcofluor preparation:  no fungus observed. Performed At: [facility] [place], [place] [id number] Rush Farmer MD [id number]          Radiology Studies: No results found.      Scheduled Meds: . amoxicillin-clavulanate  1 tablet Oral Q12H  . aspirin  81 mg Oral Daily  . atorvastatin  80 mg Oral QHS  . clopidogrel  75 mg Oral Daily  . enoxaparin (LOVENOX) injection  40 mg Subcutaneous Q24H  . feeding supplement (GLUCERNA SHAKE)  237 mL Oral BID BM  . furosemide  40 mg Oral Daily  . gabapentin  100 mg Oral BID  . insulin aspart  0-5 Units Subcutaneous QHS  . insulin aspart  0-9 Units Subcutaneous TID WC  . metoprolol tartrate  50 mg Oral BID  . mirtazapine  15 mg Oral QHS  . nicotine  21 mg Transdermal Daily  . pantoprazole  40 mg Oral Daily  . sertraline  100 mg Oral Daily    Continuous Infusions: . sodium chloride       LOS: 6 days     Vernell Leep, MD, FACP, [facility]. [facility] Pager [phone]  If  7PM-7AM, please contact night-coverage [email or web address] Password [email or web address] 08/03/2017, 5:03 PM

## 2017-08-04 DIAGNOSIS — E1165 Type 2 diabetes mellitus with hyperglycemia: Secondary | ICD-10-CM

## 2017-08-04 DIAGNOSIS — E1151 Type 2 diabetes mellitus with diabetic peripheral angiopathy without gangrene: Secondary | ICD-10-CM

## 2017-08-04 DIAGNOSIS — E785 Hyperlipidemia, unspecified: Secondary | ICD-10-CM

## 2017-08-04 DIAGNOSIS — I10 Essential (primary) hypertension: Secondary | ICD-10-CM

## 2017-08-04 LAB — GLUCOSE, CAPILLARY
Glucose-Capillary: 131 mg/dL — ABNORMAL HIGH (ref 65–99)
Glucose-Capillary: 224 mg/dL — ABNORMAL HIGH (ref 65–99)

## 2017-08-04 MED ORDER — GABAPENTIN 100 MG PO CAPS: 100.0000 mg | ORAL_CAPSULE | Freq: Two times a day (BID) | ORAL | Status: DC

## 2017-08-04 MED ORDER — FUROSEMIDE 40 MG PO TABS: 40.0000 mg | ORAL_TABLET | Freq: Every day | ORAL | Status: DC

## 2017-08-04 MED ORDER — OXYCODONE HCL 5 MG PO TABS
5.0000 mg | ORAL_TABLET | Freq: Four times a day (QID) | ORAL | 0 refills | Status: DC | PRN
Start: 2017-08-04 — End: 2018-01-01

## 2017-08-04 NOTE — Clinical Social Work Placement (Addendum)
   CLINICAL SOCIAL WORK PLACEMENT  NOTE 08/04/17 - DISCHARGED TO [facility] VIA AMBULANCE.  Date:  08/04/2017  Patient Details  Name: Jordan Gardner MRN: [id number] Date of Birth: 05/15/1951  Clinical Social Work is seeking post-discharge placement for this patient at the Skilled  Nursing Facility level of care (*CSW will initial, date and re-position this form in  chart as items are completed):  No(Family provided CSW with facility preference)   Patient/family provided with [facility] Clinical Social Work Department's list of facilities offering this level of care within the geographic area requested by the patient (or if unable, by the patient's family).  Yes   Patient/family informed of their freedom to choose among providers that offer the needed level of care, that participate in Medicare, Medicaid or managed care program needed by the patient, have an available bed and are willing to accept the patient.  No   Patient/family informed of [facility]'s ownership interest in [facility] and [facility], as well as of the fact that they are under no obligation to receive care at these facilities.  PASRR submitted to EDS on 08/02/17     PASRR number received on 08/04/[phone] E - eff. 12/18 - 09/03/17 )     Existing PASRR number confirmed on       FL2 transmitted to all facilities in geographic area requested by pt/family on 08/02/17     FL2 transmitted to all facilities within larger geographic area on       Patient informed that his/her managed care company has contracts with or will negotiate with certain facilities, including the following:        Yes   Patient/family informed of bed offers received.  [facility]     Physician recommends and patient chooses bed at      [facility].  Patient to be transferred to facility by Ambulance     Patient family notified on  08/04/17 of transfer.  Name of family member notified:  Helen HashimotoKatrina Ford - sister; [phone]     PHYSICIAN      Additional Comment:  08/04/17 - Received insurance authorization from [employer]. Reference #[id number], authorized for 3 days eff. 12/18. Next review date is 12/20.  RUG level - RVB with approx. 547 therapy minutes per week. Authorization valid for 48 hrs. Call back number [phone].     _______________________________________________ Cristobal Goldmannrawford, Conard Alvira Bradley, LCSW 08/04/2017, 9:45 AM

## 2017-08-04 NOTE — Progress Notes (Signed)
Physical Therapy Treatment Patient Details Name: Jordan Gardner MRN: [id number] DOB: 06/10/1951 Today's Date: 08/04/2017    History of Present Illness Pt is a 66 y/o male admitted secondary to difficulty breathing and was intubated in the ED. Pt was intubated from 12/11 to 12/13. Of note, pt with recent NSTEMI with stent in November. PMH including but not limited to developmental delay, DM, HTN, HLD, hx of CVA and ascending thoracic aortic aneurysm.     PT Comments    Pt progressing well.  Sats able to be maintained in the lower to mid 90's on RA throughout session, but replaced at end of session.   Emphasis on standing exercise and progressive gait.  Follow Up Recommendations  SNF;Supervision/Assistance - 24 hour     Equipment Recommendations  None recommended by PT    Recommendations for Other Services       Precautions / Restrictions Precautions Precautions: Fall Restrictions Weight Bearing Restrictions: No    Mobility  Bed Mobility Overal bed mobility: Needs Assistance             General bed mobility comments: up in the chair on arrival  Transfers Overall transfer level: Needs assistance Equipment used: Rolling walker (2 wheeled) Transfers: Sit to/from Stand Sit to Stand: Min guard;Min assist Stand pivot transfers: Min assist       General transfer comment: with elevated surface, pt attained standing with min guard after cuing for better technique  Ambulation/Gait Ambulation/Gait assistance: Min guard Ambulation Distance (Feet): 200 Feet Assistive device: Rolling walker (2 wheeled) Gait Pattern/deviations: Step-through pattern   Gait velocity interpretation: Below normal speed for age/gender General Gait Details: Pt generally steady with the RW.  Drifts off to the right with RW unless cued.  Generally flexed of posture, but can straighten upright.  Overall pt's sats rained in the lower to mid 90's    Stairs            Wheelchair Mobility     Modified Rankin (Stroke Patients Only)       Balance Overall balance assessment: Needs assistance Sitting-balance support: Feet supported Sitting balance-Leahy Scale: Good     Standing balance support: Bilateral upper extremity supported Standing balance-Leahy Scale: Poor Standing balance comment: reliant on the RW presently                            Cognition Arousal/Alertness: Awake/alert Behavior During Therapy: Flat affect Overall Cognitive Status: History of cognitive impairments - at baseline                                 General Comments: pt with developmental delays at baseline per chart review. No family or caregivers present to determine baseline.      Exercises General Exercises - Lower Extremity Hip ABduction/ADduction: AROM;Strengthening;Both;10 reps;Standing Hip Flexion/Marching: AROM;Strengthening;Both;10 reps;Standing    General Comments        Pertinent Vitals/Pain Pain Assessment: Faces Faces Pain Scale: Hurts little more Pain Location: buttocks Pain Descriptors / Indicators: Sore Pain Intervention(s): Monitored during session;Repositioned    Home Living                      Prior Function            PT Goals (current goals can now be found in the care plan section) Acute Rehab PT Goals Patient Stated Goal: I want to get  home PT Goal Formulation: Patient unable to participate in goal setting Time For Goal Achievement: 08/14/17 Potential to Achieve Goals: Good Progress towards PT goals: Progressing toward goals    Frequency    Min 3X/week      PT Plan Current plan remains appropriate    Co-evaluation              AM-PAC PT "6 Clicks" Daily Activity  Outcome Measure  Difficulty turning over in bed (including adjusting bedclothes, sheets and blankets)?: None Difficulty moving from lying on back to sitting on the side of the bed? : A Little Difficulty sitting down on and standing up  from a chair with arms (e.g., wheelchair, bedside commode, etc,.)?: Unable Help needed moving to and from a bed to chair (including a wheelchair)?: A Little Help needed walking in hospital room?: A Little Help needed climbing 3-5 steps with a railing? : A Little 6 Click Score: 17    End of Session   Activity Tolerance: Patient tolerated treatment well Patient left: in chair;with call bell/phone within reach;Other (comment);with chair alarm set(pt wanting bed, but willing to stay up through lunch) Nurse Communication: Mobility status;Other (comment) PT Visit Diagnosis: Other abnormalities of gait and mobility (R26.89)     Time: 1110-1131 PT Time Calculation (min) (ACUTE ONLY): 21 min  Charges:  $Gait Training: 8-22 mins                    G Codes:       08/04/2017   Jordan Gardner, PT [phone]  (pager)   Jordan Gardner 08/04/2017, 12:30 PM

## 2017-08-04 NOTE — Progress Notes (Addendum)
Report given to Sports administratoratalie RN at [facility]. PTAR to be called. Patient going to room 109  Patient left unit via PTAR 16:16

## 2017-08-04 NOTE — Care Management Note (Signed)
Case Management Note  Patient Details  Name: Jordan Gardner MRN: [id number] Date of Birth: 08/19/1950  Subjective/Objective:                 Patient with order to discharge to Skilled Nursing Facility (SNF). SNF discharge facilitated through Clinical Social Work (CSW). Please refer to CSW notes for disposition plan.    Action/Plan:  DC to SNF Expected Discharge Date:  08/04/17               Expected Discharge Plan:  Skilled Nursing Facility  In-House Referral:  Clinical Social Work  Discharge planning Services  CM Consult  Post Acute Care Choice:    Choice offered to:     DME Arranged:    DME Agency:     HH Arranged:    HH Agency:     Status of Service:  Completed, signed off  If discussed at [employer] Meetings, dates discussed:    Additional Comments:  Jordan SabalDebbie Malanie Koloski, RN 08/04/2017, 1:45 PM

## 2017-08-04 NOTE — Discharge Summary (Signed)
Physician Discharge Summary  Jordan Gardner KZS:[id number] DOB: 08-Feb-1951  PCP: [facility]  Admit date: 07/28/2017 Discharge date: 08/04/2017  Recommendations for Outpatient Follow-up:  1. M.D. at SNF in 3 days with repeat labs (CBC & BMP). 2. Dr. Deitra Mayo, Vascular Surgery: Keep prior appointment for 08/26/17 at 2 PM. 3. Cardiology at [facility] 4. PCP at [facility] 5. Consider outpatient ENT consultation. 6. Recommend follow-up chest x-ray in 4 weeks to insure resolution of pneumonia findings.  Home Health: None Equipment/Devices: None    Discharge Condition: Improved and stable  CODE STATUS: Full  Diet recommendation: Heart healthy & diabetic diet.  Discharge Diagnoses:  Active Problems:   Acute hypoxemic respiratory failure [facility])   Brief Summary: 66 year old male, lives with his sister, ambulates with the help of a walker at home, veteran whose care has been fragmented and spread between the [place] medical system and locally, PMH of HTN, HLD, DM2, CAD, CABG 5 years ago at [facility], NSTEMI in September 2018 when he refused cardiac cath, admitted for chest pain in November 2018 when he underwent cath which demonstrated all vein grafts to be closed and did not have any intervention and felt to be high risk for cardiac surgery, chronic RBBB and LPFB, PAD/bilateral renal artery stenosis/ chronic lower extremity ischemia too high risk because of cardiac disease to undergo vascular surgery/status post left subclavian artery stent 07/23/17,  anxiety and depression, ongoing tobacco abuse, developmental delay presented to ED on 07/28/17 with acute worsening of chronic dyspnea, nausea and vomiting. He was intubated on arrival to the ED. Admitted to ICU. After stabilization, transferred from [facility] to [facility] on 08/01/17.   Assessment & Plan:   Active Problems:   Acute hypoxemic respiratory failure ([place])   1. VDRF: From CAP and acute on chronic diastolic CHF.  Intubated 12/11 and extubated 12/13. Hypoxia resolved and saturating normally on room air.  Respiratory status remained stable. 2. Community acquired pneumonia/right lower lobe: Initially treated with Zosyn. Blood cultures 2: Negative. BAL: Normal OP flora, AFB smear & Fungal stain negative. Urine culture negative. Transitioned to oral Augmentin and completed total 7 days antibiotic treatment. Recommend follow-up chest x-ray in 4 weeks to ensure resolution of pneumonia findings. Improved and stable. 3. Acute on chronic diastolic CHF: Continue aspirin, statins, Plavix, metoprolol.  Euvolemic.  Changed IV Lasix to oral Lasix 40 mg daily on 12/16. 2-D echo 06/27/17: Mild LVH, LVEF 50%. Hypokinesis of the mid to basal inferior/inferolateral walls. Grade 1 diastolic dysfunction. Outpatient follow-up with primary Cardiologist in the [place]. 4. CAD, s/p CABG: No chest pain reported. Continue aspirin, statins, Plavix and metoprolol. As per review of cardiology notes from November admission, [place] showed all vein grafts to be close, did not undergo any intervention and felt to be high risk for cardiac surgery. 5. Essential hypertension: Controlled. 6. Type II DM: Rated in the hospital with SSI and metformin was held. Creatinine has normalized. Resume metformin at discharge. A1c 7.1 on 07/28/17 suggests reasonable control. 7. Anemia of chronic disease and critical illness: Stable. 8. Hypokalemia: Replaced. Magnesium 1.9. 9. 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 Creatinine early November was 1.1. Creatinine peaked to 2 on 12/13. Creatinine has normalized. Periodically follow BMP.  10. 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 11. Lung nodules: Outpatient follow-up. 12. Depression/developmental delay: Continue Zoloft and Remeron. Stable. 13. Deconditioning:  DC to SNF for rehabilitation. 14. Tobacco abuse: Cessation  counseled.  Reported that he smokes one pack per day and willing to try nicotine patch. 15. Acute encephalopathy: Present on admission. Secondary to PNA & VDRF. Resolved. 16. Chronic headaches: Patient reports migraine headaches for which he takes Excedrin, continue. 17. Peripheral neuropathy: Reports long-standing history of peripheral neuropathy secondary to his diabetes.  Started low-dose gabapentin and titrate as outpatient. His bilateral lower extremity pain is likely a combination of PAD with rest pain and peripheral neuropathy. Maximum medical therapy as below. Outpatient follow-up with vascular surgery in January. No acute findings on exam at this time. 18. PAD/bilateral renal artery stenosis/chronic lower extremity ischemia: Continue medical therapy as above and outpatient follow-up with vascular surgery. 19. Severe malnutrition in the context of chronic illness: Management per RD.   Consultants:  CCM signed of 12/14   Procedures:  Intubated 12/11, extubated 12/13.    Discharge Instructions  Discharge Instructions    (HEART FAILURE PATIENTS) Call MD:  Anytime you have any of the following symptoms: 1) 3 pound weight gain in 24 hours or 5 pounds in 1 week 2) shortness of breath, with or without a dry hacking cough 3) swelling in the hands, feet or stomach 4) if you have to sleep on extra pillows at night in order to breathe.   Complete by:  As directed    Call MD for:  difficulty breathing, headache or visual disturbances   Complete by:  As directed    Call MD for:  extreme fatigue   Complete by:  As directed    Call MD for:  persistant dizziness or light-headedness   Complete by:  As directed    Call MD for:  severe uncontrolled pain   Complete by:  As directed    Call MD for:  temperature >100.4   Complete by:  As directed    Diet - low sodium heart healthy   Complete by:  As directed    Diet Carb Modified   Complete by:  As directed    Increase activity slowly    Complete by:  As directed        Medication List    TAKE these medications   aspirin 81 MG EC tablet Take 1 tablet (81 mg total) by mouth daily.   aspirin-acetaminophen-caffeine 250-250-65 MG tablet Commonly known as:  EXCEDRIN MIGRAINE Take 2 tablets by mouth every 8 (eight) hours as needed for headache.   atorvastatin 40 MG tablet Commonly known as:  LIPITOR Take 80 mg by mouth at bedtime.   clopidogrel 75 MG tablet Commonly known as:  PLAVIX Take 1 tablet (75 mg total) daily by mouth.   feeding supplement (GLUCERNA SHAKE) Liqd Take 237 mLs by mouth 2 (two) times daily between meals.   furosemide 40 MG tablet Commonly known as:  LASIX Take 1 tablet (40 mg total) by mouth daily. Start taking on:  08/05/2017   gabapentin 100 MG capsule Commonly known as:  NEURONTIN Take 1 capsule (100 mg total) by mouth 2 (two) times daily.   isosorbide mononitrate 30 MG 24 hr tablet Commonly known as:  IMDUR Take 2 tablets (60 mg total) daily by mouth.   lisinopril 2.5 MG tablet Commonly known as:  PRINIVIL,ZESTRIL Take 1 tablet (2.5 mg total) daily by mouth.   metFORMIN 500 MG tablet Commonly known as:  GLUCOPHAGE Take 1,000 mg by mouth 2 (two) times daily with a meal.   metoprolol tartrate 50 MG tablet Commonly known as:  LOPRESSOR Take 1 tablet (50 mg total) 2 (two)  times daily by mouth.   mirtazapine 15 MG tablet Commonly known as:  REMERON Take 15 mg by mouth at bedtime.   multivitamin with minerals Tabs tablet Take 1 tablet by mouth daily.   nicotine 14 mg/24hr patch Commonly known as:  NICODERM CQ - dosed in mg/24 hours Place 1 patch (14 mg total) onto the skin daily.   nitroGLYCERIN 0.4 MG SL tablet Commonly known as:  NITROSTAT Place 1 tablet (0.4 mg total) under the tongue every 5 (five) minutes x 3 doses as needed for chest pain.   oxyCODONE 5 MG immediate release tablet Commonly known as:  Oxy IR/ROXICODONE Take 1 tablet (5 mg total) by mouth every 6  (six) hours as needed for moderate pain. What changed:    how much to take  when to take this   sertraline 100 MG tablet Commonly known as:  ZOLOFT Take 100 mg by mouth daily. DEPRESSION/ANXIETY       Contact information for follow-up providers    [facility] Schedule an appointment as soon as possible for a visit.   Why:  Upon discharge from SNF. Contact information: [place] [address] [phone]        M.D. at SNF. Schedule an appointment as soon as possible for a visit in 3 day(s).   Why:  To be seen with repeat labs (CBC & BMP).       Angelia Mould, MD Follow up on 08/26/2017.   Specialties:  Vascular Surgery, Cardiology Why:  Keep prior appointment at 2 PM. Contact information: [address] [place] [address] [phone]        Cardiologist at [facility] Schedule an appointment as soon as possible for a visit.            Contact information for after-discharge care    [facility] Follow up.   Service:  Skilled Nursing Contact information: [address] [phone]                 No Known Allergies    Procedures/Studies: Ct Abdomen Pelvis Wo Contrast  Result Date: 07/28/2017 CLINICAL DATA:  66 year old male with increasing shortness of breath. Recent admission for myocardial infarction. Stent placement. Subsequent encounter. EXAM: CT CHEST, ABDOMEN AND PELVIS WITHOUT CONTRAST TECHNIQUE: Multidetector CT imaging of the chest, abdomen and pelvis was performed following the standard protocol without IV contrast. COMPARISON:  None. 07/28/2017 chest x-ray.  CT chest abdomen pelvis 04/25/2017. FINDINGS: CT CHEST FINDINGS Cardiovascular: Shift to the right. Coronary artery calcification. Post CABG. Post left common carotid stenting. Aortic calcification. Ascending thoracic aorta measures up to 4.1 cm.  Mediastinum/Nodes: Nasogastric tube in place. Mediastinal adenopathy greatest lower pretracheal and subcarinal region with maximal short axis dimension of 1.4 cm. Lungs/Pleura: Endotracheal tube tip 5 cm above the carina. Posteromedial right upper lobe 8 x 9 x 5 mm nodule (series 4, image 33 and series 12, image 75) may be minimally larger than on the prior examination when this measured 7 x 8 x 5 mm. 3 mm peripheral nodule right upper lobe (series 4, image 89). Bibasilar and right middle lobe consolidation may represent atelectasis as there is mild midline shift to the right. Infectious infiltrate is a secondary consideration. Bronchus intermedius may contain fluid or obstructing lesion such as mucous plug. Mild pulmonary vascular congestion. Musculoskeletal: Degenerative changes lower cervical spine. Post sternotomy. CT ABDOMEN PELVIS FINDINGS Hepatobiliary: Elongated liver spanning over  20.9 cm. Taking into account limitation by non contrast imaging, no worrisome hepatic lesion. Gallbladder sludge may be present. No calcified gallstones. Pancreas: Taking into account limitation by non contrast imaging, no obvious pancreatic mass or inflammation. Spleen: Taking into account limitation by non contrast imaging, no splenic mass or enlargement. Adrenals/Urinary Tract: Streak artifact limits evaluation. No obstructing stone or hydronephrosis. Right renal 2.5 cm cyst. Hyperplasia adrenal glands. Foley catheter in place with decompressed urinary bladder with circumferential wall thickening. Stomach/Bowel: Evaluation limited by streak artifact, under distention third spacing of fluid. No free air to suggest perforated viscus. Vascular/Lymphatic: Atherosclerotic changes abdominal aorta with ectasia without focal aneurysm. Atherosclerotic changes aortic branch vessels with mild ectasia internal iliac arteries. Not able to evaluate for vascular occlusion without contrast. Scattered normal size lymph nodes. Reproductive:  Negative Other: Third spacing of fluid. Musculoskeletal: Degenerative changes lumbar spine most notable L5-S1. IMPRESSION: Bibasilar and right middle lobe consolidation may represent atelectasis as there is mild midline shift to the right. Infectious infiltrate is a secondary consideration. Bronchus intermedius may contain fluid or obstructing lesion such as mucous plug. Mild pulmonary vascular congestion. Posteromedial right upper lobe 8 x 9 x 5 mm nodule (series 4, image 33 and series 12, image 75) may be minimally larger than on the prior examination when this measured 7 x 8 x 5 mm. Consider one of the following in 3 months for both low-risk and high-risk individuals: (a) repeat chest CT, (b) follow-up PET-CT, or (c) tissue sampling. This recommendation follows the consensus statement: Guidelines for Management of Incidental Pulmonary Nodules Detected on CT Images: From the Fleischner Society 2017; Radiology 2017; 284:228-243. Aortic Atherosclerosis (ICD10-I70.0). Ascending thoracic aorta measures up to 4.1 cm. Recommend annual imaging followup by CTA or MRA. This recommendation follows 2010 ACCF/AHA/AATS/ACR/ASA/SCA/SCAI/SIR/STS/SVM Guidelines for the Diagnosis and Management of Patients with Thoracic Aortic Disease. Circulation. 2010; 121: Y301-S010. Coronary artery calcifications.  Prior CABG. Stent left common carotid artery. Evaluation of stomach and bowel limited by streak artifact, under distention and third spacing of fluid. No free air to suggest perforated viscus. Atherosclerotic changes abdominal aorta with ectasia without focal aneurysm. Atherosclerotic changes aortic branch vessels with mild ectasia internal iliac arteries. Not able to evaluate for vascular occlusion without contrast. Electronically Signed   By: Genia Del M.D.   On: 07/28/2017 11:38   Ct Head Wo Contrast  Result Date: 07/28/2017 CLINICAL DATA:  Altered level of consciousness EXAM: CT HEAD WITHOUT CONTRAST TECHNIQUE:  Contiguous axial images were obtained from the base of the skull through the vertex without intravenous contrast. COMPARISON:  04/26/2017 FINDINGS: Brain: Mild atrophic changes are noted as well as areas of chronic white matter ischemic change relatively stable from the prior exam. Prior lacunar infarct is noted within the basal ganglia on the right. Bilateral basal ganglia calcifications are seen. No findings to suggest acute hemorrhage, acute infarction or space-occupying mass lesion are noted. Vascular: No hyperdense vessel or unexpected calcification. Skull: Normal. Negative for fracture or focal lesion. Sinuses/Orbits: No acute finding. Other: None. IMPRESSION: Mild atrophic changes and chronic white matter ischemic change stable from the prior exam. No acute abnormality noted. Electronically Signed   By: Inez Catalina M.D.   On: 07/28/2017 10:53   Ct Chest Wo Contrast  Result Date: 07/28/2017 CLINICAL DATA:  66 year old male with increasing shortness of breath. Recent admission for myocardial infarction. Stent placement. Subsequent encounter. EXAM: CT CHEST, ABDOMEN AND PELVIS WITHOUT CONTRAST TECHNIQUE: Multidetector CT imaging of the chest, abdomen and pelvis was performed  following the standard protocol without IV contrast. COMPARISON:  None. 07/28/2017 chest x-ray.  CT chest abdomen pelvis 04/25/2017. FINDINGS: CT CHEST FINDINGS Cardiovascular: Shift to the right. Coronary artery calcification. Post CABG. Post left common carotid stenting. Aortic calcification. Ascending thoracic aorta measures up to 4.1 cm. Mediastinum/Nodes: Nasogastric tube in place. Mediastinal adenopathy greatest lower pretracheal and subcarinal region with maximal short axis dimension of 1.4 cm. Lungs/Pleura: Endotracheal tube tip 5 cm above the carina. Posteromedial right upper lobe 8 x 9 x 5 mm nodule (series 4, image 33 and series 12, image 75) may be minimally larger than on the prior examination when this measured 7 x 8 x 5  mm. 3 mm peripheral nodule right upper lobe (series 4, image 89). Bibasilar and right middle lobe consolidation may represent atelectasis as there is mild midline shift to the right. Infectious infiltrate is a secondary consideration. Bronchus intermedius may contain fluid or obstructing lesion such as mucous plug. Mild pulmonary vascular congestion. Musculoskeletal: Degenerative changes lower cervical spine. Post sternotomy. CT ABDOMEN PELVIS FINDINGS Hepatobiliary: Elongated liver spanning over 20.9 cm. Taking into account limitation by non contrast imaging, no worrisome hepatic lesion. Gallbladder sludge may be present. No calcified gallstones. Pancreas: Taking into account limitation by non contrast imaging, no obvious pancreatic mass or inflammation. Spleen: Taking into account limitation by non contrast imaging, no splenic mass or enlargement. Adrenals/Urinary Tract: Streak artifact limits evaluation. No obstructing stone or hydronephrosis. Right renal 2.5 cm cyst. Hyperplasia adrenal glands. Foley catheter in place with decompressed urinary bladder with circumferential wall thickening. Stomach/Bowel: Evaluation limited by streak artifact, under distention third spacing of fluid. No free air to suggest perforated viscus. Vascular/Lymphatic: Atherosclerotic changes abdominal aorta with ectasia without focal aneurysm. Atherosclerotic changes aortic branch vessels with mild ectasia internal iliac arteries. Not able to evaluate for vascular occlusion without contrast. Scattered normal size lymph nodes. Reproductive: Negative Other: Third spacing of fluid. Musculoskeletal: Degenerative changes lumbar spine most notable L5-S1. IMPRESSION: Bibasilar and right middle lobe consolidation may represent atelectasis as there is mild midline shift to the right. Infectious infiltrate is a secondary consideration. Bronchus intermedius may contain fluid or obstructing lesion such as mucous plug. Mild pulmonary vascular  congestion. Posteromedial right upper lobe 8 x 9 x 5 mm nodule (series 4, image 33 and series 12, image 75) may be minimally larger than on the prior examination when this measured 7 x 8 x 5 mm. Consider one of the following in 3 months for both low-risk and high-risk individuals: (a) repeat chest CT, (b) follow-up PET-CT, or (c) tissue sampling. This recommendation follows the consensus statement: Guidelines for Management of Incidental Pulmonary Nodules Detected on CT Images: From the Fleischner Society 2017; Radiology 2017; 284:228-243. Aortic Atherosclerosis (ICD10-I70.0). Ascending thoracic aorta measures up to 4.1 cm. Recommend annual imaging followup by CTA or MRA. This recommendation follows 2010 ACCF/AHA/AATS/ACR/ASA/SCA/SCAI/SIR/STS/SVM Guidelines for the Diagnosis and Management of Patients with Thoracic Aortic Disease. Circulation. 2010; 121: Y301-S010. Coronary artery calcifications.  Prior CABG. Stent left common carotid artery. Evaluation of stomach and bowel limited by streak artifact, under distention and third spacing of fluid. No free air to suggest perforated viscus. Atherosclerotic changes abdominal aorta with ectasia without focal aneurysm. Atherosclerotic changes aortic branch vessels with mild ectasia internal iliac arteries. Not able to evaluate for vascular occlusion without contrast. Electronically Signed   By: Genia Del M.D.   On: 07/28/2017 11:38   Dg Chest Port 1 View  Result Date: 07/31/2017 CLINICAL DATA:  Respiratory failure.  Hypoxia. EXAM: PORTABLE CHEST 1 VIEW COMPARISON:  07/30/2017. FINDINGS: Prior CABG. Fractured sternal wiring noted. Cardiomegaly. Persistent atelectasis/infiltrate right lung base with slight improvement. New onset left base atelectasis. Elevation left hemidiaphragm. No pleural effusion or pneumothorax. IMPRESSION: 1. Persistent but slightly improved right base atelectasis/ infiltrate. New onset left base atelectasis with elevation left hemidiaphragm.  2. Prior CABG.  Stable cardiomegaly. Electronically Signed   By: Marcello Moores  Register   On: 07/31/2017 06:34   Dg Chest Port 1 View  Result Date: 07/30/2017 CLINICAL DATA:  Intubation. EXAM: PORTABLE CHEST 1 VIEW COMPARISON:  CT 07/28/2017.  Chest x-ray 07/28/2017. FINDINGS: Endotracheal tube and NG tube in stable position. Prior CABG. Heart size normal. Improvement aeration scratched it significant improvement in aeration both lungs with persistent atelectasis/ infiltrate right lung base. No prominent pleural effusion or pneumothorax. Left costophrenic angle not completely imaged. IMPRESSION: 1. Endotracheal tube and NG tube in stable position. 2. Significant improvement aeration both lungs with persist atelectasis/infiltrate right lung base. 3.  Prior CABG.  Heart size normal.  No pulmonary venous congestion. Electronically Signed   By: Marcello Moores  Register   On: 07/30/2017 06:34   Dg Chest Portable 1 View  Result Date: 07/28/2017 CLINICAL DATA:  Respiratory failure, NSTEMI, intubated patient. EXAM: PORTABLE CHEST 1 VIEW COMPARISON:  Chest x-ray of earlier today prior to intubation FINDINGS: The endotracheal tube tip lies approximately 5.8 cm above the carina. The lungs are well-expanded. The perihilar lung markings remain increased. There is no pneumothorax or pleural effusion. The heart is normal in size. The pulmonary vascularity is engorged. There are post CABG changes. There are broken sternal wires present which are unchanged. The esophagogastric tube tip projects below the inferior margin of the image. IMPRESSION: The endotracheal tube tip lies 5.8 cm above the carina. Pulmonary interstitial and early alveolar edema. No discrete pneumonia. Previous CABG. Electronically Signed   By: David  Martinique M.D.   On: 07/28/2017 09:46   Dg Chest Portable 1 View  Result Date: 07/28/2017 CLINICAL DATA:  Shortness of breath EXAM: PORTABLE CHEST 1 VIEW COMPARISON:  06/26/2017 FINDINGS: Bilateral diffuse interstitial  thickening. Trace right pleural effusion. No pneumothorax. No focal consolidation. Stable cardiomediastinal silhouette. Prior CABG. No acute osseous abnormality. IMPRESSION: Findings concerning for mild pulmonary edema. Electronically Signed   By: Kathreen Devoid   On: 07/28/2017 08:21   Dg Abd Portable 1v  Result Date: 07/28/2017 CLINICAL DATA:  Orogastric tube placement. EXAM: PORTABLE ABDOMEN - 1 VIEW COMPARISON:  Radiograph of same day. FINDINGS: The bowel gas pattern is normal. Distal tip of nasogastric tube is seen in expected position of proximal stomach. Surgical clips are noted in the left upper quadrant. IMPRESSION: Distal tip of nasogastric tube seen in expected position of proximal stomach. No evidence of bowel obstruction or ileus. Electronically Signed   By: Marijo Conception, M.D.   On: 07/28/2017 21:27   Dg Abd Portable 1v  Result Date: 07/28/2017 CLINICAL DATA:  Evaluate for orogastric tube placement. EXAM: PORTABLE ABDOMEN - 1 VIEW COMPARISON:  CT abdomen and pelvis from earlier on the same day. FINDINGS: Motion artifacts are seen in the upper abdomen. There is a gastric tube that extends below the left hemidiaphragm with what appears to be the side port of the gastric tube below the left hemidiaphragm as well allowing for motion artifacts. The included heart is borderline enlarged with median sternotomy sutures. Bowel gas pattern is unremarkable. IMPRESSION: Motion related artifacts limit assessment. There appears to be a gastric tube with  tip and side port below the left hemidiaphragm, in the expected location of the stomach similar to CT findings. To be more certain of gastric tube positioning within the stomach, further advancement is suggested by 3-4 cm. Electronically Signed   By: Ashley Royalty M.D.   On: 07/28/2017 21:25      Subjective: No new complaints. No headache reported. Ongoing intermittent bilateral feet pain. No cough, dyspnea, chest pain, palpitations, dizziness or  lightheadedness.  Discharge Exam:  Vitals:   08/03/17 1758 08/03/17 2212 08/04/17 0614 08/04/17 1000  BP: (!) 155/90 (!) 148/84 (!) 149/45 137/78  Pulse: 69 67  93  Resp: _0 Temp: 98.2 F (36.8 C) 98.5 F (36.9 C) 97.9 F (36.6 C) 98.2 F (36.8 C)  TempSrc: Oral Oral Oral Oral  SpO2: 100% 100% 100% 100%  Weight:      Height:        General exam: Middle-aged male, moderately built and thinly nourished, lying comfortably propped up in bed. Does not appear in any distress. Respiratory system: Clear to auscultation.  No increased work of breathing.  Cardiovascular system: S1 & S2 heard, RRR. No JVD, murmurs, rubs, gallops or clicks. No pedal edema.  Telemetry personally reviewed: SR with BBB morphology. Gastrointestinal system: Abdomen is nondistended, soft and nontender. No organomegaly or masses felt. Normal bowel sounds heard.  Central nervous system: Alert and oriented. No focal neurological deficits.  Extremities: Symmetric 5 x 5 power.  Skin: No rashes, lesions or ulcers Psychiatry: Judgement and insight may be impaired . Mood & affect appropriate.       The results of significant diagnostics from this hospitalization (including imaging, microbiology, ancillary and laboratory) are listed below for reference.     Microbiology: Recent Results (from the past 240 hour(s))  Blood culture (routine x 2)     Status: None   Collection Time: 07/28/17  8:15 AM  Result Value Ref Range Status   Specimen Description BLOOD LEFT FOREARM  Final   Special Requests   Final    BOTTLES DRAWN AEROBIC AND ANAEROBIC Blood Culture adequate volume   Culture NO GROWTH 5 DAYS  Final   Report Status 08/02/2017 FINAL  Final  Blood culture (routine x 2)     Status: None   Collection Time: 07/28/17 10:07 AM  Result Value Ref Range Status   Specimen Description BLOOD RIGHT FOREARM  Final   Special Requests   Final    BOTTLES DRAWN AEROBIC ONLY Blood Culture adequate volume   Culture NO  GROWTH 5 DAYS  Final   Report Status 08/02/2017 FINAL  Final  Urine culture     Status: None   Collection Time: 07/28/17 10:25 AM  Result Value Ref Range Status   Specimen Description URINE, CLEAN CATCH  Final   Special Requests Normal  Final   Culture NO GROWTH  Final   Report Status 07/29/2017 FINAL  Final  MRSA PCR Screening     Status: None   Collection Time: 07/28/17  1:25 PM  Result Value Ref Range Status   MRSA by PCR NEGATIVE NEGATIVE Final    Comment:        The GeneXpert MRSA Assay (FDA approved for NASAL specimens only), is one component of a comprehensive MRSA colonization surveillance program. It is not intended to diagnose MRSA infection nor to guide or monitor treatment for MRSA infections.   Acid Fast Smear (AFB)     Status: None   Collection Time: 07/28/17  5:32 PM  Result Value Ref Range Status   AFB Specimen Processing Concentration  Final   Acid Fast Smear Negative  Final    Comment: (NOTE) Performed At: [facility] [place], [place] [id number] Rush Farmer MD UK:[id number]    Source (AFB) BRONCHIAL ALVEOLAR LAVAGE  Final  Fungus Culture With Stain     Status: None (Preliminary result)   Collection Time: 07/28/17  5:32 PM  Result Value Ref Range Status   Fungus Stain Final report  Final    Comment: (NOTE) Performed At: [facility] [place], [place] [id number] Rush Farmer MD HE:[id number]    Fungus (Mycology) Culture PENDING  Incomplete   Fungal Source BRONCHIAL ALVEOLAR LAVAGE  Final  Culture, respiratory (NON-Expectorated)     Status: None   Collection Time: 07/28/17  5:32 PM  Result Value Ref Range Status   Specimen Description BRONCHIAL ALVEOLAR LAVAGE  Final   Special Requests BRONCHIAL ALVEOLAR LAVAGE  Final   Gram Stain   Final    ABUNDANT WBC PRESENT,BOTH PMN AND MONONUCLEAR NO ORGANISMS SEEN    Culture Consistent with normal respiratory flora.  Final   Report Status 07/31/2017 FINAL   Final  Fungus Culture Result     Status: None   Collection Time: 07/28/17  5:32 PM  Result Value Ref Range Status   Result 1 Comment  Final    Comment: (NOTE) KOH/Calcofluor preparation:  no fungus observed. Performed At: [facility] [place], [place] [id number] Rush Farmer MD OE:[id number]      Labs: CBC: Recent Labs  Lab 07/30/17 0459 07/31/17 0329 08/01/17 0434  WBC 11.3* 11.3* 10.0  HGB 9.9* 10.8* 9.8*  HCT 30.4* 32.4* 30.2*  MCV 81.9 82.0 82.7  PLT 177 188 051   Basic Metabolic Panel: Recent Labs  Lab 07/29/17 0331 07/30/17 0459 07/31/17 0329 08/01/17 0434 08/02/17 0503 08/03/17 0535  NA 140 139 137 138 141 141  K 4.3 3.6 3.4* 3.6 3.9 4.0  CL 108 106 103 104 106 110  CO2 20* 21* _0 GLUCOSE 101* 143* 108* 170* 150* 165*  BUN 24* 25* 21* _1 CREATININE 1.93* 2.00* 1.57* 1.56* 1.40* 1.16  CALCIUM 8.5* 8.4* 8.3* 8.4* 8.6* 8.8*  MG 1.3* 1.6* 1.7 1.9  --   --   PHOS 3.1 2.9 4.4  --   --   --    BNP (last 3 results) Recent Labs    04/25/17 0433 07/28/17 0813  BNP 404.5* 3,888.5*   Cardiac Enzymes: No results for input(s): CKTOTAL, CKMB, CKMBINDEX, TROPONINI in the last 168 hours. CBG: Recent Labs  Lab 08/03/17 1200 08/03/17 1726 08/03/17 2212 08/04/17 0724 08/04/17 1128  GLUCAP 210* 137* 127* 131* 224*   Urinalysis    Component Value Date/Time   COLORURINE AMBER (A) 07/28/2017 1025   APPEARANCEUR HAZY (A) 07/28/2017 1025   LABSPEC 1.026 07/28/2017 1025   PHURINE 5.0 07/28/2017 1025   GLUCOSEU NEGATIVE 07/28/2017 1025   HGBUR NEGATIVE 07/28/2017 1025   BILIRUBINUR NEGATIVE 07/28/2017 1025   KETONESUR NEGATIVE 07/28/2017 1025   PROTEINUR >=300 (A) 07/28/2017 1025   NITRITE NEGATIVE 07/28/2017 1025   LEUKOCYTESUR NEGATIVE 07/28/2017 1025   Discussed with patient's sister. Updated care and answered questions.    Time coordinating discharge: Over 30 minutes  SIGNED:  Vernell Leep, MD, FACP,  [facility]. [facility] Pager [phone]  If 7PM-7AM, please contact night-coverage [email or web address] Password [facility] 08/04/2017, 12:26 PM

## 2017-08-04 NOTE — Discharge Instructions (Signed)

## 2017-08-04 NOTE — Care Management Important Message (Signed)
Important Message  Patient Details  Name: Carmelina DaneDesferrino Verdell MRN: [id number] Date of Birth: 01/28/1951   Medicare Important Message Given:  Yes    Lashayla Armes Stefan ChurchBratton 08/04/2017, 2:46 PM

## 2017-08-26 ENCOUNTER — Encounter: Payer: Medicare HMO | Admitting: Vascular Surgery

## 2017-08-26 LAB — FUNGUS CULTURE WITH STAIN

## 2017-08-26 LAB — FUNGAL ORGANISM REFLEX

## 2017-08-26 LAB — FUNGUS CULTURE RESULT

## 2017-09-08 ENCOUNTER — Telehealth: Payer: Self-pay | Admitting: Vascular Surgery

## 2017-09-08 ENCOUNTER — Encounter: Payer: Self-pay | Admitting: Vascular Surgery

## 2017-09-08 ENCOUNTER — Ambulatory Visit (INDEPENDENT_AMBULATORY_CARE_PROVIDER_SITE_OTHER): Payer: Medicare HMO | Admitting: Vascular Surgery

## 2017-09-08 VITALS — BP 104/68 | HR 83 | Temp 97.5°F | Resp 20 | Ht 73.0 in | Wt 158.9 lb

## 2017-09-08 DIAGNOSIS — Z48812 Encounter for surgical aftercare following surgery on the circulatory system: Secondary | ICD-10-CM

## 2017-09-08 DIAGNOSIS — I739 Peripheral vascular disease, unspecified: Secondary | ICD-10-CM

## 2017-09-08 NOTE — Progress Notes (Addendum)
Patient name: Jordan Gardner MRN: [id number] DOB: 02/26/1951 Sex: male  REASON FOR VISIT:   Follow-up after angioplasty and stenting of the left subclavian artery.  And follow-up of critical limb ischemia.  I suspect Jordan Gardner  HPI:   Jordan Gardner is a pleasant 67 y.o. male who had presented with chest pain.  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  He underwent exposure of the left brachial artery, left subclavian artery angioplasty and stenting using a covered stent on 07/23/2017.  He comes in for a follow-up visit.  He denies any chest pain.  His main complaint is pain in both feet.  He has rest pain bilaterally.  He has nonhealing wounds on both feet.  His activity is very limited.  He is malnourished.  He has a sacral decubitus which I think is slowly getting better.  In addition, I had previously performed an arteriogram on the patient on 06/08/2017.  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  He also had a wound on the left foot.  I did not think that he was at ideal operative candidate given that he had a sacral decubitus and malnutrition.  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  His options for revascularization would be an aortofemoral bypass and left femoropopliteal bypass versus axillobifemoral bypass and left femoropopliteal bypass.  If he was felt to be too high risk for surgery then he would more likely require a primary amputation of the wound on the left leg progressed.   Current Outpatient Medications  Medication Sig Dispense Refill  . aspirin EC 81 MG EC tablet Take 1 tablet (81 mg total) by mouth daily.    Marland Kitchen. aspirin-acetaminophen-caffeine (EXCEDRIN MIGRAINE) 250-250-65 MG tablet Take 2 tablets by mouth every 8 (eight) hours as needed for headache.    Marland Kitchen. atorvastatin (LIPITOR) 40 MG tablet Take 80 mg by mouth at bedtime.    . feeding supplement,  GLUCERNA SHAKE, (GLUCERNA SHAKE) LIQD Take 237 mLs by mouth 2 (two) times daily between meals.  0  . furosemide (LASIX) 40 MG tablet Take 1 tablet (40 mg total) by mouth daily.    Marland Kitchen. gabapentin (NEURONTIN) 100 MG capsule Take 1 capsule (100 mg total) by mouth 2 (two) times daily.    . isosorbide mononitrate (IMDUR) 30 MG 24 hr tablet Take 2 tablets (60 mg total) daily by mouth.    Marland Kitchen. lisinopril (PRINIVIL,ZESTRIL) 2.5 MG tablet Take 1 tablet (2.5 mg total) daily by mouth. 30 tablet 1  . metFORMIN (GLUCOPHAGE) 500 MG tablet Take 1,000 mg by mouth 2 (two) times daily with a meal.    . metoprolol tartrate (LOPRESSOR) 50 MG tablet Take 1 tablet (50 mg total) 2 (two) times daily by mouth. 60 tablet 1  . mirtazapine (REMERON) 15 MG tablet Take 15 mg by mouth at bedtime.    . Multiple Vitamin (MULTIVITAMIN WITH MINERALS) TABS tablet Take 1 tablet by mouth daily.    . nicotine (NICODERM CQ - DOSED IN MG/24 HOURS) 14 mg/24hr patch Place 1 patch (14 mg total) onto the skin daily. 28 patch 0  . nitroGLYCERIN (NITROSTAT) 0.4 MG SL tablet Place 1 tablet (0.4 mg total) under the tongue every 5 (five) minutes x 3 doses as needed for chest pain.  12  . oxyCODONE (OXY IR/ROXICODONE) 5 MG immediate release tablet Take 1 tablet (5 mg total) by mouth every  6 (six) hours as needed for moderate pain. 20 tablet 0  . sertraline (ZOLOFT) 100 MG tablet Take 100 mg by mouth daily. DEPRESSION/ANXIETY     No current facility-administered medications for this visit.     REVIEW OF SYSTEMS:  [X]  denotes positive finding, [ ]  denotes negative finding Cardiac  Comments:  Chest pain or chest pressure:    Shortness of breath upon exertion:    Short of breath when lying flat:    Irregular heart rhythm:    Constitutional    Fever or chills:     PHYSICAL EXAM:   Vitals:   09/08/17 0940 09/08/17 0941  BP: 103/72 104/68  Pulse: 83   Resp: 20   Temp: (!) 97.5 F (36.4 C)   TempSrc: Oral   SpO2: 97%   Weight: 158 lb 14.4 oz  (72.1 kg)   Height: 6\' 1"  (1.854 m)     GENERAL: The patient is a well-nourished male, in no acute distress. The vital signs are documented above. CARDIOVASCULAR: There is a regular rate and rhythm. PULMONARY: There is good air exchange bilaterally without wheezing or rales. In the left upper extremity has a palpable brachial pulse.  I cannot palpate a radial pulse. In the right upper extremity he has a palpable brachial pulse. He has diminished femoral pulses bilaterally. I cannot palpate pedal pulses. 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  DATA:   ARTERIOGRAM: His arteriogram that was done on 06/08/2017 showed diffuse aortoiliac occlusive disease.  On the left side, there was diffuse disease of the common iliac artery and external iliac artery.  There was a tight stenosis in the proximal deep femoral artery on the left.  The superficial femoral artery was occluded at its origin with reconstitution of the popliteal artery at the level of the knee.  There was two-vessel runoff via the anterior tibial and posterior tibial arteries which had mild diffuse disease.  On the right side, there was diffuse disease of the common iliac and external iliac artery.  There was also significant plaque in the common femoral artery.  The superficial femoral artery was occluded at the origin.  There is reconstitution of the popliteal artery at the level of the knee.  There was single vessel runoff on the right via the posterior tibial artery.  MEDICAL ISSUES:   CRITICAL LIMB ISCHEMIA: This patient has nonhealing wounds of both feet.  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  He has rest pain.  He has multilevel arterial occlusive disease.  On the left side, we could consider a profundoplasty and left femoropopliteal bypass.  On the right side, consider angioplasty and stenting of the right iliac system and then proceed with right femoral to  below-knee popliteal artery bypass.  An alternative option for revascularization would be a right axillobifemoral bypass and bilateral femoropopliteal bypasses to the below-knee popliteal arteries.  This patient had a recent non-ST MI and certainly would be at very high risk for surgery.  We will have him referred back to cardiology to be evaluated.  If he is felt to be at too high risk that he would require primary above-the-knee amputations if the wounds progress.  If he had a right axillobifemoral bypass, this would need to originate from the right axillary artery given that he has a LIMA graft on the left and also a subclavian stent on the left.  He  comes back after his cardiac evaluation I have ordered a duplex to evaluate the subclavian and axillary artery on the right.  He appears to have evidence of upper extremity arterial occlusive disease also and may not be a candidate for an axillofemoral graft if he has significant disease on the right.  This patient would clearly be at high risk for any surgery given his cardiac disease, his severe protein calorie malnutrition, and debilitated state.  SACRAL DECUBITUS: On my exam this appears to be healing.  STATUS POST LEFT SUBCLAVIAN STENT: Stent appears to be patent.  He is on aspirin and is on a statin.  Waverly Ferrari [facility] [phone]

## 2017-09-08 NOTE — Telephone Encounter (Signed)
Pt needs a cardiac clearance before we can sch surgery. [facility] sees Dr[facility] Jerolyn CenterMathews there for his heart.  [facility] and got to the cardiology department. They said that the pt is not a current cardiology pt and Dr. Jerolyn CenterMathews is his PCP. They said they need Dr. Jerolyn CenterMathews to refer the pt to cardiology before they can make an appt.  They transferred me to Dr. Jerolyn CenterMathews' office. I spoke to his nurse. She said the the pt has an appt with Dr. Jerolyn CenterMathews tomorrow 09/09/17 and to fax over the clearance sheet to them and they will take care of the cardiac clearance.  I faxed clearance form to [phone]. I put to send clearance back as "ATTN Kay". I let Joyce GrossKay know that pt will be seeing PCP tomorrow and that they will take care of the clearance.

## 2017-09-10 LAB — ACID FAST CULTURE WITH REFLEXED SENSITIVITIES: ACID FAST CULTURE - AFSCU3: NEGATIVE

## 2017-10-22 ENCOUNTER — Other Ambulatory Visit: Payer: Self-pay

## 2017-10-22 ENCOUNTER — Encounter (HOSPITAL_COMMUNITY): Payer: Self-pay | Admitting: Emergency Medicine

## 2017-10-22 ENCOUNTER — Ambulatory Visit (HOSPITAL_COMMUNITY)
Admission: EM | Admit: 2017-10-22 | Discharge: 2017-10-22 | Disposition: A | Discharge status: Home / Self Care | Payer: Medicare HMO | Attending: Family Medicine | Admitting: Family Medicine

## 2017-10-22 DIAGNOSIS — I96 Gangrene, not elsewhere classified: Secondary | ICD-10-CM | POA: Diagnosis not present

## 2017-10-22 MED ORDER — MUPIROCIN 2 % EX OINT: 1.0000 "application " | TOPICAL_OINTMENT | Freq: Three times a day (TID) | CUTANEOUS | 3 refills | Status: DC

## 2017-10-22 NOTE — ED Triage Notes (Signed)
Reports sores and pain in both feet.    Home health nurse told patient's family to take him to emergency department.  Feet have worsened lately.

## 2017-10-22 NOTE — ED Notes (Signed)
Notified lauenstein.

## 2017-10-22 NOTE — Discharge Instructions (Signed)
I recommend that you follow up with orthopedic specialist.

## 2017-10-22 NOTE — ED Provider Notes (Signed)
[facility]   [id number] 10/22/17 Arrival Time: 1529   SUBJECTIVE:  Jordan Gardner is a 67 y.o. male who presents to the urgent care with complaint of sores and pain in both feet.    Home health nurse told patient's family to take him to emergency department.  Feet have worsened lately.    Long h/o ASCD and diabetes.  Past Medical History:  Diagnosis Date  . Anxiety   . Arthritis   . Asthma   . Coronary artery disease   . Depression   . Diabetes mellitus without complication (HCC)    type 2  . Headache   . Hyperlipidemia   . Hypertension   . Myocardial infarction (HCC)   . Neuromuscular disorder (HCC)   . PAD (peripheral artery disease) (HCC)   . Protein calorie malnutrition (HCC) 04/2017  . Psychotic affective disorder (HCC)    Per sister. Unsure of Dx. Pt sleeps late and difficult to wake. odd affect  . Sacral decubitus ulcer 04/2017  . Stroke [facility])    left side weakness   Family History  Problem Relation Age of Onset  . Hypertension Sister    Social History   Socioeconomic History  . Marital status: Single    Spouse name: Not on file  . Number of children: Not on file  . Years of education: Not on file  . Highest education level: Not on file  Social Needs  . Financial resource strain: Not on file  . Food insecurity - worry: Not on file  . Food insecurity - inability: Not on file  . Transportation needs - medical: Not on file  . Transportation needs - non-medical: Not on file  Occupational History  . Not on file  Tobacco Use  . Smoking status: Current Every Day Smoker    Packs/day: 1.00    Types: Cigarettes  . Smokeless tobacco: Never Used  Substance and Sexual Activity  . Alcohol use: No  . Drug use: No  . Sexual activity: Not on file  Other Topics Concern  . Not on file  Social History Narrative  . Not on file   No outpatient medications have been marked as taking for the 10/22/17 encounter [facility] Encounter).   No Known  Allergies    ROS: As per HPI, remainder of ROS negative.   OBJECTIVE:   Vitals:   10/22/17 1625  BP: 132/77  Pulse: 81  Resp: (!) 26  Temp: 99 F (37.2 C)  TempSrc: Oral  SpO2: 97%     General appearance: alert; no distress Eyes: PERRL; EOMI; conjunctiva normal HENT: normocephalic; atraumatic;  Back: no CVA tenderness Extremities: no cyanosis or edema; the toe and second toe on right foot showed distal necrotic dry tissue on the tips, the toes on the left (great toe, second and third toes) also have dry gangrene at the tips Skin: warm and dry; dry gangrene as described above. Neurologic: normal gait; grossly normal Psychological: alert and cooperative; normal mood and affect      Labs:  Results for orders placed or performed during the hospital encounter of 07/28/17  Blood culture (routine x 2)  Result Value Ref Range   Specimen Description BLOOD LEFT FOREARM    Special Requests      BOTTLES DRAWN AEROBIC AND ANAEROBIC Blood Culture adequate volume   Culture NO GROWTH 5 DAYS    Report Status 08/02/2017 FINAL   Blood culture (routine x 2)  Result Value Ref Range   Specimen Description BLOOD  RIGHT FOREARM    Special Requests      BOTTLES DRAWN AEROBIC ONLY Blood Culture adequate volume   Culture NO GROWTH 5 DAYS    Report Status 08/02/2017 FINAL   Urine culture  Result Value Ref Range   Specimen Description URINE, CLEAN CATCH    Special Requests Normal    Culture NO GROWTH    Report Status 07/29/2017 FINAL   MRSA PCR Screening  Result Value Ref Range   MRSA by PCR NEGATIVE NEGATIVE  Acid Fast Smear (AFB)  Result Value Ref Range   AFB Specimen Processing Concentration    Acid Fast Smear Negative    Source (AFB) BRONCHIAL ALVEOLAR LAVAGE   Acid Fast Culture with reflexed sensitivities  Result Value Ref Range   Acid Fast Culture Negative    Source of Sample BRONCHIAL ALVEOLAR LAVAGE   Fungus Culture With Stain  Result Value Ref Range   Fungus Stain  Final report    Fungus (Mycology) Culture Final report    Fungal Source BRONCHIAL ALVEOLAR LAVAGE   Culture, respiratory (NON-Expectorated)  Result Value Ref Range   Specimen Description BRONCHIAL ALVEOLAR LAVAGE    Special Requests BRONCHIAL ALVEOLAR LAVAGE    Gram Stain      ABUNDANT WBC PRESENT,BOTH PMN AND MONONUCLEAR NO ORGANISMS SEEN    Culture Consistent with normal respiratory flora.    Report Status 07/31/2017 FINAL   Fungus Culture Result  Result Value Ref Range   Result 1 Comment   Fungal organism reflex  Result Value Ref Range   Fungal result 1 Comment   CBC with Differential  Result Value Ref Range   WBC 10.5 4.0 - 10.5 K/uL   RBC 4.08 (L) 4.22 - 5.81 MIL/uL   Hemoglobin 11.1 (L) 13.0 - 17.0 g/dL   HCT 40.9 (L) 81.1 - 91.4 %   MCV 83.8 78.0 - 100.0 fL   MCH 27.2 26.0 - 34.0 pg   MCHC 32.5 30.0 - 36.0 g/dL   RDW 78.2 95.6 - 21.3 %   Platelets 271 150 - 400 K/uL   Neutrophils Relative % 79 %   Neutro Abs 8.4 (H) 1.7 - 7.7 K/uL   Lymphocytes Relative 15 %   Lymphs Abs 1.6 0.7 - 4.0 K/uL   Monocytes Relative 4 %   Monocytes Absolute 0.4 0.1 - 1.0 K/uL   Eosinophils Relative 1 %   Eosinophils Absolute 0.1 0.0 - 0.7 K/uL   Basophils Relative 1 %   Basophils Absolute 0.1 0.0 - 0.1 K/uL  Comprehensive metabolic panel  Result Value Ref Range   Sodium 136 135 - 145 mmol/L   Potassium 5.6 (H) 3.5 - 5.1 mmol/L   Chloride 106 101 - 111 mmol/L   CO2 13 (L) 22 - 32 mmol/L   Glucose, Bld 241 (H) 65 - 99 mg/dL   BUN 23 (H) 6 - 20 mg/dL   Creatinine, Ser 0.86 (H) 0.61 - 1.24 mg/dL   Calcium 9.1 8.9 - 57.8 mg/dL   Total Protein 6.5 6.5 - 8.1 g/dL   Albumin 3.3 (L) 3.5 - 5.0 g/dL   AST 94 (H) 15 - 41 U/L   ALT 91 (H) 17 - 63 U/L   Alkaline Phosphatase 119 38 - 126 U/L   Total Bilirubin 0.8 0.3 - 1.2 mg/dL   GFR calc non Af Amer 40 (L) >60 mL/min   GFR calc Af Amer 46 (L) >60 mL/min   Anion gap 17 (H) 5 - 15  Lipase, blood  Result Value Ref Range   Lipase 16 11 -  51 U/L  Brain natriuretic peptide  Result Value Ref Range   B Natriuretic Peptide 3,888.5 (H) 0.0 - 100.0 pg/mL  Troponin I  Result Value Ref Range   Troponin I 0.08 (HH) <0.03 ng/mL  Urinalysis, Routine w reflex microscopic  Result Value Ref Range   Color, Urine AMBER (A) YELLOW   APPearance HAZY (A) CLEAR   Specific Gravity, Urine 1.026 1.005 - 1.030   pH 5.0 5.0 - 8.0   Glucose, UA NEGATIVE NEGATIVE mg/dL   Hgb urine dipstick NEGATIVE NEGATIVE   Bilirubin Urine NEGATIVE NEGATIVE   Ketones, ur NEGATIVE NEGATIVE mg/dL   Protein, ur >=098 (A) NEGATIVE mg/dL   Nitrite NEGATIVE NEGATIVE   Leukocytes, UA NEGATIVE NEGATIVE   RBC / HPF 0-5 0 - 5 RBC/hpf   WBC, UA 0-5 0 - 5 WBC/hpf   Bacteria, UA RARE (A) NONE SEEN   Squamous Epithelial / LPF 0-5 (A) NONE SEEN   Hyaline Casts, UA PRESENT   Triglycerides  Result Value Ref Range   Triglycerides 85 <150 mg/dL  Lactic acid, plasma  Result Value Ref Range   Lactic Acid, Venous 3.2 (HH) 0.5 - 1.9 mmol/L  Lactic acid, plasma  Result Value Ref Range   Lactic Acid, Venous 2.6 (HH) 0.5 - 1.9 mmol/L  Procalcitonin  Result Value Ref Range   Procalcitonin <0.10 ng/mL  Protime-INR  Result Value Ref Range   Prothrombin Time 16.5 (H) 11.4 - 15.2 seconds   INR 1.35   APTT  Result Value Ref Range   aPTT 33 24 - 36 seconds  Hemoglobin A1c  Result Value Ref Range   Hgb A1c MFr Bld 7.1 (H) 4.8 - 5.6 %   Mean Plasma Glucose 157.07 mg/dL  Glucose, capillary  Result Value Ref Range   Glucose-Capillary 188 (H) 65 - 99 mg/dL   Comment 1 Notify RN   Glucose, capillary  Result Value Ref Range   Glucose-Capillary 161 (H) 65 - 99 mg/dL   Comment 1 Capillary Specimen    Comment 2 Notify RN   Basic metabolic panel  Result Value Ref Range   Sodium 140 135 - 145 mmol/L   Potassium 4.3 3.5 - 5.1 mmol/L   Chloride 108 101 - 111 mmol/L   CO2 20 (L) 22 - 32 mmol/L   Glucose, Bld 101 (H) 65 - 99 mg/dL   BUN 24 (H) 6 - 20 mg/dL   Creatinine, Ser  1.19 (H) 0.61 - 1.24 mg/dL   Calcium 8.5 (L) 8.9 - 10.3 mg/dL   GFR calc non Af Amer 35 (L) >60 mL/min   GFR calc Af Amer 40 (L) >60 mL/min   Anion gap 12 5 - 15  Magnesium  Result Value Ref Range   Magnesium 1.3 (L) 1.7 - 2.4 mg/dL  Phosphorus  Result Value Ref Range   Phosphorus 3.1 2.5 - 4.6 mg/dL  Body fluid cell count with differential  Result Value Ref Range   Fluid Type-FCT BRONCHIAL ALVEOLAR LAVAGE    Color, Fluid COLORLESS (A) YELLOW   Appearance, Fluid HAZY (A) CLEAR   WBC, Fluid 305 0 - 1,000 cu mm   Neutrophil Count, Fluid 44 (H) 0 - 25 %   Lymphs, Fluid 5 %   Monocyte-Macrophage-Serous Fluid 49 (L) 50 - 90 %   Eos, Fluid 2 %  Glucose, capillary  Result Value Ref Range   Glucose-Capillary 98 65 - 99 mg/dL   Comment  1 Capillary Specimen    Comment 2 Notify RN   Glucose, capillary  Result Value Ref Range   Glucose-Capillary 117 (H) 65 - 99 mg/dL   Comment 1 Capillary Specimen    Comment 2 Notify RN   Glucose, capillary  Result Value Ref Range   Glucose-Capillary 94 65 - 99 mg/dL   Comment 1 Capillary Specimen    Comment 2 Notify RN   Glucose, capillary  Result Value Ref Range   Glucose-Capillary 95 65 - 99 mg/dL   Comment 1 Capillary Specimen    Comment 2 Notify RN   Glucose, capillary  Result Value Ref Range   Glucose-Capillary 136 (H) 65 - 99 mg/dL   Comment 1 Capillary Specimen    Comment 2 Notify RN   Pathologist smear review  Result Value Ref Range   Path Review          Glucose, capillary  Result Value Ref Range   Glucose-Capillary 86 65 - 99 mg/dL   Comment 1 Capillary Specimen    Comment 2 Notify RN   Basic metabolic panel  Result Value Ref Range   Sodium 139 135 - 145 mmol/L   Potassium 3.6 3.5 - 5.1 mmol/L   Chloride 106 101 - 111 mmol/L   CO2 21 (L) 22 - 32 mmol/L   Glucose, Bld 143 (H) 65 - 99 mg/dL   BUN 25 (H) 6 - 20 mg/dL   Creatinine, Ser 1.612.00 (H) 0.61 - 1.24 mg/dL   Calcium 8.4 (L) 8.9 - 10.3 mg/dL   GFR calc non Af Amer 33  (L) >60 mL/min   GFR calc Af Amer 39 (L) >60 mL/min   Anion gap 12 5 - 15  Magnesium  Result Value Ref Range   Magnesium 1.6 (L) 1.7 - 2.4 mg/dL  Phosphorus  Result Value Ref Range   Phosphorus 2.9 2.5 - 4.6 mg/dL  CBC  Result Value Ref Range   WBC 11.3 (H) 4.0 - 10.5 K/uL   RBC 3.71 (L) 4.22 - 5.81 MIL/uL   Hemoglobin 9.9 (L) 13.0 - 17.0 g/dL   HCT 09.630.4 (L) 04.539.0 - 40.952.0 %   MCV 81.9 78.0 - 100.0 fL   MCH 26.7 26.0 - 34.0 pg   MCHC 32.6 30.0 - 36.0 g/dL   RDW 81.114.6 91.411.5 - 78.215.5 %   Platelets 177 150 - 400 K/uL  Glucose, capillary  Result Value Ref Range   Glucose-Capillary 89 65 - 99 mg/dL   Comment 1 Capillary Specimen   Glucose, capillary  Result Value Ref Range   Glucose-Capillary 132 (H) 65 - 99 mg/dL   Comment 1 Capillary Specimen    Comment 2 Notify RN   Glucose, capillary  Result Value Ref Range   Glucose-Capillary 106 (H) 65 - 99 mg/dL   Comment 1 Capillary Specimen    Comment 2 Notify RN   Glucose, capillary  Result Value Ref Range   Glucose-Capillary 110 (H) 65 - 99 mg/dL   Comment 1 Notify RN   Glucose, capillary  Result Value Ref Range   Glucose-Capillary 168 (H) 65 - 99 mg/dL   Comment 1 Capillary Specimen    Comment 2 Notify RN   Glucose, capillary  Result Value Ref Range   Glucose-Capillary 221 (H) 65 - 99 mg/dL   Comment 1 Capillary Specimen    Comment 2 Notify RN   Glucose, capillary  Result Value Ref Range   Glucose-Capillary 67 65 - 99 mg/dL   Comment 1 Capillary  Specimen    Comment 2 Notify RN   Basic metabolic panel  Result Value Ref Range   Sodium 137 135 - 145 mmol/L   Potassium 3.4 (L) 3.5 - 5.1 mmol/L   Chloride 103 101 - 111 mmol/L   CO2 22 22 - 32 mmol/L   Glucose, Bld 108 (H) 65 - 99 mg/dL   BUN 21 (H) 6 - 20 mg/dL   Creatinine, Ser 6.96 (H) 0.61 - 1.24 mg/dL   Calcium 8.3 (L) 8.9 - 10.3 mg/dL   GFR calc non Af Amer 45 (L) >60 mL/min   GFR calc Af Amer 52 (L) >60 mL/min   Anion gap 12 5 - 15  Magnesium  Result Value Ref Range    Magnesium 1.7 1.7 - 2.4 mg/dL  Phosphorus  Result Value Ref Range   Phosphorus 4.4 2.5 - 4.6 mg/dL  CBC  Result Value Ref Range   WBC 11.3 (H) 4.0 - 10.5 K/uL   RBC 3.95 (L) 4.22 - 5.81 MIL/uL   Hemoglobin 10.8 (L) 13.0 - 17.0 g/dL   HCT 29.5 (L) 28.4 - 13.2 %   MCV 82.0 78.0 - 100.0 fL   MCH 27.3 26.0 - 34.0 pg   MCHC 33.3 30.0 - 36.0 g/dL   RDW 44.0 10.2 - 72.5 %   Platelets 188 150 - 400 K/uL  Glucose, capillary  Result Value Ref Range   Glucose-Capillary 74 65 - 99 mg/dL   Comment 1 Capillary Specimen   Glucose, capillary  Result Value Ref Range   Glucose-Capillary 149 (H) 65 - 99 mg/dL   Comment 1 Capillary Specimen    Comment 2 Notify RN   Glucose, capillary  Result Value Ref Range   Glucose-Capillary 97 65 - 99 mg/dL   Comment 1 Capillary Specimen    Comment 2 Notify RN   Glucose, capillary  Result Value Ref Range   Glucose-Capillary 110 (H) 65 - 99 mg/dL  Glucose, capillary  Result Value Ref Range   Glucose-Capillary 119 (H) 65 - 99 mg/dL   Comment 1 Capillary Specimen    Comment 2 Notify RN   Glucose, capillary  Result Value Ref Range   Glucose-Capillary 303 (H) 65 - 99 mg/dL   Comment 1 Capillary Specimen    Comment 2 Notify RN   Glucose, capillary  Result Value Ref Range   Glucose-Capillary 255 (H) 65 - 99 mg/dL   Comment 1 Capillary Specimen    Comment 2 Notify RN   Magnesium in AM  Result Value Ref Range   Magnesium 1.9 1.7 - 2.4 mg/dL  Basic metabolic panel  Result Value Ref Range   Sodium 138 135 - 145 mmol/L   Potassium 3.6 3.5 - 5.1 mmol/L   Chloride 104 101 - 111 mmol/L   CO2 23 22 - 32 mmol/L   Glucose, Bld 170 (H) 65 - 99 mg/dL   BUN 19 6 - 20 mg/dL   Creatinine, Ser 3.66 (H) 0.61 - 1.24 mg/dL   Calcium 8.4 (L) 8.9 - 10.3 mg/dL   GFR calc non Af Amer 45 (L) >60 mL/min   GFR calc Af Amer 52 (L) >60 mL/min   Anion gap 11 5 - 15  CBC  Result Value Ref Range   WBC 10.0 4.0 - 10.5 K/uL   RBC 3.65 (L) 4.22 - 5.81 MIL/uL   Hemoglobin  9.8 (L) 13.0 - 17.0 g/dL   HCT 44.0 (L) 34.7 - 42.5 %   MCV 82.7 78.0 - 100.0  fL   MCH 26.8 26.0 - 34.0 pg   MCHC 32.5 30.0 - 36.0 g/dL   RDW 16.1 09.6 - 04.5 %   Platelets 185 150 - 400 K/uL  Glucose, capillary  Result Value Ref Range   Glucose-Capillary 280 (H) 65 - 99 mg/dL  Glucose, capillary  Result Value Ref Range   Glucose-Capillary 236 (H) 65 - 99 mg/dL   Comment 1 Capillary Specimen    Comment 2 Notify RN   Glucose, capillary  Result Value Ref Range   Glucose-Capillary 221 (H) 65 - 99 mg/dL   Comment 1 Capillary Specimen    Comment 2 Notify RN   Glucose, capillary  Result Value Ref Range   Glucose-Capillary 181 (H) 65 - 99 mg/dL   Comment 1 Capillary Specimen    Comment 2 Notify RN   Glucose, capillary  Result Value Ref Range   Glucose-Capillary 152 (H) 65 - 99 mg/dL   Comment 1 Capillary Specimen    Comment 2 Notify RN   Glucose, capillary  Result Value Ref Range   Glucose-Capillary 156 (H) 65 - 99 mg/dL  Glucose, capillary  Result Value Ref Range   Glucose-Capillary 203 (H) 65 - 99 mg/dL  Basic metabolic panel  Result Value Ref Range   Sodium 141 135 - 145 mmol/L   Potassium 3.9 3.5 - 5.1 mmol/L   Chloride 106 101 - 111 mmol/L   CO2 25 22 - 32 mmol/L   Glucose, Bld 150 (H) 65 - 99 mg/dL   BUN 17 6 - 20 mg/dL   Creatinine, Ser 4.09 (H) 0.61 - 1.24 mg/dL   Calcium 8.6 (L) 8.9 - 10.3 mg/dL   GFR calc non Af Amer 51 (L) >60 mL/min   GFR calc Af Amer 59 (L) >60 mL/min   Anion gap 10 5 - 15  Glucose, capillary  Result Value Ref Range   Glucose-Capillary 191 (H) 65 - 99 mg/dL  Glucose, capillary  Result Value Ref Range   Glucose-Capillary 151 (H) 65 - 99 mg/dL  Glucose, capillary  Result Value Ref Range   Glucose-Capillary 224 (H) 65 - 99 mg/dL  Glucose, capillary  Result Value Ref Range   Glucose-Capillary 196 (H) 65 - 99 mg/dL  Basic metabolic panel  Result Value Ref Range   Sodium 141 135 - 145 mmol/L   Potassium 4.0 3.5 - 5.1 mmol/L   Chloride  110 101 - 111 mmol/L   CO2 22 22 - 32 mmol/L   Glucose, Bld 165 (H) 65 - 99 mg/dL   BUN 14 6 - 20 mg/dL   Creatinine, Ser 8.11 0.61 - 1.24 mg/dL   Calcium 8.8 (L) 8.9 - 10.3 mg/dL   GFR calc non Af Amer >60 >60 mL/min   GFR calc Af Amer >60 >60 mL/min   Anion gap 9 5 - 15  Glucose, capillary  Result Value Ref Range   Glucose-Capillary 247 (H) 65 - 99 mg/dL  Glucose, capillary  Result Value Ref Range   Glucose-Capillary 159 (H) 65 - 99 mg/dL  Glucose, capillary  Result Value Ref Range   Glucose-Capillary 210 (H) 65 - 99 mg/dL  Glucose, capillary  Result Value Ref Range   Glucose-Capillary 137 (H) 65 - 99 mg/dL  Glucose, capillary  Result Value Ref Range   Glucose-Capillary 127 (H) 65 - 99 mg/dL  Glucose, capillary  Result Value Ref Range   Glucose-Capillary 131 (H) 65 - 99 mg/dL  Glucose, capillary  Result Value Ref Range  Glucose-Capillary 224 (H) 65 - 99 mg/dL  I-Stat Troponin, ED (not at [facility])  Result Value Ref Range   Troponin i, poc 0.09 (HH) 0.00 - 0.08 ng/mL   Comment NOTIFIED PHYSICIAN    Comment 3          I-Stat CG4 Lactic Acid, ED  Result Value Ref Range   Lactic Acid, Venous 7.82 (HH) 0.5 - 1.9 mmol/L   Comment NOTIFIED PHYSICIAN   I-Stat Chem 8, ED  Result Value Ref Range   Sodium 138 135 - 145 mmol/L   Potassium 5.7 (H) 3.5 - 5.1 mmol/L   Chloride 110 101 - 111 mmol/L   BUN 29 (H) 6 - 20 mg/dL   Creatinine, Ser 1.61 (H) 0.61 - 1.24 mg/dL   Glucose, Bld 096 (H) 65 - 99 mg/dL   Calcium, Ion 0.45 (L) 1.15 - 1.40 mmol/L   TCO2 16 (L) 22 - 32 mmol/L   Hemoglobin 11.9 (L) 13.0 - 17.0 g/dL   HCT 40.9 (L) 81.1 - 91.4 %  I-Stat Arterial Blood Gas, ED - (order at [facility] and [facility] only)  Result Value Ref Range   pH, Arterial 7.314 (L) 7.350 - 7.450   pCO2 arterial 24.2 (L) 32.0 - 48.0 mmHg   pO2, Arterial 67.0 (L) 83.0 - 108.0 mmHg   Bicarbonate 12.4 (L) 20.0 - 28.0 mmol/L   TCO2 13 (L) 22 - 32 mmol/L   O2 Saturation 93.0 %   Acid-base deficit 12.0 (H) 0.0 - 2.0  mmol/L   Patient temperature 36.4 C    Collection site RADIAL, ALLEN'S TEST ACCEPTABLE    Drawn by Operator    Sample type ARTERIAL   CBG monitoring, ED  Result Value Ref Range   Glucose-Capillary 234 (H) 65 - 99 mg/dL   Comment 1 Notify RN    Comment 2 Document in Chart   I-Stat CG4 Lactic Acid, ED  Result Value Ref Range   Lactic Acid, Venous 4.72 (HH) 0.5 - 1.9 mmol/L   Comment NOTIFIED PHYSICIAN   I-STAT 3, arterial blood gas (G3+)  Result Value Ref Range   pH, Arterial 7.398 7.350 - 7.450   pCO2 arterial 30.4 (L) 32.0 - 48.0 mmHg   pO2, Arterial 277.0 (H) 83.0 - 108.0 mmHg   Bicarbonate 18.8 (L) 20.0 - 28.0 mmol/L   TCO2 20 (L) 22 - 32 mmol/L   O2 Saturation 100.0 %   Acid-base deficit 5.0 (H) 0.0 - 2.0 mmol/L   Patient temperature HIDE    Collection site BRACHIAL ARTERY    Sample type ARTERIAL   I-STAT 3, arterial blood gas (G3+)  Result Value Ref Range   pH, Arterial 7.492 (H) 7.350 - 7.450   pCO2 arterial 24.5 (L) 32.0 - 48.0 mmHg   pO2, Arterial 96.0 83.0 - 108.0 mmHg   Bicarbonate 18.6 (L) 20.0 - 28.0 mmol/L   TCO2 19 (L) 22 - 32 mmol/L   O2 Saturation 98.0 %   Acid-base deficit 3.0 (H) 0.0 - 2.0 mmol/L   Patient temperature 100.2 F    Collection site BRACHIAL ARTERY    Drawn by RT    Sample type ARTERIAL     Labs Reviewed - No data to display  No results found.     ASSESSMENT & PLAN:  1. Gangrene (HCC)     Meds ordered this encounter  Medications  . mupirocin ointment (BACTROBAN) 2 %    Sig: Apply 1 application topically 3 (three) times daily.    Dispense:  30 g    Refill:  3    Reviewed expectations re: course of current medical issues. Questions answered. Outlined signs and symptoms indicating need for more acute intervention. Patient verbalized understanding. After Visit Summary given.    Procedures:      Elvina Sidle, MD 10/22/17 [phone]

## 2017-10-22 NOTE — ED Notes (Signed)
No feeling in tips of toes, black, shriveled.  Unable to palpate pedal pulses with any certainty and patient says "they use a machine".

## 2017-10-29 ENCOUNTER — Encounter (INDEPENDENT_AMBULATORY_CARE_PROVIDER_SITE_OTHER): Payer: Self-pay | Admitting: Orthopedic Surgery

## 2017-10-29 ENCOUNTER — Ambulatory Visit (INDEPENDENT_AMBULATORY_CARE_PROVIDER_SITE_OTHER): Payer: Medicare HMO

## 2017-10-29 ENCOUNTER — Ambulatory Visit (INDEPENDENT_AMBULATORY_CARE_PROVIDER_SITE_OTHER): Payer: Medicare HMO | Admitting: Orthopedic Surgery

## 2017-10-29 ENCOUNTER — Telehealth (INDEPENDENT_AMBULATORY_CARE_PROVIDER_SITE_OTHER): Payer: Self-pay | Admitting: Orthopedic Surgery

## 2017-10-29 VITALS — Ht 73.0 in | Wt 158.0 lb

## 2017-10-29 DIAGNOSIS — I96 Gangrene, not elsewhere classified: Secondary | ICD-10-CM | POA: Diagnosis not present

## 2017-10-29 DIAGNOSIS — M79671 Pain in right foot: Secondary | ICD-10-CM | POA: Diagnosis not present

## 2017-10-29 DIAGNOSIS — M79672 Pain in left foot: Secondary | ICD-10-CM

## 2017-10-29 NOTE — Progress Notes (Signed)
Office Visit Note   Patient: Jordan Gardner           Date of Birth: 09/19/1950           MRN: [id number] Visit Date: 10/29/2017              Requested by: Jeanine Luzlinic, [facility] [address] [facility] [address] [place], [place] [id number] [facility]  Chief Complaint  Patient presents with  . Right Foot - Pain    GT and second toe gangrene  . Left Foot - Pain    2nd and 3rd toe gangrene       HPI: Patient is a 67 year old gentleman with diabetes with severe peripheral vascular disease.  He states that he did see Dr. Durwin Noraixon several months ago he was scheduled for further procedures but he failed to follow-up.  Patient was seen earlier this month in the emergency room and was referred here for evaluation and treatment.  Patient complains of several month history of gangrenous changes to his toes.  Patient is a smoker he states his most recent hemoglobin A1c was 7.1.  Assessment & Plan: Visit Diagnoses:  1. Gangrene of foot (HCC)   2. Pain in right foot   3. Pain in left foot     Plan: Patient needs to complete his vascular workup.  With lack of pulses at the ankle patient does not have any foot salvage options at this time.  Follow-Up Instructions: Return if symptoms worsen or fail to improve.   Ortho Exam  Patient is alert, oriented, no adenopathy, well-dressed, normal affect, normal respiratory effort. Examination patient has an antalgic gait.  He does not have a palpable dorsalis pedis or posterior tibial pulse  A Doppler was used and patient has a dampened monophasic posterior tibial pulse bilaterally but I cannot Doppler a dorsalis pedis pulse bilaterally.  Patient has dry gangrenous changes of the toes of both feet with Waggoner grade 4 ulceration.  There is no ascending cellulitis no purulent drainage he does have ischemic pain.  Imaging: Xr Foot 2 Views Left  Result Date: 10/29/2017 2 view radiographs of the left foot shows no destructive bony  changes.  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  There is no destructive bony changes.  No images are attached to the encounter.  Labs: Lab Results  Component Value Date   HGBA1C 7.1 (H) 07/28/2017   HGBA1C 7.7 (H) 06/26/2017   HGBA1C 7.4 (H) 04/25/2017   REPTSTATUS 07/31/2017 FINAL 07/28/2017   GRAMSTAIN  07/28/2017    ABUNDANT WBC PRESENT,BOTH PMN AND MONONUCLEAR NO ORGANISMS SEEN    CULT Consistent with normal respiratory flora. 07/28/2017    @LABSALLVALUES (HGBA1)@  Body mass index is 20.85 kg/m.  Orders:  Orders Placed This Encounter  Procedures  . XR Foot 2 Views Right  . XR Foot 2 Views Left  . Ambulatory referral to Vascular Surgery   No orders of the defined types were placed in this encounter.    Procedures: No procedures performed  Clinical Data: No additional findings.  ROS:  All other systems negative, except as noted in the HPI. Review of Systems  Objective: Vital Signs: Ht 6\' 1"  (1.854 m)   Wt 158 lb (71.7 kg)   BMI 20.85 kg/m   Specialty Comments:  No specialty comments available.  PMFS History: Patient Active Problem List   Diagnosis Date Noted  .  Gangrene of foot (HCC) 10/29/2017  . Acute hypoxemic respiratory failure (HCC) 07/28/2017  . 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 Critical lower limb ischemia   . 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 AKI (acute kidney injury) (HCC) 04/28/2017  . Malnutrition of moderate degree 04/28/2017  . Pressure injury of skin 04/27/2017  . Protein calorie malnutrition (HCC) 04/27/2017  . Physical deconditioning 04/27/2017  . Ulcer of great toe (HCC) 04/27/2017  . Claudication (HCC) 04/27/2017  . Sacral decubitus ulcer  04/27/2017  . Accelerated hypertension   . Hyperlipidemia   . Tobacco abuse disorder   . Altered mental status   . PAD (peripheral artery disease) (HCC)   . NSTEMI (non-ST elevated myocardial infarction) (HCC) 04/25/2017  . NECK PAIN 10/16/2008  . BACK PAIN 10/16/2008   Past Medical History:  Diagnosis Date  . Anxiety   . Arthritis   . Asthma   . Coronary artery disease   . Depression   . Diabetes mellitus without complication (HCC)    type 2  . Headache   . Hyperlipidemia   . Hypertension   . Myocardial infarction (HCC)   . Neuromuscular disorder (HCC)   . PAD (peripheral artery disease) (HCC)   . Protein calorie malnutrition (HCC) 04/2017  . Psychotic affective disorder (HCC)    Per sister. Unsure of Dx. Pt sleeps late and difficult to wake. odd affect  . Sacral decubitus ulcer 04/2017  . Stroke [facility])    left side weakness    Family History  Problem Relation Age of Onset  . Hypertension Sister     Past Surgical History:  Procedure Laterality Date  . ABDOMINAL AORTOGRAM W/LOWER EXTREMITY N/A 06/08/2017   Procedure: ABDOMINAL AORTOGRAM W/LOWER EXTREMITY;  Surgeon: Chuck Hint, MD;  Location: [facility] INVASIVE CV LAB;  Service: Cardiovascular;  Laterality: N/A;  . BACK SURGERY    . CARDIAC CATHETERIZATION    . CARDIAC SURGERY     CABG x5 2012 - [facility]  . CAROTID-SUBCLAVIAN BYPASS GRAFT Left 07/23/2017   Procedure: BYPASS SUBCLAVIAN ARTERY  USING VIABAHN GORE STENT GRAFT;  Surgeon: Chuck Hint, MD;  Location: [facility] OR;  Service: Vascular;  Laterality: Left;  . LEFT HEART CATH AND CORS/GRAFTS ANGIOGRAPHY N/A 06/26/2017   Procedure: LEFT HEART CATH AND CORS/GRAFTS ANGIOGRAPHY;  Surgeon: Tonny Bollman, MD;  Location: [facility] INVASIVE CV LAB;  Service: Cardiovascular;  Laterality: N/A;   Social History   Occupational History  . Not on file  Tobacco Use  . Smoking status: Current Every Day Smoker    Packs/day: 1.00    Types: Cigarettes  . Smokeless tobacco:  Never Used  Substance and Sexual Activity  . Alcohol use: No  . Drug use: No  . Sexual activity: Not on file

## 2017-10-29 NOTE — Telephone Encounter (Signed)
Tried to csll no answer no message available. We did not give rx for pt today.

## 2017-10-29 NOTE — Telephone Encounter (Signed)
Patient's sister stated that the prescriptions need to [facility].  Thank you.

## 2017-11-25 ENCOUNTER — Other Ambulatory Visit: Payer: Self-pay

## 2017-11-25 ENCOUNTER — Ambulatory Visit (INDEPENDENT_AMBULATORY_CARE_PROVIDER_SITE_OTHER): Payer: Medicare HMO | Admitting: Vascular Surgery

## 2017-11-25 ENCOUNTER — Encounter: Payer: Self-pay | Admitting: Vascular Surgery

## 2017-11-25 VITALS — BP 155/83 | HR 85 | Temp 96.9°F | Resp 21 | Ht 72.25 in | Wt 163.9 lb

## 2017-11-25 DIAGNOSIS — I70263 Atherosclerosis of native arteries of extremities with gangrene, bilateral legs: Secondary | ICD-10-CM | POA: Diagnosis not present

## 2017-11-25 NOTE — Progress Notes (Signed)
Patient name: Jordan Gardner MRN: [id number] DOB: 02/05/1951 Sex: male  REASON FOR VISIT:   Gangrene of toes of both feet.  The consult is requested by Dr. Lajoyce Cornersuda.  HPI:   Jordan Gardner is a pleasant 67 y.o. male who I originally saw in consultation on 05/27/2017 with bilateral lower extremity critical limb ischemia.  The patient has a complicated history.  His legs have been hurting for about a year.  He has been smoking since he was 67 years old and had smoked up to 2 packs/day although he is currently cut back to 1 pack/day.  CT angiogram had been done at that time which showed multilevel arterial occlusive disease.  I recommended that we proceed with arteriography as he had nonhealing .  The vein had been taken from the right leg for previous coronary revascularization.  He underwent arteriogram the results of which are described below.  He was not a candidate for endovascular approach and therefore I felt he would need cardiac clearance before considering bypass although his options were limited.   He has a history of significant coronary artery disease he has had a previous non-ST elevation MI.  He tells me that he had chest pain as early as last week.  He is undergone previous coronary revascular[place] in [place].  He had previously was read refused cardiac catheterization.  He subsequently presented with chest pain and underwent cardiac catheterization was found to have a tight left subclavian artery stenosis proximal to a LIMA graft.  On 07/23/2017 he underwent exposure of the left brachial artery with left subclavian arteriogram and angioplasty and stenting of a left subclavian artery stenosis with a VBX covered stent.  He did well from that standpoint.  He also has mild chronic kidney disease.  He was seen by Dr. Lajoyce Cornersuda with gangrenous changes on the toes on both feet and was sent for vascular consultation.  The patient is a poor historian.  His activity is very limited and he really does  not walk much except with assistance and with a cane.  I do not get any clear-cut history of rest pain in his feet.  Past Medical History:  Diagnosis Date  . Anxiety   . Arthritis   . Asthma   . Coronary artery disease   . Depression   . Diabetes mellitus without complication (HCC)    type 2  . Headache   . Hyperlipidemia   . Hypertension   . Myocardial infarction (HCC)   . Neuromuscular disorder (HCC)   . PAD (peripheral artery disease) (HCC)   . Protein calorie malnutrition (HCC) 04/2017  . Psychotic affective disorder (HCC)    Per sister. Unsure of Dx. Pt sleeps late and difficult to wake. odd affect  . Sacral decubitus ulcer 04/2017  . Stroke [facility](HCC)    left side weakness    Family History  Problem Relation Age of Onset  . Hypertension Sister     SOCIAL HISTORY: Social History   Tobacco Use  . Smoking status: Current Every Day Smoker    Packs/day: 1.00    Types: Cigarettes  . Smokeless tobacco: Never Used  Substance Use Topics  . Alcohol use: No    No Known Allergies  Current Outpatient Medications  Medication Sig Dispense Refill  . aspirin EC 81 MG EC tablet Take 1 tablet (81 mg total) by mouth daily.    Marland Kitchen. aspirin-acetaminophen-caffeine (EXCEDRIN MIGRAINE) 250-250-65 MG tablet Take 2 tablets by mouth every 8 (eight) hours as needed for  headache.    Marland Kitchen atorvastatin (LIPITOR) 40 MG tablet Take 80 mg by mouth at bedtime.    . feeding supplement, GLUCERNA SHAKE, (GLUCERNA SHAKE) LIQD Take 237 mLs by mouth 2 (two) times daily between meals.  0  . furosemide (LASIX) 40 MG tablet Take 1 tablet (40 mg total) by mouth daily.    Marland Kitchen gabapentin (NEURONTIN) 100 MG capsule Take 1 capsule (100 mg total) by mouth 2 (two) times daily.    . isosorbide mononitrate (IMDUR) 30 MG 24 hr tablet Take 2 tablets (60 mg total) daily by mouth.    Marland Kitchen lisinopril (PRINIVIL,ZESTRIL) 2.5 MG tablet Take 1 tablet (2.5 mg total) daily by mouth. 30 tablet 1  . metFORMIN (GLUCOPHAGE) 500 MG tablet  Take 1,000 mg by mouth 2 (two) times daily with a meal.    . metoprolol tartrate (LOPRESSOR) 50 MG tablet Take 1 tablet (50 mg total) 2 (two) times daily by mouth. 60 tablet 1  . mirtazapine (REMERON) 15 MG tablet Take 15 mg by mouth at bedtime.    . Multiple Vitamin (MULTIVITAMIN WITH MINERALS) TABS tablet Take 1 tablet by mouth daily.    . mupirocin ointment (BACTROBAN) 2 % Apply 1 application topically 3 (three) times daily. 30 g 3  . nicotine (NICODERM CQ - DOSED IN MG/24 HOURS) 14 mg/24hr patch Place 1 patch (14 mg total) onto the skin daily. 28 patch 0  . nitroGLYCERIN (NITROSTAT) 0.4 MG SL tablet Place 1 tablet (0.4 mg total) under the tongue every 5 (five) minutes x 3 doses as needed for chest pain.  12  . oxyCODONE (OXY IR/ROXICODONE) 5 MG immediate release tablet Take 1 tablet (5 mg total) by mouth every 6 (six) hours as needed for moderate pain. 20 tablet 0  . sertraline (ZOLOFT) 100 MG tablet Take 100 mg by mouth daily. DEPRESSION/ANXIETY     No current facility-administered medications for this visit.     REVIEW OF SYSTEMS:  [X]  denotes positive finding, [ ]  denotes negative finding Cardiac  Comments:  Chest pain or chest pressure: x   Shortness of breath upon exertion: x   Short of breath when lying flat:    Irregular heart rhythm:        Vascular    Pain in calf, thigh, or hip brought on by ambulation: x   Pain in feet at night that wakes you up from your sleep:     Blood clot in your veins:    Leg swelling:         Pulmonary    Oxygen at home:    Productive cough:     Wheezing:         Neurologic    Sudden weakness in arms or legs:     Sudden numbness in arms or legs:     Sudden onset of difficulty speaking or slurred speech:    Temporary loss of vision in one eye:     Problems with dizziness:         Gastrointestinal    Blood in stool:     Vomited blood:         Genitourinary    Burning when urinating:     Blood in urine:        Psychiatric    Major  depression:         Hematologic    Bleeding problems:    Problems with blood clotting too easily:        Skin  Rashes or ulcers:        Constitutional    Fever or chills:     PHYSICAL EXAM:   Vitals:   11/25/17 1541 11/25/17 1544  BP: (!) 150/81 (!) 155/83  Pulse: 85   Resp: (!) 21   Temp: (!) 96.9 F (36.1 C)   TempSrc: Oral   SpO2: 96%   Weight: 163 lb 14.4 oz (74.3 kg)   Height: 6' 0.25" (1.835 m)     GENERAL: The patient is a  poorly-nourished male, in no acute distress. The vital signs are documented above. CARDIAC: There is a regular rate and rhythm.  VASCULAR: I do not detect carotid bruits. He has a weak femoral pulse on the right that is felt fairly high given that he has a bulky plaque in his right common femoral artery.  I cannot palpate distal pulses on the right. On the left, he does have a reasonable femoral pulse. He has no significant lower extremity swelling. PULMONARY: There is good air exchange bilaterally without wheezing or rales. ABDOMEN: Soft and non-tender with normal pitched bowel sounds.  MUSCULOSKELETAL: There are no major deformities or cyanosis. NEUROLOGIC: No focal weakness or paresthesias are detected. SKIN: He does have a small sacral decubitus.  He has gangrenous changes on the first second and third toes of the right foot.  He has gangrenous changes on all of the toes of the left foot. PSYCHIATRIC: The patient has a normal affect.  DATA:    ARTERIOGRAM: I have reviewed his arteriogram that was done on 06/08/2017.  He has a moderate distal aortic stenosis and also significant iliac disease bilaterally which is more significant on the right side.  He has bilateral superficial femoral artery occlusions  MEDICAL ISSUES:   CRITICAL LIMB ISCHEMIA: This patient has critical limb ischemia bilaterally with multilevel arterial occlusive disease and gangrenous changes on the toes of both feet.  He is at high risk for surgery given his severe  protein calorie malnutrition, debilitated state, and significant cardiac disease.  He will need preoperative cardiac evaluation given his extensive history and we will arrange this.  After this is complete the options would be bilateral primary above-the-knee amputations which would likely be the safest approach, versus attempt at revascularization.  He is not a candidate for endovascular approach.  On the left side despite the proximal disease he does have a reasonable femoral pulse and he also appears to have a vein in the left leg although he will need a formal vein mapping.  He could potentially have a left femoral to below-knee popliteal artery bypass with transmetatarsal amputation to try to salvage the left leg.  This would be associated with significant risk given his malnourished state and cardiac history.  On the right side the situation is not as optimistic given that he has a markedly diminished femoral pulse on the right and would likely require an axillofemoral bypass in addition to a femoropopliteal bypass which would have to be done with a prosthetic graft given that his vein has been taken from the right leg for CABG.  We would have to see how he did after left leg bypass if this were considered before even considering the right side.  This is obviously a very complicated situation.  I will discuss this further with him after his preoperative cardiac evaluation.  HYPERTENSION: The patient's initial blood pressure today was elevated. We repeated this and this was still elevated. We have encouraged the patient to follow up with  their primary care physician for management of their blood pressure.   Waverly Ferrari [facility] [phone]

## 2017-11-27 ENCOUNTER — Other Ambulatory Visit: Payer: Self-pay

## 2017-11-27 ENCOUNTER — Telehealth: Payer: Self-pay | Admitting: Physician Assistant

## 2017-11-27 DIAGNOSIS — Z01812 Encounter for preprocedural laboratory examination: Secondary | ICD-10-CM

## 2017-11-27 NOTE — Telephone Encounter (Signed)
NEW MESSAGE       [facility] Pre-operative Risk Assessment    Request for surgical clearance:  1. What type of surgery is being performed? Femoral popliteal bypass  2. When is this surgery scheduled? TBD  3. What type of clearance is required (medical clearance vs. Pharmacy clearance to hold med vs. Both)? both  4. Are there any medications that need to be held prior to surgery and how long?n/a  5. Practice name and name of physician performing surgery? Dr Kathlee Nations, [facility]  6. What is your office phone number [phone]   7.   What is your office fax number [phone]  8.   Anesthesia type (None, local, MAC, general) ? [place] 11/27/2017, 4:05 PM  _________________________________________________________________   (provider comments below)

## 2017-12-01 NOTE — Telephone Encounter (Signed)
   Patient admitted 06/2017 with a non-STEMI, medical therapy for significant coronary artery disease.  He has not been seen in our office since that admission, [facility].  Femoropopliteal bypass surgery needed, cardiac clearance requested.  Attempted to contact the patient by phone, only one member available and I was unable to leave a message.  Theodore DemarkRhonda Barrett, PA-C 12/01/2017 3:23 PM [facility] [phone]

## 2017-12-02 NOTE — Telephone Encounter (Signed)
Spoke with Patient's sister "Athena Masseina Ford". The patient and family unsure about cardi[facility]. He has appointment with me 5/7 for surgical clearance. The patient or family member will call us back and let us know that where the patient will follow up for clearance. Will address clearance if he comes for follow up next month.   I will route this recommendation to the requesting party via Epic fax function and remove from pre-op pool.  Please call with questions.  CloverdaleBhavinkumar Bailea Beed, [place] 12/02/2017, 1:55 PM

## 2017-12-08 ENCOUNTER — Encounter: Payer: Self-pay | Admitting: Physician Assistant

## 2017-12-16 ENCOUNTER — Other Ambulatory Visit: Payer: Self-pay

## 2017-12-16 DIAGNOSIS — I739 Peripheral vascular disease, unspecified: Secondary | ICD-10-CM

## 2017-12-16 DIAGNOSIS — L97909 Non-pressure chronic ulcer of unspecified part of unspecified lower leg with unspecified severity: Secondary | ICD-10-CM

## 2017-12-22 ENCOUNTER — Encounter: Payer: Self-pay | Admitting: Physician Assistant

## 2017-12-22 ENCOUNTER — Ambulatory Visit (INDEPENDENT_AMBULATORY_CARE_PROVIDER_SITE_OTHER): Payer: Medicare HMO | Admitting: Physician Assistant

## 2017-12-22 VITALS — BP 124/66 | HR 98 | Ht 72.25 in | Wt 155.2 lb

## 2017-12-22 DIAGNOSIS — I708 Atherosclerosis of other arteries: Secondary | ICD-10-CM | POA: Diagnosis not present

## 2017-12-22 DIAGNOSIS — I25700 Atherosclerosis of coronary artery bypass graft(s), unspecified, with unstable angina pectoris: Secondary | ICD-10-CM | POA: Diagnosis not present

## 2017-12-22 DIAGNOSIS — E785 Hyperlipidemia, unspecified: Secondary | ICD-10-CM | POA: Diagnosis not present

## 2017-12-22 DIAGNOSIS — Z01818 Encounter for other preprocedural examination: Secondary | ICD-10-CM

## 2017-12-22 DIAGNOSIS — I739 Peripheral vascular disease, unspecified: Secondary | ICD-10-CM

## 2017-12-22 DIAGNOSIS — I998 Other disorder of circulatory system: Secondary | ICD-10-CM

## 2017-12-22 DIAGNOSIS — I70229 Atherosclerosis of native arteries of extremities with rest pain, unspecified extremity: Secondary | ICD-10-CM

## 2017-12-22 NOTE — Patient Instructions (Addendum)
Medication Instructions:  Your physician recommends that you continue on your current medications as directed. Please refer to the Current Medication list given to you today.   Labwork: None Ordered   Testing/Procedures: None Ordered   Follow-Up: Your physician recommends that you schedule a follow-up appointment in: as needed with Dr. Tresa Endo    If you need a refill on your cardiac medications before your next appointment, please call your pharmacy.   Thank you for choosing [facility]! Eligha Bridegroom, RN [phone]

## 2017-12-22 NOTE — Progress Notes (Signed)
Cardiology Office Note    Date:  12/22/2017   ID:  Jordan Gardner, DOB [birth year], MRN [id number]  PCP:  [facility]  [facility]  [facility]  Chief Complaint: surgical clearance for femoral popliteal bypass  History of Present Illness:   Jordan Gardner is a 67 y.o. male with hx of CAD s/p CABG at [facility] in 2012, Left subclavian artery stenosis s/p stenting, PVD followed by Dr. Edilia Bo, + tobacco 2ppd, DM-2, HLD and disability walks with walker presents for surgical clearance.   Admitted 04/25/17 with NSTEMI Pk troponin 0.72EKG with chronic RBBB and LPFB. He refused cath and would only do for refractory chest pain. He also had episode of unresponsiveness that admit. EF on Echo was 60-65% G1 DD. Trivial aortic regurg.   06/08/17 pt admitted with progressive ischemia of both lower ext. 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 There was diffuse aortoiliac occlusive disease and also bilateral hypogastric artery occlusions.Rt and Lt with diffuse disease with rest pain and wound on lt foot. Felt high risk for surgery.   Admitted 06/2017 for chest pain. Cardiac cath showed severe 3v disease with 1/4 patent grafts (LIMA to the LAD) with moderate LV dysfunction.The left subclavian has greater than 80% stenosis proximal to the LIMA to the LAD.He was not considered a candidate for acute intervention. It was also fel that he would be high risk for cardiac surgery. Plan was for medical therapy.  On 07/23/2017 he underwent exposure of the left brachial artery with left subclavian arteriogram and angioplasty and stenting of a left subclavian artery stenosis with a VBX covered stent.  He did well from that standpoint.  Seen by Dr. Lajoyce Corners and Dr. Edilia Bo recently for critial limb ischemia bilaterally with multilevel arterial occlusive disease and gangrenous changes on the toes of both feet. Plan for femoral popliteal bypass and here for  surgical clearance.   Here today for clearance. fragmented medical care between [facility] and local facility. Hasn't seen by [place] since discharge 06/2017. He denies any stenting to his coronaries last seen in hospital.  Mostly sedentary lifestyle.  Denies chest pain, orthopnea, PND, syncope or lower extremity edema.  He has chronic shortness of breath with minimal exertion.  Compliant with medications.  He only takes aspirin 81 mg.  Past Medical History:  Diagnosis Date  . Anxiety   . Arthritis   . Asthma   . Coronary artery disease   . Depression   . Diabetes mellitus without complication (HCC)    type 2  . Headache   . Hyperlipidemia   . Hypertension   . Myocardial infarction (HCC)   . Neuromuscular disorder (HCC)   . PAD (peripheral artery disease) (HCC)   . Protein calorie malnutrition (HCC) 04/2017  . Psychotic affective disorder (HCC)    Per sister. Unsure of Dx. Pt sleeps late and difficult to wake. odd affect  . Sacral decubitus ulcer 04/2017  . Stroke [facility])    left side weakness    Past Surgical History:  Procedure Laterality Date  . ABDOMINAL AORTOGRAM W/LOWER EXTREMITY N/A 06/08/2017   Procedure: ABDOMINAL AORTOGRAM W/LOWER EXTREMITY;  Surgeon: Chuck Hint, MD;  Location: [facility] INVASIVE CV LAB;  Service: Cardiovascular;  Laterality: N/A;  . BACK SURGERY    . CARDIAC CATHETERIZATION    . CARDIAC SURGERY     CABG x5 2012 - [facility]  . CAROTID-SUBCLAVIAN BYPASS GRAFT Left 07/23/2017   Procedure: BYPASS SUBCLAVIAN ARTERY  USING VIABAHN GORE STENT GRAFT;  Surgeon: Chuck Hint, MD;  Location: [facility] OR;  Service: Vascular;  Laterality: Left;  . LEFT HEART CATH AND CORS/GRAFTS ANGIOGRAPHY N/A 06/26/2017   Procedure: LEFT HEART CATH AND CORS/GRAFTS ANGIOGRAPHY;  Surgeon: Tonny Bollman, MD;  Location: [facility] INVASIVE CV LAB;  Service: Cardiovascular;  Laterality: N/A;    Current Medications: Prior to Admission medications   Medication Sig Start Date End Date Taking?  Authorizing Provider  aspirin EC 81 MG EC tablet Take 1 tablet (81 mg total) by mouth daily. 04/30/17   Cleora Fleet, MD  aspirin-acetaminophen-caffeine (EXCEDRIN MIGRAINE) [phone] MG tablet Take 2 tablets by mouth every 8 (eight) hours as needed for headache.    [provider]  atorvastatin (LIPITOR) 40 MG tablet Take 80 mg by mouth at bedtime.    [provider]  feeding supplement, GLUCERNA SHAKE, (GLUCERNA SHAKE) LIQD Take 237 mLs by mouth 2 (two) times daily between meals. 04/30/17   Johnson, Clanford L, MD  furosemide (LASIX) 40 MG tablet Take 1 tablet (40 mg total) by mouth daily. 08/05/17   Hongalgi, Maximino Greenland, MD  gabapentin (NEURONTIN) 100 MG capsule Take 1 capsule (100 mg total) by mouth 2 (two) times daily. 08/04/17   Elease Etienne, MD  isosorbide mononitrate (IMDUR) 30 MG 24 hr tablet Take 2 tablets (60 mg total) daily by mouth. 07/01/17   Arty Baumgartner, NP  lisinopril (PRINIVIL,ZESTRIL) 2.5 MG tablet Take 1 tablet (2.5 mg total) daily by mouth. 07/02/17   Arty Baumgartner, NP  metFORMIN (GLUCOPHAGE) 500 MG tablet Take 1,000 mg by mouth 2 (two) times daily with a meal.    [provider]  metoprolol tartrate (LOPRESSOR) 50 MG tablet Take 1 tablet (50 mg total) 2 (two) times daily by mouth. 07/01/17   Arty Baumgartner, NP  mirtazapine (REMERON) 15 MG tablet Take 15 mg by mouth at bedtime.    [provider]  Multiple Vitamin (MULTIVITAMIN WITH MINERALS) TABS tablet Take 1 tablet by mouth daily. 04/30/17   Johnson, Clanford L, MD  mupirocin ointment (BACTROBAN) 2 % Apply 1 application topically 3 (three) times daily. 10/22/17   Elvina Sidle, MD  nicotine (NICODERM CQ - DOSED IN MG/24 HOURS) 14 mg/24hr patch Place 1 patch (14 mg total) onto the skin daily. 04/30/17   Johnson, Clanford L, MD  nitroGLYCERIN (NITROSTAT) 0.4 MG SL tablet Place 1 tablet (0.4 mg total) under the tongue every 5 (five) minutes x 3 doses as needed for chest  pain. 04/29/17   Johnson, Clanford L, MD  oxyCODONE (OXY IR/ROXICODONE) 5 MG immediate release tablet Take 1 tablet (5 mg total) by mouth every 6 (six) hours as needed for moderate pain. 08/04/17   Hongalgi, Maximino Greenland, MD  sertraline (ZOLOFT) 100 MG tablet Take 100 mg by mouth daily. DEPRESSION/ANXIETY    [provider]    Allergies:   Patient has no known allergies.   Social History   Socioeconomic History  . Marital status: Single    Spouse name: Not on file  . Number of children: Not on file  . Years of education: Not on file  . Highest education level: Not on file  Occupational History  . Not on file  Social Needs  . Financial resource strain: Not on file  . Food insecurity:    Worry: Not on file    Inability: Not on file  . Transportation needs:    Medical: Not on file  Non-medical: Not on file  Tobacco Use  . Smoking status: Current Every Day Smoker    Packs/day: 1.00    Types: Cigarettes  . Smokeless tobacco: Never Used  Substance and Sexual Activity  . Alcohol use: No  . Drug use: No  . Sexual activity: Not on file  Lifestyle  . Physical activity:    Days per week: Not on file    Minutes per session: Not on file  . Stress: Not on file  Relationships  . Social connections:    Talks on phone: Not on file    Gets together: Not on file    Attends religious service: Not on file    Active member of club or organization: Not on file    Attends meetings of clubs or organizations: Not on file    Relationship status: Not on file  Other Topics Concern  . Not on file  Social History Narrative  . Not on file     Family History:  The patient's family history includes Hypertension in his sister.   ROS:   Please see the history of present illness.    ROS All other systems reviewed and are negative.   PHYSICAL EXAM:   VS:  BP 124/66   Pulse 98   Ht 6' 0.25" (1.835 m)   Wt 155 lb 3.2 oz (70.4 kg)   SpO2 95%   BMI 20.90 kg/m    GEN: Chronically ill  appearing male in no acute distress, walks with walker HEENT: normal  Neck: no JVD, carotid bruits, or masses Cardiac: RRR; no murmurs, rubs, or gallops,no edema  Respiratory:  clear to auscultation bilaterally, normal work of breathing, week left pedal pulse, no palpable pulse on R GI: soft, nontender, nondistended, + BS MS: no deformity or atrophy  Skin Gangrenous change in first and second toe right feet, all toes in left looks infected Neuro:  Alert and Oriented x 3 Psych: euthymic mood, full affect  Wt Readings from Last 3 Encounters:  12/22/17 155 lb 3.2 oz (70.4 kg)  11/25/17 163 lb 14.4 oz (74.3 kg)  10/29/17 158 lb (71.7 kg)      Studies/Labs Reviewed:   EKG:  EKG is not ordered today.  Sinus rhythm at rate of 83 bpm by EKG 07/28/2017.  Recent Labs: 06/26/2017: TSH 3.840 07/28/2017: ALT 91; B Natriuretic Peptide 3,888.5 08/01/2017: Hemoglobin 9.8; Magnesium 1.9; Platelets 185 08/03/2017: BUN 14; Creatinine, Ser 1.16; Potassium 4.0; Sodium 141   Lipid Panel    Component Value Date/Time   CHOL 109 06/27/2017 0211   TRIG 85 07/28/2017 1000   HDL 36 (L) 06/27/2017 0211   CHOLHDL 3.0 06/27/2017 0211   VLDL 15 06/27/2017 0211   LDLCALC 58 06/27/2017 0211    Additional studies/ records that were reviewed today include:   Echocardiogram: 06/2017 Study Conclusions  - Left ventricle: The cavity size was normal. Wall thickness was   increased in a pattern of mild LVH. The estimated ejection   fraction was 50%. There is hypokinesis of the   basal-midinferolateral and inferior myocardium. Doppler   parameters are consistent with abnormal left ventricular   relaxation (grade 1 diastolic dysfunction). - Aortic valve: There was mild regurgitation. - Mitral valve: There was mild regurgitation. - Left atrium: The atrium was mildly dilated. - Right atrium: Central venous pressure (est): 3 mm Hg. - Tricuspid valve: There was trivial regurgitation. - Pulmonary arteries:  Systolic pressure could not be accurately   estimated. - Pericardium, extracardiac: There  was no pericardial effusion.  Impressions:  - Mild LVH with LVEF approximately 50%. There is hypokinesis of the   mid to basal inferior/inferolateral walls. Grade 1 diastolic   dysfunction. Mild mitral regurgitation. Mild left atrial   enlargement. Mild aortic regurgitation. Trivial tricuspid   regurgitation.   LEFT HEART CATH AND CORS/GRAFTS ANGIOGRAPHY 06/2017  Conclusion   1. Severe 3 vessel CAD with total occlusion of the RCA, total occlusion of the mid-circumflex, and severe LAD/diagonal stenosis 2. 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 Total occlusion of all SVG's with the SVG-unknown target (probable OM1) as the patient's culprit occlusion 4. Moderate segmental LV systolic dysfunction  Complex situation in patient with critical limb ischemia, very poor functional capacity, and consideration of aorto-bifemoral bypass. He is not a candidate for acute intervention and medical therapy may be the most reasonable approach. The subclavian stenosis and ostial diagonal stenosis could be approached percutaneously but I'm not sure that would alter this patient's clinical course/prognosis. In my opinion he will be at high cardiac risk of major vascular surgery.  Diagnostic Diagram       ASSESSMENT & PLAN:    1. CAD  s/p CABG at [facility] in 2012 - Cardiac cath 06/2017 showed severe 3v disease with 1/4 patent grafts (LIMA to the LAD) with moderate LV dysfunction.medical management.  Patient denies any interventions since then.  LVEF of 50% with grade 1 DD by echo 06/2017. Continue aspirin 81 mg, Lipitor 40 mg, 60 mg daily and metoprolol 50 mg twice a day.  2.  Left subclavian artery stenosis s/p stenting with a VBX covered sten 07/2017 -This was done at [facility].  Continue aspirin and statin.  3. Critical limb  ischemia/PVD - Followed by [facility]  4. Surgical clearance for femoral popliteal bypass -Reviewed surgical clearance with daily Dr. Katrinka Blazing who saw the patient during last admission November 2018.  Patient denies any current symptoms of heart failure or angina.  He has chronic shortness of breath with minimal exertion, likely due to deconditioning.  Mostly bedridden.  Denies palpitation.  Patient will clear at very high risk for cardiac complications including but not limited to heart failure, rhythm issue, MI or sudden cardiac death.  Discussed risk with patient in detail.  He is willing to proceed with surgery.  5. HLD - 06/27/2017: Cholesterol 109; HDL 36; LDL Cholesterol 58; VLDL 15 07/28/2017: Triglycerides 85  - Continue statin.   Patient will follow-up with cardiologist at [place], [place].    Medication Adjustments/Labs and Tests Ordered: Current medicines are reviewed at length with the patient today.  Concerns regarding medicines are outlined above.  Medication changes, Labs and Tests ordered today are listed in the Patient Instructions below. Patient Instructions  Medication Instructions:  Your physician recommends that you continue on your current medications as directed. Please refer to the Current Medication list given to you today.   Labwork: None Ordered   Testing/Procedures: None Ordered   Follow-Up: Your physician recommends that you schedule a follow-up appointment in: as needed with Dr. Tresa Endo    If you need a refill on your cardiac medications before your next appointment, please call your pharmacy.   Thank you for choosing [facility]! Eligha Bridegroom, RN [phone]       Signed, Manson Passey, PA  12/22/2017 1:37 PM    [facility] [address] [place], [place], [place]  [address] Phone: [phone];  Fax: [phone]  

## 2017-12-25 ENCOUNTER — Inpatient Hospital Stay (HOSPITAL_COMMUNITY)
Admission: EM | Admit: 2017-12-25 | Discharge: 2018-01-01 | DRG: 299 | Disposition: A | Discharge status: Skilled Nursing Facility | Payer: Medicare HMO | Attending: Internal Medicine | Admitting: Internal Medicine

## 2017-12-25 ENCOUNTER — Encounter (HOSPITAL_COMMUNITY): Payer: Self-pay | Admitting: *Deleted

## 2017-12-25 ENCOUNTER — Emergency Department (HOSPITAL_COMMUNITY): Payer: Medicare HMO

## 2017-12-25 DIAGNOSIS — F419 Anxiety disorder, unspecified: Secondary | ICD-10-CM | POA: Diagnosis not present

## 2017-12-25 DIAGNOSIS — Z6821 Body mass index (BMI) 21.0-21.9, adult: Secondary | ICD-10-CM

## 2017-12-25 DIAGNOSIS — I69354 Hemiplegia and hemiparesis following cerebral infarction affecting left non-dominant side: Secondary | ICD-10-CM

## 2017-12-25 DIAGNOSIS — I25810 Atherosclerosis of coronary artery bypass graft(s) without angina pectoris: Secondary | ICD-10-CM | POA: Diagnosis not present

## 2017-12-25 DIAGNOSIS — I10 Essential (primary) hypertension: Secondary | ICD-10-CM | POA: Diagnosis not present

## 2017-12-25 DIAGNOSIS — Z01810 Encounter for preprocedural cardiovascular examination: Secondary | ICD-10-CM | POA: Diagnosis not present

## 2017-12-25 DIAGNOSIS — N179 Acute kidney failure, unspecified: Secondary | ICD-10-CM | POA: Diagnosis present

## 2017-12-25 DIAGNOSIS — F17210 Nicotine dependence, cigarettes, uncomplicated: Secondary | ICD-10-CM | POA: Diagnosis not present

## 2017-12-25 DIAGNOSIS — L97909 Non-pressure chronic ulcer of unspecified part of unspecified lower leg with unspecified severity: Secondary | ICD-10-CM | POA: Diagnosis not present

## 2017-12-25 DIAGNOSIS — Z720 Tobacco use: Secondary | ICD-10-CM | POA: Diagnosis present

## 2017-12-25 DIAGNOSIS — I4581 Long QT syndrome: Secondary | ICD-10-CM | POA: Diagnosis present

## 2017-12-25 DIAGNOSIS — E1165 Type 2 diabetes mellitus with hyperglycemia: Secondary | ICD-10-CM | POA: Diagnosis present

## 2017-12-25 DIAGNOSIS — E43 Unspecified severe protein-calorie malnutrition: Secondary | ICD-10-CM | POA: Diagnosis present

## 2017-12-25 DIAGNOSIS — I129 Hypertensive chronic kidney disease with stage 1 through stage 4 chronic kidney disease, or unspecified chronic kidney disease: Secondary | ICD-10-CM | POA: Diagnosis not present

## 2017-12-25 DIAGNOSIS — I739 Peripheral vascular disease, unspecified: Secondary | ICD-10-CM | POA: Diagnosis not present

## 2017-12-25 DIAGNOSIS — E785 Hyperlipidemia, unspecified: Secondary | ICD-10-CM | POA: Diagnosis not present

## 2017-12-25 DIAGNOSIS — Z716 Tobacco abuse counseling: Secondary | ICD-10-CM

## 2017-12-25 DIAGNOSIS — I25700 Atherosclerosis of coronary artery bypass graft(s), unspecified, with unstable angina pectoris: Secondary | ICD-10-CM | POA: Diagnosis not present

## 2017-12-25 DIAGNOSIS — D72829 Elevated white blood cell count, unspecified: Secondary | ICD-10-CM | POA: Diagnosis not present

## 2017-12-25 DIAGNOSIS — E1122 Type 2 diabetes mellitus with diabetic chronic kidney disease: Secondary | ICD-10-CM | POA: Diagnosis not present

## 2017-12-25 DIAGNOSIS — G8929 Other chronic pain: Secondary | ICD-10-CM | POA: Diagnosis present

## 2017-12-25 DIAGNOSIS — I7409 Other arterial embolism and thrombosis of abdominal aorta: Secondary | ICD-10-CM | POA: Diagnosis present

## 2017-12-25 DIAGNOSIS — M1990 Unspecified osteoarthritis, unspecified site: Secondary | ICD-10-CM | POA: Diagnosis not present

## 2017-12-25 DIAGNOSIS — L89152 Pressure ulcer of sacral region, stage 2: Secondary | ICD-10-CM | POA: Diagnosis present

## 2017-12-25 DIAGNOSIS — Z79899 Other long term (current) drug therapy: Secondary | ICD-10-CM

## 2017-12-25 DIAGNOSIS — Z951 Presence of aortocoronary bypass graft: Secondary | ICD-10-CM | POA: Diagnosis not present

## 2017-12-25 DIAGNOSIS — I252 Old myocardial infarction: Secondary | ICD-10-CM | POA: Diagnosis not present

## 2017-12-25 DIAGNOSIS — F329 Major depressive disorder, single episode, unspecified: Secondary | ICD-10-CM | POA: Diagnosis present

## 2017-12-25 DIAGNOSIS — J45909 Unspecified asthma, uncomplicated: Secondary | ICD-10-CM | POA: Diagnosis present

## 2017-12-25 DIAGNOSIS — D638 Anemia in other chronic diseases classified elsewhere: Secondary | ICD-10-CM | POA: Diagnosis not present

## 2017-12-25 DIAGNOSIS — Z7401 Bed confinement status: Secondary | ICD-10-CM

## 2017-12-25 DIAGNOSIS — E1152 Type 2 diabetes mellitus with diabetic peripheral angiopathy with gangrene: Principal | ICD-10-CM | POA: Diagnosis present

## 2017-12-25 DIAGNOSIS — Z95820 Peripheral vascular angioplasty status with implants and grafts: Secondary | ICD-10-CM | POA: Diagnosis not present

## 2017-12-25 DIAGNOSIS — Z7982 Long term (current) use of aspirin: Secondary | ICD-10-CM

## 2017-12-25 DIAGNOSIS — E782 Mixed hyperlipidemia: Secondary | ICD-10-CM

## 2017-12-25 DIAGNOSIS — N189 Chronic kidney disease, unspecified: Secondary | ICD-10-CM | POA: Diagnosis not present

## 2017-12-25 DIAGNOSIS — Z7984 Long term (current) use of oral hypoglycemic drugs: Secondary | ICD-10-CM

## 2017-12-25 DIAGNOSIS — I96 Gangrene, not elsewhere classified: Secondary | ICD-10-CM | POA: Diagnosis not present

## 2017-12-25 DIAGNOSIS — Z8249 Family history of ischemic heart disease and other diseases of the circulatory system: Secondary | ICD-10-CM

## 2017-12-25 DIAGNOSIS — I998 Other disorder of circulatory system: Secondary | ICD-10-CM | POA: Diagnosis present

## 2017-12-25 DIAGNOSIS — Z79891 Long term (current) use of opiate analgesic: Secondary | ICD-10-CM

## 2017-12-25 DIAGNOSIS — E1151 Type 2 diabetes mellitus with diabetic peripheral angiopathy without gangrene: Secondary | ICD-10-CM | POA: Diagnosis not present

## 2017-12-25 DIAGNOSIS — L89159 Pressure ulcer of sacral region, unspecified stage: Secondary | ICD-10-CM | POA: Diagnosis present

## 2017-12-25 DIAGNOSIS — E1140 Type 2 diabetes mellitus with diabetic neuropathy, unspecified: Secondary | ICD-10-CM | POA: Diagnosis present

## 2017-12-25 LAB — CBC WITH DIFFERENTIAL/PLATELET
Basophils Absolute: 0 10*3/uL (ref 0.0–0.1)
Basophils Relative: 0 %
Eosinophils Absolute: 0.2 10*3/uL (ref 0.0–0.7)
Eosinophils Relative: 1 %
HCT: 35.7 % — ABNORMAL LOW (ref 39.0–52.0)
Hemoglobin: 11.5 g/dL — ABNORMAL LOW (ref 13.0–17.0)
LYMPHS ABS: 2 10*3/uL (ref 0.7–4.0)
LYMPHS PCT: 12 %
MCH: 25.8 pg — AB (ref 26.0–34.0)
MCHC: 32.2 g/dL (ref 30.0–36.0)
MCV: 80 fL (ref 78.0–100.0)
MONO ABS: 1.2 10*3/uL — AB (ref 0.1–1.0)
MONOS PCT: 7 %
Neutro Abs: 13.2 10*3/uL — ABNORMAL HIGH (ref 1.7–7.7)
Neutrophils Relative %: 80 %
Platelets: 243 10*3/uL (ref 150–400)
RBC: 4.46 MIL/uL (ref 4.22–5.81)
RDW: 16.8 % — AB (ref 11.5–15.5)
WBC: 16.6 10*3/uL — ABNORMAL HIGH (ref 4.0–10.5)

## 2017-12-25 LAB — I-STAT TROPONIN, ED: Troponin i, poc: 0.03 ng/mL (ref 0.00–0.08)

## 2017-12-25 LAB — COMPREHENSIVE METABOLIC PANEL
ALT: 28 U/L (ref 17–63)
AST: 32 U/L (ref 15–41)
Albumin: 2.4 g/dL — ABNORMAL LOW (ref 3.5–5.0)
Alkaline Phosphatase: 209 U/L — ABNORMAL HIGH (ref 38–126)
Anion gap: 9 (ref 5–15)
BILIRUBIN TOTAL: 0.6 mg/dL (ref 0.3–1.2)
BUN: 35 mg/dL — AB (ref 6–20)
CHLORIDE: 105 mmol/L (ref 101–111)
CO2: 22 mmol/L (ref 22–32)
Calcium: 8.3 mg/dL — ABNORMAL LOW (ref 8.9–10.3)
Creatinine, Ser: 1.49 mg/dL — ABNORMAL HIGH (ref 0.61–1.24)
GFR calc Af Amer: 55 mL/min — ABNORMAL LOW (ref >60)
GFR, EST NON AFRICAN AMERICAN: 47 mL/min — AB (ref >60)
GLUCOSE: 186 mg/dL — AB (ref 65–99)
POTASSIUM: 4.8 mmol/L (ref 3.5–5.1)
Sodium: 136 mmol/L (ref 135–145)
Total Protein: 6 g/dL — ABNORMAL LOW (ref 6.5–8.1)

## 2017-12-25 LAB — PROTIME-INR
INR: 1.31
PROTHROMBIN TIME: 16.2 s — AB (ref 11.4–15.2)

## 2017-12-25 LAB — TYPE AND SCREEN
ABO/RH(D): A POS
ANTIBODY SCREEN: NEGATIVE

## 2017-12-25 LAB — I-STAT CG4 LACTIC ACID, ED
LACTIC ACID, VENOUS: 1.05 mmol/L (ref 0.5–1.9)
Lactic Acid, Venous: 1.33 mmol/L (ref 0.5–1.9)

## 2017-12-25 MED ORDER — MUPIROCIN 2 % EX OINT
1.0000 "application " | TOPICAL_OINTMENT | Freq: Three times a day (TID) | CUTANEOUS | Status: DC
  Administered 2017-12-26 – 2017-12-31 (×15): 1 via TOPICAL
  Filled 2017-12-25 (×5): qty 22

## 2017-12-25 MED ORDER — INSULIN ASPART 100 UNIT/ML ~~LOC~~ SOLN
0.0000 [IU] | Freq: Three times a day (TID) | SUBCUTANEOUS | Status: DC
  Administered 2017-12-26: 1 [IU] via SUBCUTANEOUS
  Administered 2017-12-27: 2 [IU] via SUBCUTANEOUS
  Administered 2017-12-27: 1 [IU] via SUBCUTANEOUS
  Administered 2017-12-27 – 2017-12-28 (×2): 2 [IU] via SUBCUTANEOUS
  Administered 2017-12-28: 1 [IU] via SUBCUTANEOUS
  Administered 2017-12-28 – 2017-12-29 (×4): 2 [IU] via SUBCUTANEOUS
  Administered 2017-12-30: 1 [IU] via SUBCUTANEOUS
  Administered 2017-12-30: 2 [IU] via SUBCUTANEOUS
  Administered 2017-12-30: 1 [IU] via SUBCUTANEOUS
  Administered 2018-01-01: 3 [IU] via SUBCUTANEOUS
  Administered 2018-01-01 (×2): 2 [IU] via SUBCUTANEOUS
  Filled 2017-12-25: qty 1

## 2017-12-25 MED ORDER — ACETAMINOPHEN 650 MG RE SUPP: 650.0000 mg | Freq: Four times a day (QID) | RECTAL | Status: DC | PRN

## 2017-12-25 MED ORDER — ONDANSETRON HCL 4 MG PO TABS: 4.0000 mg | ORAL_TABLET | Freq: Four times a day (QID) | ORAL | Status: DC | PRN

## 2017-12-25 MED ORDER — ATORVASTATIN CALCIUM 80 MG PO TABS
80.0000 mg | ORAL_TABLET | Freq: Every day | ORAL | Status: DC
  Administered 2017-12-26 – 2017-12-31 (×7): 80 mg via ORAL
  Filled 2017-12-25 (×7): qty 1

## 2017-12-25 MED ORDER — MORPHINE SULFATE (PF) 4 MG/ML IV SOLN
4.0000 mg | INTRAVENOUS | Status: AC
  Administered 2017-12-25: 4 mg via INTRAVENOUS
  Filled 2017-12-25: qty 1

## 2017-12-25 MED ORDER — SODIUM CHLORIDE 0.9 % IV SOLN
INTRAVENOUS | Status: DC
  Administered 2017-12-26 – 2017-12-30 (×8): via INTRAVENOUS

## 2017-12-25 MED ORDER — IPRATROPIUM-ALBUTEROL 0.5-2.5 (3) MG/3ML IN SOLN
3.0000 mL | Freq: Four times a day (QID) | RESPIRATORY_TRACT | Status: DC | PRN
  Administered 2018-01-01: 3 mL via RESPIRATORY_TRACT
  Filled 2017-12-25: qty 3

## 2017-12-25 MED ORDER — VANCOMYCIN HCL IN DEXTROSE 1-5 GM/200ML-% IV SOLN
1000.0000 mg | Freq: Once | INTRAVENOUS | Status: AC
  Administered 2017-12-25: 1000 mg via INTRAVENOUS
  Filled 2017-12-25: qty 200

## 2017-12-25 MED ORDER — INSULIN ASPART 100 UNIT/ML ~~LOC~~ SOLN
0.0000 [IU] | Freq: Every day | SUBCUTANEOUS | Status: DC
Start: 2017-12-25 — End: 2018-01-01

## 2017-12-25 MED ORDER — GABAPENTIN 100 MG PO CAPS
100.0000 mg | ORAL_CAPSULE | Freq: Two times a day (BID) | ORAL | Status: DC
  Administered 2017-12-26 – 2018-01-01 (×14): 100 mg via ORAL
  Filled 2017-12-25 (×14): qty 1

## 2017-12-25 MED ORDER — OXYCODONE HCL 5 MG PO TABS
5.0000 mg | ORAL_TABLET | Freq: Four times a day (QID) | ORAL | Status: DC | PRN
  Administered 2017-12-26 – 2018-01-01 (×12): 5 mg via ORAL
  Filled 2017-12-25 (×14): qty 1

## 2017-12-25 MED ORDER — MORPHINE SULFATE (PF) 2 MG/ML IV SOLN
2.0000 mg | INTRAVENOUS | Status: DC | PRN
  Administered 2017-12-26 – 2018-01-01 (×19): 2 mg via INTRAVENOUS
  Filled 2017-12-25 (×19): qty 1

## 2017-12-25 MED ORDER — SODIUM CHLORIDE 0.9 % IV BOLUS
1000.0000 mL | Freq: Once | INTRAVENOUS | Status: AC
  Administered 2017-12-25: 1000 mL via INTRAVENOUS

## 2017-12-25 MED ORDER — ISOSORBIDE MONONITRATE ER 60 MG PO TB24
60.0000 mg | ORAL_TABLET | Freq: Every day | ORAL | Status: DC
  Administered 2017-12-26 – 2018-01-01 (×8): 60 mg via ORAL
  Filled 2017-12-25 (×8): qty 1

## 2017-12-25 MED ORDER — ONDANSETRON HCL 4 MG/2ML IJ SOLN: 4.0000 mg | Freq: Four times a day (QID) | INTRAMUSCULAR | Status: DC | PRN

## 2017-12-25 MED ORDER — ENOXAPARIN SODIUM 40 MG/0.4ML ~~LOC~~ SOLN
40.0000 mg | Freq: Every day | SUBCUTANEOUS | Status: DC
  Administered 2017-12-26 – 2017-12-31 (×7): 40 mg via SUBCUTANEOUS
  Filled 2017-12-25 (×7): qty 0.4

## 2017-12-25 MED ORDER — ASPIRIN EC 81 MG PO TBEC
81.0000 mg | DELAYED_RELEASE_TABLET | Freq: Every day | ORAL | Status: DC
  Administered 2017-12-26 – 2018-01-01 (×7): 81 mg via ORAL
  Filled 2017-12-25 (×7): qty 1

## 2017-12-25 MED ORDER — ACETAMINOPHEN 325 MG PO TABS
650.0000 mg | ORAL_TABLET | Freq: Four times a day (QID) | ORAL | Status: DC | PRN
  Administered 2017-12-27: 650 mg via ORAL
  Filled 2017-12-25: qty 2

## 2017-12-25 MED ORDER — PIPERACILLIN-TAZOBACTAM 3.375 G IVPB 30 MIN
3.3750 g | Freq: Once | INTRAVENOUS | Status: AC
  Administered 2017-12-25: 3.375 g via INTRAVENOUS
  Filled 2017-12-25: qty 50

## 2017-12-25 NOTE — ED Triage Notes (Signed)
Per EMS, pt has wounds to feet with necrotic tissue. Pt states he has been having pain in his feet for the past 6 months. Pt has hx of diabetes, neuropathy. Pt has home health come every other day and stays with sister.

## 2017-12-25 NOTE — H&P (Signed)
History and Physical    Jordan Gardner ZOX:[id number] DOB: 1950-08-29 DOA: 12/25/2017  Referring MD/NP/PA: Dr. Chaney Malling PCP: [facility]  Patient coming from: Home  Chief Complaint: Foot pain  I have personally briefly reviewed patient's old medical records in [place] [facility]   HPI: Jordan Gardner is a 67 y.o. male with medical history significant of HTN, HLD, severe PVD, CAD s/p CABG, CVA with residual left-sided weakness, DM type II, and tobacco abuse; who presents with complaints of worsening bilateral foot pain.  Symptoms have been present for several months.  Describes the pain as severe, and rates it a 9/10 on the pain scale.  Patient has been taking home oxycodone without any significant relief of symptoms.  Home health nurse and his wife have been putting set up over the feet, but reports worsening appearance of toes.  Due to the symptoms patient has been unable to ambulate.  He had already been referred and seen by Dr. Lajoyce Corners and Dr. Rubye Oaks of vascular surgery.  He had been undergoing perioperative testing for planned femoral-popliteal bypass.  Patient has been seen by Dr. Iver Nestle on 5/7 for cardiac clearance.  Review of records shows that patient was thought to be high risk for cardiac complications, but he was willing to proceed with surgery.  ED Course: Vital signs on admission into the emergency department were noted to be relatively within normal limits.  Labs revealed WBC 16.6, hemoglobin 11.5, BUN 35, creatinine 1.49, and lactic acid 1.05.  Blood cultures were obtained.  He received 1 L normal saline IV fluids and empiric antibiotics of vancomycin and Zosyn. Dr. Randie Heinz of vascular surgery was consulted and recommended transfer to [facility] for possible need of procedure. Dr.Dickerson likely to see in a.m.  Review of Systems  Constitutional: Positive for chills. Negative for fever and malaise/fatigue.  HENT: Negative for congestion and nosebleeds.   Eyes:  Negative for photophobia and pain.  Respiratory: Positive for shortness of breath.   Cardiovascular: Positive for claudication. Negative for chest pain and leg swelling.  Gastrointestinal: Negative for abdominal pain, nausea and vomiting.  Genitourinary: Negative for dysuria and frequency.  Musculoskeletal: Positive for joint pain and myalgias. Negative for falls.  Skin: Negative for itching.       Positive for wound of lower extremity  Neurological: Positive for weakness. Negative for focal weakness and loss of consciousness.  Endo/Heme/Allergies: Negative for polydipsia.  Psychiatric/Behavioral: Negative for memory loss and suicidal ideas.    Past Medical History:  Diagnosis Date  . Anxiety   . Arthritis   . Asthma   . Coronary artery disease   . Depression   . Diabetes mellitus without complication (HCC)    type 2  . Headache   . Hyperlipidemia   . Hypertension   . Myocardial infarction (HCC)   . Neuromuscular disorder (HCC)   . PAD (peripheral artery disease) (HCC)   . Protein calorie malnutrition (HCC) 04/2017  . Psychotic affective disorder (HCC)    Per sister. Unsure of Dx. Pt sleeps late and difficult to wake. odd affect  . Sacral decubitus ulcer 04/2017  . Stroke [facility])    left side weakness    Past Surgical History:  Procedure Laterality Date  . ABDOMINAL AORTOGRAM W/LOWER EXTREMITY N/A 06/08/2017   Procedure: ABDOMINAL AORTOGRAM W/LOWER EXTREMITY;  Surgeon: Chuck Hint, MD;  [facility];  Service: Cardiovascular;  Laterality: N/A;  . BACK SURGERY    . CARDIAC CATHETERIZATION    .  CARDIAC SURGERY     CABG x5 2012 - [facility]  . CAROTID-SUBCLAVIAN BYPASS GRAFT Left 07/23/2017   Procedure: BYPASS SUBCLAVIAN ARTERY  USING VIABAHN GORE STENT GRAFT;  Surgeon: Chuck Hint, MD;  Location: [facility] OR;  Service: Vascular;  Laterality: Left;  . LEFT HEART CATH AND CORS/GRAFTS ANGIOGRAPHY N/A 06/26/2017   Procedure: LEFT HEART CATH AND  CORS/GRAFTS ANGIOGRAPHY;  Surgeon: Tonny Bollman, MD;  Location: [facility] INVASIVE CV LAB;  Service: Cardiovascular;  Laterality: N/A;     reports that he has been smoking cigarettes.  He has been smoking about 1.00 pack per day. He has never used smokeless tobacco. He reports that he does not drink alcohol or use drugs.  No Known Allergies  Family History  Problem Relation Age of Onset  . Hypertension Sister     Prior to Admission medications   Medication Sig Start Date End Date Taking? Authorizing Provider  aspirin EC 81 MG EC tablet Take 1 tablet (81 mg total) by mouth daily. 04/30/17   Cleora Fleet, MD  aspirin-acetaminophen-caffeine (EXCEDRIN MIGRAINE) [phone] MG tablet Take 2 tablets by mouth every 8 (eight) hours as needed for headache.    [provider]  atorvastatin (LIPITOR) 40 MG tablet Take 80 mg by mouth at bedtime.    [provider]  feeding supplement, GLUCERNA SHAKE, (GLUCERNA SHAKE) LIQD Take 237 mLs by mouth 2 (two) times daily between meals. 04/30/17   Johnson, Clanford L, MD  furosemide (LASIX) 40 MG tablet Take 1 tablet (40 mg total) by mouth daily. 08/05/17   Hongalgi, Maximino Greenland, MD  gabapentin (NEURONTIN) 100 MG capsule Take 1 capsule (100 mg total) by mouth 2 (two) times daily. 08/04/17   Elease Etienne, MD  isosorbide mononitrate (IMDUR) 30 MG 24 hr tablet Take 2 tablets (60 mg total) daily by mouth. 07/01/17   Arty Baumgartner, NP  lisinopril (PRINIVIL,ZESTRIL) 2.5 MG tablet Take 1 tablet (2.5 mg total) daily by mouth. 07/02/17   Arty Baumgartner, NP  metFORMIN (GLUCOPHAGE) 500 MG tablet Take 1,000 mg by mouth 2 (two) times daily with a meal.    [provider]  metoprolol tartrate (LOPRESSOR) 50 MG tablet Take 1 tablet (50 mg total) 2 (two) times daily by mouth. 07/01/17   Arty Baumgartner, NP  mirtazapine (REMERON) 15 MG tablet Take 15 mg by mouth at bedtime.    [provider]  Multiple Vitamin (MULTIVITAMIN WITH  MINERALS) TABS tablet Take 1 tablet by mouth daily. 04/30/17   Johnson, Clanford L, MD  mupirocin ointment (BACTROBAN) 2 % Apply 1 application topically 3 (three) times daily. 10/22/17   Elvina Sidle, MD  nicotine (NICODERM CQ - DOSED IN MG/24 HOURS) 14 mg/24hr patch Place 1 patch (14 mg total) onto the skin daily. 04/30/17   Johnson, Clanford L, MD  nitroGLYCERIN (NITROSTAT) 0.4 MG SL tablet Place 1 tablet (0.4 mg total) under the tongue every 5 (five) minutes x 3 doses as needed for chest pain. 04/29/17   Johnson, Clanford L, MD  oxyCODONE (OXY IR/ROXICODONE) 5 MG immediate release tablet Take 1 tablet (5 mg total) by mouth every 6 (six) hours as needed for moderate pain. 08/04/17   Hongalgi, Maximino Greenland, MD  sertraline (ZOLOFT) 100 MG tablet Take 100 mg by mouth daily. DEPRESSION/ANXIETY    [provider]    Physical Exam:  Constitutional: Elderly male who appears to be in some discomfort Vitals:   12/25/17 1736 12/25/17 1800 12/25/17 1900 12/25/17 1925  BP: 135/71 (!) 141/73 (!) 150/81 (!) 150/81  Pulse: 71 70 76 77  Resp:    18  Temp:    98.5 F (36.9 C)  TempSrc:    Oral  SpO2: 94% 98% 92% 100%   Eyes: PERRL, lids and conjunctivae normal ENMT: Mucous membranes are moist. Posterior pharynx clear of any exudate or lesions.Normal dentition.  Neck: normal, supple, no masses, no thyromegaly Respiratory: clear to auscultation bilaterally, no wheezing, no crackles. Normal respiratory effort. No accessory muscle use.  Cardiovascular: Regular rate and rhythm, no murmurs / rubs / gallops. No extremity edema. 2+ pedal pulses. No carotid bruits.  Abdomen: no tenderness, no masses palpated. No hepatosplenomegaly. Bowel sounds positive.  Musculoskeletal: Cyanotic appearance of the distal toes. Skin: Necrotic appearance of bilateral toes with  odor as seen below    Neurologic: CN 2-12 grossly intact. Sensation intact, DTR normal. Strength 5/5 in all 4.  Psychiatric: Normal judgment and  insight. Alert and oriented x 3. Normal mood.     Labs on Admission: I have personally reviewed following labs and imaging studies  CBC: Recent Labs  Lab 12/25/17 1735  WBC 16.6*  NEUTROABS 13.2*  HGB 11.5*  HCT 35.7*  MCV 80.0  PLT 243   Basic Metabolic Panel: Recent Labs  Lab 12/25/17 1735  NA 136  K 4.8  CL 105  CO2 22  GLUCOSE 186*  BUN 35*  CREATININE 1.49*  CALCIUM 8.3*   GFR: Estimated Creatinine Clearance: 48.6 mL/min (A) (by C-G formula based on SCr of 1.49 mg/dL (H)). Liver Function Tests: Recent Labs  Lab 12/25/17 1735  AST 32  ALT 28  ALKPHOS 209*  BILITOT 0.6  PROT 6.0*  ALBUMIN 2.4*   No results for input(s): LIPASE, AMYLASE in the last 168 hours. No results for input(s): AMMONIA in the last 168 hours. Coagulation Profile: Recent Labs  Lab 12/25/17 1735  INR 1.31   Cardiac Enzymes: No results for input(s): CKTOTAL, CKMB, CKMBINDEX, TROPONINI in the last 168 hours. BNP (last 3 results) No results for input(s): PROBNP in the last 8760 hours. HbA1C: No results for input(s): HGBA1C in the last 72 hours. CBG: No results for input(s): GLUCAP in the last 168 hours. Lipid Profile: No results for input(s): CHOL, HDL, LDLCALC, TRIG, CHOLHDL, LDLDIRECT in the last 72 hours. Thyroid Function Tests: No results for input(s): TSH, T4TOTAL, FREET4, T3FREE, THYROIDAB in the last 72 hours. Anemia Panel: No results for input(s): VITAMINB12, FOLATE, FERRITIN, TIBC, IRON, RETICCTPCT in the last 72 hours. Urine analysis:    Component Value Date/Time   COLORURINE AMBER (A) 07/28/2017 1025   APPEARANCEUR HAZY (A) 07/28/2017 1025   LABSPEC 1.026 07/28/2017 1025   PHURINE 5.0 07/28/2017 1025   GLUCOSEU NEGATIVE 07/28/2017 1025   HGBUR NEGATIVE 07/28/2017 1025   BILIRUBINUR NEGATIVE 07/28/2017 1025   KETONESUR NEGATIVE 07/28/2017 1025   PROTEINUR >=300 (A) 07/28/2017 1025   NITRITE NEGATIVE 07/28/2017 1025   LEUKOCYTESUR NEGATIVE 07/28/2017 1025    Sepsis Labs: No results found for this or any previous visit (from the past 240 hour(s)).   Radiological Exams on Admission: Dg Chest 1 View  Result Date: 12/25/2017 CLINICAL DATA:  Proper evaluation for foot amputation, history hypertension, type II diabetes mellitus, stroke, coronary artery disease, smoker EXAM: CHEST  1 VIEW COMPARISON:  Portable exam 1713 hours compared to 07/31/2017 FINDINGS: Upper normal size of cardiac silhouette post CABG. Mediastinal contours and pulmonary vascularity normal. Skin folds project over the lungs bilaterally. Lungs clear. No  acute infiltrate, pleural effusion or pneumothorax. Bones unremarkable. IMPRESSION: Post CABG. No acute abnormalities. Electronically Signed   By: Ulyses Southward M.D.   On: 12/25/2017 18:42   Dg Foot Complete Left  Result Date: 12/25/2017 CLINICAL DATA:  Necrotic wounds at both feet EXAM: LEFT FOOT - COMPLETE 3+ VIEW COMPARISON:  10/29/2017 FINDINGS: Mild osseous demineralization. Joint spaces preserved. No acute fracture, dislocation, or bone destruction. Soft tissues unremarkable. IMPRESSION: No acute abnormalities. Electronically Signed   By: Ulyses Southward M.D.   On: 12/25/2017 18:38   Dg Foot Complete Right  Result Date: 12/25/2017 CLINICAL DATA:  Necrotic wounds at both feet EXAM: RIGHT FOOT COMPLETE - 3+ VIEW COMPARISON:  10/29/2017 FINDINGS: Osseous demineralization. Advanced degenerative changes at first MTP joint. Mild degenerative changes at IP joint great toe. Remaining joint spaces fairly well preserved. No acute fracture, dislocation, or bone destruction. Soft tissues radiographically unremarkable. 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 No acute bony abnormalities. Electronically Signed   By: Ulyses Southward M.D.   On: 12/25/2017 18:39    EKG: Independently reviewed.  Sinus rhythm at 80 bpm with prolonged QTC 505  Assessment/Plan Gangrene of the foot,  history of  peripheral vascular disease: Acute on chronic.  Patient symptoms have been progressively worsening for the last several months.  Unable to ambulate.  Patient has significant necrotic appearance of multiple toes of the bilateral feet.  Patient placed on empiric antibiotics for possible underlying infection.  Vascular surgery was consulted and will see the patient in a.m. Patient had been seen by cardiology and noted to be a high risk candidate for any surgical procedure - Admit to a telemetry bed - Follow-up blood cultures - Continue empiric antibiotics of vancomycin and Zosyn - N.p.o. after midnight - Morphine IV as needed pain - Type and screen for possible need of blood products in a.m. once at [facility] - Appreciate vascular surgery consultative services, will follow-up for further recommendations  Leukocytosis WBC elevated at 16.6.  Suspect secondary to above. - Recheck CBC in a.m.  Acute kidney injury: Acute.  Patient presents with a creatinine of 1.49 with BUN elevated at 35 to suggest prerenal cause of symptoms.  His baseline creatinine previously noted to be around 1.1. - IV fluids normal saline at 75 mL/h overnight - Hold nephrotoxic agents - Recheck creatinine in a.m.  Sacral pressure ulcer: Chronic. 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 - Continue aspirin, isosorbide mononitrate, and statin  Diabetes mellitus type 2: uncontrolled.   Patient's last hemoglobin A1c noted to be 7.1 on 07/28/2017. - Hypoglycemic protocol - Hold metformin - CBGs q. before meals and at bedtime with sensitive SSI - Continue gabapentin  Prolonged QT interval - May want to recheck QT see in a.m. - Avoid QT prolonging medication  Anemia of chronic disease: Hemoglobin noted to be 11.5 on admission.   - Recheck CBC in a.m.      Essential hypertension - Hold lisinopril and furosemide, and restart when medically appropriate  Hyperlipidemia - Continue atorvastatin  Tobacco abuse: Patient still reports smoking tobacco. - Nicotine patch - Counseled on need of cessation of tobacco  DVT prophylaxis: Lovenox Code Status: Full Family Communication: Discussed plan of care with the patient family present at bedside Disposition Plan: To be determined  Consults called: Vascular surgery Admission status: Inpatient  Clydie Braun MD [facility] Pager [phone]   If 7PM-7AM, please contact night-coverage [email or web address] Password [facility]  12/25/2017, 7:53 PM

## 2017-12-25 NOTE — ED Provider Notes (Signed)
[facility]-EMERGENCY DEPT Provider Note   CSN: [id number] Arrival date & time: 12/25/17  1600     History   Chief Complaint Chief Complaint  Patient presents with  . Foot Pain    bilateral    HPI Jordan Gardner is a 67 y.o. male history of CAD, PVD, diabetes, known bilateral foot ulcers here presenting with bilateral toe pain and discharge.  Patient has been followed with Dr. Lajoyce Corners as well as Dr. Rubye Oaks for his foot ulcers.  He is supposed to get femoropopliteal bypass as well as bilateral amputations in a month.  He does have home care that changes his dressing every other day.  Patient states that for the last several weeks, he has progressive increased pain in bilateral big toes.  He also noticed discharge from the right big toe and "big toe started to disappear".  Denies any fevers or chills.  He lives at home with sister and EMS was called today for increasing pain and discharge from the toe.  The history is provided by the patient.    Past Medical History:  Diagnosis Date  . Anxiety   . Arthritis   . Asthma   . Coronary artery disease   . Depression   . Diabetes mellitus without complication (HCC)    type 2  . Headache   . Hyperlipidemia   . Hypertension   . Myocardial infarction (HCC)   . Neuromuscular disorder (HCC)   . PAD (peripheral artery disease) (HCC)   . Protein calorie malnutrition (HCC) 04/2017  . Psychotic affective disorder (HCC)    Per sister. Unsure of Dx. Pt sleeps late and difficult to wake. odd affect  . Sacral decubitus ulcer 04/2017  . Stroke [facility])    left side weakness    Patient Active Problem List   Diagnosis Date Noted  . Gangrene of foot (HCC) 10/29/2017  . Acute hypoxemic respiratory failure (HCC) 07/28/2017  . 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 Critical lower limb ischemia   .  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 AKI (acute kidney injury) (HCC) 04/28/2017  . Malnutrition of moderate degree 04/28/2017  . Pressure injury of skin 04/27/2017  . Protein calorie malnutrition (HCC) 04/27/2017  . Physical deconditioning 04/27/2017  . Ulcer of great toe (HCC) 04/27/2017  . Claudication (HCC) 04/27/2017  . Sacral decubitus ulcer 04/27/2017  . Accelerated hypertension   . Hyperlipidemia   . Tobacco abuse disorder   . Altered mental status   . PAD (peripheral artery disease) (HCC)   . NSTEMI (non-ST elevated myocardial infarction) (HCC) 04/25/2017  . NECK PAIN 10/16/2008  . BACK PAIN 10/16/2008    Past Surgical History:  Procedure Laterality Date  . ABDOMINAL AORTOGRAM W/LOWER EXTREMITY N/A 06/08/2017   Procedure: ABDOMINAL AORTOGRAM W/LOWER EXTREMITY;  Surgeon: Chuck Hint, MD;  Location: [facility] INVASIVE CV LAB;  Service: Cardiovascular;  Laterality: N/A;  . BACK SURGERY    . CARDIAC CATHETERIZATION    . CARDIAC SURGERY     CABG x5 2012 - [facility]  . CAROTID-SUBCLAVIAN BYPASS GRAFT Left 07/23/2017   Procedure: BYPASS SUBCLAVIAN ARTERY  USING VIABAHN GORE STENT GRAFT;  Surgeon: Chuck Hint, MD;  Location: [facility] OR;  Service: Vascular;  Laterality: Left;  . LEFT HEART CATH AND CORS/GRAFTS ANGIOGRAPHY N/A 06/26/2017   Procedure: LEFT HEART CATH AND CORS/GRAFTS ANGIOGRAPHY;  Surgeon: Tonny Bollman, MD;  Location: [facility] INVASIVE CV LAB;  Service: Cardiovascular;  Laterality: N/A;        Home Medications    Prior to Admission medications   Medication Sig Start Date End Date Taking? Authorizing Provider  aspirin EC 81 MG EC tablet Take 1 tablet (81 mg total) by mouth daily. 04/30/17   Cleora Fleet, MD  aspirin-acetaminophen-caffeine (EXCEDRIN MIGRAINE) [phone] MG tablet Take 2 tablets by mouth every 8 (eight) hours as needed for headache.    [provider]  atorvastatin (LIPITOR) 40 MG tablet  Take 80 mg by mouth at bedtime.    [provider]  feeding supplement, GLUCERNA SHAKE, (GLUCERNA SHAKE) LIQD Take 237 mLs by mouth 2 (two) times daily between meals. 04/30/17   Johnson, Clanford L, MD  furosemide (LASIX) 40 MG tablet Take 1 tablet (40 mg total) by mouth daily. 08/05/17   Hongalgi, Maximino Greenland, MD  gabapentin (NEURONTIN) 100 MG capsule Take 1 capsule (100 mg total) by mouth 2 (two) times daily. 08/04/17   Elease Etienne, MD  isosorbide mononitrate (IMDUR) 30 MG 24 hr tablet Take 2 tablets (60 mg total) daily by mouth. 07/01/17   Arty Baumgartner, NP  lisinopril (PRINIVIL,ZESTRIL) 2.5 MG tablet Take 1 tablet (2.5 mg total) daily by mouth. 07/02/17   Arty Baumgartner, NP  metFORMIN (GLUCOPHAGE) 500 MG tablet Take 1,000 mg by mouth 2 (two) times daily with a meal.    [provider]  metoprolol tartrate (LOPRESSOR) 50 MG tablet Take 1 tablet (50 mg total) 2 (two) times daily by mouth. 07/01/17   Arty Baumgartner, NP  mirtazapine (REMERON) 15 MG tablet Take 15 mg by mouth at bedtime.    [provider]  Multiple Vitamin (MULTIVITAMIN WITH MINERALS) TABS tablet Take 1 tablet by mouth daily. 04/30/17   Johnson, Clanford L, MD  mupirocin ointment (BACTROBAN) 2 % Apply 1 application topically 3 (three) times daily. 10/22/17   Elvina Sidle, MD  nicotine (NICODERM CQ - DOSED IN MG/24 HOURS) 14 mg/24hr patch Place 1 patch (14 mg total) onto the skin daily. 04/30/17   Johnson, Clanford L, MD  nitroGLYCERIN (NITROSTAT) 0.4 MG SL tablet Place 1 tablet (0.4 mg total) under the tongue every 5 (five) minutes x 3 doses as needed for chest pain. 04/29/17   Johnson, Clanford L, MD  oxyCODONE (OXY IR/ROXICODONE) 5 MG immediate release tablet Take 1 tablet (5 mg total) by mouth every 6 (six) hours as needed for moderate pain. 08/04/17   Hongalgi, Maximino Greenland, MD  sertraline (ZOLOFT) 100 MG tablet Take 100 mg by mouth daily. DEPRESSION/ANXIETY    [provider]     Family History Family History  Problem Relation Age of Onset  . Hypertension Sister     Social History Social History   Tobacco Use  . Smoking status: Current Every Day Smoker    Packs/day: 1.00    Types: Cigarettes  . Smokeless tobacco: Never Used  Substance Use Topics  . Alcohol use: No  . Drug use: No     Allergies   Patient has no known allergies.   Review of Systems Review of Systems  Skin: Positive for wound.  All other systems reviewed and are negative.    Physical Exam Updated Vital Signs BP (!) 150/81   Pulse 76   Temp 98.9 F (37.2 C) (Oral)   Resp 18   SpO2 92%   Physical Exam  Constitutional: He is oriented to  person, place, and time.  Chronically ill   HENT:  Head: Normocephalic.  Mouth/Throat: Oropharynx is clear and moist.  Eyes: Pupils are equal, round, and reactive to light. Conjunctivae and EOM are normal.  Neck: Normal range of motion. Neck supple.  Cardiovascular: Normal rate, regular rhythm and normal heart sounds.  Pulmonary/Chest: Effort normal and breath sounds normal. No stridor. No respiratory distress.  Abdominal: Soft. Bowel sounds are normal. He exhibits no distension. There is no tenderness. There is no guarding.  Musculoskeletal:  Discharge and purulent drainage from R big and 2nd toe, palpable DP pulse, discharge from L big toe with palpable DP pulse.   Neurological: He is alert and oriented to person, place, and time.  Skin: Skin is warm.  Psychiatric: He has a normal mood and affect.  Nursing note and vitals reviewed.    ED Treatments / Results  Labs (all labs ordered are listed, but only abnormal results are displayed) Labs Reviewed  CBC WITH DIFFERENTIAL/PLATELET - Abnormal; Notable for the following components:      Result Value   WBC 16.6 (*)    Hemoglobin 11.5 (*)    HCT 35.7 (*)    MCH 25.8 (*)    RDW 16.8 (*)    Neutro Abs 13.2 (*)    Monocytes Absolute 1.2 (*)    All other components within normal  limits  COMPREHENSIVE METABOLIC PANEL - Abnormal; Notable for the following components:   Glucose, Bld 186 (*)    BUN 35 (*)    Creatinine, Ser 1.49 (*)    Calcium 8.3 (*)    Total Protein 6.0 (*)    Albumin 2.4 (*)    Alkaline Phosphatase 209 (*)    GFR calc non Af Amer 47 (*)    GFR calc Af Amer 55 (*)    All other components within normal limits  PROTIME-INR - Abnormal; Notable for the following components:   Prothrombin Time 16.2 (*)    All other components within normal limits  CULTURE, BLOOD (ROUTINE X 2)  CULTURE, BLOOD (ROUTINE X 2)  I-STAT CG4 LACTIC ACID, ED  I-STAT TROPONIN, ED  I-STAT CG4 LACTIC ACID, ED  TYPE AND SCREEN  ABO/RH    EKG None  Radiology Dg Chest 1 View  Result Date: 12/25/2017 CLINICAL DATA:  Proper evaluation for foot amputation, history hypertension, type II diabetes mellitus, stroke, coronary artery disease, smoker EXAM: CHEST  1 VIEW COMPARISON:  Portable exam 1713 hours compared to 07/31/2017 FINDINGS: Upper normal size of cardiac silhouette post CABG. Mediastinal contours and pulmonary vascularity normal. Skin folds project over the lungs bilaterally. Lungs clear. No acute infiltrate, pleural effusion or pneumothorax. Bones unremarkable. IMPRESSION: Post CABG. No acute abnormalities. Electronically Signed   By: Ulyses Southward M.D.   On: 12/25/2017 18:42   Dg Foot Complete Left  Result Date: 12/25/2017 CLINICAL DATA:  Necrotic wounds at both feet EXAM: LEFT FOOT - COMPLETE 3+ VIEW COMPARISON:  10/29/2017 FINDINGS: Mild osseous demineralization. Joint spaces preserved. No acute fracture, dislocation, or bone destruction. Soft tissues unremarkable. IMPRESSION: No acute abnormalities. Electronically Signed   By: Ulyses Southward M.D.   On: 12/25/2017 18:38   Dg Foot Complete Right  Result Date: 12/25/2017 CLINICAL DATA:  Necrotic wounds at both feet EXAM: RIGHT FOOT COMPLETE - 3+ VIEW COMPARISON:  10/29/2017 FINDINGS: Osseous demineralization. Advanced  degenerative changes at first MTP joint. Mild degenerative changes at IP joint great toe. Remaining joint spaces fairly well preserved. No acute fracture, dislocation, or  bone destruction. Soft tissues radiographically unremarkable. 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 No acute bony abnormalities. Electronically Signed   By: Ulyses Southward M.D.   On: 12/25/2017 18:39    Procedures Procedures (including critical care time)  Medications Ordered in ED Medications  sodium chloride 0.9 % bolus 1,000 mL (0 mLs Intravenous Stopped 12/25/17 1813)  vancomycin (VANCOCIN) IVPB 1000 mg/200 mL premix (0 mg Intravenous Stopped 12/25/17 1915)  piperacillin-tazobactam (ZOSYN) IVPB 3.375 g (0 g Intravenous Stopped 12/25/17 1813)     Initial Impression / Assessment and Plan / ED Course  I have reviewed the triage vital signs and the nursing notes.  Pertinent labs & imaging results that were available during my care of the patient were reviewed by me and considered in my medical decision making (see chart for details).     Jordan Gardner is a 67 y.o. male here with bilateral toe discharge and drainage. I am concerned for possible osteomyelitis vs gangrenous toes. He has bilateral feet wounds that is getting worse and plan to get fem/pop bypass and bilateral amputations by Dr. Rubye Oaks. He has palpable DP pulses currently so doesn't need emergent vascular surgery. 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 Will do sepsis workup, preop labs. Will likely need hospitalist admission and vascular consult.   7:26 PM  WBC 16. Lactate nl. Xray showed no obvious osteo. I called Dr. Randie Heinz from vascular, who recommend IV abx and hospitalist admit and transfer to [facility]. He will see patient and plan for fem/pop bypass and amputation.    Final Clinical Impressions(s) / ED Diagnoses   Final diagnoses:  None    ED  Discharge Orders    None       Charlynne Pander, MD 12/25/17 [facility]en[facility]

## 2017-12-25 NOTE — Consult Note (Signed)
Hospital Consult    Reason for Consult:  Gangrene bilateral feet Referring Physician:  ED (Dr. Silverio Lay) MRN #:  [id number]  History of Present Illness: This is a 67 y.o. male well-known to vascular surgery for chronic leg pain with a long history of smoking.  He has previous coronary revascularization with harvest of his right greater saphenous vein.  He is also had LIMA graft and stenting of his left subclavian artery.  At last visit the plan was for cardiac clearance and discussion of possible left fem-bk pop bypass and tma. He now presents with worsening pain in his right worse than left foot. Denies fevers. Takes asa and statin. He is able to ambulate with assistance but is limited by pain.  Past Medical History:  Diagnosis Date  . Anxiety   . Arthritis   . Asthma   . Coronary artery disease   . Depression   . Diabetes mellitus without complication (HCC)    type 2  . Headache   . Hyperlipidemia   . Hypertension   . Myocardial infarction (HCC)   . Neuromuscular disorder (HCC)   . PAD (peripheral artery disease) (HCC)   . Protein calorie malnutrition (HCC) 04/2017  . Psychotic affective disorder (HCC)    Per sister. Unsure of Dx. Pt sleeps late and difficult to wake. odd affect  . Sacral decubitus ulcer 04/2017  . Stroke [facility])    left side weakness    Past Surgical History:  Procedure Laterality Date  . ABDOMINAL AORTOGRAM W/LOWER EXTREMITY N/A 06/08/2017   Procedure: ABDOMINAL AORTOGRAM W/LOWER EXTREMITY;  Surgeon: Chuck Hint, MD;  Location: [facility] INVASIVE CV LAB;  Service: Cardiovascular;  Laterality: N/A;  . BACK SURGERY    . CARDIAC CATHETERIZATION    . CARDIAC SURGERY     CABG x5 2012 - [facility]  . CAROTID-SUBCLAVIAN BYPASS GRAFT Left 07/23/2017   Procedure: BYPASS SUBCLAVIAN ARTERY  USING VIABAHN GORE STENT GRAFT;  Surgeon: Chuck Hint, MD;  Location: [facility] OR;  Service: Vascular;  Laterality: Left;  . LEFT HEART CATH AND CORS/GRAFTS ANGIOGRAPHY N/A  06/26/2017   Procedure: LEFT HEART CATH AND CORS/GRAFTS ANGIOGRAPHY;  Surgeon: Tonny Bollman, MD;  Location: [facility] INVASIVE CV LAB;  Service: Cardiovascular;  Laterality: N/A;    No Known Allergies  Prior to Admission medications   Medication Sig Start Date End Date Taking? Authorizing Provider  aspirin EC 81 MG EC tablet Take 1 tablet (81 mg total) by mouth daily. 04/30/17  Yes Johnson, Clanford L, MD  aspirin-acetaminophen-caffeine (EXCEDRIN MIGRAINE) [phone] MG tablet Take 2 tablets by mouth every 8 (eight) hours as needed for headache.    [provider]  atorvastatin (LIPITOR) 40 MG tablet Take 80 mg by mouth at bedtime.    [provider]  feeding supplement, GLUCERNA SHAKE, (GLUCERNA SHAKE) LIQD Take 237 mLs by mouth 2 (two) times daily between meals. 04/30/17   Johnson, Clanford L, MD  furosemide (LASIX) 40 MG tablet Take 1 tablet (40 mg total) by mouth daily. 08/05/17   Hongalgi, Maximino Greenland, MD  gabapentin (NEURONTIN) 100 MG capsule Take 1 capsule (100 mg total) by mouth 2 (two) times daily. 08/04/17   Elease Etienne, MD  isosorbide mononitrate (IMDUR) 30 MG 24 hr tablet Take 2 tablets (60 mg total) daily by mouth. 07/01/17   Arty Baumgartner, NP  lisinopril (PRINIVIL,ZESTRIL) 2.5 MG tablet Take 1 tablet (2.5 mg total) daily by mouth. 07/02/17   Arty Baumgartner, NP  metFORMIN (GLUCOPHAGE) 500 MG  tablet Take 1,000 mg by mouth 2 (two) times daily with a meal.    [provider]  metoprolol tartrate (LOPRESSOR) 50 MG tablet Take 1 tablet (50 mg total) 2 (two) times daily by mouth. 07/01/17   Arty Baumgartner, NP  mirtazapine (REMERON) 15 MG tablet Take 15 mg by mouth at bedtime.    [provider]  Multiple Vitamin (MULTIVITAMIN WITH MINERALS) TABS tablet Take 1 tablet by mouth daily. 04/30/17   Johnson, Clanford L, MD  mupirocin ointment (BACTROBAN) 2 % Apply 1 application topically 3 (three) times daily. 10/22/17   Elvina Sidle, MD  nicotine  (NICODERM CQ - DOSED IN MG/24 HOURS) 14 mg/24hr patch Place 1 patch (14 mg total) onto the skin daily. 04/30/17   Johnson, Clanford L, MD  nitroGLYCERIN (NITROSTAT) 0.4 MG SL tablet Place 1 tablet (0.4 mg total) under the tongue every 5 (five) minutes x 3 doses as needed for chest pain. 04/29/17   Johnson, Clanford L, MD  oxyCODONE (OXY IR/ROXICODONE) 5 MG immediate release tablet Take 1 tablet (5 mg total) by mouth every 6 (six) hours as needed for moderate pain. 08/04/17   Hongalgi, Maximino Greenland, MD  sertraline (ZOLOFT) 100 MG tablet Take 100 mg by mouth daily. DEPRESSION/ANXIETY    [provider]    Social History   Socioeconomic History  . Marital status: Single    Spouse name: Not on file  . Number of children: Not on file  . Years of education: Not on file  . Highest education level: Not on file  Occupational History  . Not on file  Social Needs  . Financial resource strain: Not on file  . Food insecurity:    Worry: Not on file    Inability: Not on file  . Transportation needs:    Medical: Not on file    Non-medical: Not on file  Tobacco Use  . Smoking status: Current Every Day Smoker    Packs/day: 1.00    Types: Cigarettes  . Smokeless tobacco: Never Used  Substance and Sexual Activity  . Alcohol use: No  . Drug use: No  . Sexual activity: Not on file  Lifestyle  . Physical activity:    Days per week: Not on file    Minutes per session: Not on file  . Stress: Not on file  Relationships  . Social connections:    Talks on phone: Not on file    Gets together: Not on file    Attends religious service: Not on file    Active member of club or organization: Not on file    Attends meetings of clubs or organizations: Not on file    Relationship status: Not on file  . Intimate partner violence:    Fear of current or ex partner: Not on file    Emotionally abused: Not on file    Physically abused: Not on file    Forced sexual activity: Not on file  Other Topics  Concern  . Not on file  Social History Narrative  . Not on file    Family History  Problem Relation Age of Onset  . Hypertension Sister     ROS:  Positive    Negative    All sytems reviewed and are negative  Cardiovascular:  chest pain/pressure  palpitations  SOB lying flat  DOE  pain in legs while walking  pain in legs at rest  pain in legs at night  non-healing ulcers  hx  of DVT  swelling in legs  Pulmonary:  productive cough  asthma/wheezing  home O2  Neurologic:  weakness in  arms  legs  numbness in  arms  legs  hx of CVA  mini stroke difficulty speaking or slurred speech  temporary loss of vision in one eye  dizziness  Hematologic:  hx of cancer  bleeding problems  problems with blood clotting easily  Endocrine:    diabetes  thyroid disease  GI  vomiting blood  blood in stool  GU:  CKD/renal failure  HD--[]  M/W/F or  T/T/S  burning with urination  blood in urine  Psychiatric:  anxiety  depression  Musculoskeletal:  arthritis  joint pain  Integumentary:  rashes  ulcers  Constitutional:  fever  chills   Physical Examination  Vitals:   12/25/17 1900 12/25/17 1925  BP: (!) 150/81 (!) 150/81  Pulse: 76 77  Resp:  18  Temp:  98.5 F (36.9 C)  SpO2: 92% 100%   There is no height or weight on file to calculate BMI.  General:  WDWN in NAD HENT: WNL, normocephalic Pulmonary: normal non-labored breathing Cardiac: 1+ palpable right femoral pulse 2+ left femoral pulse Non popliteal pulses palpable left brachial and radial pulses Abdomen:  soft, NT/ND, no masses Musculoskeletal:     Neurologic: A&O X 3;  SENSATION: normal; MOTOR FUNCTION:  moving all extremities equally. Speech is fluent/normal Psychiatric: Appropriate Affect and mood  CBC    Component Value Date/Time   WBC 16.6 (H) 12/25/2017 1735   RBC 4.46 12/25/2017 1735     HGB 11.5 (L) 12/25/2017 1735   HCT 35.7 (L) 12/25/2017 1735   PLT 243 12/25/2017 1735   MCV 80.0 12/25/2017 1735   MCH 25.8 (L) 12/25/2017 1735   MCHC 32.2 12/25/2017 1735   RDW 16.8 (H) 12/25/2017 1735   LYMPHSABS 2.0 12/25/2017 1735   MONOABS 1.2 (H) 12/25/2017 1735   EOSABS 0.2 12/25/2017 1735   BASOSABS 0.0 12/25/2017 1735    BMET    Component Value Date/Time   NA 136 12/25/2017 1735   K 4.8 12/25/2017 1735   CL 105 12/25/2017 1735   CO2 22 12/25/2017 1735   GLUCOSE 186 (H) 12/25/2017 1735   BUN 35 (H) 12/25/2017 1735   CREATININE 1.49 (H) 12/25/2017 1735   CALCIUM 8.3 (L) 12/25/2017 1735   GFRNONAA 47 (L) 12/25/2017 1735   GFRAA 55 (L) 12/25/2017 1735    COAGS: Lab Results  Component Value Date   INR 1.31 12/25/2017   INR 1.35 07/28/2017   INR 1.24 07/23/2017       ASSESSMENT/PLAN: This is a 67 y.o. male with bilateral toe gangrene that is worsening per patient and his family. I do not appreciate any wet components and so no need for urgent revascularization. Will be admitted to hospitalists at [facility] and will begin workup for left femoral-popliteal bypass with vein mapping of ble and ABI's. Continue aspirin and statin. Ok for subcutaneous heparin but would avoid ted hose/scd's. Betadine paint to effected toes. Surgical plans to follow but likely next week.   Lynden Carrithers C. Randie Heinz, MD [facility] Office: [phone] Pager: [phone]

## 2017-12-26 ENCOUNTER — Other Ambulatory Visit: Payer: Self-pay

## 2017-12-26 DIAGNOSIS — D72829 Elevated white blood cell count, unspecified: Secondary | ICD-10-CM | POA: Insufficient documentation

## 2017-12-26 DIAGNOSIS — I10 Essential (primary) hypertension: Secondary | ICD-10-CM | POA: Diagnosis present

## 2017-12-26 LAB — ABO/RH: ABO/RH(D): A POS

## 2017-12-26 LAB — BASIC METABOLIC PANEL
Anion gap: 10 (ref 5–15)
BUN: 33 mg/dL — AB (ref 6–20)
CALCIUM: 8.3 mg/dL — AB (ref 8.9–10.3)
CO2: 20 mmol/L — AB (ref 22–32)
CREATININE: 1.28 mg/dL — AB (ref 0.61–1.24)
Chloride: 106 mmol/L (ref 101–111)
GFR calc non Af Amer: 57 mL/min — ABNORMAL LOW (ref >60)
GLUCOSE: 151 mg/dL — AB (ref 65–99)
Potassium: 4.6 mmol/L (ref 3.5–5.1)
Sodium: 136 mmol/L (ref 135–145)

## 2017-12-26 LAB — CBC
HEMATOCRIT: 35.3 % — AB (ref 39.0–52.0)
Hemoglobin: 11.3 g/dL — ABNORMAL LOW (ref 13.0–17.0)
MCH: 25.6 pg — AB (ref 26.0–34.0)
MCHC: 32 g/dL (ref 30.0–36.0)
MCV: 80 fL (ref 78.0–100.0)
Platelets: 246 10*3/uL (ref 150–400)
RBC: 4.41 MIL/uL (ref 4.22–5.81)
RDW: 17 % — AB (ref 11.5–15.5)
WBC: 15.7 10*3/uL — ABNORMAL HIGH (ref 4.0–10.5)

## 2017-12-26 LAB — APTT: aPTT: 45 seconds — ABNORMAL HIGH (ref 24–36)

## 2017-12-26 LAB — CBG MONITORING, ED
GLUCOSE-CAPILLARY: 155 mg/dL — AB (ref 65–99)
Glucose-Capillary: 108 mg/dL — ABNORMAL HIGH (ref 65–99)
Glucose-Capillary: 131 mg/dL — ABNORMAL HIGH (ref 65–99)
Glucose-Capillary: 162 mg/dL — ABNORMAL HIGH (ref 65–99)

## 2017-12-26 LAB — MAGNESIUM: Magnesium: 1.7 mg/dL (ref 1.7–2.4)

## 2017-12-26 LAB — GLUCOSE, CAPILLARY
GLUCOSE-CAPILLARY: 90 mg/dL (ref 65–99)
GLUCOSE-CAPILLARY: 95 mg/dL (ref 65–99)
GLUCOSE-CAPILLARY: 83 mg/dL (ref 65–99)

## 2017-12-26 LAB — PROTIME-INR
INR: 1.34
Prothrombin Time: 16.5 seconds — ABNORMAL HIGH (ref 11.4–15.2)

## 2017-12-26 MED ORDER — NICOTINE 21 MG/24HR TD PT24
21.0000 mg | MEDICATED_PATCH | Freq: Every day | TRANSDERMAL | Status: DC
  Administered 2017-12-26 – 2018-01-01 (×7): 21 mg via TRANSDERMAL
  Filled 2017-12-26 (×7): qty 1

## 2017-12-26 NOTE — ED Notes (Signed)
Report has been given to RN Aneshia at [facility]M.

## 2017-12-26 NOTE — Progress Notes (Signed)
PROGRESS NOTE    Jordan Gardner  WUJ:[id number] DOB: 1951/01/09 DOA: 12/25/2017 PCP: [facility]    Brief Narrative: 67 y.o. male with medical history significant of HTN, HLD, severe PVD, CAD s/p CABG, CVA with residual left-sided weakness, DM type II, and tobacco abuse; who presents with complaints of worsening bilateral foot pain.    Pt presenting with gangrene of toes BL. Vascular surgery consulted.   Assessment & Plan:   Principal Problem:   Gangrene of foot (HCC) - Vascular surgeon to take to OR for further evaluation and recommendations - continue supportive therapy - tx to 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  Objective: Vitals:   12/26/17 0930 12/26/17 1000 12/26/17 1030 12/26/17 1130  BP: (!) 149/72 (!) 146/82 (!) 157/79 (!) 156/70  Pulse: 78 78 76 80  Resp: 20 19 (!) 27 18  Temp:      TempSrc:      SpO2: 95% 100% 94% 97%   No intake or output data in the 24 hours ending 12/26/17 1216 There were no vitals filed for this visit.  Examination:  General exam: Appears calm and comfortable  Respiratory system: Clear to auscultation. Respiratory effort normal. Cardiovascular system: S1 & S2 heard, RRR. No JVD, murmurs, rubs, gallops or clicks. No pedal edema. Gastrointestinal system: Abdomen is nondistended, soft and nontender. No  organomegaly or masses felt. Normal bowel sounds heard. Central nervous system: Alert and oriented. No facial asymmetry Extremities: Symmetric 5 x 5 power. Skin: Pt with necrotic BL toes with foul odor. Please see image on HPI Psychiatry:Mood & affect appropriate.     Data Reviewed: I have personally reviewed following labs and imaging studies  CBC: Recent Labs  Lab 12/25/17 1735 12/26/17 0531  WBC 16.6* 15.7*  NEUTROABS 13.2*  --   HGB 11.5* 11.3*  HCT 35.7* 35.3*  MCV 80.0 80.0  PLT 243 246   Basic Metabolic Panel: Recent Labs  Lab 12/25/17 1735 12/26/17 0531  NA 136 136  K 4.8 4.6  CL 105 106  CO2 22 20*  GLUCOSE 186* 151*  BUN 35* 33*  CREATININE 1.49* 1.28*  CALCIUM 8.3* 8.3*  MG  --  1.7   GFR: Estimated Creatinine Clearance: 56.5 mL/min (A) (by C-G formula based on SCr of 1.28 mg/dL (H)). Liver Function Tests: Recent Labs  Lab 12/25/17 1735  AST 32  ALT 28  ALKPHOS 209*  BILITOT 0.6  PROT 6.0*  ALBUMIN 2.4*   No results for input(s): LIPASE, AMYLASE in the last 168 hours. No results for input(s): AMMONIA in the last 168 hours. Coagulation Profile: Recent Labs  Lab 12/25/17 1735 12/26/17 0531  INR 1.31 1.34   Cardiac Enzymes: No results for input(s): CKTOTAL, CKMB, CKMBINDEX, TROPONINI in the last 168 hours. BNP (last 3 results) No results for input(s): PROBNP in the last 8760 hours. HbA1C: No results for input(s): HGBA1C  in the last 72 hours. CBG: Recent Labs  Lab 12/26/17 0003 12/26/17 0449 12/26/17 0736 12/26/17 1142  GLUCAP 162* 155* 131* 108*   Lipid Profile: No results for input(s): CHOL, HDL, LDLCALC, TRIG, CHOLHDL, LDLDIRECT in the last 72 hours. Thyroid Function Tests: No results for input(s): TSH, T4TOTAL, FREET4, T3FREE, THYROIDAB in the last 72 hours. Anemia Panel: No results for input(s): VITAMINB12, FOLATE, FERRITIN, TIBC, IRON, RETICCTPCT in the last 72 hours. Sepsis Labs: Recent Labs  Lab 12/25/17 1726  12/25/17 1849  LATICACIDVEN 1.05 1.33    Recent Results (from the past 240 hour(s))  Blood culture (routine x 2)     Status: None (Preliminary result)   Collection Time: 12/25/17  5:35 PM  Result Value Ref Range Status   Specimen Description   Final    BLOOD LEFT FOREARM Performed at [facility], [address], [place], [place] [address]    Special Requests   Final    BOTTLES DRAWN AEROBIC AND ANAEROBIC Blood Culture adequate volume Performed at [facility], [address], [place], [place] [address]    Culture   Final    NO GROWTH < 24 HOURS Performed at [facility], [address], [place], [place] [address]    Report Status PENDING  Incomplete  Blood culture (routine x 2)     Status: None (Preliminary result)   Collection Time: 12/25/17  5:35 PM  Result Value Ref Range Status   Specimen Description   Final    BLOOD RIGHT FOREARM Performed at [facility], [address], [place], [place] [address]    Special Requests   Final    BOTTLES DRAWN AEROBIC AND ANAEROBIC Blood Culture adequate volume Performed at [facility], [address] [place], [place] [address]    Culture   Final    NO GROWTH < 24 HOURS Performed at [facility], [address], [place], [place] [address]    Report Status PENDING  Incomplete         Radiology Studies: Dg Chest 1 View  Result Date: 12/25/2017 CLINICAL DATA:  Proper evaluation for foot amputation, history hypertension, type II diabetes mellitus, stroke, coronary artery disease, smoker EXAM: CHEST  1 VIEW COMPARISON:  Portable exam 1713 hours compared to 07/31/2017 FINDINGS: Upper normal size of cardiac silhouette post CABG. Mediastinal contours and pulmonary vascularity normal. Skin folds project over the lungs bilaterally. Lungs clear. No acute infiltrate, pleural effusion or pneumothorax. Bones unremarkable. IMPRESSION: Post CABG. No acute  abnormalities. Electronically Signed   By: Ulyses Southward M.D.   On: 12/25/2017 18:42   Dg Foot Complete Left  Result Date: 12/25/2017 CLINICAL DATA:  Necrotic wounds at both feet EXAM: LEFT FOOT - COMPLETE 3+ VIEW COMPARISON:  10/29/2017 FINDINGS: Mild osseous demineralization. Joint spaces preserved. No acute fracture, dislocation, or bone destruction. Soft tissues unremarkable. IMPRESSION: No acute abnormalities. Electronically Signed   By: Ulyses Southward M.D.   On: 12/25/2017 18:38   Dg Foot Complete Right  Result Date: 12/25/2017 CLINICAL DATA:  Necrotic wounds at both feet EXAM: RIGHT FOOT COMPLETE - 3+ VIEW COMPARISON:  10/29/2017 FINDINGS: Osseous demineralization. Advanced degenerative changes at first MTP joint. Mild degenerative changes at IP joint great toe. Remaining joint spaces fairly well preserved. No acute fracture, dislocation, or bone destruction. Soft tissues radiographically unremarkable. 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 No acute bony  abnormalities. Electronically Signed   By: Ulyses Southward M.D.   On: 12/25/2017 18:39        Scheduled Meds: . aspirin EC  81 mg Oral Daily  . atorvastatin  80 mg Oral QHS  . enoxaparin (LOVENOX) injection  40 mg Subcutaneous QHS  . gabapentin  100 mg Oral BID  . insulin aspart  0-5 Units Subcutaneous QHS  . insulin aspart  0-9 Units Subcutaneous TID WC  . isosorbide mononitrate  60 mg Oral Daily  . mupirocin ointment  1 application Topical TID  . nicotine  21 mg Transdermal Daily   Continuous Infusions: . sodium chloride 75 mL/hr at 12/26/17 0015     LOS: 1 day    Time spent: 10 min    Penny Pia, MD [facility] Pager [phone]  If 7PM-7AM, please contact night-coverage [email or web address] Password [facility] 12/26/2017, 12:16 PM

## 2017-12-26 NOTE — ED Notes (Signed)
[facility] has been called and will be in route to facility ASAP.

## 2017-12-26 NOTE — Progress Notes (Signed)
New Admission Note:   Arrival Method: Stretcher Mental Orientation: A&O X4 Telemetry: Initiated Assessment: Completed Skin: See Flowsheets IV: WDL Pain: 6/10 Tubes: Condom Catheter Placed Safety Measures: Safety Fall Prevention Plan has been given, discussed and signed Admission: Completed Unit Orientation: Patient has been orientated to the room, unit and staff.   Orders have been reviewed and implemented. Will continue to monitor the patient. Call light has been placed within reach and bed alarm has been activated.    Britt Bolognese RN, BSN

## 2017-12-26 NOTE — ED Notes (Signed)
EKG given to EDP,Nanavati,MD. For review. 

## 2017-12-26 NOTE — ED Notes (Signed)
Called 72[facility] at [facility] and spoke with Diplomatic Services operational officer. Secretary stated that RN has stepped off floor but will call RN at [facility] ASAP at [phone].

## 2017-12-27 ENCOUNTER — Inpatient Hospital Stay (HOSPITAL_COMMUNITY): Payer: Medicare HMO

## 2017-12-27 DIAGNOSIS — Z01810 Encounter for preprocedural cardiovascular examination: Secondary | ICD-10-CM

## 2017-12-27 DIAGNOSIS — I96 Gangrene, not elsewhere classified: Secondary | ICD-10-CM

## 2017-12-27 LAB — GLUCOSE, CAPILLARY
GLUCOSE-CAPILLARY: 132 mg/dL — AB (ref 65–99)
GLUCOSE-CAPILLARY: 158 mg/dL — AB (ref 65–99)
Glucose-Capillary: 177 mg/dL — ABNORMAL HIGH (ref 65–99)
Glucose-Capillary: 191 mg/dL — ABNORMAL HIGH (ref 65–99)

## 2017-12-27 LAB — SURGICAL PCR SCREEN
MRSA, PCR: NEGATIVE
STAPHYLOCOCCUS AUREUS: NEGATIVE

## 2017-12-27 MED ORDER — ONDANSETRON HCL 4 MG/2ML IJ SOLN
4.0000 mg | Freq: Four times a day (QID) | INTRAMUSCULAR | Status: DC
  Administered 2017-12-27 – 2017-12-28 (×3): 4 mg via INTRAVENOUS
  Filled 2017-12-27 (×3): qty 2

## 2017-12-27 MED ORDER — PIPERACILLIN-TAZOBACTAM 3.375 G IVPB
3.3750 g | Freq: Three times a day (TID) | INTRAVENOUS | Status: DC
  Administered 2017-12-27 – 2017-12-31 (×12): 3.375 g via INTRAVENOUS
  Filled 2017-12-27 (×13): qty 50

## 2017-12-27 MED ORDER — VANCOMYCIN HCL IN DEXTROSE 750-5 MG/150ML-% IV SOLN
750.0000 mg | Freq: Two times a day (BID) | INTRAVENOUS | Status: DC
  Administered 2017-12-28 – 2017-12-29 (×5): 750 mg via INTRAVENOUS
  Filled 2017-12-27 (×6): qty 150

## 2017-12-27 MED ORDER — MAGNESIUM CITRATE PO SOLN
1.0000 | Freq: Once | ORAL | Status: AC
  Administered 2017-12-27: 1 via ORAL
  Filled 2017-12-27: qty 296

## 2017-12-27 MED ORDER — VANCOMYCIN HCL 10 G IV SOLR
1500.0000 mg | Freq: Once | INTRAVENOUS | Status: AC
  Administered 2017-12-27: 1500 mg via INTRAVENOUS
  Filled 2017-12-27: qty 1500

## 2017-12-27 MED ORDER — POVIDONE-IODINE 10 % EX SOLN
CUTANEOUS | Status: DC | PRN
  Filled 2017-12-27: qty 118

## 2017-12-27 NOTE — Progress Notes (Signed)
Pharmacy Antibiotic Note  Jordan Gardner is a 67 y.o. male admitted on 12/25/2017 with wound infection.  Pharmacy has been consulted for vancomycin and Zosyn dosing. CrCl~60.  Plan: Vancomycin 1,500 mg IV loading dose Vancomycin 750 mg IV every 12 hours.  Goal trough 15-20 mcg/mL. Zosyn 3.375g IV q8h (4 hour infusion).  F/U renal function, cultures, LOT, and trough as needed.  Height:  (185.4 cm) Weight: 163 lb 9.3 oz (74.2 kg) IBW/kg (Calculated) : 79.9  Temp (24hrs), Avg:98.3 F (36.8 C), Min:98 F (36.7 C), Max:98.9 F (37.2 C)  Recent Labs  Lab 12/25/17 1726 12/25/17 1735 12/25/17 1849 12/26/17 0531  WBC  --  16.6*  --  15.7*  CREATININE  --  1.49*  --  1.28*  LATICACIDVEN 1.05  --  1.33  --     Estimated Creatinine Clearance: 59.6 mL/min (A) (by C-G formula based on SCr of 1.28 mg/dL (H)).    No Known Allergies  Thank you for allowing pharmacy to be a part of this patient's care.  Ladell Pier, PharmD Pharmacy Resident Clinical Phone for 12/27/2017 until 3:30pm: [phone] If after 3:30pm, please call main pharmacy at [phone] 12/27/2017 10:28 AM

## 2017-12-27 NOTE — Progress Notes (Signed)
Right Lower Extremity Vein Map    Right Great Saphenous Vein   Segment Diameter Comment  1. Origin 3.37mm   2. High Thigh 2.57mm branch  3. Mid Thigh 2.16mm branch  4. Low Thigh 1.42mm   5. At Knee 1.82mm   6. High Calf 1.54mm   7. Low Calf 1.43mm   8. Ankle 1.43mm    mm    mm    mm      Left Lower Extremity Vein Map    Left Great Saphenous Vein   Segment Diameter Comment  1. Origin 3.53mm   2. High Thigh 2.38mm   3. Mid Thigh 2.50mm   4. Low Thigh 1.66mm   5. At Knee 4.35mm   6. High Calf 3.50mm branch  7. Low Calf 2.78mm branch  8. Ankle 2.89mm    mm    mm    mm

## 2017-12-27 NOTE — Progress Notes (Signed)
VASCULAR LAB PRELIMINARY  ARTERIAL  ABI completed: Bilateral ABIs indicate severe reduction in arterial blood flow.  Unable to ascertain right great toe pressure secondary to gangrene.  Left great toe pressure is abnormal.     RIGHT    LEFT    PRESSURE WAVEFORM  PRESSURE WAVEFORM  BRACHIAL 172 T BRACHIAL 161 T  DP 71 DM DP 80   AT   AT    PT 66 DM PT 60   PER   PER    GREAT TOE gangrene NA GREAT TOE 0.40 NA    RIGHT LEFT  ABI 0.41 0.47     Aleena Kirkeby, RVT 12/27/2017, 5:23 PM

## 2017-12-27 NOTE — Progress Notes (Signed)
Received verbal order from Dr.R K Rai to order a one time dose of Magnesium Citrate for [name]

## 2017-12-27 NOTE — Progress Notes (Signed)
Patient is complaining of being hungry.  Upon reviewing Physician notes it appears the plain is for BLE vein mapping and ABI in preparation for surgical intervention next week.  Called MD to see ok to initiate diet order.  Order received for Carb Mod/Heart Healthy Diet.  Will continue to monitor patient.  Bernie Covey RN-BC, [employer]

## 2017-12-27 NOTE — Progress Notes (Addendum)
  Progress Note    12/27/2017 10:16 AM   Subjective:  Pain in R more than L foot   Vitals:   12/27/17 0456 12/27/17 0938  BP: (!) 146/100 (!) 143/90  Pulse: 98 96  Resp: 18 18  Temp: 98.9 F (37.2 C) 98.1 F (36.7 C)  SpO2: 93% (!) 89%   Physical Exam: Cardiac:  RRR Lungs:  Non labored Extremities:  Palpable femoral pulses L>R; dry gangrenous toes, no wet gangrene noted Abdomen:  Soft Neurologic: A&O  CBC    Component Value Date/Time   WBC 15.7 (H) 12/26/2017 0531   RBC 4.41 12/26/2017 0531   HGB 11.3 (L) 12/26/2017 0531   HCT 35.3 (L) 12/26/2017 0531   PLT 246 12/26/2017 0531   MCV 80.0 12/26/2017 0531   MCH 25.6 (L) 12/26/2017 0531   MCHC 32.0 12/26/2017 0531   RDW 17.0 (H) 12/26/2017 0531   LYMPHSABS 2.0 12/25/2017 1735   MONOABS 1.2 (H) 12/25/2017 1735   EOSABS 0.2 12/25/2017 1735   BASOSABS 0.0 12/25/2017 1735    BMET    Component Value Date/Time   NA 136 12/26/2017 0531   K 4.6 12/26/2017 0531   CL 106 12/26/2017 0531   CO2 20 (L) 12/26/2017 0531   GLUCOSE 151 (H) 12/26/2017 0531   BUN 33 (H) 12/26/2017 0531   CREATININE 1.28 (H) 12/26/2017 0531   CALCIUM 8.3 (L) 12/26/2017 0531   GFRNONAA 57 (L) 12/26/2017 0531   GFRAA >60 12/26/2017 0531    INR    Component Value Date/Time   INR 1.34 12/26/2017 0531     Intake/Output Summary (Last 24 hours) at 12/27/2017 1016 Last data filed at 12/27/2017 0600 Gross per 24 hour  Intake 2831.25 ml  Output 700 ml  Net 2131.25 ml     Assessment/Plan:  67 y.o. male with gangrenous toes; palpable femoral pulse bilaterally  BLE ABIs pending BLE vein mapping pending Betadine paint all toes ordered Plan for revascularization this week pending the above  DVT prophylaxis:  subq heparin   Emilie Rutter, PA-C [facility] [phone] 12/27/2017 10:16 AM  I have independently interviewed and examined patient and agree with PA assessment and plan above.  Vein mapping pending  bilateral lower extremities.  Continue Betadine paint to toes.  Antibiotics per primary.  Will discuss with Dr. Edilia Bo timing of revascularization.  Brandon C. Randie Heinz, MD [facility] [place] Office: [phone] Pager: [phone]

## 2017-12-27 NOTE — Progress Notes (Signed)
[facility]                                                                              Patient Demographics  Jordan Gardner, is a 67 y.o. male, DOB - 1951/07/26, ZOX:[id number]  Admit date - 12/25/2017   Admitting Physician Clydie Braun, MD  Outpatient Primary MD for the patient is [facility]  Outpatient specialists:   LOS - 2  days   Medical records reviewed and are as summarized below:    Chief Complaint  Patient presents with  . Foot Pain    bilateral       Brief summary   67 y.o.malewith medical history significant ofHTN, HLD,severePVD, CADs/p CABG, CVA with residual left-sided weakness, DM type II, and tobacco abuse;who presents with complaints of worsening bilateral foot pain and gangrene of the toes bilaterally, right worse than left.  Vascular surgery was consulted.  Patient was transferred to [facility] for vascular interventions.  Assessment & Plan    Principal Problem:   Gangrene of foot (HCC) in the setting of severe PVD, diabetes mellitus -Vascular surgery consulted, recommended vein mapping of bilateral lower extremities, ABIs and left femoral-popliteal bypass - Continue aspirin, statin -Leukocytosis, trending down, will place on empiric antibiotics until surgery or different recommendation by vascular surgery  Active Problems:   Hyperlipidemia -Continue Lipitor    Tobacco abuse disorder -Counseled on smoking cessation, placed on nicotine patch    Sacral decubitus ulcer POA -Chronic, wound care consulted    AKI (acute kidney injury) (HCC) -Presented with creatinine of 1.49, baseline 1.1 continue IV fluid hydration -Creatinine improved to 1.2  Type II diabetes mellitus, uncontrolled with hyperglycemia - Hold metformin, continue sliding scale insulin    Coronary artery disease involving coronary bypass graft of native heart with unstable angina pectoris (HCC) -Currently stable, continue aspirin,  Imdur, statin    Essential hypertension BP stable  Code Status: Full CODE STATUS DVT Prophylaxis: lovenox Family Communication: Discussed in detail with the patient, all imaging results, lab results explained to the patient    Disposition Plan:   Time Spent in minutes   35 minutes  Procedures:  None  Consultants:   Vascular surgery  Antimicrobials:   IV vancomycin 5/12  IV Zosyn 5/12   Medications  Scheduled Meds: . aspirin EC  81 mg Oral Daily  . atorvastatin  80 mg Oral QHS  . enoxaparin (LOVENOX) injection  40 mg Subcutaneous QHS  . gabapentin  100 mg Oral BID  . insulin aspart  0-5 Units Subcutaneous QHS  . insulin aspart  0-9 Units Subcutaneous TID WC  . isosorbide mononitrate  60 mg Oral Daily  . mupirocin ointment  1 application Topical TID  . nicotine  21 mg Transdermal Daily   Continuous Infusions: . sodium chloride 75 mL/hr at 12/27/17 0246   PRN Meds:.acetaminophen **OR** acetaminophen, ipratropium-albuterol, morphine injection, oxyCODONE, povidone-iodine   Antibiotics   Anti-infectives (From admission, onward)   Start     Dose/Rate Route Frequency Ordered Stop   12/25/17 1700  vancomycin (VANCOCIN) IVPB 1000 mg/200 mL premix     1,000 mg 200 mL/hr over  60 Minutes Intravenous  Once 12/25/17 1653 12/25/17 1915   12/25/17 1700  piperacillin-tazobactam (ZOSYN) IVPB 3.375 g     3.375 g 100 mL/hr over 30 Minutes Intravenous  Once 12/25/17 1653 12/25/17 1813        Subjective:   Jordan Gardner was seen and examined today.  No complaints. Patient denies dizziness, chest pain, shortness of breath, abdominal pain, N/V/D/C, new weakness, numbess, tingling. No acute events overnight.    Objective:   Vitals:   12/26/17 1553 12/26/17 2027 12/27/17 0456 12/27/17 0938  BP: (!) 164/79 (!) 152/78 (!) 146/100 (!) 143/90  Pulse: 79 81 98 96  Resp: Temp: 98 F (36.7 C) 98.3 F (36.8 C) 98.9 F (37.2 C) 98.1 F (36.7 C)  TempSrc: Oral  Oral Oral Oral  SpO2:  94% 93% (!) 89%  Weight: 72.2 kg (159 lb 2.8 oz) 74.2 kg (163 lb 9.3 oz)    Height:  (1.854 m)       Intake/Output Summary (Last 24 hours) at 12/27/2017 1017 Last data filed at 12/27/2017 0600 Gross per 24 hour  Intake 2831.25 ml  Output 700 ml  Net 2131.25 ml     Wt Readings from Last 3 Encounters:  12/26/17 74.2 kg (163 lb 9.3 oz)  12/22/17 70.4 kg (155 lb 3.2 oz)  11/25/17 74.3 kg (163 lb 14.4 oz)     Exam  General: Alert and oriented x 3, NAD  Eyes:   HEENT:  Atraumatic, normocephalic  Cardiovascular: S1 S2 auscultated,  Regular rate and rhythm.  Respiratory: Clear to auscultation bilaterally, no wheezing, rales or rhonchi  Gastrointestinal: Soft, nontender, nondistended, + bowel sounds  Ext: no pedal edema bilaterally  Neuro: no new deficits  Musculoskeletal: No digital cyanosis, clubbing  Skin: Bilateral dry gangrene of both toes, necrotic appearance with foul-smelling odor   Psych: Normal affect and demeanor, alert and oriented x3    Data Reviewed:  I have personally reviewed following labs and imaging studies  Micro Results Recent Results (from the past 240 hour(s))  Blood culture (routine x 2)     Status: None (Preliminary result)   Collection Time: 12/25/17  5:35 PM  Result Value Ref Range Status   Specimen Description   Final    BLOOD LEFT FOREARM Performed at [facility], [address] [place], [place] [address]    Special Requests   Final    BOTTLES DRAWN AEROBIC AND ANAEROBIC Blood Culture adequate volume Performed at [facility], [address], [place], [place] [address]    Culture   Final    NO GROWTH < 24 HOURS Performed at [facility], [address] [place], [place] [address]    Report Status PENDING  Incomplete  Blood culture (routine x 2)     Status: None (Preliminary result)   Collection Time: 12/25/17  5:35 PM  Result Value Ref Range Status    Specimen Description   Final    BLOOD RIGHT FOREARM Performed at [facility], [address], [place], [place] [address]    Special Requests   Final    BOTTLES DRAWN AEROBIC AND ANAEROBIC Blood Culture adequate volume Performed at [facility], [address] [place], [place] [address]    Culture   Final    NO GROWTH < 24 HOURS Performed at [facility], [address], [place], [place] [address]    Report Status PENDING  Incomplete  Surgical pcr screen     Status: None   Collection Time: 12/26/17 11:56 PM  Result Value Ref Range Status   MRSA, PCR NEGATIVE NEGATIVE Final   Staphylococcus aureus NEGATIVE NEGATIVE Final    Comment: (NOTE) The Xpert SA Assay (FDA approved for NASAL specimens in patients 8 years of age and older), is one component of a comprehensive surveillance program. It is not intended to diagnose infection nor to guide or monitor treatment. Performed at [facility], [address] [place], [place] [address]     Radiology Reports Dg Chest 1 View  Result Date: 12/25/2017 CLINICAL DATA:  Proper evaluation for foot amputation, history hypertension, type II diabetes mellitus, stroke, coronary artery disease, smoker EXAM: CHEST  1 VIEW COMPARISON:  Portable exam 1713 hours compared to 07/31/2017 FINDINGS: Upper normal size of cardiac silhouette post CABG. Mediastinal contours and pulmonary vascularity normal. Skin folds project over the lungs bilaterally. Lungs clear. No acute infiltrate, pleural effusion or pneumothorax. Bones unremarkable. IMPRESSION: Post CABG. No acute abnormalities. Electronically Signed   By: Ulyses Southward M.D.   On: 12/25/2017 18:42   Dg Foot Complete Left  Result Date: 12/25/2017 CLINICAL DATA:  Necrotic wounds at both feet EXAM: LEFT FOOT - COMPLETE 3+ VIEW COMPARISON:  10/29/2017 FINDINGS: Mild osseous demineralization. Joint spaces preserved. No acute fracture, dislocation, or bone  destruction. Soft tissues unremarkable. IMPRESSION: No acute abnormalities. Electronically Signed   By: Ulyses Southward M.D.   On: 12/25/2017 18:38   Dg Foot Complete Right  Result Date: 12/25/2017 CLINICAL DATA:  Necrotic wounds at both feet EXAM: RIGHT FOOT COMPLETE - 3+ VIEW COMPARISON:  10/29/2017 FINDINGS: Osseous demineralization. Advanced degenerative changes at first MTP joint. Mild degenerative changes at IP joint great toe. Remaining joint spaces fairly well preserved. No acute fracture, dislocation, or bone destruction. Soft tissues radiographically unremarkable. 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 No acute bony abnormalities. Electronically Signed   By: Ulyses Southward M.D.   On: 12/25/2017 18:39    Lab Data:  CBC: Recent Labs  Lab 12/25/17 1735 12/26/17 0531  WBC 16.6* 15.7*  NEUTROABS 13.2*  --   HGB 11.5* 11.3*  HCT 35.7* 35.3*  MCV 80.0 80.0  PLT 243 246   Basic Metabolic Panel: Recent Labs  Lab 12/25/17 1735 12/26/17 0531  NA 136 136  K 4.8 4.6  CL 105 106  CO2 22 20*  GLUCOSE 186* 151*  BUN 35* 33*  CREATININE 1.49* 1.28*  CALCIUM 8.3* 8.3*  MG  --  1.7   GFR: Estimated Creatinine Clearance: 59.6 mL/min (A) (by C-G formula based on SCr of 1.28 mg/dL (H)). Liver Function Tests: Recent Labs  Lab 12/25/17 1735  AST 32  ALT 28  ALKPHOS 209*  BILITOT 0.6  PROT 6.0*  ALBUMIN 2.4*   No results for input(s): LIPASE, AMYLASE in the last 168 hours. No results for input(s): AMMONIA in the last 168 hours. Coagulation Profile: Recent Labs  Lab 12/25/17 1735 12/26/17 0531  INR 1.31 1.34   Cardiac Enzymes: No results for input(s): CKTOTAL, CKMB, CKMBINDEX, TROPONINI in the last 168 hours. BNP (last 3 results) No results for input(s): PROBNP in the last 8760 hours. HbA1C: No results for input(s): HGBA1C in the last 72 hours. CBG: Recent Labs  Lab 12/26/17 1142 12/26/17 1539  12/26/17 1710 12/26/17 2151 12/27/17 0747  GLUCAP 108* 90 95 83 132*   Lipid  Profile: No results for input(s): CHOL, HDL, LDLCALC, TRIG, CHOLHDL, LDLDIRECT in the last 72 hours. Thyroid Function Tests: No results for input(s): TSH, T4TOTAL, FREET4, T3FREE, THYROIDAB in the last 72 hours. Anemia Panel: No results for input(s): VITAMINB12, FOLATE, FERRITIN, TIBC, IRON, RETICCTPCT in the last 72 hours. Urine analysis:    Component Value Date/Time   COLORURINE AMBER (A) 07/28/2017 1025   APPEARANCEUR HAZY (A) 07/28/2017 1025   LABSPEC 1.026 07/28/2017 1025   PHURINE 5.0 07/28/2017 1025   GLUCOSEU NEGATIVE 07/28/2017 1025   HGBUR NEGATIVE 07/28/2017 1025   BILIRUBINUR NEGATIVE 07/28/2017 1025   KETONESUR NEGATIVE 07/28/2017 1025   PROTEINUR >=300 (A) 07/28/2017 1025   NITRITE NEGATIVE 07/28/2017 1025   LEUKOCYTESUR NEGATIVE 07/28/2017 1025     Ripudeep Rai M.D. [facility] 12/27/2017, 10:17 AM  Pager: [phone] Between 7am to 7pm - call Pager - [phone]  After 7pm go to [email or web address] - password [email or web address]  Call night coverage person covering after 7pm

## 2017-12-28 LAB — GLUCOSE, CAPILLARY
Glucose-Capillary: 153 mg/dL — ABNORMAL HIGH (ref 65–99)
Glucose-Capillary: 163 mg/dL — ABNORMAL HIGH (ref 65–99)
Glucose-Capillary: 138 mg/dL — ABNORMAL HIGH (ref 65–99)
Glucose-Capillary: 152 mg/dL — ABNORMAL HIGH (ref 65–99)

## 2017-12-28 MED ORDER — ADULT MULTIVITAMIN W/MINERALS CH
1.0000 | ORAL_TABLET | Freq: Every day | ORAL | Status: DC
  Administered 2017-12-28 – 2018-01-01 (×5): 1 via ORAL
  Filled 2017-12-28 (×5): qty 1

## 2017-12-28 MED ORDER — ENSURE ENLIVE PO LIQD
237.0000 mL | Freq: Three times a day (TID) | ORAL | Status: DC
  Administered 2017-12-28 – 2018-01-01 (×10): 237 mL via ORAL

## 2017-12-28 NOTE — Progress Notes (Signed)
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  He is markedly debilitated and essentially bedridden.  He is undergone preoperative cardiac evaluation and was felt to be at high risk for cardiac complications.  His only options for limb salvage would be a left femoropopliteal bypass with a transmetatarsal amputation and a right axillofemoral bypass and right femoropopliteal bypass with a transmetatarsal amputation.  I think that given his cardiac risk and the extent of surgery that would be required he is a very poor candidate for surgery.  I have discussed with him this morning that the safest approach clearly is bilateral above-the-knee amputations.  I will continue to discuss this option with him.  If ultimately he decides to proceed in that direction than he was originally seen by Dr. Lajoyce Corners, and so it would be best to consult him if the patient decides to proceed with this option.  HISTORY: This is a 67 year old markedly debilitated man who I last saw in the office on 11/25/2017.  He has a complicated history as outlined in my note dated 11/25/2017.  Patient had critical limb ischemia but was felt to be high risk for surgery given his severe protein calorie malnutrition, debilitated state, and history of significant cardiac disease in addition the patient's activity is very limited and he is essentially bedridden.  As per cardiology's note dated 12/22/2017, the patient was was felt to be very high risk for cardiac complications.  However at that time the patient was still considering surgery.  SUBJECTIVE:   No specific complaints.  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 Small sacral decubitus.  No significant drainage.  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 aspirin EC  81 mg Oral Daily  . atorvastatin  80 mg Oral QHS  . enoxaparin (LOVENOX) injection  40 mg Subcutaneous QHS  . gabapentin  100 mg Oral BID  . insulin aspart  0-5 Units Subcutaneous QHS  . insulin aspart  0-9 Units Subcutaneous TID WC  . isosorbide mononitrate  60 mg Oral Daily  .  mupirocin ointment  1 application Topical TID  . nicotine  21 mg Transdermal Daily  . ondansetron [facility]) IV  4 mg Intravenous Q6H    Waverly Ferrari Beeper: [phone] Office: [phone] 12/28/2017

## 2017-12-28 NOTE — Care Management Note (Addendum)
Case Management Note  Patient Details  Name: Jordan Gardner MRN: [id number] Date of Birth: November 06, 1950  Subjective/Objective:  History of HTN, HLD,severePVD, CADs/p CABG, CVA with residual left-sided weakness, DM type II, and tobacco abuse.  Transferred from [facility], admitted for Gangrene of Left foot. Prior to admission patient had been undergoing perioperative testing for planned femoral-popliteal bypass.  PCP noted.  Action/Plan: Prior to admission patient lived at home; active with home health agency.  At discharge patient plans to discharge to same living situation.  Patient states he has been to [facility] before.  Wasn't sure if he had home health before; offered choice stated no preference.  NCM offered to put in referral for Chaplain, patient declined at this time.   NCM will continue to follow for discharge transition needs.  Expected Discharge Date:  (unknown)               Expected Discharge Plan:  Home w Home Health Services  In-House Referral:  Clinical Social Work  Discharge planning Services  CM Consult  Post Acute Care Choice:    Choice offered to:     DME Arranged:    DME Agency:     HH Arranged:    HH Agency:     Status of Service:  In process, will continue to follow  Yancey Flemings, RN 12/28/2017, 1:42 PM

## 2017-12-28 NOTE — Progress Notes (Addendum)
Initial Nutrition Assessment  DOCUMENTATION CODES:   Not applicable  INTERVENTION:    Ensure Enlive po TID, each supplement provides 350 kcal and 20 grams of protein  NUTRITION DIAGNOSIS:   Increased nutrient needs related to wound healing as evidenced by estimated needs.  GOAL:   Patient will meet greater than or equal to 90% of their needs  MONITOR:   PO intake, Supplement acceptance, Labs, Skin  REASON FOR ASSESSMENT:   Malnutrition Screening Tool    ASSESSMENT:   67 yo male with PMH of HTN, CAD, sacral decubitus ulcer, PCM, HLD, PAD, stroke, asthma, DM, and neuromuscular disorder who was admitted on 5/10 with gangrene of toes on bilateral LE.  Vascular Surgery is recommending bilateral AKA. Patient reports fair intake. RN feeding patient during RD visit. He consumed 50% of lunch today. He likes chocolate and vanilla Ensure supplements.   Patient reports usual weight 230 lbs 1 year ago. Not confirmed by review of usual weights in EMR. Recent inability to walk due to BLE pain.  Labs reviewed. CBG's: 153-163 Medications reviewed and include Novolog, Zosyn, Vancomycin.  NUTRITION - FOCUSED PHYSICAL EXAM:    Most Recent Value  Orbital Region  Mild depletion  Upper Arm Region  Moderate depletion  Thoracic and Lumbar Region  Mild depletion  Buccal Region  Moderate depletion  Temple Region  Severe depletion  Clavicle Bone Region  Severe depletion  Clavicle and Acromion Bone Region  Severe depletion  Scapular Bone Region  Unable to assess  Dorsal Hand  Severe depletion  Patellar Region  Severe depletion  Anterior Thigh Region  Severe depletion  Posterior Calf Region  Severe depletion  Edema (RD Assessment)  None  Hair  Reviewed  Eyes  Reviewed  Mouth  Reviewed [poor dentition]  Skin  Reviewed  Nails  Reviewed     Muscle depletion could be related to recent decreased mobility and/or neuromuscular disorder.  Diet Order:   Diet Order           Diet heart  healthy/carb modified Room service appropriate? Yes; Fluid consistency: Thin  Diet effective now          EDUCATION NEEDS:   No education needs have been identified at this time  Skin:  Skin Assessment: Skin Integrity Issues: Skin Integrity Issues:: Stage II Stage II: coccyx  Last BM:  5/13  Height:   Ht Readings from Last 1 Encounters:  12/26/17  (1.854 m)    Weight:   Wt Readings from Last 1 Encounters:  12/26/17 163 lb 9.3 oz (74.2 kg)    Ideal Body Weight:  83.6 kg  BMI:  Body mass index is 21.58 kg/m.  Estimated Nutritional Needs:   Kcal:  2000-2200  Protein:  105-120 gm  Fluid:  2 L    Joaquin Courts, RD, LDN, CNSC Pager [phone] After Hours Pager [phone]

## 2017-12-28 NOTE — Progress Notes (Signed)
[facility]                                                                              Patient Demographics  Jordan Gardner, is a 66 y.o. male, DOB - 1950/09/09, WUJ:[id number]  Admit date - 12/25/2017   Admitting Physician Clydie Braun, MD  Outpatient Primary MD for the patient is [facility]  Outpatient specialists:   LOS - 3  days   Medical records reviewed and are as summarized below:    Chief Complaint  Patient presents with  . Foot Pain    bilateral       Brief summary   67 y.o.malewith medical history significant ofHTN, HLD,severePVD, CADs/p CABG, CVA with residual left-sided weakness, DM type II, and tobacco abuse;who presents with complaints of worsening bilateral foot pain and gangrene of the toes bilaterally, right worse than left.  Vascular surgery was consulted.  Patient was transferred to [facility] for vascular interventions.  Assessment & Plan    Principal Problem:   Gangrene of foot (HCC) in the setting of severe PVD, diabetes mellitus -Vascular surgery consulted, recommended vein mapping of bilateral lower extremities, ABIs and left femoral-popliteal bypass - Continue aspirin, statin -Leukocytosis trending down, continue vancomycin and Zosyn.  Follow vascular surgery recommendations  Active Problems:   Hyperlipidemia -Continue Lipitor    Tobacco abuse disorder -Continue nicotine patch.     Sacral decubitus ulcer POA -Chronic, wound care consulted    AKI (acute kidney injury) (HCC) -Presented with creatinine of 1.49, baseline 1.1 continue IV fluid hydration -Creatinine improved to 1.2, repeat BMET in a.m.  Type II diabetes mellitus, uncontrolled with hyperglycemia - Hold metformin, continue sliding scale insulin -CBGs somewhat elevated, add Lantus 5 units daily     Coronary artery disease involving coronary bypass graft of native heart with unstable angina pectoris (HCC) -Currently stable,  continue aspirin, Imdur, statin    Essential hypertension BP stable  Code Status: Full CODE STATUS DVT Prophylaxis: lovenox Family Communication: Discussed in detail with the patient, all imaging results, lab results explained to the patient    Disposition Plan:   Time Spent in minutes   25 minutes  Procedures:  None  Consultants:   Vascular surgery  Antimicrobials:   IV vancomycin 5/12  IV Zosyn 5/12   Medications  Scheduled Meds: . aspirin EC  81 mg Oral Daily  . atorvastatin  80 mg Oral QHS  . enoxaparin (LOVENOX) injection  40 mg Subcutaneous QHS  . gabapentin  100 mg Oral BID  . insulin aspart  0-5 Units Subcutaneous QHS  . insulin aspart  0-9 Units Subcutaneous TID WC  . isosorbide mononitrate  60 mg Oral Daily  . mupirocin ointment  1 application Topical TID  . nicotine  21 mg Transdermal Daily   Continuous Infusions: . sodium chloride 75 mL/hr at 12/27/17 2044  . piperacillin-tazobactam (ZOSYN)  IV Stopped (12/28/17 0954)  . vancomycin Stopped (12/28/17 1117)   PRN Meds:.acetaminophen **OR** acetaminophen, ipratropium-albuterol, morphine injection, oxyCODONE, povidone-iodine   Antibiotics   Anti-infectives (From admission, onward)   Start     Dose/Rate Route Frequency Ordered Stop  12/27/17 2300  vancomycin (VANCOCIN) IVPB 750 mg/150 ml premix     750 mg 150 mL/hr over 60 Minutes Intravenous Every 12 hours 12/27/17 1029     12/27/17 1400  piperacillin-tazobactam (ZOSYN) IVPB 3.375 g     3.375 g 12.5 mL/hr over 240 Minutes Intravenous Every 8 hours 12/27/17 1027     12/27/17 1030  vancomycin (VANCOCIN) 1,500 mg in sodium chloride 0.9 % 500 mL IVPB     1,500 mg 250 mL/hr over 120 Minutes Intravenous  Once 12/27/17 1027 12/27/17 1358   12/25/17 1700  vancomycin (VANCOCIN) IVPB 1000 mg/200 mL premix     1,000 mg 200 mL/hr over 60 Minutes Intravenous  Once 12/25/17 1653 12/25/17 1915   12/25/17 1700  piperacillin-tazobactam (ZOSYN) IVPB 3.375 g      3.375 g 100 mL/hr over 30 Minutes Intravenous  Once 12/25/17 1653 12/25/17 1813        Subjective:   Jordan Gardner was seen and examined today.  No complaints, resting in bed, no pain.  No acute issues overnight.  No fevers or chills, chest pain, shortness of breath.    Objective:   Vitals:   12/27/17 1643 12/27/17 2230 12/28/17 0446 12/28/17 1029  BP: (!) 168/90 (!) 139/97 (!) 156/79 137/72  Pulse: (!) 102 95 89 90  Resp: Temp: 98.3 F (36.8 C) 99 F (37.2 C) 98.4 F (36.9 C) 98.3 F (36.8 C)  TempSrc: Oral Oral Oral Oral  SpO2: (!) 88% 94% 100% 95%  Weight:      Height:        Intake/Output Summary (Last 24 hours) at 12/28/2017 1226 Last data filed at 12/28/2017 0600 Gross per 24 hour  Intake 1210 ml  Output 450 ml  Net 760 ml     Wt Readings from Last 3 Encounters:  12/26/17 74.2 kg (163 lb 9.3 oz)  12/22/17 70.4 kg (155 lb 3.2 oz)  11/25/17 74.3 kg (163 lb 14.4 oz)     Exam General: Alert and awake, oriented NAD Eyes: HEENT:  Atraumatic, normocephalic Cardiovascular: S1 S2 auscultated, RRR No pedal edema b/l Respiratory: CTAB Gastrointestinal: Soft, nontender, nondistended, + bowel sounds Ext: no pedal edema bilaterally Neuro: no new deficits Musculoskeletal: No digital cyanosis, clubbing Skin: Bilateral gangrene of the toes Psych: Normal affect and demeanor, alert and oriented     Data Reviewed:  I have personally reviewed following labs and imaging studies  Micro Results Recent Results (from the past 240 hour(s))  Blood culture (routine x 2)     Status: None (Preliminary result)   Collection Time: 12/25/17  5:35 PM  Result Value Ref Range Status   Specimen Description   Final    BLOOD LEFT FOREARM Performed at [facility], [address] [place], [place] [address]    Special Requests   Final    BOTTLES DRAWN AEROBIC AND ANAEROBIC Blood Culture adequate volume Performed at [facility], [address] [place], [place] [address]    Culture   Final    NO GROWTH 2 DAYS Performed at [facility], [address] [place], [place] [address]    Report Status PENDING  Incomplete  Blood culture (routine x 2)     Status: None (Preliminary result)   Collection Time: 12/25/17  5:35 PM  Result Value Ref Range Status   Specimen Description   Final    BLOOD RIGHT FOREARM Performed at [facility], [address] [place], [place] [address]    Special Requests   Final    BOTTLES DRAWN AEROBIC AND ANAEROBIC Blood Culture adequate volume Performed at [facility], [address], [place], [place] [address]    Culture   Final    NO GROWTH 2 DAYS Performed at [facility], [address] [place], [place] [address]    Report Status PENDING  Incomplete  Surgical pcr screen     Status: None   Collection Time: 12/26/17 11:56 PM  Result Value Ref Range Status   MRSA, PCR NEGATIVE NEGATIVE Final   Staphylococcus aureus NEGATIVE NEGATIVE Final    Comment: (NOTE) The Xpert SA Assay (FDA approved for NASAL specimens in patients 1 years of age and older), is one component of a comprehensive surveillance program. It is not intended to diagnose infection nor to guide or monitor treatment. Performed at [facility], [address], [place], [place] [address]     Radiology Reports Dg Chest 1 View  Result Date: 12/25/2017 CLINICAL DATA:  Proper evaluation for foot amputation, history hypertension, type II diabetes mellitus, stroke, coronary artery disease, smoker EXAM: CHEST  1 VIEW COMPARISON:  Portable exam 1713 hours compared to 07/31/2017 FINDINGS: Upper normal size of cardiac silhouette post CABG. Mediastinal contours and pulmonary vascularity normal. Skin folds project over the lungs bilaterally. Lungs clear. No acute infiltrate, pleural effusion or pneumothorax. Bones unremarkable. IMPRESSION: Post CABG. No acute  abnormalities. Electronically Signed   By: Ulyses Southward M.D.   On: 12/25/2017 18:42   Dg Foot Complete Left  Result Date: 12/25/2017 CLINICAL DATA:  Necrotic wounds at both feet EXAM: LEFT FOOT - COMPLETE 3+ VIEW COMPARISON:  10/29/2017 FINDINGS: Mild osseous demineralization. Joint spaces preserved. No acute fracture, dislocation, or bone destruction. Soft tissues unremarkable. IMPRESSION: No acute abnormalities. Electronically Signed   By: Ulyses Southward M.D.   On: 12/25/2017 18:38   Dg Foot Complete Right  Result Date: 12/25/2017 CLINICAL DATA:  Necrotic wounds at both feet EXAM: RIGHT FOOT COMPLETE - 3+ VIEW COMPARISON:  10/29/2017 FINDINGS: Osseous demineralization. Advanced degenerative changes at first MTP joint. Mild degenerative changes at IP joint great toe. Remaining joint spaces fairly well preserved. No acute fracture, dislocation, or bone destruction. Soft tissues radiographically unremarkable. 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 No acute bony abnormalities. Electronically Signed   By: Ulyses Southward M.D.   On: 12/25/2017 18:39    Lab Data:  CBC: Recent Labs  Lab 12/25/17 1735 12/26/17 0531  WBC 16.6* 15.7*  NEUTROABS 13.2*  --   HGB 11.5* 11.3*  HCT 35.7* 35.3*  MCV 80.0 80.0  PLT 243 246   Basic Metabolic Panel: Recent Labs  Lab 12/25/17 1735 12/26/17 0531  NA 136 136  K 4.8 4.6  CL 105 106  CO2 22 20*  GLUCOSE 186* 151*  BUN 35* 33*  CREATININE 1.49* 1.28*  CALCIUM 8.3* 8.3*  MG  --  1.7   GFR: Estimated Creatinine Clearance: 59.6 mL/min (A) (by C-G formula based on SCr of 1.28 mg/dL (H)). Liver Function Tests: Recent Labs  Lab 12/25/17 1735  AST 32  ALT 28  ALKPHOS 209*  BILITOT 0.6  PROT 6.0*  ALBUMIN 2.4*   No results for input(s): LIPASE, AMYLASE in the last 168 hours. No results for input(s): AMMONIA in the last 168 hours. Coagulation Profile: Recent Labs  Lab  12/25/17 1735  12/26/17 0531  INR 1.31 1.34   Cardiac Enzymes: No results for input(s): CKTOTAL, CKMB, CKMBINDEX, TROPONINI in the last 168 hours. BNP (last 3 results) No results for input(s): PROBNP in the last 8760 hours. HbA1C: No results for input(s): HGBA1C in the last 72 hours. CBG: Recent Labs  Lab 12/27/17 0747 12/27/17 1147 12/27/17 1644 12/27/17 2232 12/28/17 0752  GLUCAP 132* 177* 191* 158* 153*   Lipid Profile: No results for input(s): CHOL, HDL, LDLCALC, TRIG, CHOLHDL, LDLDIRECT in the last 72 hours. Thyroid Function Tests: No results for input(s): TSH, T4TOTAL, FREET4, T3FREE, THYROIDAB in the last 72 hours. Anemia Panel: No results for input(s): VITAMINB12, FOLATE, FERRITIN, TIBC, IRON, RETICCTPCT in the last 72 hours. Urine analysis:    Component Value Date/Time   COLORURINE AMBER (A) 07/28/2017 1025   APPEARANCEUR HAZY (A) 07/28/2017 1025   LABSPEC 1.026 07/28/2017 1025   PHURINE 5.0 07/28/2017 1025   GLUCOSEU NEGATIVE 07/28/2017 1025   HGBUR NEGATIVE 07/28/2017 1025   BILIRUBINUR NEGATIVE 07/28/2017 1025   KETONESUR NEGATIVE 07/28/2017 1025   PROTEINUR >=300 (A) 07/28/2017 1025   NITRITE NEGATIVE 07/28/2017 1025   LEUKOCYTESUR NEGATIVE 07/28/2017 1025     Sammi Stolarz M.D. [facility] 12/28/2017, 12:26 PM  Pager: [phone] Between 7am to 7pm - call Pager - [phone]  After 7pm go to [email or web address] - password [email or web address]  Call night coverage person covering after 7pm

## 2017-12-28 NOTE — Progress Notes (Signed)
Painted patient's bilateral toes on both feet with betadine, leaving them open to air to dry. Will continue to monitor.  Jaynee Eagles, RN

## 2017-12-28 NOTE — Consult Note (Signed)
WOC Nurse wound consult note Reason for Consult: toes and sacrum Wound type: Dry grangrenous toes; [facility] has consulted and will follow for this. Orders updated per Dr. Darcella Cheshire notes Stage 2 Pressure Injury: coccyx Pressure Injury POA: Yes Measurement:coccyx: 0.4cm x 0.1cm x 0.1cm  Wound JYN:WGNFA, non granular Drainage (amount, consistency, odor) minimal, no odor  Periwound: intact, tender to palpation Dressing procedure/placement/frequency: Skin care order set implemented appropriately, silicone foam dressing, change every 3 days and PRN soilage. LALM in place per bedside nursing staff. Paint toes with betadine swab daily and allow to air dry.  Discussed POC with patient and bedside nurse.  Re consult if needed, will not follow at this time. Thanks  Consuella Scurlock [employer], RN,CWOCN, CNS, CWON-AP [phone])

## 2017-12-29 LAB — GLUCOSE, CAPILLARY
GLUCOSE-CAPILLARY: 187 mg/dL — AB (ref 65–99)
Glucose-Capillary: 199 mg/dL — ABNORMAL HIGH (ref 65–99)
Glucose-Capillary: 196 mg/dL — ABNORMAL HIGH (ref 65–99)
Glucose-Capillary: 158 mg/dL — ABNORMAL HIGH (ref 65–99)

## 2017-12-29 MED ORDER — INSULIN GLARGINE 100 UNIT/ML ~~LOC~~ SOLN
8.0000 [IU] | Freq: Every day | SUBCUTANEOUS | Status: DC
  Administered 2017-12-29 – 2017-12-30 (×2): 8 [IU] via SUBCUTANEOUS
  Filled 2017-12-29 (×2): qty 0.08

## 2017-12-29 MED ORDER — ONDANSETRON HCL 4 MG/2ML IJ SOLN
4.0000 mg | Freq: Four times a day (QID) | INTRAMUSCULAR | Status: DC | PRN
Start: 2017-12-29 — End: 2018-01-01
  Administered 2017-12-29 – 2018-01-01 (×2): 4 mg via INTRAVENOUS
  Filled 2017-12-29 (×2): qty 2

## 2017-12-29 NOTE — Progress Notes (Signed)
   VASCULAR SURGERY ASSESSMENT & PLAN:   As per my note yesterday, I do not think this patient is a good candidate for revascularization.  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  His options for revascularization would involve significant risk.  He has been evaluated by cardiology and was felt to be very high risk for surgery.  All things considered I think he would be best be served by bilateral above-the-knee amputations if the wounds on his feet progress.  He was previously seen by Dr. Aldean Baker for this issue.  Therefore he would have to be reconsulted if he ultimately comes to amputation.  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 aspirin EC  81 mg Oral Daily  . atorvastatin  80 mg Oral QHS  . enoxaparin (LOVENOX) injection  40 mg Subcutaneous QHS  . feeding supplement (ENSURE ENLIVE)  237 mL Oral TID BM  . gabapentin  100 mg Oral BID  . insulin aspart  0-5 Units Subcutaneous QHS  . insulin aspart  0-9 Units Subcutaneous TID WC  . insulin glargine  8 Units Subcutaneous QHS  . isosorbide mononitrate  60 mg Oral Daily  . multivitamin with minerals  1 tablet Oral Daily  . mupirocin ointment  1 application Topical TID  . nicotine  21 mg Transdermal Daily    Waverly Ferrari Beeper: [phone] Office: [phone] 12/29/2017

## 2017-12-29 NOTE — Progress Notes (Signed)
[facility]                                                                              Patient Demographics  Jordan Gardner, is a 67 y.o. male, DOB - 08/01/1951, AVW:[id number]  Admit date - 12/25/2017   Admitting Physician Clydie Braun, MD  Outpatient Primary MD for the patient is [facility]  Outpatient specialists:   LOS - 4  days   Medical records reviewed and are as summarized below:    Chief Complaint  Patient presents with  . Foot Pain    bilateral       Brief summary   67 y.o.malewith medical history significant ofHTN, HLD,severePVD, CADs/p CABG, CVA with residual left-sided weakness, DM type II, and tobacco abuse;who presents with complaints of worsening bilateral foot pain and gangrene of the toes bilaterally, right worse than left.  Vascular surgery was consulted.  Patient was transferred to [facility] for vascular interventions.  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   Active Problems:   Hyperlipidemia -Continue Lipitor    Tobacco abuse disorder -Continue nicotine patch.     Sacral decubitus ulcer POA -Chronic, wound care consulted    AKI (acute kidney injury) (HCC) -Presented with creatinine of 1.49, baseline 1.1 continue IV fluid hydration Creatinine improved to 1.28, follow BMET in a.m.  Type II diabetes mellitus, uncontrolled with hyperglycemia - Hold metformin, continue sliding scale insulin -CBGs still somewhat elevated, increase Lantus to 8 units    Coronary artery disease involving coronary bypass graft of native heart  with unstable angina pectoris (HCC) -Currently stable, continue aspirin, Imdur, statin    Essential hypertension BP stable  Code Status: Full CODE STATUS DVT Prophylaxis: lovenox Family Communication: Discussed in detail with the patient, all imaging results, lab results explained to the patient    Disposition Plan:   Time Spent in minutes   25 minutes  Procedures:  None  Consultants:   Vascular surgery  Antimicrobials:   IV vancomycin 5/12  IV Zosyn 5/12   Medications  Scheduled Meds: . aspirin EC  81 mg Oral Daily  . atorvastatin  80 mg Oral QHS  . enoxaparin (LOVENOX) injection  40 mg Subcutaneous QHS  . feeding supplement (ENSURE ENLIVE)  237 mL Oral TID BM  . gabapentin  100 mg Oral BID  . insulin aspart  0-5 Units Subcutaneous QHS  . insulin aspart  0-9 Units Subcutaneous TID WC  . isosorbide mononitrate  60 mg Oral Daily  . multivitamin with minerals  1 tablet Oral Daily  . mupirocin ointment  1 application Topical TID  . nicotine  21 mg Transdermal Daily   Continuous Infusions: . sodium chloride 75 mL/hr at 12/28/17 1356  . piperacillin-tazobactam (ZOSYN)  IV Stopped (12/29/17 0835)  . vancomycin 750 mg (12/29/17 1158)   PRN Meds:.acetaminophen **  OR** acetaminophen, ipratropium-albuterol, morphine injection, oxyCODONE, povidone-iodine   Antibiotics   Anti-infectives (From admission, onward)   Start     Dose/Rate Route Frequency Ordered Stop   12/27/17 2300  vancomycin (VANCOCIN) IVPB 750 mg/150 ml premix     750 mg 150 mL/hr over 60 Minutes Intravenous Every 12 hours 12/27/17 1029     12/27/17 1400  piperacillin-tazobactam (ZOSYN) IVPB 3.375 g     3.375 g 12.5 mL/hr over 240 Minutes Intravenous Every 8 hours 12/27/17 1027     12/27/17 1030  vancomycin (VANCOCIN) 1,500 mg in sodium chloride 0.9 % 500 mL IVPB     1,500 mg 250 mL/hr over 120 Minutes Intravenous  Once 12/27/17 1027 12/27/17 1358   12/25/17 1700  vancomycin (VANCOCIN) IVPB 1000  mg/200 mL premix     1,000 mg 200 mL/hr over 60 Minutes Intravenous  Once 12/25/17 1653 12/25/17 1915   12/25/17 1700  piperacillin-tazobactam (ZOSYN) IVPB 3.375 g     3.375 g 100 mL/hr over 30 Minutes Intravenous  Once 12/25/17 1653 12/25/17 1813        Subjective:   Dequon Truss was seen and examined today.  No complaints, pain in the toes.  No fevers or chills.  No chest pain, shortness breath, nausea vomiting or abdominal pain.   Objective:   Vitals:   12/28/17 1029 12/28/17 2039 12/29/17 0528 12/29/17 0823  BP: 137/72 (!) 143/83 (!) 149/85 (!) 145/101  Pulse: 90 95 98 95  Resp: Temp: 98.3 F (36.8 C) 99.7 F (37.6 C) 99 F (37.2 C) 99 F (37.2 C)  TempSrc: Oral Oral Oral Oral  SpO2: 95% 98% 94% 94%  Weight:  74.2 kg (163 lb 9.3 oz)    Height:        Intake/Output Summary (Last 24 hours) at 12/29/2017 1200 Last data filed at 12/29/2017 7829 Gross per 24 hour  Intake 2708.75 ml  Output 400 ml  Net 2308.75 ml     Wt Readings from Last 3 Encounters:  12/28/17 74.2 kg (163 lb 9.3 oz)  12/22/17 70.4 kg (155 lb 3.2 oz)  11/25/17 74.3 kg (163 lb 14.4 oz)     Exam   General: Alert and oriented, NAD  Eyes:   HEENT:  Atraumatic, normocephalic, normal oropharynx  Cardiovascular: S1 S2 auscultated,Regular rate and rhythm. No pedal edema b/l  Respiratory: CTAB  Gastrointestinal: Soft, nontender, nondistended, + bowel sounds  Ext: no pedal edema bilaterally  Neuro: no new deficit  Musculoskeletal: No digital cyanosis, clubbing  Skin: Bilateral gangrene of the toes, foul dor  Psych: Normal affect and demeanor, alert and oriented x3    Data Reviewed:  I have personally reviewed following labs and imaging studies  Micro Results Recent Results (from the past 240 hour(s))  Blood culture (routine x 2)     Status: None (Preliminary result)   Collection Time: 12/25/17  5:35 PM  Result Value Ref Range Status   Specimen Description    Final    BLOOD LEFT FOREARM Performed at [facility], [address], [place], [place] [address]    Special Requests   Final    BOTTLES DRAWN AEROBIC AND ANAEROBIC Blood Culture adequate volume Performed at [facility], [address], [place], [place] [address]    Culture   Final    NO GROWTH 4 DAYS Performed at [facility], [address], [place], [place] [address]    Report Status PENDING  Incomplete  Blood culture (routine x 2)     Status: None (Preliminary result)   Collection Time: 12/25/17  5:35 PM  Result Value Ref Range Status   Specimen Description   Final    BLOOD RIGHT FOREARM Performed at [facility], [address] [place], [place] [address]    Special Requests   Final    BOTTLES DRAWN AEROBIC AND ANAEROBIC Blood Culture adequate volume Performed at [facility], [address], [place], [place] [address]    Culture   Final    NO GROWTH 4 DAYS Performed at [facility], [address], [place], [place] [address]    Report Status PENDING  Incomplete  Surgical pcr screen     Status: None   Collection Time: 12/26/17 11:56 PM  Result Value Ref Range Status   MRSA, PCR NEGATIVE NEGATIVE Final   Staphylococcus aureus NEGATIVE NEGATIVE Final    Comment: (NOTE) The Xpert SA Assay (FDA approved for NASAL specimens in patients 45 years of age and older), is one component of a comprehensive surveillance program. It is not intended to diagnose infection nor to guide or monitor treatment. Performed at [facility], [address] [place], [place] [address]     Radiology Reports Dg Chest 1 View  Result Date: 12/25/2017 CLINICAL DATA:  Proper evaluation for foot amputation, history hypertension, type II diabetes mellitus, stroke, coronary artery disease, smoker EXAM: CHEST  1 VIEW COMPARISON:  Portable exam 1713 hours compared to 07/31/2017 FINDINGS: Upper normal size of  cardiac silhouette post CABG. Mediastinal contours and pulmonary vascularity normal. Skin folds project over the lungs bilaterally. Lungs clear. No acute infiltrate, pleural effusion or pneumothorax. Bones unremarkable. IMPRESSION: Post CABG. No acute abnormalities. Electronically Signed   By: Ulyses Southward M.D.   On: 12/25/2017 18:42   Dg Foot Complete Left  Result Date: 12/25/2017 CLINICAL DATA:  Necrotic wounds at both feet EXAM: LEFT FOOT - COMPLETE 3+ VIEW COMPARISON:  10/29/2017 FINDINGS: Mild osseous demineralization. Joint spaces preserved. No acute fracture, dislocation, or bone destruction. Soft tissues unremarkable. IMPRESSION: No acute abnormalities. Electronically Signed   By: Ulyses Southward M.D.   On: 12/25/2017 18:38   Dg Foot Complete Right  Result Date: 12/25/2017 CLINICAL DATA:  Necrotic wounds at both feet EXAM: RIGHT FOOT COMPLETE - 3+ VIEW COMPARISON:  10/29/2017 FINDINGS: Osseous demineralization. Advanced degenerative changes at first MTP joint. Mild degenerative changes at IP joint great toe. Remaining joint spaces fairly well preserved. No acute fracture, dislocation, or bone destruction. Soft tissues radiographically unremarkable. 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 No acute bony abnormalities. Electronically Signed   By: Ulyses Southward M.D.   On: 12/25/2017 18:39    Lab Data:  CBC: Recent Labs  Lab 12/25/17 1735 12/26/17 0531  WBC 16.6* 15.7*  NEUTROABS 13.2*  --   HGB 11.5* 11.3*  HCT 35.7* 35.3*  MCV 80.0 80.0  PLT 243 246   Basic Metabolic Panel: Recent Labs  Lab 12/25/17 1735 12/26/17 0531  NA 136 136  K 4.8 4.6  CL 105 106  CO2 22 20*  GLUCOSE 186* 151*  BUN 35* 33*  CREATININE 1.49* 1.28*  CALCIUM 8.3* 8.3*  MG  --  1.7   GFR: Estimated Creatinine Clearance: 59.6 mL/min (A) (by C-G formula based on SCr of 1.28 mg/dL (H)). Liver Function Tests: Recent Labs  Lab  12/25/17 1735  AST 32  ALT 28  ALKPHOS 209*  BILITOT 0.6  PROT 6.0*  ALBUMIN 2.4*   No results for input(s): LIPASE, AMYLASE in the last 168 hours. No results for input(s): AMMONIA in the last 168 hours. Coagulation Profile: Recent Labs  Lab 12/25/17 1735 12/26/17 0531  INR 1.31 1.34   Cardiac Enzymes: No results for input(s): CKTOTAL, CKMB, CKMBINDEX, TROPONINI in the last 168 hours. BNP (last 3 results) No results for input(s): PROBNP in the last 8760 hours. HbA1C: No results for input(s): HGBA1C in the last 72 hours. CBG: Recent Labs  Lab 12/28/17 1217 12/28/17 1651 12/28/17 2038 12/29/17 0727 12/29/17 1136  GLUCAP 163* 138* 152* 199* 196*   Lipid Profile: No results for input(s): CHOL, HDL, LDLCALC, TRIG, CHOLHDL, LDLDIRECT in the last 72 hours. Thyroid Function Tests: No results for input(s): TSH, T4TOTAL, FREET4, T3FREE, THYROIDAB in the last 72 hours. Anemia Panel: No results for input(s): VITAMINB12, FOLATE, FERRITIN, TIBC, IRON, RETICCTPCT in the last 72 hours. Urine analysis:    Component Value Date/Time   COLORURINE AMBER (A) 07/28/2017 1025   APPEARANCEUR HAZY (A) 07/28/2017 1025   LABSPEC 1.026 07/28/2017 1025   PHURINE 5.0 07/28/2017 1025   GLUCOSEU NEGATIVE 07/28/2017 1025   HGBUR NEGATIVE 07/28/2017 1025   BILIRUBINUR NEGATIVE 07/28/2017 1025   KETONESUR NEGATIVE 07/28/2017 1025   PROTEINUR >=300 (A) 07/28/2017 1025   NITRITE NEGATIVE 07/28/2017 1025   LEUKOCYTESUR NEGATIVE 07/28/2017 1025     Brason Berthelot M.D. [facility] 12/29/2017, 12:00 PM  Pager: [phone] Between 7am to 7pm - call Pager - [phone]  After 7pm go to [email or web address] - password [email or web address]  Call night coverage person covering after 7pm

## 2017-12-29 NOTE — Care Management Important Message (Addendum)
Important Message  Patient Details  Name: Jordan Gardner MRN: [id number] Date of Birth: [birth year]   Medicare Important Message Given:  No   [name] 12/29/2017, 9:55 AM

## 2017-12-29 NOTE — Care Management Important Message (Signed)
Important Message  Patient Details  Name: Jordan Gardner MRN: [id number] Date of Birth: 08-14-1951   Medicare Important Message Given:  Yes    Sherryll Skoczylas Stefan Church 12/29/2017, 9:58 AM

## 2017-12-30 LAB — CULTURE, BLOOD (ROUTINE X 2)
CULTURE: NO GROWTH
CULTURE: NO GROWTH
SPECIAL REQUESTS: ADEQUATE
Special Requests: ADEQUATE

## 2017-12-30 LAB — BASIC METABOLIC PANEL
Anion gap: 7 (ref 5–15)
BUN: 20 mg/dL (ref 6–20)
CO2: 22 mmol/L (ref 22–32)
Calcium: 8.2 mg/dL — ABNORMAL LOW (ref 8.9–10.3)
Chloride: 107 mmol/L (ref 101–111)
Creatinine, Ser: 1.07 mg/dL (ref 0.61–1.24)
GFR calc Af Amer: >60 mL/min (ref >60)
GLUCOSE: 157 mg/dL — AB (ref 65–99)
POTASSIUM: 4.7 mmol/L (ref 3.5–5.1)
Sodium: 136 mmol/L (ref 135–145)

## 2017-12-30 LAB — GLUCOSE, CAPILLARY
GLUCOSE-CAPILLARY: 96 mg/dL (ref 65–99)
Glucose-Capillary: 166 mg/dL — ABNORMAL HIGH (ref 65–99)
Glucose-Capillary: 140 mg/dL — ABNORMAL HIGH (ref 65–99)
Glucose-Capillary: 128 mg/dL — ABNORMAL HIGH (ref 65–99)

## 2017-12-30 LAB — CBC
HCT: 37.1 % — ABNORMAL LOW (ref 39.0–52.0)
Hemoglobin: 11.6 g/dL — ABNORMAL LOW (ref 13.0–17.0)
MCH: 24.9 pg — AB (ref 26.0–34.0)
MCHC: 31.3 g/dL (ref 30.0–36.0)
MCV: 79.8 fL (ref 78.0–100.0)
Platelets: 218 10*3/uL (ref 150–400)
RBC: 4.65 MIL/uL (ref 4.22–5.81)
RDW: 16.8 % — ABNORMAL HIGH (ref 11.5–15.5)
WBC: 17.6 10*3/uL — ABNORMAL HIGH (ref 4.0–10.5)

## 2017-12-30 LAB — VANCOMYCIN, TROUGH: VANCOMYCIN TR: 23 ug/mL — AB (ref 15–20)

## 2017-12-30 MED ORDER — VANCOMYCIN HCL 500 MG IV SOLR
500.0000 mg | Freq: Two times a day (BID) | INTRAVENOUS | Status: DC
  Administered 2017-12-30 – 2017-12-31 (×2): 500 mg via INTRAVENOUS
  Filled 2017-12-30 (×3): qty 500

## 2017-12-30 NOTE — Progress Notes (Signed)
Pharmacy Antibiotic Note  Jordan Gardner is a 67 y.o. male admitted on 12/25/2017 with wound infection.  Pharmacy has been consulted for vancomycin and Zosyn dosing.  A Vancomycin trough this morning resulted as SUPRAtherapeutic (VT 10-20 mcg/ml). Renal function has improved - will reduce the dose slightly to achieve target concentrations.  Plan: - Reduce Vancomycin to 500 mg IV every 12 hours - Continue Zosyn 3.375g IV every 8 hours (infused over 4 hours) - Will follow-up plans for   Height:  (185.4 cm) Weight: 163 lb 9.3 oz (74.2 kg) IBW/kg (Calculated) : 79.9  Temp (24hrs), Avg:98.3 F (36.8 C), Min:97.8 F (36.6 C), Max:98.9 F (37.2 C)  Recent Labs  Lab 12/25/17 1726 12/25/17 1735 12/25/17 1849 12/26/17 0531 12/30/17 0417  WBC  --  16.6*  --  15.7* 17.6*  CREATININE  --  1.49*  --  1.28*  --   LATICACIDVEN 1.05  --  1.33  --   --     Estimated Creatinine Clearance: 59.6 mL/min (A) (by C-G formula based on SCr of 1.28 mg/dL (H)).    No Known Allergies  Antimicrobials this admission:  Vanc 5/12 >> Zosyn 5/12 >>  Dose adjustments this admission:  n/a  Microbiology results:  5/11 MRSA PCR negative 5/10 BCx >> ngtd   Thank you for allowing pharmacy to be a part of this patient's care.  Georgina Pillion, PharmD, BCPS Clinical Pharmacist Pager: [phone] Clinical phone for 12/30/2017 from 7a-3:30p: [phone] If after 3:30p, please call main pharmacy at: [phone] 12/30/2017 8:40 AM

## 2017-12-30 NOTE — Progress Notes (Signed)
[facility] PROGRESS NOTE  Ahmadou Lorella Nimrod WJX:[id number] DOB: 1951/03/08 DOA: 12/25/2017  PCP: [facility]  Brief History/Interval Summary: 67 y.o.malewith medical history significant ofHTN, HLD,severePVD, CADs/p CABG, CVA with residual left-sided weakness, DM type II, and tobacco abuse;who presents with complaints of worsening bilateral foot pain and gangrene of the toes bilaterally, right worse than left.  Vascular surgery was consulted.  Patient was transferred to [facility] for vascular interventions.  Reason for Visit: Gangrene involving bilateral feet  Consultants: Vascular surgery.  Procedures: None  Antibiotics: Vancomycin and Zosyn  Subjective/Interval History: Patient complains of pain in both feet right more than left.  Denies any chest pain shortness of breath.  No nausea vomiting.  His [facility] caseworker is at the bedside.  ROS: Denies any headaches  Objective:  Vital Signs  Vitals:   12/29/17 1607 12/29/17 2039 12/30/17 0502 12/30/17 0813  BP: (!) 158/88 (!) 172/99 (!) 165/89 (!) 145/85  Pulse: 98 (!) 101 100 93  Resp: Temp: 98.6 F (37 C) 98.9 F (37.2 C) 97.8 F (36.6 C) 98 F (36.7 C)  TempSrc: Oral Oral Oral Oral  SpO2: 97% 92% 94% 98%  Weight:      Height:        Intake/Output Summary (Last 24 hours) at 12/30/2017 1214 Last data filed at 12/30/2017 0600 Gross per 24 hour  Intake 3150 ml  Output 225 ml  Net 2925 ml   Filed Weights   12/26/17 1553 12/26/17 2027 12/28/17 2039  Weight: 72.2 kg (159 lb 2.8 oz) 74.2 kg (163 lb 9.3 oz) 74.2 kg (163 lb 9.3 oz)    General appearance: alert, cooperative, appears stated age and no distress Head: Normocephalic, without obvious abnormality, atraumatic Resp: clear to auscultation bilaterally Cardio: regular rate and rhythm, S1, S2 normal, no murmur, click, rub or gallop GI: soft, non-tender; bowel sounds normal; no masses,  no organomegaly Extremities:  Gangrenous changes noted both feet involving the toes bilaterally.  Foul smell is present.  Poorly palpable pulse. Pulses: Poorly palpable pulses in both lower extremities. Neurologic: No obvious focal neurological deficits present.  Lab Results:  Data Reviewed: I have personally reviewed following labs and imaging studies  CBC: Recent Labs  Lab 12/25/17 1735 12/26/17 0531 12/30/17 0417  WBC 16.6* 15.7* 17.6*  NEUTROABS 13.2*  --   --   HGB 11.5* 11.3* 11.6*  HCT 35.7* 35.3* 37.1*  MCV 80.0 80.0 79.8  PLT 243 246 218    Basic Metabolic Panel: Recent Labs  Lab 12/25/17 1735 12/26/17 0531 12/30/17 1027  NA 136 136 136  K 4.8 4.6 4.7  CL 105 106 107  CO2 22 20* 22  GLUCOSE 186* 151* 157*  BUN 35* 33* 20  CREATININE 1.49* 1.28* 1.07  CALCIUM 8.3* 8.3* 8.2*  MG  --  1.7  --     GFR: Estimated Creatinine Clearance: 71.3 mL/min (by C-G formula based on SCr of 1.07 mg/dL).  Liver Function Tests: Recent Labs  Lab 12/25/17 1735  AST 32  ALT 28  ALKPHOS 209*  BILITOT 0.6  PROT 6.0*  ALBUMIN 2.4*    Coagulation Profile: Recent Labs  Lab 12/25/17 1735 12/26/17 0531  INR 1.31 1.34    CBG: Recent Labs  Lab 12/29/17 1136 12/29/17 1657 12/29/17 2136 12/30/17 0811 12/30/17 1134  GLUCAP 196* 187* 158* 166* 140*      Recent Results (from the past 240 hour(s))  Blood culture (routine x 2)  Status: None (Preliminary result)   Collection Time: 12/25/17  5:35 PM  Result Value Ref Range Status   Specimen Description   Final    BLOOD LEFT FOREARM Performed at [facility], [address], [place], [place] [address]    Special Requests   Final    BOTTLES DRAWN AEROBIC AND ANAEROBIC Blood Culture adequate volume Performed at [facility], [address], [place], [place] [address]    Culture   Final    NO GROWTH 4 DAYS Performed at [facility], [address], [place], [place] [address]    Report Status  PENDING  Incomplete  Blood culture (routine x 2)     Status: None (Preliminary result)   Collection Time: 12/25/17  5:35 PM  Result Value Ref Range Status   Specimen Description   Final    BLOOD RIGHT FOREARM Performed at [facility], [address], [place], [place] [address]    Special Requests   Final    BOTTLES DRAWN AEROBIC AND ANAEROBIC Blood Culture adequate volume Performed at [facility], [address] [place], [place] [address]    Culture   Final    NO GROWTH 4 DAYS Performed at [facility], [address] [place], [place] [address]    Report Status PENDING  Incomplete  Surgical pcr screen     Status: None   Collection Time: 12/26/17 11:56 PM  Result Value Ref Range Status   MRSA, PCR NEGATIVE NEGATIVE Final   Staphylococcus aureus NEGATIVE NEGATIVE Final    Comment: (NOTE) The Xpert SA Assay (FDA approved for NASAL specimens in patients 28 years of age and older), is one component of a comprehensive surveillance program. It is not intended to diagnose infection nor to guide or monitor treatment. Performed at [facility], [address], [place], [place] [address]       Radiology Studies: No results found.   Medications:  Scheduled: . aspirin EC  81 mg Oral Daily  . atorvastatin  80 mg Oral QHS  . enoxaparin (LOVENOX) injection  40 mg Subcutaneous QHS  . feeding supplement (ENSURE ENLIVE)  237 mL Oral TID BM  . gabapentin  100 mg Oral BID  . insulin aspart  0-5 Units Subcutaneous QHS  . insulin aspart  0-9 Units Subcutaneous TID WC  . insulin glargine  8 Units Subcutaneous QHS  . isosorbide mononitrate  60 mg Oral Daily  . multivitamin with minerals  1 tablet Oral Daily  . mupirocin ointment  1 application Topical TID  . nicotine  21 mg Transdermal Daily   Continuous: . sodium chloride 75 mL/hr at 12/30/17 0925  . piperacillin-tazobactam (ZOSYN)  IV Stopped (12/30/17 0940)  . 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  They think that the patient is not a good candidate for revascularization.  He has multivessel disease and any options  for revascularization would involve significant risk.  Seen by cardiology recently and thought to be high risk for surgery.  In view of this vascular surgery recommends above-knee amputation for his gangrene.  I have called Dr. Lajoyce Corners and left a message. He will be asked to consult on this patient.  Continue with broad-spectrum antibiotics for now.  Follow-up on cultures.  Continue aspirin statin.  Hyperlipidemia Continue Lipitor  Tobacco abuse disorder Continue nicotine patch.   Sacral decubitus ulcer POA Chronic, wound care consulted  AKI (acute kidney injury) Presented with creatinine of 1.49, baseline 1.1.  Patient was given IV fluids.  Creatinine improved to 1.07 today.  Type II diabetes mellitus, uncontrolled with hyperglycemia Holding metformin.  Continue Lantus.  Dose was increased recently.  Continue sliding scale coverage.  CBGs are better controlled.  Coronary artery disease involving coronary bypass graft of native heart Stable currently.  Continue home medications.  Seen by cardiology recently.  Normocytic anemia Monitor hemoglobin closely.  No evidence of overt bleeding.  Essential hypertension Stable.   Continue to monitor closely.  DVT Prophylaxis: Lovenox    Code Status: Full code Family Communication: Discussed with the patient Disposition Plan: Management as outlined above.  Await orthopedic input.    LOS: 5 days   Osvaldo Shipper  [facility] Pager [phone] 12/30/2017, 12:14 PM  If 7PM-7AM, please contact night-coverage at [email or web address], password [facility]

## 2017-12-31 DIAGNOSIS — I96 Gangrene, not elsewhere classified: Secondary | ICD-10-CM

## 2017-12-31 DIAGNOSIS — E1151 Type 2 diabetes mellitus with diabetic peripheral angiopathy without gangrene: Secondary | ICD-10-CM

## 2017-12-31 LAB — GLUCOSE, CAPILLARY
GLUCOSE-CAPILLARY: 138 mg/dL — AB (ref 65–99)
Glucose-Capillary: 74 mg/dL (ref 65–99)
Glucose-Capillary: 83 mg/dL (ref 65–99)
Glucose-Capillary: 92 mg/dL (ref 65–99)

## 2017-12-31 MED ORDER — CEPHALEXIN 500 MG PO CAPS
500.0000 mg | ORAL_CAPSULE | Freq: Three times a day (TID) | ORAL | Status: DC
  Administered 2017-12-31 – 2018-01-01 (×4): 500 mg via ORAL
  Filled 2017-12-31 (×4): qty 1

## 2017-12-31 MED ORDER — INSULIN GLARGINE 100 UNIT/ML ~~LOC~~ SOLN
6.0000 [IU] | Freq: Every day | SUBCUTANEOUS | Status: DC
  Administered 2017-12-31: 6 [IU] via SUBCUTANEOUS
  Filled 2017-12-31 (×2): qty 0.06

## 2017-12-31 NOTE — Evaluation (Signed)
Clinical/Bedside Swallow Evaluation Patient Details  Name: Jordan Gardner MRN: [id number] Date of Birth: Oct 24, 1950  Today's Date: 12/31/2017 Time: SLP Start Time (ACUTE ONLY): 1505 SLP Stop Time (ACUTE ONLY): 1519 SLP Time Calculation (min) (ACUTE ONLY): 14 min  Past Medical History:  Past Medical History:  Diagnosis Date  . Anxiety   . Arthritis   . Asthma   . Coronary artery disease   . Depression   . Diabetes mellitus without complication (HCC)    type 2  . Headache   . Hyperlipidemia   . Hypertension   . Myocardial infarction (HCC)   . Neuromuscular disorder (HCC)   . PAD (peripheral artery disease) (HCC)   . Protein calorie malnutrition (HCC) 04/2017  . Psychotic affective disorder (HCC)    Per sister. Unsure of Dx. Pt sleeps late and difficult to wake. odd affect  . Sacral decubitus ulcer 04/2017  . Stroke [facility])    left side weakness   Past Surgical History:  Past Surgical History:  Procedure Laterality Date  . ABDOMINAL AORTOGRAM W/LOWER EXTREMITY N/A 06/08/2017   Procedure: ABDOMINAL AORTOGRAM W/LOWER EXTREMITY;  Surgeon: Chuck Hint, MD;  Location: [facility] INVASIVE CV LAB;  Service: Cardiovascular;  Laterality: N/A;  . BACK SURGERY    . CARDIAC CATHETERIZATION    . CARDIAC SURGERY     CABG x5 2012 - [facility]  . CAROTID-SUBCLAVIAN BYPASS GRAFT Left 07/23/2017   Procedure: BYPASS SUBCLAVIAN ARTERY  USING VIABAHN GORE STENT GRAFT;  Surgeon: Chuck Hint, MD;  Location: [facility] OR;  Service: Vascular;  Laterality: Left;  . LEFT HEART CATH AND CORS/GRAFTS ANGIOGRAPHY N/A 06/26/2017   Procedure: LEFT HEART CATH AND CORS/GRAFTS ANGIOGRAPHY;  Surgeon: Tonny Bollman, MD;  Location: [facility] INVASIVE CV LAB;  Service: Cardiovascular;  Laterality: N/A;   HPI:  67 y.o. male with medical history significant of HTN, HLD, severe PVD, CAD s/p CABG, CVA with residual left-sided weakness, DM type II, and tobacco abuse; who presents with complaints of worsening bilateral foot  pain and gangrene of the toes bilaterally, right worse than left.  Vascular surgery was consulted.  Patient was transferred to [facility] for vascular interventions. Bedside swallow evaluation ordered due to RN reports of oral holding   Assessment / Plan / Recommendation Clinical Impression   Pt presents with atypical but functional swallow.  Pt's mastication is slow but effective for clearing solids from the oral cavity.   Pt reports he is a "slow eater" at baseline and presented with a prolonged oral phase on last bedside swallow evaluation in 2018.  Pt reports that he has gradually stopped eating most meats given difficulty with mastication.  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  No overt s/s of aspiration were evident with solids or liquids.  Recommend downgrading pt to Dys 3 textures and thin liquids per pt preference as these consistencies are closest to pt's baseline diet and most comfortable for pt.   No further ST needs indicated at this time although pt would benefit from close supervision during meals due  SLP Visit Diagnosis: Dysphagia, oral phase (R13.11)    Aspiration Risk  Mild aspiration risk    Diet Recommendation Dysphagia 3 (Mech soft);Thin liquid   Liquid Administration via: Cup;Straw Medication Administration: Whole meds  with liquid Supervision: Patient able to self feed;Intermittent supervision to cue for compensatory strategies Compensations: Slow rate;Minimize environmental distractions;Small sips/bites Postural Changes: Seated upright at 90 degrees    Other  Recommendations Oral Care Recommendations: Oral care BID   Follow up Recommendations None      Frequency and Duration             Prognosis        Swallow Study   General HPI: 67 y.o. male with medical history significant of HTN, HLD, severe PVD, CAD s/p CABG, CVA with residual left-sided weakness, DM type II, and tobacco abuse; who presents with complaints of worsening bilateral foot pain and gangrene of the toes bilaterally, right worse than left.  Vascular surgery was consulted.  Patient was transferred to [facility] for vascular interventions. Bedside swallow evaluation ordered due to RN reports of oral holding Type of Study: Bedside Swallow Evaluation Previous Swallow Assessment: 2018, dys 3, thin liquids Diet Prior to this Study: Regular;Thin liquids Temperature Spikes Noted: No Respiratory Status: Room air History of Recent Intubation: No Behavior/Cognition: Alert;Cooperative Oral Cavity Assessment: Within Functional Limits Oral Care Completed by SLP: No Oral Cavity - Dentition: Missing dentition;Poor condition Vision: Functional for self-feeding Self-Feeding Abilities: Able to feed self;Needs assist Patient Positioning: Upright in bed Baseline Vocal Quality: Low vocal intensity Volitional Cough: Strong Volitional Swallow: Able to elicit    Oral/Motor/Sensory Function Overall Oral Motor/Sensory Function: Within functional limits   Ice Chips     Thin Liquid Thin Liquid: Within functional limits    Nectar Thick     Honey Thick     Puree Puree: Within functional limits   Solid   GO   Solid: Impaired Presentation: Self Fed Oral Phase Impairments: Impaired mastication Oral Phase Functional Implications: Prolonged oral transit        Jackalyn Lombard L 12/31/2017,3:25 PM

## 2017-12-31 NOTE — Evaluation (Signed)
Physical Therapy Evaluation Patient Details Name: Jordan Gardner MRN: [id number] DOB: 1951/08/14 Today's Date: 12/31/2017   History of Present Illness  67 y.o. male with medical history significant of HTN, HLD, severe PVD, CAD s/p CABG, CVA with residual left-sided weakness, DM type II, and tobacco abuse; who presents with complaints of worsening bilateral foot pain and gangrene of the toes bilaterally, right worse than left. Painful dry gangrene both feet.. Ortho recommending BLE amputations.   Clinical Impression  Pt admitted with above diagnosis. Pt currently with functional limitations due to the deficits listed below (see PT Problem List). Patient requiring up to total assistance for bed mobility at this time; unable to perform out of bed transfer secondary to poor sitting balance (posterior and right lateral lean) requiring heavy assist to maintain upright. Will need maximove to mobilize to chair; Geomat cushion ordered. Patient also with right heel ulcer noted, therefore ordered Prevalon boots  (per RN, patient refusing amputations at this time).  Pt will benefit from skilled PT to increase their independence and safety with mobility to allow discharge to the venue listed below.       Follow Up Recommendations SNF    Equipment Recommendations  None recommended by PT    Recommendations for Other Services       Precautions / Restrictions Precautions Precautions: Fall Required Braces or Orthoses: Other Brace/Splint((ordered Prevalon boots)) Restrictions Weight Bearing Restrictions: No      Mobility  Bed Mobility Overal bed mobility: Needs Assistance Bed Mobility: Rolling;Sidelying to Sit;Sit to Sidelying Rolling: Max assist Sidelying to sit: Mod assist     Sit to sidelying: Total assist;+2 for physical assistance    Transfers Overall transfer level: Needs assistance   Transfers: Sit to/from Stand Sit to Stand: Total assist;+2 physical assistance         General  transfer comment: Attempted sit -stand.Pt unable to tolerate at this time  Ambulation/Gait                Stairs            Wheelchair Mobility    Modified Rankin (Stroke Patients Only)       Balance Overall balance assessment: Needs assistance   Sitting balance-Leahy Scale: Poor Sitting balance - Comments: R/Post lean                                     Pertinent Vitals/Pain Pain Assessment: 0-10 Pain Score: 6  Pain Location: bilateral feet and coccyx Pain Descriptors / Indicators: Discomfort;Grimacing Pain Intervention(s): Limited activity within patient's tolerance;Monitored during session    Home Living Family/patient expects to be discharged to:: Skilled nursing facility Living Arrangements: Other relatives(sister) Available Help at Discharge: Family Type of Home: House Home Access: Stairs to enter Entrance Stairs-Rails: Doctor, general practice of Steps: 4 Home Layout: One level Home Equipment: Environmental consultant - 2 wheels;Cane - single point;Shower seat      Prior Function Level of Independence: Needs assistance   Gait / Transfers Assistance Needed: patient uses both a SPC and RW at home. Patient says he uses a RW to get to the bathroom  ADL's / Homemaking Assistance Needed: needs assistance with ADL's from home health aide(states he has difficulty feeding hiself)  Comments: has a home health aide 3 times a week for one hour. States he has had 6 falls within the past month.      Hand Dominance   Dominant Hand:  Right    Extremity/Trunk Assessment   Upper Extremity Assessment Upper Extremity Assessment: Generalized weakness;LUE deficits/detail(very slowly completing hand to mouth) LUE Deficits / Details: residaul weakness form stroke but using as functonal assist    Lower Extremity Assessment Lower Extremity Assessment: Generalized weakness(B feet - gangrene; decubitus ulcer on R heel)    Cervical / Trunk  Assessment Cervical / Trunk Assessment: Other exceptions Cervical / Trunk Exceptions: posterior and R lateral lean  Communication   Communication: No difficulties  Cognition Arousal/Alertness: Awake/alert Behavior During Therapy: Flat affect Overall Cognitive Status: No family/caregiver present to determine baseline cognitive functioning                                 General Comments: most likely baseline cognitive deificts      General Comments      Exercises     Assessment/Plan    PT Assessment Patient needs continued PT services  PT Problem List Decreased strength;Decreased range of motion;Decreased activity tolerance;Decreased balance;Decreased mobility;Decreased cognition;Decreased safety awareness;Decreased skin integrity;Pain;Impaired sensation       PT Treatment Interventions DME instruction;Gait training;Functional mobility training;Therapeutic activities;Therapeutic exercise;Balance training;Patient/family education    PT Goals (Current goals can be found in the Care Plan section)  Acute Rehab PT Goals Patient Stated Goal: to not have pain PT Goal Formulation: With patient Time For Goal Achievement: 01/14/18 Potential to Achieve Goals: Poor    Frequency Min 2X/week   Barriers to discharge Decreased caregiver support      Co-evaluation PT/OT/SLP Co-Evaluation/Treatment: Yes Reason for Co-Treatment: Complexity of the patient's impairments (multi-system involvement);Necessary to address cognition/behavior during functional activity;For patient/therapist safety;To address functional/ADL transfers PT goals addressed during session: Mobility/safety with mobility OT goals addressed during session: ADL's and self-care       AM-PAC PT "6 Clicks" Daily Activity  Outcome Measure Difficulty turning over in bed (including adjusting bedclothes, sheets and blankets)?: Unable Difficulty moving from lying on back to sitting on the side of the bed? :  Unable Difficulty sitting down on and standing up from a chair with arms (e.g., wheelchair, bedside commode, etc,.)?: Unable Help needed moving to and from a bed to chair (including a wheelchair)?: Total Help needed walking in hospital room?: Total Help needed climbing 3-5 steps with a railing? : Total 6 Click Score: 6    End of Session   Activity Tolerance: Patient limited by fatigue Patient left: in bed;with call bell/phone within reach;with bed alarm set Nurse Communication: Mobility status PT Visit Diagnosis: Other abnormalities of gait and mobility (R26.89);Muscle weakness (generalized) (M62.81);Adult, failure to thrive (R62.7)    Time: 1610-9604 PT Time Calculation (min) (ACUTE ONLY): 30 min   Charges:   PT Evaluation $PT Eval Moderate Complexity: 1 Mod     PT G Codes:        Laurina Bustle, PT, DPT Acute Rehabilitation Services  Pager: [phone]   Vanetta Mulders 12/31/2017, 3:48 PM

## 2017-12-31 NOTE — Clinical Social Work Note (Signed)
Clinical Social Work Assessment  Patient Details  Name: Jordan Gardner MRN: [id number] Date of Birth: September 27, 1950  Date of referral:  12/30/17               Reason for consult:  Facility Placement, Discharge Planning                Permission sought to share information with:  Family Supports Permission granted to share information::  Yes, Verbal Permission Granted  Name::     Psychologist, forensic and Geologist, engineering::     Relationship::  Ms. Jordan Gardner - [facility] mental health case manager and Ms. Jordan Gardner VA social worker  Contact Information:  Ms. Jordan Gardner - [phone] and Ms. Jordan Gardner -[phone]  Housing/Transportation Living arrangements for the past 2 months:  Single Family Home Source of Information:  Patient([facility] case manager Ms. Jordan Gardner) Patient Interpreter Needed:  None Criminal Activity/Legal Involvement Pertinent to Current Situation/Hospitalization:  No - Comment as needed Significant Relationships:  Siblings, Community Support(VA case Production designer, theatre/television/film and Child psychotherapist) Lives with:  Siblings(Sister Jordan Gardner) Do you feel safe going back to the place where you live?  No(Per Ms. Jordan Gardner, patient not safe to discharge home) Need for family participation in patient care:  Yes (Comment)(Also VA staff)  Care giving concerns:  VA case manager indicated on 5/15 that  Jordan Gardner cannot discharge home from the hospital as his sister cannot adequately care for him.    Social Worker assessment / plan:  CSW talked with Ms. Jordan Gardner and patient at the bedside on 15/19 regarding patient's discharge plan. Ms. Jordan Gardner introduced herself and provided her phone number along with patient's VA social worker name and number. Ms. Jordan Gardner expressed her concerns regarding patient going home at discharge and that their (patient and sister's) home is older and cannot accommodate a wheelchair and that his sister is older than Jordan Gardner and would be unable to adequately care for him at  discharge.  Jordan Gardner greeted SW and was quiet during most of the conversation. When asked, he did give CSW permission to talk with VA social worker and case Production designer, theatre/television/film. He also did not disagree to going to a facility for rehab when asked.  Employment status:  Retired Database administrator, [employer] PT Recommendations:  Skilled Nursing Facility Information / Referral to community resources:  Other (Comment Required)(Skilled facility list not provided on 5/15)  Patient/Family's Response to care:  No concerns expressed regarding patient's care during hospitalization.  Patient/Family's Understanding of and Emotional Response to Diagnosis, Current Treatment, and Prognosis: Ms. Jordan Gardner expressed concerns regarding patient going home and being properly cared for by patient's older sister.  Emotional Assessment Appearance:  Appears older than stated age Attitude/Demeanor/Rapport:  Other(Quiet unless asked question directly) Affect (typically observed):  Quiet, Appropriate Orientation:  Oriented to Self, Oriented to Situation, Oriented to Place, Oriented to  Time Alcohol / Substance use:  Tobacco Use, Alcohol Use, Illicit Drugs(Patient reports that he smokes and does not drink or use illicit drugs) Psych involvement (Current and /or in the community):  No (Comment)  Discharge Needs  Concerns to be addressed:  Discharge Planning Concerns, Financial / Insurance Concerns Readmission within the last 30 days:  No Current discharge risk:  None Barriers to Discharge:  Continued Medical Work up, Pension scheme manager), Insurance Authorization(Determining primary insurance coverage)   Jordan Goldmann, LCSW 12/31/2017, 6:16 PM

## 2017-12-31 NOTE — Consult Note (Signed)
ORTHOPAEDIC CONSULTATION  REQUESTING PHYSICIAN: Osvaldo Shipper, MD  Chief Complaint: Painful dry gangrene both feet.  HPI: Jordan Gardner is a 67 y.o. male who presents with severe peripheral vascular disease and type 2 diabetes.  Patient has been seen by cardiology and most recently evaluated with arteriogram studies with vascular vein surgery.  Patient has significant disease and does not have any safe revascularization options.  Past Medical History:  Diagnosis Date  . Anxiety   . Arthritis   . Asthma   . Coronary artery disease   . Depression   . Diabetes mellitus without complication (HCC)    type 2  . Headache   . Hyperlipidemia   . Hypertension   . Myocardial infarction (HCC)   . Neuromuscular disorder (HCC)   . PAD (peripheral artery disease) (HCC)   . Protein calorie malnutrition (HCC) 04/2017  . Psychotic affective disorder (HCC)    Per sister. Unsure of Dx. Pt sleeps late and difficult to wake. odd affect  . Sacral decubitus ulcer 04/2017  . Stroke [facility])    left side weakness   Past Surgical History:  Procedure Laterality Date  . ABDOMINAL AORTOGRAM W/LOWER EXTREMITY N/A 06/08/2017   Procedure: ABDOMINAL AORTOGRAM W/LOWER EXTREMITY;  Surgeon: Chuck Hint, MD;  Location: [facility] INVASIVE CV LAB;  Service: Cardiovascular;  Laterality: N/A;  . BACK SURGERY    . CARDIAC CATHETERIZATION    . CARDIAC SURGERY     CABG x5 2012 - [facility]  . CAROTID-SUBCLAVIAN BYPASS GRAFT Left 07/23/2017   Procedure: BYPASS SUBCLAVIAN ARTERY  USING VIABAHN GORE STENT GRAFT;  Surgeon: Chuck Hint, MD;  Location: [facility] OR;  Service: Vascular;  Laterality: Left;  . LEFT HEART CATH AND CORS/GRAFTS ANGIOGRAPHY N/A 06/26/2017   Procedure: LEFT HEART CATH AND CORS/GRAFTS ANGIOGRAPHY;  Surgeon: Tonny Bollman, MD;  Location: [facility] INVASIVE CV LAB;  Service: Cardiovascular;  Laterality: N/A;   Social History   Socioeconomic History  . Marital status: Single    Spouse name:  Not on file  . Number of children: Not on file  . Years of education: Not on file  . Highest education level: Not on file  Occupational History  . Not on file  Social Needs  . Financial resource strain: Not on file  . Food insecurity:    Worry: Not on file    Inability: Not on file  . Transportation needs:    Medical: Not on file    Non-medical: Not on file  Tobacco Use  . Smoking status: Current Every Day Smoker    Packs/day: 1.00    Types: Cigarettes  . Smokeless tobacco: Never Used  Substance and Sexual Activity  . Alcohol use: No  . Drug use: No  . Sexual activity: Not on file  Lifestyle  . Physical activity:    Days per week: Not on file    Minutes per session: Not on file  . Stress: Not on file  Relationships  . Social connections:    Talks on phone: Not on file    Gets together: Not on file    Attends religious service: Not on file    Active member of club or organization: Not on file    Attends meetings of clubs or organizations: Not on file    Relationship status: Not on file  Other Topics Concern  . Not on file  Social History Narrative  . Not on file   Family History  Problem Relation Age of Onset  .  Hypertension Sister    - negative except otherwise stated in the family history section No Known Allergies Prior to Admission medications   Medication Sig Start Date End Date Taking? Authorizing Provider  aspirin EC 81 MG EC tablet Take 1 tablet (81 mg total) by mouth daily. 04/30/17   Cleora Fleet, MD  aspirin-acetaminophen-caffeine (EXCEDRIN MIGRAINE) [phone] MG tablet Take 2 tablets by mouth every 8 (eight) hours as needed for headache.    [provider]  atorvastatin (LIPITOR) 80 MG tablet Take 80 mg by mouth at bedtime.    [provider]  docusate sodium (COLACE) 100 MG capsule Take 100 mg by mouth daily as needed for mild constipation.    [provider]  feeding supplement, GLUCERNA SHAKE, (GLUCERNA SHAKE) LIQD  Take 237 mLs by mouth 2 (two) times daily between meals. 04/30/17   Johnson, Clanford L, MD  furosemide (LASIX) 40 MG tablet Take 1 tablet (40 mg total) by mouth daily. 08/05/17   Hongalgi, Maximino Greenland, MD  gabapentin (NEURONTIN) 100 MG capsule Take 1 capsule (100 mg total) by mouth 2 (two) times daily. 08/04/17   Elease Etienne, MD  isosorbide mononitrate (IMDUR) 30 MG 24 hr tablet Take 2 tablets (60 mg total) daily by mouth. 07/01/17   Arty Baumgartner, NP  lisinopril (PRINIVIL,ZESTRIL) 2.5 MG tablet Take 1 tablet (2.5 mg total) daily by mouth. 07/02/17   Arty Baumgartner, NP  megestrol (MEGACE) 20 MG tablet Take 20 mg by mouth daily.    [provider]  metFORMIN (GLUCOPHAGE) 500 MG tablet Take 1,000 mg by mouth 2 (two) times daily with a meal.    [provider]  metoprolol tartrate (LOPRESSOR) 50 MG tablet Take 1 tablet (50 mg total) 2 (two) times daily by mouth. Patient taking differently: Take 25 mg by mouth 2 (two) times daily.  07/01/17   Arty Baumgartner, NP  mirtazapine (REMERON) 15 MG tablet Take 15 mg by mouth at bedtime.    [provider]  Multiple Vitamin (MULTIVITAMIN WITH MINERALS) TABS tablet Take 1 tablet by mouth daily. 04/30/17   Johnson, Clanford L, MD  mupirocin ointment (BACTROBAN) 2 % Apply 1 application topically 3 (three) times daily. 10/22/17   Elvina Sidle, MD  nicotine (NICODERM CQ - DOSED IN MG/24 HOURS) 14 mg/24hr patch Place 1 patch (14 mg total) onto the skin daily. 04/30/17   Johnson, Clanford L, MD  nitroGLYCERIN (NITROSTAT) 0.4 MG SL tablet Place 1 tablet (0.4 mg total) under the tongue every 5 (five) minutes x 3 doses as needed for chest pain. 04/29/17   Johnson, Clanford L, MD  oxyCODONE (OXY IR/ROXICODONE) 5 MG immediate release tablet Take 1 tablet (5 mg total) by mouth every 6 (six) hours as needed for moderate pain. 08/04/17   Hongalgi, Maximino Greenland, MD  oxyCODONE-acetaminophen (PERCOCET/ROXICET) 5-325 MG tablet Take 1 tablet by  mouth 2 (two) times daily.    [provider]  sertraline (ZOLOFT) 100 MG tablet Take 200 mg by mouth daily. DEPRESSION/ANXIETY    [provider]   No results found. - pertinent xrays, CT, MRI studies were reviewed and independently interpreted  Positive ROS: All other systems have been reviewed and were otherwise negative with the exception of those mentioned in the HPI and as above.  Physical Exam: General: Alert, no acute distress Psychiatric: Patient is competent for consent with normal mood and affect Lymphatic: No axillary or cervical lymphadenopathy Cardiovascular: No pedal edema Respiratory: No cyanosis, no use  of accessory musculature GI: No organomegaly, abdomen is soft and non-tender    Images:  @  Labs:  Lab Results  Component Value Date   HGBA1C 7.1 (H) 07/28/2017   HGBA1C 7.7 (H) 06/26/2017   HGBA1C 7.4 (H) 04/25/2017   REPTSTATUS 12/30/2017 FINAL 12/25/2017   REPTSTATUS 12/30/2017 FINAL 12/25/2017   GRAMSTAIN  07/28/2017    ABUNDANT WBC PRESENT,BOTH PMN AND MONONUCLEAR NO ORGANISMS SEEN    CULT  12/25/2017    NO GROWTH 5 DAYS Performed at [facility], [address], [place], [place] [address]    CULT  12/25/2017    NO GROWTH 5 DAYS Performed at [facility], [address], [place], [place] [address]     Lab Results  Component Value Date   ALBUMIN 2.4 (L) 12/25/2017   ALBUMIN 3.3 (L) 07/28/2017   ALBUMIN 2.9 (L) 07/23/2017    Neurologic: Patient does not have protective sensation bilateral lower extremities.   MUSCULOSKELETAL:   Skin: Examination patient has dry gangrene of the forefoot bilaterally.  There is no drainage no cellulitis no signs of infection.  Patient has good turgor in the heel pad.  His legs are thin and atrophic.  Patient does not have palpable pulses.    Assessment: Assessment: Diabetic insensate neuropathy with severe peripheral vascular disease without revascularization options  with gangrene of both feet.  Plan: Plan: Discussed with the patient is only option would be to proceed with a amputation surgery.  Discussed that it would be worth a try to see if he can heel and a transtibial amputation but most likely patient would require an above-knee amputation bilaterally.  Discussed that this would be for pain control he does not have active infection at this time.  Patient states he needs time to think about this he would like some pain medicine to help him with the ischemic pain and I will follow-up in the office in 1 week.  Thank you for the consult and the opportunity to see Mr. Otilio Groleau, MD [facility] [phone] 8:08 AM

## 2017-12-31 NOTE — Progress Notes (Signed)
Occupational Therapy Evaluation Patient Details Name: Ethel Veronica MRN: [id number] DOB: 07/17/1951 Today's Date: 12/31/2017    History of Present Illness 67 y.o. male with medical history significant of HTN, HLD, severe PVD, CAD s/p CABG, CVA with residual left-sided weakness, DM type II, and tobacco abuse; who presents with complaints of worsening bilateral foot pain and gangrene of the toes bilaterally, right worse than left. Painful dry gangrene both feet.. Ortho recommending BLE amputations.    Clinical Impression   PTA, pt lived at home with elderly sister and states he had a PCA 3 days/wk to assist with self care. Pt reports gradual decline in mobility. Pt currently requires total A +2 with attempts at sit- stand and staff will need to use a maximove to mobilize to chair with Geomat cushion due to sacral wound. Pt with wound on R heel and will need B PRAFO boots if amputations are not completed. Pt will also need assistance from staff with feeding self. Recommend rehab at SNF. Will follow acutley to address established goals and facilitate DC to next venue of care.     Follow Up Recommendations  SNF;Supervision/Assistance - 24 hour    Equipment Recommendations  Other (comment)(TBA at SNF)    Recommendations for Other Services       Precautions / Restrictions Precautions Precautions: Fall Required Braces or Orthoses: (needs B PRAFO boots if refuses amputations) Restrictions Weight Bearing Restrictions: No      Mobility Bed Mobility Overal bed mobility: Needs Assistance Bed Mobility: Rolling;Sidelying to Sit Rolling: Max assist Sidelying to sit: Mod assist          Transfers Overall transfer level: Needs assistance   Transfers: Sit to/from Stand Sit to Stand: Total assist;+2 physical assistance         General transfer comment: Attempted sit -stand.Pt unable to tolerate at this time    Balance Overall balance assessment: Needs assistance   Sitting  balance-Leahy Scale: Poor Sitting balance - Comments: R/Post lean                                   ADL either performed or assessed with clinical judgement   ADL Overall ADL's : Needs assistance/impaired Eating/Feeding: Moderate assistance Eating/Feeding Details (indicate cue type and reason): speech eval for "holding foods"; states he has difficulty feeding himself/gettin gfood to his mouth; dropping spoons/forks Grooming: Maximal assistance   Upper Body Bathing: Maximal assistance;Bed level   Lower Body Bathing: Total assistance;Bed level   Upper Body Dressing : Maximal assistance;Sitting   Lower Body Dressing: Total assistance;Bed level               Functional mobility during ADLs: Total assistance;+2 for safety/equipment       Vision         Perception     Praxis      Pertinent Vitals/Pain Pain Assessment: 0-10 Pain Score: 6  Pain Location: bilateral feet and coccyx Pain Descriptors / Indicators: Discomfort;Grimacing Pain Intervention(s): Limited activity within patient's tolerance;Repositioned     Hand Dominance Right   Extremity/Trunk Assessment Upper Extremity Assessment Upper Extremity Assessment: Generalized weakness;LUE deficits/detail(very slowly completing hand to mouth) LUE Deficits / Details: residaul eakness form stroke but using as funcitonal assist   Lower Extremity Assessment Lower Extremity Assessment: Defer to PT evaluation(B feet - gangrene; decubitus ulcer on R heel)   Cervical / Trunk Assessment Cervical / Trunk Assessment: Other exceptions Cervical / Trunk Exceptions:  posterior adn R lateral lean   Communication Communication Communication: No difficulties   Cognition Arousal/Alertness: Awake/alert Behavior During Therapy: Flat affect Overall Cognitive Status: No family/caregiver present to determine baseline cognitive functioning                                 General Comments: most likely  baseline cognitive deificts   General Comments       Exercises     Shoulder Instructions      Home Living Family/patient expects to be discharged to:: Skilled nursing facility Living Arrangements: Other relatives(sister) Available Help at Discharge: Family Type of Home: House Home Access: Stairs to enter [employer] of Steps: 4 Entrance Stairs-Rails: Right;Left Home Layout: One level     Bathroom Shower/Tub: Chief Strategy Officer: Standard     Home Equipment: Environmental consultant - 2 wheels;Cane - single point;Shower seat          Prior Functioning/Environment Level of Independence: Needs assistance  Gait / Transfers Assistance Needed: patient uses both a SPC and RW at home. Patient says he uses a RW to get to the bathroom ADL's / Homemaking Assistance Needed: needs assistance with ADL's from home health aide(states he has difficulty feeding hiself)   Comments: has a home health aide 3 times a week for one hour. States he has had 6 falls within the past month.         OT Problem List: Decreased strength;Decreased range of motion;Decreased activity tolerance;Impaired balance (sitting and/or standing);Decreased coordination;Decreased cognition;Decreased safety awareness;Decreased knowledge of use of DME or AE;Impaired sensation;Impaired UE functional use;Pain;Increased edema      OT Treatment/Interventions: Self-care/ADL training;Therapeutic exercise;DME and/or AE instruction;Therapeutic activities;Cognitive remediation/compensation;Patient/family education;Balance training    OT Goals(Current goals can be found in the care plan section) Acute Rehab OT Goals Patient Stated Goal: to not have pain OT Goal Formulation: With patient Time For Goal Achievement: 01/14/18 Potential to Achieve Goals: Fair  OT Frequency: Min 2X/week   Barriers to D/C: Decreased caregiver support          Co-evaluation PT/OT/SLP Co-Evaluation/Treatment: Yes Reason for  Co-Treatment: Complexity of the patient's impairments (multi-system involvement);For patient/therapist safety;To address functional/ADL transfers   OT goals addressed during session: ADL's and self-care      AM-PAC PT "6 Clicks" Daily Activity     Outcome Measure Help from another person eating meals?: A Lot Help from another person taking care of personal grooming?: A Lot Help from another person toileting, which includes using toliet, bedpan, or urinal?: Total Help from another person bathing (including washing, rinsing, drying)?: Total Help from another person to put on and taking off regular upper body clothing?: Total Help from another person to put on and taking off regular lower body clothing?: Total 6 Click Score: 8   End of Session Equipment Utilized During Treatment: Gait belt Nurse Communication: Mobility status;Other (comment);Need for lift equipment(need for Prevalon boots; need to assist with feeding; maximo)  Activity Tolerance: Patient limited by fatigue Patient left: in bed;with call bell/phone within reach;with bed alarm set  OT Visit Diagnosis: Other abnormalities of gait and mobility (R26.89);Repeated falls (R29.6);Muscle weakness (generalized) (M62.81);Other symptoms and signs involving cognitive function;Feeding difficulties (R63.3);Dizziness and giddiness (R42);Hemiplegia and hemiparesis;Pain Hemiplegia - Right/Left: Left Pain - part of body: Ankle and joints of foot(B)                Time: 1610-9604 OT Time Calculation (min): 26 min  Charges:  OT General Charges $OT Visit: 1 Visit OT Evaluation $OT Eval Moderate Complexity: 1 Mod G-Codes:     Luisa Dago, OT/L  OT Clinical Specialist [phone]   [facility] 12/31/2017, 3:19 PM

## 2017-12-31 NOTE — Clinical Social Work Note (Signed)
During record review, CSW noted that patient is showing [place] and [employer]. Call will be made to financial counselor to determine primary coverage. Call made to [facility] case manager and message left to discuss patient's discharge and insurance concerns.   Genelle Bal, MSW, LCSW Licensed Clinical Social Worker Clinical Social Work Department [employer] [phone]

## 2017-12-31 NOTE — Progress Notes (Signed)
[facility] PROGRESS NOTE  Kameren Lorella Nimrod ZOX:[id number] DOB: 1951-04-11 DOA: 12/25/2017  PCP: [facility]  Brief History/Interval Summary: 67 y.o.malewith medical history significant ofHTN, HLD,severePVD, CADs/p CABG, CVA with residual left-sided weakness, DM type II, and tobacco abuse;who presents with complaints of worsening bilateral foot pain and gangrene of the toes bilaterally, right worse than left.  Vascular surgery was consulted.  Patient was transferred to [facility] for vascular interventions.  Reason for Visit: Gangrene involving bilateral feet  Consultants:  Vascular surgery.   Orthopedics: Dr. Lajoyce Corners  Procedures: None  Antibiotics: Vancomycin and Zosyn to be discontinued on 5/16.  Will be started on Keflex.  Subjective/Interval History: Patient states that he feels well.  Does report poor appetite.  Pain in the feet is well controlled.  Denies any chest pain or shortness of breath.    ROS: Denies any headaches.  Objective:  Vital Signs  Vitals:   12/30/17 1632 12/30/17 2150 12/31/17 0533 12/31/17 0742  BP: 134/79 134/89 (!) 154/77 (!) 151/81  Pulse: 88 88 91 95  Resp: Temp: 98.9 F (37.2 C) 98.1 F (36.7 C) 97.7 F (36.5 C) (!) 97.5 F (36.4 C)  TempSrc: Oral Oral Oral Oral  SpO2: 95% 93% 100% 92%  Weight:   79.4 kg (175 lb)   Height:        Intake/Output Summary (Last 24 hours) at 12/31/2017 0903 Last data filed at 12/31/2017 0600 Gross per 24 hour  Intake 1790 ml  Output 0 ml  Net 1790 ml   Filed Weights   12/26/17 2027 12/28/17 2039 12/31/17 0533  Weight: 74.2 kg (163 lb 9.3 oz) 74.2 kg (163 lb 9.3 oz) 79.4 kg (175 lb)    General appearance: Awake alert.  In no distress. Resp: Clear to auscultation bilaterally.  No wheezing rales or rhonchi. Cardio: S1-S2 is normal regular.  No S3-S4.  No rubs murmurs or bruit. GI: Soft.  Nontender nondistended.  Bowel sounds are present.  No masses  organomegaly. Extremities: Gangrenous changes noted both feet involving the toes bilaterally.  Foul smell is present.  Poorly palpable pulse.  No changes compared to yesterday. Pulses: Poorly palpable pulses in both lower extremities. Neurologic: Focal neurological deficits.  Lab Results:  Data Reviewed: I have personally reviewed following labs and imaging studies  CBC: Recent Labs  Lab 12/25/17 1735 12/26/17 0531 12/30/17 0417  WBC 16.6* 15.7* 17.6*  NEUTROABS 13.2*  --   --   HGB 11.5* 11.3* 11.6*  HCT 35.7* 35.3* 37.1*  MCV 80.0 80.0 79.8  PLT 243 246 218    Basic Metabolic Panel: Recent Labs  Lab 12/25/17 1735 12/26/17 0531 12/30/17 1027  NA 136 136 136  K 4.8 4.6 4.7  CL 105 106 107  CO2 22 20* 22  GLUCOSE 186* 151* 157*  BUN 35* 33* 20  CREATININE 1.49* 1.28* 1.07  CALCIUM 8.3* 8.3* 8.2*  MG  --  1.7  --     GFR: Estimated Creatinine Clearance: 76.3 mL/min (by C-G formula based on SCr of 1.07 mg/dL).  Liver Function Tests: Recent Labs  Lab 12/25/17 1735  AST 32  ALT 28  ALKPHOS 209*  BILITOT 0.6  PROT 6.0*  ALBUMIN 2.4*    Coagulation Profile: Recent Labs  Lab 12/25/17 1735 12/26/17 0531  INR 1.31 1.34    CBG: Recent Labs  Lab 12/29/17 2136 12/30/17 0811 12/30/17 1134 12/30/17 1634 12/30/17 2149  GLUCAP 158* 166* 140* 128* 96  Recent Results (from the past 240 hour(s))  Blood culture (routine x 2)     Status: None   Collection Time: 12/25/17  5:35 PM  Result Value Ref Range Status   Specimen Description   Final    BLOOD LEFT FOREARM Performed at [facility], [address], [place], [place] [address]    Special Requests   Final    BOTTLES DRAWN AEROBIC AND ANAEROBIC Blood Culture adequate volume Performed at [facility], [address], [place], [place] [address]    Culture   Final    NO GROWTH 5 DAYS Performed at [facility], [address] [place], [place] [address]     Report Status 12/30/2017 FINAL  Final  Blood culture (routine x 2)     Status: None   Collection Time: 12/25/17  5:35 PM  Result Value Ref Range Status   Specimen Description   Final    BLOOD RIGHT FOREARM Performed at [facility], [address] [place], [place] [address]    Special Requests   Final    BOTTLES DRAWN AEROBIC AND ANAEROBIC Blood Culture adequate volume Performed at [facility], [address], [place], [place] [address]    Culture   Final    NO GROWTH 5 DAYS Performed at [facility], [address], [place], [place] [address]    Report Status 12/30/2017 FINAL  Final  Surgical pcr screen     Status: None   Collection Time: 12/26/17 11:56 PM  Result Value Ref Range Status   MRSA, PCR NEGATIVE NEGATIVE Final   Staphylococcus aureus NEGATIVE NEGATIVE Final    Comment: (NOTE) The Xpert SA Assay (FDA approved for NASAL specimens in patients 63 years of age and older), is one component of a comprehensive surveillance program. It is not intended to diagnose infection nor to guide or monitor treatment. Performed at [facility], [address], [place], [place] [address]       Radiology Studies: No results found.   Medications:  Scheduled: . aspirin EC  81 mg Oral Daily  . atorvastatin  80 mg Oral QHS  . cephALEXin  500 mg Oral Q8H  . enoxaparin (LOVENOX) injection  40 mg Subcutaneous QHS  . feeding supplement (ENSURE ENLIVE)  237 mL Oral TID BM  . gabapentin  100 mg Oral BID  . insulin aspart  0-5 Units Subcutaneous QHS  . insulin aspart  0-9 Units Subcutaneous TID WC  . insulin glargine  8 Units Subcutaneous QHS  . isosorbide mononitrate  60 mg Oral Daily  . multivitamin with minerals  1 tablet Oral Daily  . mupirocin ointment  1 application Topical TID  . nicotine  21 mg Transdermal Daily   Continuous: . 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  They think that the patient is not a good candidate for revascularization.  He has multivessel disease and any options for revascularization would involve significant risk.  Seen by cardiology recently and thought to be high risk for surgery.  In view of this vascular surgery recommends above-knee amputation for his gangrene.  Seen by Dr. Lajoyce Corners with orthopedics who has previously seen the patient.  Patient to discuss further in the outpatient setting.  No urgent indication for surgery currently.  Change to oral antibiotics with Keflex for now.  Cultures have been negative so far.  Continue aspirin and statin.    Hyperlipidemia Continue Lipitor  Tobacco abuse disorder Continue nicotine patch.   Sacral decubitus ulcer POA Chronic, wound care consulted  AKI (acute kidney injury) Presented with creatinine of 1.49, baseline 1.1.  Patient was given IV fluids.  Creatinine improved to 1.07.  We will stop IV fluids.  Type II diabetes mellitus, uncontrolled with hyperglycemia with peripheral vascular disease Holding metformin.  At this is being continued.  Patient does report poor appetite.  CBG somewhat low this morning we will cut back on the dose of Lantus.  Continue sliding scale coverage.    Coronary artery disease involving coronary bypass graft of native heart Stable.  Continue home medications.  Seen by cardiology recently.  Normocytic anemia Monitor hemoglobin closely.  No evidence of overt bleeding.  Essential hypertension Stable.  Continue to monitor closely.  DVT Prophylaxis: Lovenox    Code Status: Full code Family Communication: Discussed with the patient Disposition Plan: PT and OT as patient will likely need short-term rehab.  Apparently he lives with his  elderly sister and she may not be able to care for him.  Discussed with Child psychotherapist.  Plan will be for the patient to be discharged to skilled nursing facility and then outpatient appointment with orthopedics next week to discuss amputation.    LOS: 6 days   Osvaldo Shipper  [facility] Pager [phone] 12/31/2017, 9:03 AM  If 7PM-7AM, please contact night-coverage at [email or web address], password [facility]

## 2017-12-31 NOTE — NC FL2 (Signed)
[place] MEDICAID FL2 LEVEL OF CARE SCREENING TOOL     IDENTIFICATION  Patient Name: Jordan Gardner Birthdate: Aug 25, 1950 Sex: male Admission Date (Current Location): 12/25/2017  [facility] and [place] Number:  Producer, television/film/video and Address:  [facility] [place], [address] [place], [place] [address]      Provider Number: [id number]  Attending Physician Name and Address:  Osvaldo Shipper, MD  Relative Name and Phone Number:  Georgana Curio - Sister; [phone]    Current Level of Care: Hospital Recommended Level of Care: Skilled Nursing Facility Prior Approval Number:    Date Approved/Denied:   PASRR Number: (Submitted for PASRR- additional clinicals needed)  Discharge Plan: SNF    Current Diagnoses: Patient Active Problem List   Diagnosis Date Noted  . Leukocytosis 12/26/2017  . Essential hypertension 12/26/2017  . Gangrene of foot (HCC) 10/29/2017  . Acute hypoxemic respiratory failure (HCC) 07/28/2017  . 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 Critical lower limb ischemia   . 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 AKI (acute kidney injury) (HCC) 04/28/2017  . Malnutrition of moderate degree 04/28/2017  . Pressure injury of skin 04/27/2017  . Protein calorie malnutrition (HCC) 04/27/2017  . Physical deconditioning 04/27/2017  . Ulcer of great toe (HCC) 04/27/2017  . Claudication (HCC) 04/27/2017  . Sacral decubitus ulcer 04/27/2017  . Accelerated hypertension   . Hyperlipidemia   . Tobacco abuse disorder   . Altered mental status   . PAD (peripheral artery disease) (HCC)   . NSTEMI (non-ST elevated myocardial infarction) (HCC) 04/25/2017  . NECK PAIN 10/16/2008  . BACK PAIN 10/16/2008    Orientation RESPIRATION BLADDER Height & Weight     Self, Time, Situation, Place  Normal Continent Weight: 175 lb (79.4 kg) Height:   (185.4 cm)  BEHAVIORAL SYMPTOMS/MOOD NEUROLOGICAL BOWEL NUTRITION STATUS      Continent Diet(DYS 3)  AMBULATORY STATUS COMMUNICATION OF NEEDS Skin   Total Care(Patient was unable to ambulate with therapist during eval on 5/16) Verbally Other (Comment)(Stage 2 pressure injury to medial coccyx with foam dressing; Abrasion bilateral legs; Fissure medial coccyx)                       Personal Care Assistance Level of Assistance  Bathing, Feeding, Dressing Bathing Assistance: Maximum assistance(Max assist upper body and total assist lower body) Feeding assistance: Maximum assistance(Mod assist per therapist) Dressing Assistance: Maximum assistance(Max assist upper body and total assist lower body)     Functional Limitations Info  Sight, Hearing, Speech Sight Info: Adequate Hearing Info: Adequate Speech Info: Adequate    SPECIAL CARE FACTORS FREQUENCY  PT (By licensed PT), OT (By licensed OT), Speech therapy     PT Frequency: Evaluated 5/16 and a minimum of 2X per week recommended during acute inpatient stay OT Frequency: Evaluated 5/16 and a minimum of 2X per week recommended during acute inpatient stay     Speech Therapy Frequency: Evaluated 5/16 and DYS 3 diet recommended      Contractures Contractures Info: Not present    Additional Factors Info  Code Status, Allergies Code Status Info: Full Allergies Info: No known allergies           Current Medications (12/31/2017):  This is the current hospital active medication list Current Facility-Administered Medications  Medication Dose Route Frequency Provider  Last Rate Last Dose  . acetaminophen (TYLENOL) tablet 650 mg  650 mg Oral Q6H PRN Madelyn Flavors A, MD   650 mg at 12/27/17 1926   Or  . acetaminophen (TYLENOL) suppository 650 mg  650 mg Rectal Q6H PRN Madelyn Flavors A, MD      . aspirin EC tablet 81 mg  81 mg Oral Daily Katrinka Blazing, Rondell A, MD   81 mg at  12/31/17 0858  . atorvastatin (LIPITOR) tablet 80 mg  80 mg Oral QHS Smith, Rondell A, MD   80 mg at 12/30/17 2150  . cephALEXin (KEFLEX) capsule 500 mg  500 mg Oral Q8H Osvaldo Shipper, MD   500 mg at 12/31/17 1320  . enoxaparin (LOVENOX) injection 40 mg  40 mg Subcutaneous QHS Smith, Rondell A, MD   40 mg at 12/30/17 2150  . feeding supplement (ENSURE ENLIVE) (ENSURE ENLIVE) liquid 237 mL  237 mL Oral TID BM Rai, Ripudeep K, MD   237 mL at 12/31/17 1320  . gabapentin (NEURONTIN) capsule 100 mg  100 mg Oral BID Madelyn Flavors A, MD   100 mg at 12/31/17 0858  . insulin aspart (novoLOG) injection 0-5 Units  0-5 Units Subcutaneous QHS Smith, Rondell A, MD      . insulin aspart (novoLOG) injection 0-9 Units  0-9 Units Subcutaneous TID WC Clydie Braun, MD   1 Units at 12/31/17 1803  . insulin glargine (LANTUS) injection 6 Units  6 Units Subcutaneous QHS Osvaldo Shipper, MD      . ipratropium-albuterol (DUONEB) 0.5-2.5 (3) MG/3ML nebulizer solution 3 mL  3 mL Nebulization Q6H PRN Katrinka Blazing, Rondell A, MD      . isosorbide mononitrate (IMDUR) 24 hr tablet 60 mg  60 mg Oral Daily Katrinka Blazing, Rondell A, MD   60 mg at 12/31/17 0858  . morphine 2 MG/ML injection 2 mg  2 mg Intravenous Q3H PRN Madelyn Flavors A, MD   2 mg at 12/31/17 1645  . multivitamin with minerals tablet 1 tablet  1 tablet Oral Daily Rai, Ripudeep K, MD   1 tablet at 12/31/17 0858  . mupirocin ointment (BACTROBAN) 2 % 1 application  1 application Topical TID Clydie Braun, MD   1 application at 12/31/17 1804  . nicotine (NICODERM CQ - dosed in mg/24 hours) patch 21 mg  21 mg Transdermal Daily Madelyn Flavors A, MD   21 mg at 12/31/17 0905  . ondansetron (ZOFRAN) injection 4 mg  4 mg Intravenous Q6H PRN Rai, Ripudeep K, MD   4 mg at 12/29/17 1714  . oxyCODONE (Oxy IR/ROXICODONE) immediate release tablet 5 mg  5 mg Oral Q6H PRN Madelyn Flavors A, MD   5 mg at 12/30/17 1239  . povidone-iodine (BETADINE) 10 % external solution   Topical PRN  Emilie Rutter, PA-C         Discharge Medications: Please see discharge summary for a list of discharge medications.  Relevant Imaging Results:  Relevant Lab Results:   Additional Information ss#[id number]  Cristobal Goldmann, LCSW

## 2018-01-01 LAB — BASIC METABOLIC PANEL
ANION GAP: 10 (ref 5–15)
BUN: 22 mg/dL — ABNORMAL HIGH (ref 6–20)
CALCIUM: 8.7 mg/dL — AB (ref 8.9–10.3)
CHLORIDE: 104 mmol/L (ref 101–111)
CO2: 21 mmol/L — AB (ref 22–32)
Creatinine, Ser: 1.29 mg/dL — ABNORMAL HIGH (ref 0.61–1.24)
GFR calc Af Amer: >60 mL/min (ref >60)
GFR calc non Af Amer: 56 mL/min — ABNORMAL LOW (ref >60)
GLUCOSE: 199 mg/dL — AB (ref 65–99)
POTASSIUM: 5 mmol/L (ref 3.5–5.1)
Sodium: 135 mmol/L (ref 135–145)

## 2018-01-01 LAB — GLUCOSE, CAPILLARY
GLUCOSE-CAPILLARY: 242 mg/dL — AB (ref 65–99)
GLUCOSE-CAPILLARY: 182 mg/dL — AB (ref 65–99)
GLUCOSE-CAPILLARY: 185 mg/dL — AB (ref 65–99)
Glucose-Capillary: 181 mg/dL — ABNORMAL HIGH (ref 65–99)

## 2018-01-01 LAB — CBC
HEMATOCRIT: 37.5 % — AB (ref 39.0–52.0)
HEMOGLOBIN: 11.9 g/dL — AB (ref 13.0–17.0)
MCH: 24.8 pg — ABNORMAL LOW (ref 26.0–34.0)
MCHC: 31.7 g/dL (ref 30.0–36.0)
MCV: 78.3 fL (ref 78.0–100.0)
Platelets: 243 10*3/uL (ref 150–400)
RBC: 4.79 MIL/uL (ref 4.22–5.81)
RDW: 16.5 % — AB (ref 11.5–15.5)
WBC: 16.4 10*3/uL — ABNORMAL HIGH (ref 4.0–10.5)

## 2018-01-01 MED ORDER — OXYCODONE HCL 5 MG PO TABS: 5.0000 mg | ORAL_TABLET | Freq: Four times a day (QID) | ORAL | 0 refills | Status: DC | PRN

## 2018-01-01 MED ORDER — CEPHALEXIN 500 MG PO CAPS: 500.0000 mg | ORAL_CAPSULE | Freq: Three times a day (TID) | ORAL | 0 refills | Status: AC

## 2018-01-01 MED ORDER — METOPROLOL TARTRATE 50 MG PO TABS: 25.0000 mg | ORAL_TABLET | Freq: Two times a day (BID) | ORAL | Status: AC

## 2018-01-01 MED ORDER — HYDRALAZINE HCL 20 MG/ML IJ SOLN
5.0000 mg | Freq: Once | INTRAMUSCULAR | Status: AC
  Administered 2018-01-01: 5 mg via INTRAVENOUS
  Filled 2018-01-01: qty 1

## 2018-01-01 NOTE — Progress Notes (Signed)
PIV x 2 removed.  Patient discharged to [facility] via [facility].  All personal belongings including wheelchair foam pad sent with patient.  Bernie Covey RN-BC, [employer]

## 2018-01-01 NOTE — Discharge Summary (Signed)
[facility]  Physician Discharge Summary   Patient ID: Jordan Gardner MRN: [id number] DOB/AGE: [birth year] 66 y.o.  Admit date: 12/25/2017 Discharge date: 01/01/2018  PCP: [facility]  DISCHARGE DIAGNOSES:  Dry gangrene involving bilateral toes  RECOMMENDATIONS FOR OUTPATIENT FOLLOW UP: 1. Check CBC and basic metabolic panel on a weekly basis starting Monday 2. Patient will need to see Dr. Lajoyce Corners, orthopedic surgeon, within the next 7 days for further discussions regarding the gangrene and possible amputation 3. Monitor CBGs   DISCHARGE CONDITION: fair  Diet recommendation: Modified carbohydrate, heart healthy  Filed Weights   12/28/17 2039 12/31/17 0533 01/01/18 0042  Weight: 74.2 kg (163 lb 9.3 oz) 79.4 kg (175 lb) 79 kg (174 lb 2.6 oz)    INITIAL HISTORY: 67 y.o.malewith medical history significant ofHTN, HLD,severePVD, CADs/p CABG, CVA with residual left-sided weakness, DM type II, and tobacco abuse;who presents with complaints of worsening bilateral foot pain and gangrene of the toes bilaterally, right worse than left.  Vascular surgery was consulted.  Patient was transferred to [facility] for vascular interventions.  Consultations: Vascular surgery.   Orthopedics: Dr. Lajoyce Corners  Procedures: None  HOSPITAL COURSE:   Gangrene of foot in the setting of severe PVD and diabetes mellitus Patient seen by vascular surgery.  They think that the patient is not a good candidate for revascularization.  He has multivessel disease and any options for revascularization would involve significant risk.  Seen by cardiology recently and thought to be high risk for surgery.  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  Seen by Dr. Lajoyce Corners with orthopedics who has previously seen the patient.  Patient to discuss further in the outpatient setting.  No urgent indication for surgery currently.    Continue oral Keflex  for another week.  Patient to see Dr. Lajoyce Corners in his office within the next 1 week.  Cultures have been negative.  Continue aspirin and statin.     Hyperlipidemia Continue Lipitor  Tobacco abuse disorder Continue nicotine patch.   Sacral decubitus ulcer POA Chronic, wound care consulted  AKI (acute kidney injury) Presented with mild kidney injury.  Creatinine has improved.  Seems to be fluctuating some.  He has good urine output.  He could have underlying chronic kidney disease as well.  Type II diabetes mellitus, uncontrolled with hyperglycemia with peripheral vascular disease Monitor CBGs.  He may resume his home medication regimen.   Coronary artery disease involving coronary bypass graft of native heart Stable.  Continue home medications.  Seen by cardiology recently.  Normocytic anemia Monitor hemoglobin closely.  No evidence of overt bleeding.  Essential hypertension Stable.   Overall stable.  Discussed with patient and his sister today.  He could go to skilled nursing facility when a bed is available. Stable from a medical standpoint.    PERTINENT LABS:  The results of significant diagnostics from this hospitalization (including imaging, microbiology, ancillary and laboratory) are listed below for reference.    Microbiology: Recent Results (from the past 240 hour(s))  Blood culture (routine x 2)     Status: None   Collection Time: 12/25/17  5:35 PM  Result Value Ref Range Status   Specimen Description   Final    BLOOD LEFT FOREARM Performed at [facility], [address] [place], [place] [address]    Special Requests   Final    BOTTLES DRAWN AEROBIC AND ANAEROBIC Blood Culture adequate volume Performed at [facility],  [address] [place], [place] [address]    Culture   Final    NO GROWTH 5 DAYS Performed at [facility], [address], [place], [place] [address]    Report Status 12/30/2017 FINAL  Final    Blood culture (routine x 2)     Status: None   Collection Time: 12/25/17  5:35 PM  Result Value Ref Range Status   Specimen Description   Final    BLOOD RIGHT FOREARM Performed at [facility], [address] [place], [place] [address]    Special Requests   Final    BOTTLES DRAWN AEROBIC AND ANAEROBIC Blood Culture adequate volume Performed at [facility], [address], [place], [place] [address]    Culture   Final    NO GROWTH 5 DAYS Performed at [facility], [address], [place], [place] [address]    Report Status 12/30/2017 FINAL  Final  Surgical pcr screen     Status: None   Collection Time: 12/26/17 11:56 PM  Result Value Ref Range Status   MRSA, PCR NEGATIVE NEGATIVE Final   Staphylococcus aureus NEGATIVE NEGATIVE Final    Comment: (NOTE) The Xpert SA Assay (FDA approved for NASAL specimens in patients 47 years of age and older), is one component of a comprehensive surveillance program. It is not intended to diagnose infection nor to guide or monitor treatment. Performed at [facility], [address] [place], [place] [address]      Labs: Basic Metabolic Panel: Recent Labs  Lab 12/25/17 1735 12/26/17 0531 12/30/17 1027 01/01/18 0547  NA 136 136 136 135  K 4.8 4.6 4.7 5.0  CL 105 106 107 104  CO2 22 20* 22 21*  GLUCOSE 186* 151* 157* 199*  BUN 35* 33* 20 22*  CREATININE 1.49* 1.28* 1.07 1.29*  CALCIUM 8.3* 8.3* 8.2* 8.7*  MG  --  1.7  --   --    Liver Function Tests: Recent Labs  Lab 12/25/17 1735  AST 32  ALT 28  ALKPHOS 209*  BILITOT 0.6  PROT 6.0*  ALBUMIN 2.4*   CBC: Recent Labs  Lab 12/25/17 1735 12/26/17 0531 12/30/17 0417 01/01/18 0547  WBC 16.6* 15.7* 17.6* 16.4*  NEUTROABS 13.2*  --   --   --   HGB 11.5* 11.3* 11.6* 11.9*  HCT 35.7* 35.3* 37.1* 37.5*  MCV 80.0 80.0 79.8 78.3  PLT 243 246 218 243    CBG: Recent Labs  Lab 12/31/17 0740 12/31/17 1147 12/31/17 1620  12/31/17 2116 01/01/18 0736  GLUCAP 74 83 138* 92 181*     IMAGING STUDIES Dg Chest 1 View  Result Date: 12/25/2017 CLINICAL DATA:  Proper evaluation for foot amputation, history hypertension, type II diabetes mellitus, stroke, coronary artery disease, smoker EXAM: CHEST  1 VIEW COMPARISON:  Portable exam 1713 hours compared to 07/31/2017 FINDINGS: Upper normal size of cardiac silhouette post CABG. Mediastinal contours and pulmonary vascularity normal. Skin folds project over the lungs bilaterally. Lungs clear. No acute infiltrate, pleural effusion or pneumothorax. Bones unremarkable. IMPRESSION: Post CABG. No acute abnormalities. Electronically Signed   By: Ulyses Southward M.D.   On: 12/25/2017 18:42   Dg Foot Complete Left  Result Date: 12/25/2017 CLINICAL DATA:  Necrotic wounds at both feet EXAM: LEFT FOOT - COMPLETE 3+ VIEW COMPARISON:  10/29/2017 FINDINGS: Mild osseous demineralization. Joint spaces preserved. No acute fracture, dislocation, or bone destruction. Soft tissues unremarkable. IMPRESSION: No acute  abnormalities. Electronically Signed   By: Ulyses Southward M.D.   On: 12/25/2017 18:38   Dg Foot Complete Right  Result Date: 12/25/2017 CLINICAL DATA:  Necrotic wounds at both feet EXAM: RIGHT FOOT COMPLETE - 3+ VIEW COMPARISON:  10/29/2017 FINDINGS: Osseous demineralization. Advanced degenerative changes at first MTP joint. Mild degenerative changes at IP joint great toe. Remaining joint spaces fairly well preserved. No acute fracture, dislocation, or bone destruction. Soft tissues radiographically unremarkable. 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 No acute bony abnormalities. Electronically Signed   By: Ulyses Southward M.D.   On: 12/25/2017 18:39    DISCHARGE EXAMINATION: Vitals:   01/01/18 0042 01/01/18 0458 01/01/18 1042 01/01/18 1043  BP:  (!) 170/96 (!) 152/90   Pulse:  (!) 106 (!) 44 93  Resp:  19 20     Temp:  98 F (36.7 C) 97.6 F (36.4 C)   TempSrc:   Oral   SpO2:  92% 96% 97%  Weight: 79 kg (174 lb 2.6 oz)     Height:       General appearance: alert, cooperative, appears stated age and no distress Resp: clear to auscultation bilaterally Cardio: regular rate and rhythm, S1, S2 normal, no murmur, click, rub or gallop GI: soft, non-tender; bowel sounds normal; no masses,  no organomegaly Extremities: Dry gangrenous changes involving toes on both feet.  DISPOSITION: Skilled nursing facility  Discharge Instructions    Call MD for:  extreme fatigue   Complete by:  As directed    Call MD for:  persistant dizziness or light-headedness   Complete by:  As directed    Call MD for:  persistant nausea and vomiting   Complete by:  As directed    Call MD for:  severe uncontrolled pain   Complete by:  As directed    Call MD for:  temperature >100.4   Complete by:  As directed    Discharge instructions   Complete by:  As directed    Please review instructions on the discharge summary.  You were cared for by a hospitalist during your hospital stay. If you have any questions about your discharge medications or the care you received while you were in the hospital after you are discharged, you can call the unit and asked to speak with the hospitalist on call if the hospitalist that took care of you is not available. Once you are discharged, your primary care physician will handle any further medical issues. Please note that NO REFILLS for any discharge medications will be authorized once you are discharged, as it is imperative that you return to your primary care physician (or establish a relationship with a primary care physician if you do not have one) for your aftercare needs so that they can reassess your need for medications and monitor your lab values. If you do not have a primary care physician, you can call [phone] for a physician referral.   Increase activity slowly   Complete by:  As  directed         Allergies as of 01/01/2018   No Known Allergies     Medication List    STOP taking these medications   mupirocin ointment 2 % Commonly known as:  BACTROBAN   oxyCODONE-acetaminophen 5-325 MG tablet Commonly known as:  PERCOCET/ROXICET     TAKE these medications   aspirin 81 MG EC tablet Take 1 tablet (81 mg total) by  mouth daily.   aspirin-acetaminophen-caffeine 250-250-65 MG tablet Commonly known as:  EXCEDRIN MIGRAINE Take 2 tablets by mouth every 8 (eight) hours as needed for headache.   atorvastatin 80 MG tablet Commonly known as:  LIPITOR Take 80 mg by mouth at bedtime.   cephALEXin 500 MG capsule Commonly known as:  KEFLEX Take 1 capsule (500 mg total) by mouth every 8 (eight) hours for 7 days.   docusate sodium 100 MG capsule Commonly known as:  COLACE Take 100 mg by mouth daily as needed for mild constipation.   feeding supplement (GLUCERNA SHAKE) Liqd Take 237 mLs by mouth 2 (two) times daily between meals.   furosemide 40 MG tablet Commonly known as:  LASIX Take 1 tablet (40 mg total) by mouth daily.   gabapentin 100 MG capsule Commonly known as:  NEURONTIN Take 1 capsule (100 mg total) by mouth 2 (two) times daily.   isosorbide mononitrate 30 MG 24 hr tablet Commonly known as:  IMDUR Take 2 tablets (60 mg total) daily by mouth.   lisinopril 2.5 MG tablet Commonly known as:  PRINIVIL,ZESTRIL Take 1 tablet (2.5 mg total) daily by mouth.   megestrol 20 MG tablet Commonly known as:  MEGACE Take 20 mg by mouth daily.   metFORMIN 500 MG tablet Commonly known as:  GLUCOPHAGE Take 1,000 mg by mouth 2 (two) times daily with a meal.   metoprolol tartrate 50 MG tablet Commonly known as:  LOPRESSOR Take 0.5 tablets (25 mg total) by mouth 2 (two) times daily.   mirtazapine 15 MG tablet Commonly known as:  REMERON Take 15 mg by mouth at bedtime.   multivitamin with minerals Tabs tablet Take 1 tablet by mouth daily.   nicotine  14 mg/24hr patch Commonly known as:  NICODERM CQ - dosed in mg/24 hours Place 1 patch (14 mg total) onto the skin daily.   nitroGLYCERIN 0.4 MG SL tablet Commonly known as:  NITROSTAT Place 1 tablet (0.4 mg total) under the tongue every 5 (five) minutes x 3 doses as needed for chest pain.   oxyCODONE 5 MG immediate release tablet Commonly known as:  Oxy IR/ROXICODONE Take 1 tablet (5 mg total) by mouth every 6 (six) hours as needed for moderate pain.   sertraline 100 MG tablet Commonly known as:  ZOLOFT Take 200 mg by mouth daily. DEPRESSION/ANXIETY        Follow-up Information    [facility].   Contact information: [address] [facility] [place] [address] [phone]        Nadara Mustard, MD Follow up in 1 week(s).   Specialty:  Orthopedic Surgery Contact information: [address] [place] [address] [phone]           TOTAL DISCHARGE TIME: 35 mins  Osvaldo Shipper  [facility] Pager [phone]  01/01/2018, 12:13 PM

## 2018-01-01 NOTE — Clinical Social Work Placement (Signed)
   CLINICAL SOCIAL WORK PLACEMENT  NOTE 01/01/18 - DISCHARGED TO [facility] VIA AMBULANCE.  Date:  01/01/2018  Patient Details  Name: Jordan Gardner MRN: [id number] Date of Birth: 17-Jul-1951  Clinical Social Work is seeking post-discharge placement for this patient at the Skilled  Nursing Facility level of care (*CSW will initial, date and re-position this form in  chart as items are completed):  No(Discussed facility choices with patient and his sister Georgana Curio at the bedside)   Patient/family provided with [facility] Clinical Social Work Department's list of facilities offering this level of care within the geographic area requested by the patient (or if unable, by the patient's family).  Yes   Patient/family informed of their freedom to choose among providers that offer the needed level of care, that participate in Medicare, Medicaid or managed care program needed by the patient, have an available bed and are willing to accept the patient.  No   Patient/family informed of 's ownership interest in [facility] and [facility], as well as of the fact that they are under no obligation to receive care at these facilities.  PASRR submitted to EDS on 12/28/17     PASRR number received on 01/01/18([id number] E, eff. 5/17 - 01/31/18)     Existing PASRR number confirmed on       FL2 transmitted to all facilities in geographic area requested by pt/family on 01/01/18     FL2 transmitted to all facilities within larger geographic area on       Patient informed that his/her managed care company has contracts with or will negotiate with certain facilities, including the following:        Yes   Patient/family informed of bed offers received.  Patient chooses bed at [facility]     Physician recommends and patient chooses bed at      Patient to be transferred to [facility] on 01/01/18.  Patient to be transferred to facility  by Ambulance     Patient family notified on 01/01/18 of transfer.  Name of family member notified:  Georgana Curio, sister at the bedside     PHYSICIAN       Additional Comment:    _______________________________________________ Cristobal Goldmann, LCSW 01/01/2018, 5:03 PM

## 2018-01-01 NOTE — Discharge Instructions (Signed)
Gangrene °Gangrene is a medical condition caused by a lack of blood supply to an area of your body. Oxygen travels through your blood, so less blood means less oxygen. Without oxygen, your body tissue will start to die. °Gangrene can affect many parts of your body. Gangrene is most common on the skin (external gangrene), but it can also affect internal parts of the body (internal gangrene). °Gangrene requires emergency treatment. °What are the causes? °Any condition that cuts off blood supply can cause gangrene. Common causes include: °· Injuries. °· Blood vessel disease. °· Diabetes. °· Infections. ° °What increases the risk? °You may be at increased risk for gangrene if you have: °· A serious injury. °· Recent surgery. °· Diabetes. °· A weak body defense system (immune system). °· Narrowing of your arteries (arteriosclerosis). °· An immune system disease that causes your arteries to constrict (Raynaud disease). °· A history of IV drug use. ° °What are the signs or symptoms? °The signs and symptoms depend on what part of your body is affected. If you have external gangrene, you may have: °· Severe pain followed by loss of feeling. °· Redness and swelling. °· Crackling sounds when you press on a swollen area of skin. °· A wound with bad-smelling drainage. °· Changes in the color of your skin. It may turn red, blue, black, or white. °· Fever and chills. ° °If you have internal gangrene, you may have: °· Fever and chills. °· Confusion. °· Dizziness. °· Severe pain. °· Rapid heartbeat and breathing. °· Loss of appetite. °· Nausea or vomiting. ° °How is this diagnosed? °To diagnose gangrene, your health care provider will take your medical history and do a physical exam. Tests may also be done to help in making the diagnosis. These may include: °· X-rays of your blood vessels after injection of a dye (arteriogram). °· Blood tests to look for an infection in your blood. °· Imaging studies such as a CT scan or  MRI. °· Taking a swab from a draining wound to look for bacteria in the laboratory (culture). °· Taking a piece of tissue (biopsy) to look for cell death in the laboratory. °· A surgical procedure to look for gangrene inside your body. ° °How is this treated? °Treatment for gangrene depends on the cause and the area of your body that is affected. Gangrene is usually treated in the hospital. It is very important to start treatment early before gangrene spreads. Treatment may include the following: °· Medicine. You may get high doses of antibiotic medicine for gangrene caused by infection. The antibiotics may be given directly into a vein through an IV tube. °· Surgery. Surgical options may include: °? Debridement. This is surgery to remove dead tissue. You may need to have debridement several times. °? Bypass or angioplasty. This surgery improves the blood supply to an affected area. °? Amputation. Sometimes it is necessary to remove a body part. °· Oxygen therapy. This involves treatment in a chamber designed to provide high levels of oxygen (hyperbaric oxygen therapy). It can improve the oxygen supply to an affected area. °· Supportive care. This may include: °? IV fluids and nutrients. °? Pain medicines. °? Blood thinners to prevent blood clots. ° °Follow these instructions at home: °· Take medicines only as directed by your health care provider. °· If you were prescribed an antibiotic medicine, finish it all even if you start to feel better. °· Clean any cuts or scratches with an antiseptic. °· Cover any open wounds. °·   Check wounds for signs of infection. Watch for redness or swelling. °· Do not use any tobacco products, including cigarettes, chewing tobacco, or electronic cigarettes. If you need help quitting, ask your health care provider. °· Limit alcohol intake to no more than 1 drink per day for nonpregnant women and 2 drinks per day for men. One drink equals 12 ounces of beer, 5 ounces of wine, or 1½  ounces of hard liquor. °· Eat a healthy diet and maintain a healthy weight. °· Get some exercise on most days of the week. Ask your health care provider to suggest activities that are safe for you. °· If you have diabetes or another blood vessel disease, check your feet often for signs of injury or infection. °· After any surgery you have, follow your health care provider's instructions carefully. °· Keep all follow-up visits as directed by your health care provider. This is important. °Contact a health care provider if: °· You have a wound or sore that is not healing. °· You have color changes (white, red, blue, or black) in an area of your skin. °· You have a wound or sore with smelly drainage. °Get help right away if: °· You have rapidly worsening pain at the site of a skin infection or wound. °· You lose sensation at the site of a skin infection or wound. °· You have unexplained: °? Fever. °? Chills. °? Confusion. °? Fainting. °This information is not intended to replace advice given to you by your health care provider. Make sure you discuss any questions you have with your health care provider. °Document Released: 06/05/2004 Document Revised: 01/10/2016 Document Reviewed: 09/28/2013 °Elsevier Interactive Patient Education © 2018 [employer] ° °

## 2018-01-01 NOTE — Progress Notes (Addendum)
1930 Report called to Mia at [facility]. Bess Kinds, RN

## 2018-01-06 ENCOUNTER — Ambulatory Visit (INDEPENDENT_AMBULATORY_CARE_PROVIDER_SITE_OTHER): Payer: Medicare HMO | Admitting: Orthopedic Surgery

## 2018-01-06 ENCOUNTER — Encounter (INDEPENDENT_AMBULATORY_CARE_PROVIDER_SITE_OTHER): Payer: Self-pay | Admitting: Orthopedic Surgery

## 2018-01-06 ENCOUNTER — Other Ambulatory Visit (INDEPENDENT_AMBULATORY_CARE_PROVIDER_SITE_OTHER): Payer: Self-pay | Admitting: Orthopedic Surgery

## 2018-01-06 DIAGNOSIS — I96 Gangrene, not elsewhere classified: Secondary | ICD-10-CM | POA: Diagnosis not present

## 2018-01-06 DIAGNOSIS — E43 Unspecified severe protein-calorie malnutrition: Secondary | ICD-10-CM | POA: Diagnosis not present

## 2018-01-06 DIAGNOSIS — L97909 Non-pressure chronic ulcer of unspecified part of unspecified lower leg with unspecified severity: Secondary | ICD-10-CM

## 2018-01-06 DIAGNOSIS — I739 Peripheral vascular disease, unspecified: Secondary | ICD-10-CM | POA: Diagnosis not present

## 2018-01-06 DIAGNOSIS — I70262 Atherosclerosis of native arteries of extremities with gangrene, left leg: Secondary | ICD-10-CM

## 2018-01-06 DIAGNOSIS — I70261 Atherosclerosis of native arteries of extremities with gangrene, right leg: Secondary | ICD-10-CM

## 2018-01-06 NOTE — Progress Notes (Signed)
Office Visit Note   Patient: Jordan Gardner           Date of Birth: 09/09/1950           MRN: [id number] Visit Date: 01/06/2018              Requested by: Jeanine Luz [address] [facility] [place], [place] [address] PCP: [facility]  Chief Complaint  Patient presents with  . Left Foot - Follow-up, Pain  . Right Foot - Follow-up, Pain      HPI: Patient is a 67 year old gentleman with severe peripheral vascular disease he does not have revascularization options and does not have good circulation from the popliteal fossa distally.  Patient presents complaining of increasing burning throbbing aching pain in both feet with black gangrenous changes of all his toes.  Patient states that this is been present for 6 months he was taking Percocet for pain and cannot tolerate the discomfort anymore.  Patient states that he does not have enough strength at this time to ambulate does not have enough strength in his arms to hold himself up.  Assessment & Plan: Visit Diagnoses:  1. 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 Gangrene of foot (HCC)   3. Severe protein-calorie malnutrition (HCC)     Plan: We will plan for an above-the-knee amputation bilaterally.  We will set patient up for surgery at [facility] patient would like to proceed with surgery next week.  He will need to return to skilled nursing after his admission.  Risk and benefits were discussed including risk of the wounds not healing.  Follow-Up Instructions: Return in about 3 weeks (around 01/27/2018).   Ortho Exam  Patient is alert, oriented, no adenopathy, well-dressed, normal affect, normal respiratory effort. Examination placements both legs are cold to the touch he has black gangrenous changes to the toes with ischemic changes of the entire foot there is no cellulitis no odor no drainage no signs of infection.  Imaging: No results found. No images are attached  to the encounter.  Labs: Lab Results  Component Value Date   HGBA1C 7.1 (H) 07/28/2017   HGBA1C 7.7 (H) 06/26/2017   HGBA1C 7.4 (H) 04/25/2017   REPTSTATUS 12/30/2017 FINAL 12/25/2017   REPTSTATUS 12/30/2017 FINAL 12/25/2017   GRAMSTAIN  07/28/2017    ABUNDANT WBC PRESENT,BOTH PMN AND MONONUCLEAR NO ORGANISMS SEEN    CULT  12/25/2017    NO GROWTH 5 DAYS Performed at [facility], [address] [place], [place] [address]    CULT  12/25/2017    NO GROWTH 5 DAYS Performed at [facility], [address], [place], [place] [address]      Lab Results  Component Value Date   ALBUMIN 2.4 (L) 12/25/2017   ALBUMIN 3.3 (L) 07/28/2017   ALBUMIN 2.9 (L) 07/23/2017    There is no height or weight on file to calculate BMI.  Orders:  No orders of the defined types were placed in this encounter.  No orders of the defined types were placed in this encounter.    Procedures: No procedures performed  Clinical Data: No additional findings.  ROS:  All other systems negative, except as noted in the HPI. Review of Systems  Objective: Vital Signs: There were no vitals taken for this visit.  Specialty Comments:  No specialty comments available.  PMFS History: Patient Active Problem List   Diagnosis Date Noted  . Leukocytosis 12/26/2017  . Essential  hypertension 12/26/2017  . Gangrene of foot (HCC) 10/29/2017  . Acute hypoxemic respiratory failure (HCC) 07/28/2017  . 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 Critical lower limb ischemia   . 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 AKI (acute kidney injury) (HCC) 04/28/2017  . Malnutrition of moderate degree 04/28/2017  . Pressure injury of skin 04/27/2017  . Severe protein-calorie malnutrition (HCC) 04/27/2017  . Physical deconditioning 04/27/2017  .  Ulcer of great toe (HCC) 04/27/2017  . Claudication (HCC) 04/27/2017  . Sacral decubitus ulcer 04/27/2017  . Accelerated hypertension   . Hyperlipidemia   . Tobacco abuse disorder   . Altered mental status   . PAD (peripheral artery disease) (HCC)   . NSTEMI (non-ST elevated myocardial infarction) (HCC) 04/25/2017  . NECK PAIN 10/16/2008  . BACK PAIN 10/16/2008   Past Medical History:  Diagnosis Date  . Anxiety   . Arthritis   . Asthma   . Coronary artery disease   . Depression   . Diabetes mellitus without complication (HCC)    type 2  . Headache   . Hyperlipidemia   . Hypertension   . Myocardial infarction (HCC)   . Neuromuscular disorder (HCC)   . PAD (peripheral artery disease) (HCC)   . Protein calorie malnutrition (HCC) 04/2017  . Psychotic affective disorder (HCC)    Per sister. Unsure of Dx. Pt sleeps late and difficult to wake. odd affect  . Sacral decubitus ulcer 04/2017  . Stroke [facility])    left side weakness    Family History  Problem Relation Age of Onset  . Hypertension Sister     Past Surgical History:  Procedure Laterality Date  . ABDOMINAL AORTOGRAM W/LOWER EXTREMITY N/A 06/08/2017   Procedure: ABDOMINAL AORTOGRAM W/LOWER EXTREMITY;  Surgeon: Chuck Hint, MD;  Location: [facility] INVASIVE CV LAB;  Service: Cardiovascular;  Laterality: N/A;  . BACK SURGERY    . CARDIAC CATHETERIZATION    . CARDIAC SURGERY     CABG x5 2012 - [facility]  . CAROTID-SUBCLAVIAN BYPASS GRAFT Left 07/23/2017   Procedure: BYPASS SUBCLAVIAN ARTERY  USING VIABAHN GORE STENT GRAFT;  Surgeon: Chuck Hint, MD;  Location: [facility] OR;  Service: Vascular;  Laterality: Left;  . LEFT HEART CATH AND CORS/GRAFTS ANGIOGRAPHY N/A 06/26/2017   Procedure: LEFT HEART CATH AND CORS/GRAFTS ANGIOGRAPHY;  Surgeon: Tonny Bollman, MD;  Location: [facility] INVASIVE CV LAB;  Service: Cardiovascular;  Laterality: N/A;   Social History   Occupational History  . Not on file  Tobacco Use  . Smoking  status: Current Every Day Smoker    Packs/day: 1.00    Types: Cigarettes  . Smokeless tobacco: Never Used  Substance and Sexual Activity  . Alcohol use: No  . Drug use: No  . Sexual activity: Not on file

## 2018-01-12 ENCOUNTER — Encounter (HOSPITAL_COMMUNITY): Payer: Self-pay | Admitting: *Deleted

## 2018-01-12 MED ORDER — CEFAZOLIN SODIUM-DEXTROSE 2-4 GM/100ML-% IV SOLN
2.0000 g | INTRAVENOUS | Status: AC
  Administered 2018-01-13: 2 g via INTRAVENOUS
  Filled 2018-01-12: qty 100

## 2018-01-12 NOTE — Progress Notes (Signed)
Spoke with Mia, LPN at [facility] where pt is residing. She states pt is alert, oriented and can speak for himself. Pt has hx of CAD with CABG in 2012. Mia states pt has not complained of any recent chest pain or sob. Pt is a type 2 diabetic. Mia states his fasting blood sugar has been between 109-150 (one morning it was 220). She states that they don't have a recent A1C on pt. I faxed pre-op instructions to Mia at [phone].

## 2018-01-12 NOTE — Pre-Procedure Instructions (Signed)
   Jordan Gardner  01/12/2018    Mr. Blatz procedure is scheduled on Wednesday, 01/13/18 at 10:55 AM.   Report to [facility] Entrance "A" Admitting Office at 8:30 AM.   Call this number if you have problems the morning of surgery: [phone]     Remember:  Pt is not to eat food or drink liquids after midnight, tonight.  Take these medicines the morning of surgery with A SIP OF WATER: Gabapentin (Neurontin), Isosorbide Mononitrate (Imdur), Metoprolol (Lopressor), Sertraline (Zoloft), Oxycodone - if needed, Nitroglycerin - if needed.  Please check patient's blood sugar when he gets up in the AM and every 2 hours until he leaves for the hospital. If blood sugar is 70 or below, treat with 1/2 cup of clear juice (apple or cranberry) and recheck blood sugar 15 minutes after drinking juice. If blood sugar continues to be 70 or below, call the Short Stay department and ask to speak to a nurse.  Per order of Dr. Gaynelle Adu, Anesthesiologist/Arend Bahl Michail Jewels, RN    Do not wear jewelry.  Do not wear lotions, powders, cologne or deodorant.  Men may shave face and neck.  Do not bring valuables to the hospital.  [facility] is not responsible for any belongings or valuables.  Contacts, dentures or bridgework may not be worn into surgery.  Leave your suitcase in the car.  After surgery it may be brought to your room.  For patients admitted to the hospital, discharge time will be determined by your treatment team.  If any questions today, call me, Hyman Bible, RN at [phone].

## 2018-01-13 ENCOUNTER — Encounter (HOSPITAL_COMMUNITY)
Admission: RE | Disposition: A | Discharge status: Skilled Nursing Facility | Payer: Self-pay | Source: Ambulatory Visit | Attending: Orthopedic Surgery

## 2018-01-13 ENCOUNTER — Ambulatory Visit: Payer: Medicare HMO | Admitting: Vascular Surgery

## 2018-01-13 ENCOUNTER — Other Ambulatory Visit: Payer: Self-pay

## 2018-01-13 ENCOUNTER — Ambulatory Visit (INDEPENDENT_AMBULATORY_CARE_PROVIDER_SITE_OTHER): Payer: Medicare HMO | Admitting: Family

## 2018-01-13 ENCOUNTER — Inpatient Hospital Stay (HOSPITAL_COMMUNITY): Payer: Medicare HMO | Admitting: Certified Registered"

## 2018-01-13 ENCOUNTER — Encounter (HOSPITAL_COMMUNITY): Payer: Self-pay | Admitting: *Deleted

## 2018-01-13 ENCOUNTER — Encounter (HOSPITAL_COMMUNITY): Payer: Self-pay

## 2018-01-13 ENCOUNTER — Inpatient Hospital Stay (HOSPITAL_COMMUNITY)
Admission: RE | Admit: 2018-01-13 | Discharge: 2018-01-18 | DRG: 241 | Disposition: A | Discharge status: Skilled Nursing Facility | Payer: Medicare HMO | Source: Ambulatory Visit | Attending: Orthopedic Surgery | Admitting: Orthopedic Surgery

## 2018-01-13 DIAGNOSIS — I998 Other disorder of circulatory system: Secondary | ICD-10-CM | POA: Diagnosis present

## 2018-01-13 DIAGNOSIS — I70263 Atherosclerosis of native arteries of extremities with gangrene, bilateral legs: Secondary | ICD-10-CM | POA: Diagnosis not present

## 2018-01-13 DIAGNOSIS — I252 Old myocardial infarction: Secondary | ICD-10-CM

## 2018-01-13 DIAGNOSIS — E1152 Type 2 diabetes mellitus with diabetic peripheral angiopathy with gangrene: Secondary | ICD-10-CM | POA: Diagnosis present

## 2018-01-13 DIAGNOSIS — J45909 Unspecified asthma, uncomplicated: Secondary | ICD-10-CM | POA: Diagnosis present

## 2018-01-13 DIAGNOSIS — I70262 Atherosclerosis of native arteries of extremities with gangrene, left leg: Secondary | ICD-10-CM

## 2018-01-13 DIAGNOSIS — Z951 Presence of aortocoronary bypass graft: Secondary | ICD-10-CM

## 2018-01-13 DIAGNOSIS — I2510 Atherosclerotic heart disease of native coronary artery without angina pectoris: Secondary | ICD-10-CM | POA: Diagnosis present

## 2018-01-13 DIAGNOSIS — Z87891 Personal history of nicotine dependence: Secondary | ICD-10-CM

## 2018-01-13 DIAGNOSIS — E785 Hyperlipidemia, unspecified: Secondary | ICD-10-CM | POA: Diagnosis present

## 2018-01-13 DIAGNOSIS — Z8673 Personal history of transient ischemic attack (TIA), and cerebral infarction without residual deficits: Secondary | ICD-10-CM | POA: Diagnosis not present

## 2018-01-13 DIAGNOSIS — I10 Essential (primary) hypertension: Secondary | ICD-10-CM | POA: Diagnosis present

## 2018-01-13 DIAGNOSIS — S78112A Complete traumatic amputation at level between left hip and knee, initial encounter: Secondary | ICD-10-CM

## 2018-01-13 DIAGNOSIS — Z7984 Long term (current) use of oral hypoglycemic drugs: Secondary | ICD-10-CM | POA: Diagnosis not present

## 2018-01-13 DIAGNOSIS — I70261 Atherosclerosis of native arteries of extremities with gangrene, right leg: Secondary | ICD-10-CM

## 2018-01-13 HISTORY — PX: ABOVE KNEE LEG AMPUTATION: SUR20

## 2018-01-13 HISTORY — PX: AMPUTATION: SHX166

## 2018-01-13 LAB — GLUCOSE, CAPILLARY
GLUCOSE-CAPILLARY: 67 mg/dL (ref 65–99)
Glucose-Capillary: 79 mg/dL (ref 65–99)
Glucose-Capillary: 116 mg/dL — ABNORMAL HIGH (ref 65–99)
Glucose-Capillary: 147 mg/dL — ABNORMAL HIGH (ref 65–99)

## 2018-01-13 SURGERY — AMPUTATION, ABOVE KNEE
Anesthesia: General | Site: Knee | Laterality: Bilateral

## 2018-01-13 MED ORDER — SERTRALINE HCL 100 MG PO TABS
200.0000 mg | ORAL_TABLET | Freq: Every day | ORAL | Status: DC
  Administered 2018-01-14 – 2018-01-18 (×5): 200 mg via ORAL
  Filled 2018-01-13 (×5): qty 2

## 2018-01-13 MED ORDER — OXYCODONE HCL 5 MG PO TABS
10.0000 mg | ORAL_TABLET | ORAL | Status: DC | PRN
  Administered 2018-01-13: 15 mg via ORAL
  Administered 2018-01-13: 10 mg via ORAL
  Administered 2018-01-14 – 2018-01-15 (×8): 15 mg via ORAL
  Administered 2018-01-17 – 2018-01-18 (×3): 10 mg via ORAL
  Administered 2018-01-18: 15 mg via ORAL
  Filled 2018-01-13: qty 2
  Filled 2018-01-13 (×11): qty 3
  Filled 2018-01-13: qty 2

## 2018-01-13 MED ORDER — DIPHENHYDRAMINE HCL 50 MG/ML IJ SOLN
INTRAMUSCULAR | Status: AC
  Filled 2018-01-13: qty 1

## 2018-01-13 MED ORDER — MAGNESIUM CITRATE PO SOLN
1.0000 | Freq: Once | ORAL | Status: DC | PRN
Start: 2018-01-13 — End: 2018-01-19

## 2018-01-13 MED ORDER — ONDANSETRON HCL 4 MG/2ML IJ SOLN: 4.0000 mg | Freq: Once | INTRAMUSCULAR | Status: DC | PRN

## 2018-01-13 MED ORDER — OXYCODONE HCL 5 MG PO TABS
5.0000 mg | ORAL_TABLET | ORAL | Status: DC | PRN
  Administered 2018-01-13 – 2018-01-18 (×4): 10 mg via ORAL
  Filled 2018-01-13 (×5): qty 2

## 2018-01-13 MED ORDER — OXYCODONE HCL 5 MG PO TABS
ORAL_TABLET | ORAL | Status: AC
  Administered 2018-01-13: 10 mg via ORAL
  Filled 2018-01-13: qty 2

## 2018-01-13 MED ORDER — ROCURONIUM BROMIDE 10 MG/ML (PF) SYRINGE
PREFILLED_SYRINGE | INTRAVENOUS | Status: AC
  Filled 2018-01-13: qty 5

## 2018-01-13 MED ORDER — ISOSORBIDE MONONITRATE ER 60 MG PO TB24
60.0000 mg | ORAL_TABLET | Freq: Every day | ORAL | Status: DC
  Administered 2018-01-14 – 2018-01-18 (×5): 60 mg via ORAL
  Filled 2018-01-13 (×5): qty 1

## 2018-01-13 MED ORDER — DEXTROSE 5 % IV SOLN
INTRAVENOUS | Status: DC | PRN
  Administered 2018-01-13: 25 ug/min via INTRAVENOUS

## 2018-01-13 MED ORDER — SODIUM CHLORIDE 0.9 % IV SOLN
INTRAVENOUS | Status: DC
  Administered 2018-01-13: 16:00:00 via INTRAVENOUS

## 2018-01-13 MED ORDER — HYDROMORPHONE HCL 2 MG/ML IJ SOLN
INTRAMUSCULAR | Status: AC
  Administered 2018-01-13: 0.5 mg via INTRAVENOUS
  Filled 2018-01-13: qty 1

## 2018-01-13 MED ORDER — DIPHENHYDRAMINE HCL 50 MG/ML IJ SOLN
INTRAMUSCULAR | Status: DC | PRN
  Administered 2018-01-13: 12.5 mg via INTRAVENOUS

## 2018-01-13 MED ORDER — MIRTAZAPINE 15 MG PO TABS
15.0000 mg | ORAL_TABLET | Freq: Every day | ORAL | Status: DC
  Administered 2018-01-13 – 2018-01-17 (×5): 15 mg via ORAL
  Filled 2018-01-13 (×5): qty 1

## 2018-01-13 MED ORDER — ASPIRIN EC 325 MG PO TBEC
325.0000 mg | DELAYED_RELEASE_TABLET | Freq: Every day | ORAL | Status: DC
  Administered 2018-01-14 – 2018-01-18 (×5): 325 mg via ORAL
  Filled 2018-01-13 (×5): qty 1

## 2018-01-13 MED ORDER — PROPOFOL 10 MG/ML IV BOLUS
INTRAVENOUS | Status: DC | PRN
  Administered 2018-01-13: 160 mg via INTRAVENOUS

## 2018-01-13 MED ORDER — BISACODYL 10 MG RE SUPP: 10.0000 mg | Freq: Every day | RECTAL | Status: DC | PRN

## 2018-01-13 MED ORDER — ISOSORBIDE MONONITRATE ER 60 MG PO TB24: 60.0000 mg | ORAL_TABLET | Freq: Every day | ORAL | Status: DC

## 2018-01-13 MED ORDER — GABAPENTIN 100 MG PO CAPS
100.0000 mg | ORAL_CAPSULE | Freq: Two times a day (BID) | ORAL | Status: DC
  Administered 2018-01-13 – 2018-01-18 (×10): 100 mg via ORAL
  Filled 2018-01-13 (×10): qty 1

## 2018-01-13 MED ORDER — METHOCARBAMOL 500 MG PO TABS
ORAL_TABLET | ORAL | Status: AC
Start: 2018-01-13 — End: 2018-01-13
  Administered 2018-01-13: 500 mg
  Filled 2018-01-13: qty 1

## 2018-01-13 MED ORDER — DEXAMETHASONE SODIUM PHOSPHATE 10 MG/ML IJ SOLN
INTRAMUSCULAR | Status: DC | PRN
  Administered 2018-01-13: 5 mg via INTRAVENOUS

## 2018-01-13 MED ORDER — LISINOPRIL 2.5 MG PO TABS
2.5000 mg | ORAL_TABLET | Freq: Every day | ORAL | Status: DC
  Administered 2018-01-14 – 2018-01-18 (×5): 2.5 mg via ORAL
  Filled 2018-01-13 (×5): qty 1

## 2018-01-13 MED ORDER — CHLORHEXIDINE GLUCONATE 4 % EX LIQD: 60.0000 mL | Freq: Once | CUTANEOUS | Status: DC

## 2018-01-13 MED ORDER — DOCUSATE SODIUM 100 MG PO CAPS
100.0000 mg | ORAL_CAPSULE | Freq: Two times a day (BID) | ORAL | Status: DC
  Administered 2018-01-13 – 2018-01-18 (×8): 100 mg via ORAL
  Filled 2018-01-13 (×9): qty 1

## 2018-01-13 MED ORDER — METFORMIN HCL 500 MG PO TABS
1000.0000 mg | ORAL_TABLET | Freq: Two times a day (BID) | ORAL | Status: DC
  Administered 2018-01-13 – 2018-01-18 (×11): 1000 mg via ORAL
  Filled 2018-01-13 (×11): qty 2

## 2018-01-13 MED ORDER — NICOTINE 14 MG/24HR TD PT24
14.0000 mg | MEDICATED_PATCH | Freq: Every day | TRANSDERMAL | Status: DC
  Administered 2018-01-14 – 2018-01-18 (×5): 14 mg via TRANSDERMAL
  Filled 2018-01-13 (×5): qty 1

## 2018-01-13 MED ORDER — FENTANYL CITRATE (PF) 250 MCG/5ML IJ SOLN
INTRAMUSCULAR | Status: DC | PRN
  Administered 2018-01-13 (×2): 25 ug via INTRAVENOUS

## 2018-01-13 MED ORDER — METOCLOPRAMIDE HCL 5 MG/ML IJ SOLN: 5.0000 mg | Freq: Three times a day (TID) | INTRAMUSCULAR | Status: DC | PRN

## 2018-01-13 MED ORDER — CEFAZOLIN SODIUM-DEXTROSE 1-4 GM/50ML-% IV SOLN
1.0000 g | Freq: Four times a day (QID) | INTRAVENOUS | Status: AC
  Administered 2018-01-13 – 2018-01-14 (×3): 1 g via INTRAVENOUS
  Filled 2018-01-13 (×3): qty 50

## 2018-01-13 MED ORDER — METOPROLOL TARTRATE 25 MG PO TABS
25.0000 mg | ORAL_TABLET | Freq: Two times a day (BID) | ORAL | Status: DC
  Administered 2018-01-13 – 2018-01-18 (×7): 25 mg via ORAL
  Filled 2018-01-13 (×11): qty 1

## 2018-01-13 MED ORDER — GLUCERNA SHAKE PO LIQD
237.0000 mL | Freq: Two times a day (BID) | ORAL | Status: DC
  Administered 2018-01-13 – 2018-01-18 (×8): 237 mL via ORAL

## 2018-01-13 MED ORDER — ATORVASTATIN CALCIUM 80 MG PO TABS
80.0000 mg | ORAL_TABLET | Freq: Every day | ORAL | Status: DC
  Administered 2018-01-13 – 2018-01-17 (×5): 80 mg via ORAL
  Filled 2018-01-13 (×5): qty 1

## 2018-01-13 MED ORDER — LIDOCAINE 2% (20 MG/ML) 5 ML SYRINGE
INTRAMUSCULAR | Status: AC
  Filled 2018-01-13: qty 5

## 2018-01-13 MED ORDER — FENTANYL CITRATE (PF) 250 MCG/5ML IJ SOLN
INTRAMUSCULAR | Status: AC
  Filled 2018-01-13: qty 5

## 2018-01-13 MED ORDER — PHENYLEPHRINE 40 MCG/ML (10ML) SYRINGE FOR IV PUSH (FOR BLOOD PRESSURE SUPPORT)
PREFILLED_SYRINGE | INTRAVENOUS | Status: AC
  Filled 2018-01-13: qty 10

## 2018-01-13 MED ORDER — LIDOCAINE 2% (20 MG/ML) 5 ML SYRINGE
INTRAMUSCULAR | Status: DC | PRN
  Administered 2018-01-13: 60 mg via INTRAVENOUS

## 2018-01-13 MED ORDER — HYDROMORPHONE HCL 2 MG/ML IJ SOLN
0.2500 mg | INTRAMUSCULAR | Status: DC | PRN
  Administered 2018-01-13: 0.5 mg via INTRAVENOUS

## 2018-01-13 MED ORDER — 0.9 % SODIUM CHLORIDE (POUR BTL) OPTIME
TOPICAL | Status: DC | PRN
  Administered 2018-01-13: 1000 mL

## 2018-01-13 MED ORDER — ONDANSETRON HCL 4 MG/2ML IJ SOLN
INTRAMUSCULAR | Status: DC | PRN
  Administered 2018-01-13: 4 mg via INTRAVENOUS

## 2018-01-13 MED ORDER — LACTATED RINGERS IV SOLN
INTRAVENOUS | Status: DC
  Administered 2018-01-13: 10:00:00 via INTRAVENOUS

## 2018-01-13 MED ORDER — ONDANSETRON HCL 4 MG PO TABS: 4.0000 mg | ORAL_TABLET | Freq: Four times a day (QID) | ORAL | Status: DC | PRN

## 2018-01-13 MED ORDER — MEGESTROL ACETATE 20 MG PO TABS
20.0000 mg | ORAL_TABLET | Freq: Every day | ORAL | Status: DC
  Administered 2018-01-14 – 2018-01-18 (×5): 20 mg via ORAL
  Filled 2018-01-13 (×5): qty 1

## 2018-01-13 MED ORDER — DEXAMETHASONE SODIUM PHOSPHATE 10 MG/ML IJ SOLN
INTRAMUSCULAR | Status: AC
  Filled 2018-01-13: qty 1

## 2018-01-13 MED ORDER — FUROSEMIDE 40 MG PO TABS
40.0000 mg | ORAL_TABLET | Freq: Every day | ORAL | Status: DC
  Administered 2018-01-14 – 2018-01-18 (×5): 40 mg via ORAL
  Filled 2018-01-13 (×5): qty 1

## 2018-01-13 MED ORDER — METOCLOPRAMIDE HCL 5 MG PO TABS: 5.0000 mg | ORAL_TABLET | Freq: Three times a day (TID) | ORAL | Status: DC | PRN

## 2018-01-13 MED ORDER — ONDANSETRON HCL 4 MG/2ML IJ SOLN
INTRAMUSCULAR | Status: AC
  Filled 2018-01-13: qty 2

## 2018-01-13 MED ORDER — ACETAMINOPHEN 325 MG PO TABS
325.0000 mg | ORAL_TABLET | Freq: Four times a day (QID) | ORAL | Status: DC | PRN
  Administered 2018-01-14 – 2018-01-18 (×4): 650 mg via ORAL
  Filled 2018-01-13 (×5): qty 2

## 2018-01-13 MED ORDER — MIDAZOLAM HCL 2 MG/2ML IJ SOLN
INTRAMUSCULAR | Status: AC
  Filled 2018-01-13: qty 2

## 2018-01-13 MED ORDER — PHENYLEPHRINE 40 MCG/ML (10ML) SYRINGE FOR IV PUSH (FOR BLOOD PRESSURE SUPPORT)
PREFILLED_SYRINGE | INTRAVENOUS | Status: DC | PRN
  Administered 2018-01-13: 120 ug via INTRAVENOUS
  Administered 2018-01-13: 80 ug via INTRAVENOUS

## 2018-01-13 MED ORDER — METHOCARBAMOL 1000 MG/10ML IJ SOLN
500.0000 mg | Freq: Four times a day (QID) | INTRAVENOUS | Status: DC | PRN
  Filled 2018-01-13: qty 5

## 2018-01-13 MED ORDER — METHOCARBAMOL 500 MG PO TABS
500.0000 mg | ORAL_TABLET | Freq: Four times a day (QID) | ORAL | Status: DC | PRN
  Administered 2018-01-13 – 2018-01-18 (×9): 500 mg via ORAL
  Filled 2018-01-13 (×9): qty 1

## 2018-01-13 MED ORDER — HYDROMORPHONE HCL 2 MG/ML IJ SOLN
0.5000 mg | INTRAMUSCULAR | Status: DC | PRN
  Administered 2018-01-13: 1 mg via INTRAVENOUS
  Administered 2018-01-13: 0.5 mg via INTRAVENOUS
  Administered 2018-01-13 – 2018-01-18 (×9): 1 mg via INTRAVENOUS
  Filled 2018-01-13 (×10): qty 1

## 2018-01-13 MED ORDER — PROPOFOL 10 MG/ML IV BOLUS
INTRAVENOUS | Status: AC
  Filled 2018-01-13: qty 20

## 2018-01-13 MED ORDER — ONDANSETRON HCL 4 MG/2ML IJ SOLN
4.0000 mg | Freq: Four times a day (QID) | INTRAMUSCULAR | Status: DC | PRN
Start: 2018-01-13 — End: 2018-01-19

## 2018-01-13 MED ORDER — POLYETHYLENE GLYCOL 3350 17 G PO PACK: 17.0000 g | PACK | Freq: Every day | ORAL | Status: DC | PRN

## 2018-01-13 MED ORDER — NITROGLYCERIN 0.4 MG SL SUBL: 0.4000 mg | SUBLINGUAL_TABLET | SUBLINGUAL | Status: DC | PRN

## 2018-01-13 MED ORDER — DEXTROSE 50 % IV SOLN
25.0000 mL | Freq: Once | INTRAVENOUS | Status: AC
  Administered 2018-01-13: 25 mL via INTRAVENOUS
  Filled 2018-01-13: qty 50

## 2018-01-13 SURGICAL SUPPLY — 33 items
BLADE SAW RECIP 87.9 MT (BLADE) ×3 IMPLANT
BNDG COHESIVE 6X5 TAN STRL LF (GAUZE/BANDAGES/DRESSINGS) ×3 IMPLANT
CANISTER WOUND CARE 500ML ATS (WOUND CARE) IMPLANT
COVER SURGICAL LIGHT HANDLE (MISCELLANEOUS) ×3 IMPLANT
CUFF TOURNIQUET SINGLE 34IN LL (TOURNIQUET CUFF) IMPLANT
DRAPE INCISE IOBAN 66X45 STRL (DRAPES) ×6 IMPLANT
DRAPE U-SHAPE 47X51 STRL (DRAPES) ×3 IMPLANT
DRESSING PREVENA PLUS CUSTOM (GAUZE/BANDAGES/DRESSINGS) ×1 IMPLANT
DRSG PREVENA PLUS CUSTOM (GAUZE/BANDAGES/DRESSINGS) ×6
DURAPREP 26ML APPLICATOR (WOUND CARE) ×3 IMPLANT
ELECT CAUTERY BLADE 6.4 (BLADE) ×2 IMPLANT
ELECT REM PT RETURN 9FT ADLT (ELECTROSURGICAL) ×3
ELECTRODE REM PT RTRN 9FT ADLT (ELECTROSURGICAL) ×1 IMPLANT
GLOVE BIOGEL PI IND STRL 9 (GLOVE) ×1 IMPLANT
GLOVE BIOGEL PI INDICATOR 9 (GLOVE) ×2
GLOVE SURG ORTHO 9.0 STRL STRW (GLOVE) ×3 IMPLANT
GOWN STRL REUS W/ TWL XL LVL3 (GOWN DISPOSABLE) ×2 IMPLANT
GOWN STRL REUS W/TWL XL LVL3 (GOWN DISPOSABLE) ×6
KIT BASIN OR (CUSTOM PROCEDURE TRAY) ×3 IMPLANT
KIT TURNOVER KIT B (KITS) ×3 IMPLANT
MANIFOLD NEPTUNE II (INSTRUMENTS) ×3 IMPLANT
NS IRRIG 1000ML POUR BTL (IV SOLUTION) ×3 IMPLANT
PACK ORTHO EXTREMITY (CUSTOM PROCEDURE TRAY) ×3 IMPLANT
PAD ARMBOARD 7.5X6 YLW CONV (MISCELLANEOUS) ×3 IMPLANT
STAPLER VISISTAT 35W (STAPLE) IMPLANT
STOCKINETTE IMPERVIOUS LG (DRAPES) IMPLANT
SUT ETHILON 2 0 PSLX (SUTURE) ×14 IMPLANT
SUT SILK 2 0 (SUTURE) ×3
SUT SILK 2-0 18XBRD TIE 12 (SUTURE) ×1 IMPLANT
TOWEL GREEN STERILE FF (TOWEL DISPOSABLE) ×3 IMPLANT
TUBE CONNECTING 20'X1/4 (TUBING) ×1
TUBE CONNECTING 20X1/4 (TUBING) ×2 IMPLANT
YANKAUER SUCT BULB TIP NO VENT (SUCTIONS) ×3 IMPLANT

## 2018-01-13 NOTE — Op Note (Signed)
01/13/2018  12:11 PM  PATIENT:  Jordan Gardner    PRE-OPERATIVE DIAGNOSIS:  Gangrene Bilateral Legs  POST-OPERATIVE DIAGNOSIS:  Same  PROCEDURE:  BILATERAL ABOVE KNEE AMPUTATION Bilateral Praveena incisional wound vacs.   SURGEON:  Nadara Mustard, MD  PHYSICIAN ASSISTANT:None ANESTHESIA:   General  PREOPERATIVE INDICATIONS:  Jordan Gardner is a  67 y.o. male with a diagnosis of Gangrene Bilateral Legs who failed conservative measures and elected for surgical management.    The risks benefits and alternatives were discussed with the patient preoperatively including but not limited to the risks of infection, bleeding, nerve injury, cardiopulmonary complications, the need for revision surgery, among others, and the patient was willing to proceed.  OPERATIVE IMPLANTS: Praveena wound VAC x2  @  OPERATIVE FINDINGS: Calcified femoral vessels with severe vascular disease  OPERATIVE PROCEDURE: Patient brought the operating room and underwent a general anesthetic.  After adequate levels of anesthesia were obtained patient's both lower extremities were prepped using DuraPrep draped into a sterile field a timeout was called.  Attention was first focused on the right lower extremity.  A fishmouth incision was made just proximal to the quad tendon.  The incision was carried down to the intermuscular septum medially the vascular bundle was identified suture-ligated with 2-0 silk and the remainder of the amputation was completed.  The femur was resected with a reciprocating saw.  Further hemostasis was obtained.  The deep and superficial fascia layers and skin was closed using 2-0 nylon.  Attention was then focused on the left lower extremity.  A fishmouth incision was made just proximal to the quad tendon.  This was carried sharply down to the intermuscular septum this was divided the vascular bundle was identified and suture ligated with 2-0 silk.  This was completely calcified.  The  amputation was completed the femur was transected with a reciprocating saw.  Further hemostasis was obtained.  The deep superficial fascial layer and skin was closed using 2-0 nylon.  A Praveena wound VAC was applied on both lower extremities this had a good suction fit.  Patient was extubated taken the PACU in stable condition.   DISCHARGE PLANNING:  Antibiotic duration: Antibiotics for 24 hours  Weightbearing: Transfers only  Pain medication: Opiate protocol ordered  Dressing care/ Wound VAC: Continue wound vacs for 1 week  Ambulatory devices: Transfers only  Discharge to: Skilled nursing facility  Follow-up: In the office 1 week post operative.

## 2018-01-13 NOTE — Anesthesia Preprocedure Evaluation (Addendum)
Anesthesia Evaluation  Patient identified by MRN, date of birth, ID band Patient awake    Reviewed: Allergy & Precautions, NPO status , Patient's Chart, lab work & pertinent test results, reviewed documented beta blocker date and time   History of Anesthesia Complications Negative for: history of anesthetic complications  Airway Mallampati: II  TM Distance: >3 FB Neck ROM: Full    Dental  (+) Dental Advisory Given   Pulmonary asthma , Current Smoker, former smoker,    Pulmonary exam normal breath sounds clear to auscultation       Cardiovascular Exercise Tolerance: Poor hypertension, Pt. on medications and Pt. on home beta blockers + angina + CAD, + Past MI, + CABG, + Peripheral Vascular Disease and +CHF  Normal cardiovascular exam Rhythm:Regular Rate:Normal  EKG - SR with RBBB and LPFB  TTE 2018 - mild LVH. EF was 50%. There is hypokinesis of the basal-midinferolateral and inferior myocardium. Grade 1 diastolic dysfunction. Mild AI and MR, trivial TR. Mildly dilated LA.  Cath 2018 - 1. Severe 3 vessel CAD with total occlusion of the RCA, total occlusion of the mid-circumflex, and severe LAD/diagonal stenosis 2. 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 Total occlusion of all SVG's with the SVG-unknown target (probable OM1) as the patient's culprit occlusion 4. Moderate segmental LV systolic dysfunction  Per cardiology - "Complex situation in patient with critical limb ischemia, very poor functional capacity, and consideration of aorto-bifemoral bypass. He is not a candidate for acute intervention and medical therapy may be the most reasonable approach. The subclavian stenosis and ostial diagonal stenosis could be approached percutaneously but I'm not sure that would alter this patient's clinical course/prognosis. In my opinion he will be at high cardiac risk of  major vascular surgery"   Neuro/Psych  Headaches, PSYCHIATRIC DISORDERS Anxiety Depression Schizophrenia CVA, Residual Symptoms    GI/Hepatic negative GI ROS, Neg liver ROS,   Endo/Other  diabetes, Type 2, Oral Hypoglycemic Agents  Renal/GU Renal disease  negative genitourinary   Musculoskeletal  (+) Arthritis ,   Abdominal   Peds  Hematology negative hematology ROS (+)   Anesthesia Other Findings  History includes smoking, HTN, CAD/MI (CABG X4 '12 at [facility]; one of four grafts patent-->LIMA to LAD 06/2017), psychotic affective disorder (unclear 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  - Admitted 06/26/17 - 07/01/17 with NSTEMI with unstable angina (took 13+ Nitro) with initial troponin 3.44. Patient agreed to [facility] (see below). Per discharge summary, "He was taken for cardiac cath noted above with Dr. Excell Seltzer that showed severe 3v disease with 1/4 patent grafts (LIMA to the LAD) with moderate LV dysfunction. He was not considered a candidate for acute intervention. It was also fel that he would be high risk for cardiac surgery. Plan was for medical therapy. Follow up echo showed EF of 50%. Troponin peaked at 15.88, Hgb A1c 7.7, LDL 58. He was continued on IV nitro post cath, but able to wean to Imdur. He was continued on heparin for an additional 48 hours post cath. His case was discussed with Dr. Edilia Bo who consulted on the patient this admission. If was felt that bilateral lower extremity vas surgery would be difficult and high risk. Plan for left subclavian stent implantation to be done in the OR at a later date. Dr. Katrinka Blazing added  plavix, and pletal was stopped with agreement with Dr. Edilia Bo. For now his PAD will be treated medically. Medical therapy was titrated to include metoprolol  BID, lisinopril 2.5mg   daily, Imdur  daily, Lipitor  daily, ASA and plavix." - Same day admission 06/08/17 with progressive ischemia BLE. He underwent PV angiogram (see below) with revascularization recommended with aortobifemoral bypass and left femoropopliteal bypass versus axillobifemoral bypass and left femoropopliteal bypass vs primary amputation if felt too high risk for surgery.  - Admitted 04/25/17 - 04/29/17 with NSTEMI (troponin 0.72) with chronic RBBB and LPFB. He refused cardiac cath. Had an episode of unresponsiveness that admission. Neuro consulted. EEG findings suggestive of nonspecific generalized cerebral dysfunction, no a left form activity seen. EF 60-65%.   PC[facility] is through the [facility]. Primary cardiologist is with the [facility]. Locally, his primary cardiologist is Dr. Nicki Guadalajara, although there has been several cardiologists with [facility] that have seen patient since 04/2017. Patient was a no show for his hospital follow-up appointment with Harrell Lark, PA-C today. Dr. Edilia Bo reported that he had talked with cardiologist Dr. Tonny Bollman twice about this patient who was aware of surgical plans.    Meds currently listed (patient bringing in meds) includes ASA 81 mg, Excedrin Migraine, Lipitor, Plavix, Imdur, lisinopril, metformin, Lopressor, Remeron, Nitro, Zoloft.  EKG 06/28/17: NSR, right BB, left posterior fascicular block, bifascicular block, possible inferior infarct (age undetermined).  Echo 06/27/17: Study Conclusions - Left ventricle: The cavity size was normal. Wall thickness was increased in a pattern of mild LVH. The estimated ejection fraction was 50%. There is hypokinesis of the basal-midinferolateral and inferior myocardium. Doppler parameters are consistent with abnormal left ventricular relaxation (grade 1 diastolic dysfunction). - Aortic valve: There was mild regurgitation. - Mitral valve: There was mild regurgitation. - Left atrium: The  atrium was mildly dilated. - Right atrium: Central venous pressure (est): 3 mm Hg. - Tricuspid valve: There was trivial regurgitation. - Pulmonary arteries: Systolic pressure could not be accurately estimated. - Pericardium, extracardiac: There was no pericardial effusion. Impressions: - Mild LVH with LVEF approximately 50%. There is hypokinesis of the mid to basal inferior/inferolateral walls. Grade 1 diastolic dysfunction. Mild mitral regurgitation. Mild left atrial enlargement. Mild aortic regurgitation. Trivial tricuspid regurgitation.  LHC 06/26/17: 1. Severe 3 vessel CAD with total occlusion of the RCA, total occlusion of the mid-circumflex, and severe LAD/diagonal stenosis 2. 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 Total occlusion of all SVG's with the SVG-unknown target (probable OM1) as the patient's culprit occlusion 4. Moderate segmental LV systolic dysfunction Complex situation in patient with critical limb ischemia, very poor functional capacity, and consideration of aorto-bifemoral bypass. He is not a candidate for acute intervention and medical therapy may be the most reasonable approach. The subclavian stenosis and ostial diagonal stenosis could be approached percutaneously but I'm not sure that would alter this patient's clinical course/prognosis. In my opinion he will be at high cardiac risk of major vascular surgery.  Aortogram with BLE runoff 06/08/17: 1. 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 2. 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 3. There is diffuse aortoiliac occlusive disease and also bilateral hypogastric artery occlusions. 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 4. On the left side, there is diffuse disease of the common iliac artery and external iliac artery. The common  femoral artery is  patent. There is a tight stenosis of the proximal deep femoral artery on the left. The superficial femoral arteries occluded at its origin with reconstitution of the popliteal artery at the level of the knee. There is two-vessel runoff on the left via the anterior tibial and posterior tibial arteries which have mild diffuse disease. 5. On the right side, there is diffuse disease of the common iliac artery and external iliac artery. There is also significant plaque in the common femoral artery. The deep femoral artery is patent. The superficial femoral arteries occluded at its origin. There is reconstitution of the popliteal artery at the level of the knee. There is single-vessel runoff on the right via the posterior tibial artery. CLINICAL NOTE: This patient has multilevel arterial occlusive disease with rest pain and wound on his left foot. This is clearly a limb threatening situation. The situation is complicated by the fact that he has a sacral decubitus, has routine calorie malnutrition, and just had a non-ST MI. His options for revascularization would be an aortobifemoral bypass and left femoropopliteal bypass versus axillobifemoral bypass and left femoropopliteal bypass. He is at high-risk for surgery given his medical comorbidities. He will need preoperative cardiac evaluation. The third alternative would be primary amputation if he was felt to be too high risk for surgery.  CXR 06/26/17: FINDINGS: Median sternotomy wires unchanged. Lungs are adequately inflated with mild stable elevation of the left hemidiaphragm. There is no focal airspace consolidation or effusion. Cardiomediastinal silhouette and remainder of the exam is unchanged. IMPRESSION: No active cardiopulmonary disease.  CTA chest/abd/pelvis 04/25/17: IMPRESSION: 1. No evidence of thoracic or abdominal aortic dissection. No evidence of significant pulmonary embolus. 2. Extensive aortic and branch vessel  atherosclerosis with calcific and noncalcific atherosclerotic changes. 3. Ulcerated plaque formation demonstrated in the aortic arch. 4. Moderate stenosis of the distal abdominal aorta. High-grade stenosis of the celiac axis with reconstitution of flow via collaterals. Moderate stenosis of both renal artery origins. Areas of stenosis demonstrated in both common iliac arteries, both external iliac arteries, both common femoral arteries, and occlusion of both internal iliac arteries. 5. Diffuse emphysematous changes in the lungs. Chronic bronchitic changes. Atelectasis or infiltration demonstrated in both lung bases.  He will need updated labs prior to surgery. As of 07/01/17, Cr 1.29, glucose 132, H/H 10.2/31.1, PLT 156. A1c 7.7 on 06/26/17.  Patient with NSTEMI X 2 since 04/2017. Medical therapy recommended following 06/2017 cath.    Reproductive/Obstetrics                             Anesthesia Physical  Anesthesia Plan  ASA: IV  Anesthesia Plan: General   Post-op Pain Management:    Induction: Intravenous  PONV Risk Score and Plan: 3 and Treatment may vary due to age or medical condition, Dexamethasone, Ondansetron and Diphenhydramine  Airway Management Planned: LMA  Additional Equipment:   Intra-op Plan:   Post-operative Plan: Possible Post-op intubation/ventilation  Informed Consent: I have reviewed the patients History and Physical, chart, labs and discussed the procedure including the risks, benefits and alternatives for the proposed anesthesia with the patient or authorized representative who has indicated his/her understanding and acceptance.   Dental advisory given  Plan Discussed with: CRNA  Anesthesia Plan Comments:        Anesthesia Quick Evaluation

## 2018-01-13 NOTE — H&P (Signed)
Jordan Gardner is an 67 y.o. male.   Chief Complaint: Ischemic leg pain both legs HPI: Patient is a 67 year old gentleman with severe peripheral vascular disease he does not have revascularization options and does not have good circulation from the popliteal fossa distally.  Patient presents complaining of increasing burning throbbing aching pain in both feet with black gangrenous changes of all his toes.  Patient states that this is been present for 6 months he was taking Percocet for pain and cannot tolerate the discomfort anymore.  Patient states that he does not have enough strength at this time to ambulate does not have enough strength in his arms to hold himself up.    Past Medical History:  Diagnosis Date  . Anxiety   . Arthritis   . Asthma   . Coronary artery disease   . Depression   . Diabetes mellitus without complication (HCC)    type 2  . Headache   . Hyperlipidemia   . Hypertension   . Myocardial infarction (HCC)   . Neuromuscular disorder (HCC)   . PAD (peripheral artery disease) (HCC)   . Protein calorie malnutrition (HCC) 04/2017  . Psychotic affective disorder (HCC)    Per sister. Unsure of Dx. Pt sleeps late and difficult to wake. odd affect  . Sacral decubitus ulcer 04/2017  . Stroke [facility])    left side weakness    Past Surgical History:  Procedure Laterality Date  . ABDOMINAL AORTOGRAM W/LOWER EXTREMITY N/A 06/08/2017   Procedure: ABDOMINAL AORTOGRAM W/LOWER EXTREMITY;  Surgeon: Chuck Hint, MD;  Location: [facility] INVASIVE CV LAB;  Service: Cardiovascular;  Laterality: N/A;  . BACK SURGERY    . CARDIAC CATHETERIZATION    . CARDIAC SURGERY     CABG x5 2012 - [facility]  . CAROTID-SUBCLAVIAN BYPASS GRAFT Left 07/23/2017   Procedure: BYPASS SUBCLAVIAN ARTERY  USING VIABAHN GORE STENT GRAFT;  Surgeon: Chuck Hint, MD;  Location: [facility] OR;  Service: Vascular;  Laterality: Left;  . LEFT HEART CATH AND CORS/GRAFTS ANGIOGRAPHY N/A 06/26/2017   Procedure:  LEFT HEART CATH AND CORS/GRAFTS ANGIOGRAPHY;  Surgeon: Tonny Bollman, MD;  Location: [facility] INVASIVE CV LAB;  Service: Cardiovascular;  Laterality: N/A;    Family History  Problem Relation Age of Onset  . Hypertension Sister    Social History:  reports that he has quit smoking. His smoking use included cigarettes. He smoked 1.00 pack per day. He has never used smokeless tobacco. He reports that he does not drink alcohol or use drugs.  Allergies: No Known Allergies  No medications prior to admission.    No results found for this or any previous visit (from the past 48 hour(s)). No results found.  Review of Systems  All other systems reviewed and are negative.   There were no vitals taken for this visit. Physical Exam  Patient is alert, oriented, no adenopathy, well-dressed, normal affect, normal respiratory effort. Examination placements both legs are cold to the touch he has black gangrenous changes to the toes with ischemic changes of the entire foot there is no cellulitis no odor no drainage no signs of infection.   Assessment/Plan 1. 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 Gangrene of foot (HCC)   3. Severe protein-calorie malnutrition (HCC)     Plan: We will plan for an above-the-knee amputation bilaterally.  We will set patient up for surgery at [facility] patient would like to proceed with surgery next week.  He will need to return  to skilled nursing after his admission.  Risk and benefits were discussed including risk of the wounds not healing.     Jordan Mustard, MD 01/13/2018, 6:39 AM

## 2018-01-13 NOTE — Transfer of Care (Signed)
Immediate Anesthesia Transfer of Care Note  Patient: Jordan Gardner  Procedure(s) Performed: BILATERAL ABOVE KNEE AMPUTATION (Bilateral Knee)  Patient Location: PACU  Anesthesia Type:General  Level of Consciousness: drowsy and patient cooperative  Airway & Oxygen Therapy: Patient Spontanous Breathing and Patient connected to face mask oxygen  Post-op Assessment: Report given to RN and Post -op Vital signs reviewed and stable  Post vital signs: Reviewed and stable  Last Vitals:  Vitals Value Taken Time  BP 122/71 01/13/2018 12:15 PM  Temp    Pulse 67 01/13/2018 12:17 PM  Resp 13 01/13/2018 12:18 PM  SpO2 99 % 01/13/2018 12:17 PM  Vitals shown include unvalidated device data.  Last Pain: There were no vitals filed for this visit.    Patients Stated Pain Goal: 2 (01/13/18 0956)  Complications: No apparent anesthesia complications

## 2018-01-13 NOTE — Anesthesia Postprocedure Evaluation (Signed)
Anesthesia Post Note  Patient: Jordan Gardner  Procedure(s) Performed: BILATERAL ABOVE KNEE AMPUTATION (Bilateral Knee)     Patient location during evaluation: PACU Anesthesia Type: General Level of consciousness: sedated Pain management: pain level controlled Vital Signs Assessment: post-procedure vital signs reviewed and stable Respiratory status: spontaneous breathing and respiratory function stable Cardiovascular status: stable Postop Assessment: no apparent nausea or vomiting Anesthetic complications: no    Last Vitals:  Vitals:   01/13/18 1300 01/13/18 1315  BP: 140/68 (!) 108/57  Pulse: 72 66  Resp: 15 14  Temp:    SpO2: 97% 99%    Last Pain:  Vitals:   01/13/18 1315  PainSc: Asleep                 Emmagrace Runkel DANIEL

## 2018-01-13 NOTE — Anesthesia Procedure Notes (Signed)
Procedure Name: LMA Insertion Date/Time: 01/13/2018 11:18 AM Performed by: Army Fossa, CRNA Pre-anesthesia Checklist: Patient identified, Emergency Drugs available, Suction available and Patient being monitored Patient Re-evaluated:Patient Re-evaluated prior to induction Oxygen Delivery Method: Circle System Utilized Preoxygenation: Pre-oxygenation with 100% oxygen Induction Type: IV induction Ventilation: Mask ventilation without difficulty LMA: LMA inserted LMA Size: 4.0 Number of attempts: 1 Airway Equipment and Method: Bite block Placement Confirmation: positive ETCO2 Tube secured with: Tape Dental Injury: Teeth and Oropharynx as per pre-operative assessment

## 2018-01-13 NOTE — Progress Notes (Signed)
Patient blood glucose 67, notified MD, new orders received. Will continue to monitor.

## 2018-01-14 ENCOUNTER — Encounter (HOSPITAL_COMMUNITY): Payer: Self-pay | Admitting: Orthopedic Surgery

## 2018-01-14 LAB — GLUCOSE, CAPILLARY: Glucose-Capillary: 117 mg/dL — ABNORMAL HIGH (ref 65–99)

## 2018-01-14 MED ORDER — PRO-STAT SUGAR FREE PO LIQD
30.0000 mL | Freq: Two times a day (BID) | ORAL | Status: DC
  Administered 2018-01-14 – 2018-01-18 (×7): 30 mL via ORAL
  Filled 2018-01-14 (×7): qty 30

## 2018-01-14 MED ORDER — ADULT MULTIVITAMIN W/MINERALS CH
1.0000 | ORAL_TABLET | Freq: Every day | ORAL | Status: DC
  Administered 2018-01-14 – 2018-01-18 (×5): 1 via ORAL
  Filled 2018-01-14 (×5): qty 1

## 2018-01-14 NOTE — Progress Notes (Signed)
Patient ID: Jordan Gardner, male   DOB: October 12, 1950, 67 y.o.   MRN: [id number] Patient is postoperative day 1 bilateral above-the-knee amputations.  There is no drainage and wound canister 200 cc in the other canister.  Patient states he feels better today than he did preoperatively.  We will plan for discharge to skilled nursing.  Most likely discharge on Monday.

## 2018-01-14 NOTE — Evaluation (Signed)
Occupational Therapy Evaluation Patient Details Name: Lakyn Mantione MRN: [id number] DOB: 18-Oct-1950 Today's Date: 01/14/2018    History of Present Illness Pt is a 67 y/o male s/p bilateral transfemoral amputations. PMH including but not limited to PVD, DM, HTN, hx of MI, hx of CVA.   Clinical Impression   Pt admitted for bilateral transfemoral amputations. Pt currently with functional limitations due to the deficits listed below (see OT Problem List).  Pt will benefit from skilled OT to increase their safety and independence with ADL and functional mobility for ADL to facilitate discharge to venue listed below.      Follow Up Recommendations  SNF;Supervision/Assistance - 24 hour    Equipment Recommendations  Other (comment)(TBA at SNF)    Recommendations for Other Services       Precautions / Restrictions Precautions Precautions: Fall Restrictions Weight Bearing Restrictions: Yes RLE Weight Bearing: Non weight bearing LLE Weight Bearing: Non weight bearing      Mobility Bed Mobility Overal bed mobility: Needs Assistance Bed Mobility: Rolling Rolling: Max assist            Transfers          NT                ADL either performed or assessed with clinical judgement   ADL Overall ADL's : Needs assistance/impaired Eating/Feeding: Moderate assistance;Bed level   Grooming: Moderate assistance;Bed level Grooming Details (indicate cue type and reason): washing face                                     Vision Patient Visual Report: No change from baseline       Perception     Praxis      Pertinent Vitals/Pain Pain Assessment: Faces Pain Score: 9  Pain Location: buttocks, bilateral residual limbs Pain Descriptors / Indicators: Aching;Grimacing;Guarding Pain Intervention(s): Limited activity within patient's tolerance;Monitored during session;Repositioned;Patient requesting pain meds-RN notified     Hand Dominance      Extremity/Trunk Assessment Upper Extremity Assessment Upper Extremity Assessment: Generalized weakness LUE Deficits / Details: residual L sided weaknes from previous stroke up able to reach for small medicine cup and bring to mouth           Communication Communication Communication: No difficulties   Cognition Arousal/Alertness: Awake/alert Behavior During Therapy: Flat affect Overall Cognitive Status: No family/caregiver present to determine baseline cognitive functioning Area of Impairment: Problem solving                             Problem Solving: Slow processing;Requires verbal cues;Requires tactile cues     General Comments    OT session focused on self feeding.  Pt held food in his mouth (fruit cocktail). RN notified.             Home Living Family/patient expects to be discharged to:: Skilled nursing facility   Available Help at Discharge: Family;Available 24 hours/day   Home Access: Stairs to enter Entrance Stairs-Number of Steps: 4   Home Layout: One level     Bathroom Shower/Tub: Tub/shower unit         Home Equipment: None          Prior Functioning/Environment Level of Independence: Needs assistance                 OT Problem List: Decreased strength;Decreased range of  motion;Decreased activity tolerance;Impaired balance (sitting and/or standing);Decreased coordination;Decreased cognition;Decreased safety awareness;Decreased knowledge of use of DME or AE;Impaired sensation;Impaired UE functional use;Pain;Increased edema      OT Treatment/Interventions: Self-care/ADL training;Therapeutic exercise;DME and/or AE instruction;Therapeutic activities;Cognitive remediation/compensation;Patient/family education;Balance training    OT Goals(Current goals can be found in the care plan section) Acute Rehab OT Goals Patient Stated Goal: decrease pain OT Goal Formulation: With patient Time For Goal Achievement: 01/28/18 Potential to  Achieve Goals: Fair  OT Frequency: Min 2X/week   Barriers to D/C: Decreased caregiver support             AM-PAC PT "6 Clicks" Daily Activity     Outcome Measure Help from another person eating meals?: A Lot Help from another person taking care of personal grooming?: A Lot Help from another person toileting, which includes using toliet, bedpan, or urinal?: Total Help from another person bathing (including washing, rinsing, drying)?: Total Help from another person to put on and taking off regular upper body clothing?: Total Help from another person to put on and taking off regular lower body clothing?: Total 6 Click Score: 8   End of Session Nurse Communication: Mobility status;Other (comment);Need for lift equipment(need for Prevalon boots; need to assist with feeding; maximo)  Activity Tolerance: Patient limited by pain Patient left: in bed;with call bell/phone within reach;with bed alarm set  OT Visit Diagnosis: Other abnormalities of gait and mobility (R26.89);Repeated falls (R29.6);Muscle weakness (generalized) (M62.81);Other symptoms and signs involving cognitive function;Feeding difficulties (R63.3);Dizziness and giddiness (R42);Hemiplegia and hemiparesis;Pain Hemiplegia - Right/Left: Left Pain - part of body: Leg(B)                Time: 8657-8469 OT Time Calculation (min): 13 min Charges:  OT Evaluation $OT Eval Moderate Complexity: 1 Mod G-Codes:     Lise Auer, OT [phone]  Einar Crow D 01/14/2018, 1:06 PM

## 2018-01-14 NOTE — NC FL2 (Signed)
[place] MEDICAID FL2 LEVEL OF CARE SCREENING TOOL     IDENTIFICATION  Patient Name: Jordan Gardner Birthdate: 26-Feb-1951 Sex: male Admission Date (Current Location): 01/13/2018  [facility] and [place] Number:  Producer, television/film/video and Address:  [facility], [address] [place], [place] [address]      Provider Number: [id number]  Attending Physician Name and Address:  Nadara Mustard, MD  Relative Name and Phone Number:  Georgana Curio - Sister; [phone]    Current Level of Care: Hospital Recommended Level of Care: Skilled Nursing Facility Prior Approval Number:    Date Approved/Denied:   PASRR Number: [id number] (valid until 6/16)  Discharge Plan: SNF    Current Diagnoses: Patient Active Problem List   Diagnosis Date Noted  . Above knee amputation of left lower extremity (HCC) 01/13/2018  . Atherosclerosis of native arteries of extremities with gangrene, left leg (HCC)   . Atherosclerosis of native arteries of extremities with gangrene, right leg (HCC)   . Leukocytosis 12/26/2017  . Essential hypertension 12/26/2017  . Gangrene of foot (HCC) 10/29/2017  . Acute hypoxemic respiratory failure (HCC) 07/28/2017  . 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 Critical lower limb ischemia   . 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 AKI (acute kidney injury) (HCC) 04/28/2017  . Malnutrition of moderate degree 04/28/2017  . Pressure injury of skin 04/27/2017  . Severe protein-calorie malnutrition (HCC) 04/27/2017  . Physical deconditioning 04/27/2017  . Ulcer of great toe (HCC) 04/27/2017  . Claudication (HCC) 04/27/2017  . Sacral decubitus ulcer 04/27/2017  . Accelerated hypertension   . Hyperlipidemia   . Tobacco abuse disorder   . Altered mental status   . PAD (peripheral artery  disease) (HCC)   . NSTEMI (non-ST elevated myocardial infarction) (HCC) 04/25/2017  . NECK PAIN 10/16/2008  . BACK PAIN 10/16/2008    Orientation RESPIRATION BLADDER Height & Weight     Self, Time, Situation, Place  Normal Continent Weight: 174 lb (78.9 kg) Height:     BEHAVIORAL SYMPTOMS/MOOD NEUROLOGICAL BOWEL NUTRITION STATUS      Continent Diet(Carb modified, thin)  AMBULATORY STATUS COMMUNICATION OF NEEDS Skin   Total Care Verbally Other (Comment), Wound Vac, Skin abrasions(Stage 2 pressure injury to medial coccyx with foam dressing; Fissure medial coccyx; Bilateral BKA with wound vac)                       Personal Care Assistance Level of Assistance  Bathing, Feeding, Dressing Bathing Assistance: Maximum assistance Feeding assistance: Limited assistance Dressing Assistance: Maximum assistance     Functional Limitations Info  Sight, Hearing, Speech Sight Info: Adequate Hearing Info: Adequate Speech Info: Adequate    SPECIAL CARE FACTORS FREQUENCY  PT (By licensed PT), OT (By licensed OT)     PT Frequency: 5x week OT Frequency: 5x week            Contractures Contractures Info: Not present    Additional Factors Info  Code Status, Allergies, Psychotropic Code Status Info: Full Code Allergies Info: No Known Allergies Psychotropic Info: Remeron and Zoloft         Current Medications (01/14/2018):  This is the current hospital active medication list Current Facility-Administered Medications  Medication Dose Route Frequency Provider Last Rate Last Dose  . 0.9 %  sodium chloride infusion  Intravenous Continuous Nadara Mustard, MD 10 mL/hr at 01/13/18 1621    . acetaminophen (TYLENOL) tablet 325-650 mg  325-650 mg Oral Q6H PRN Nadara Mustard, MD   650 mg at 01/14/18 1610  . aspirin EC tablet 325 mg  325 mg Oral Daily Nadara Mustard, MD   325 mg at 01/14/18 0848  . atorvastatin (LIPITOR) tablet 80 mg  80 mg Oral QHS Nadara Mustard, MD   80 mg at 01/13/18  2124  . bisacodyl (DULCOLAX) suppository 10 mg  10 mg Rectal Daily PRN Nadara Mustard, MD      . docusate sodium (COLACE) capsule 100 mg  100 mg Oral BID Nadara Mustard, MD   100 mg at 01/14/18 0849  . feeding supplement (GLUCERNA SHAKE) (GLUCERNA SHAKE) liquid 237 mL  237 mL Oral BID BM Nadara Mustard, MD   237 mL at 01/14/18 1322  . furosemide (LASIX) tablet 40 mg  40 mg Oral Daily Nadara Mustard, MD   40 mg at 01/14/18 0849  . gabapentin (NEURONTIN) capsule 100 mg  100 mg Oral BID Nadara Mustard, MD   100 mg at 01/14/18 0850  . HYDROmorphone (DILAUDID) injection 0.5-1 mg  0.5-1 mg Intravenous Q4H PRN Nadara Mustard, MD   1 mg at 01/14/18 1151  . isosorbide mononitrate (IMDUR) 24 hr tablet 60 mg  60 mg Oral Daily Scarlett Presto, RPH   60 mg at 01/14/18 0848  . lisinopril (PRINIVIL,ZESTRIL) tablet 2.5 mg  2.5 mg Oral Daily Nadara Mustard, MD   2.5 mg at 01/14/18 0848  . magnesium citrate solution 1 Bottle  1 Bottle Oral Once PRN Nadara Mustard, MD      . megestrol (MEGACE) tablet 20 mg  20 mg Oral Daily Nadara Mustard, MD   20 mg at 01/14/18 0850  . metFORMIN (GLUCOPHAGE) tablet 1,000 mg  1,000 mg Oral BID WC Nadara Mustard, MD   1,000 mg at 01/14/18 0813  . methocarbamol (ROBAXIN) tablet 500 mg  500 mg Oral Q6H PRN Nadara Mustard, MD   500 mg at 01/14/18 0847   Or  . methocarbamol (ROBAXIN) 500 mg in dextrose 5 % 50 mL IVPB  500 mg Intravenous Q6H PRN Nadara Mustard, MD      . metoCLOPramide (REGLAN) tablet 5-10 mg  5-10 mg Oral Q8H PRN Nadara Mustard, MD       Or  . metoCLOPramide (REGLAN) injection 5-10 mg  5-10 mg Intravenous Q8H PRN Nadara Mustard, MD      . metoprolol tartrate (LOPRESSOR) tablet 25 mg  25 mg Oral BID Nadara Mustard, MD   25 mg at 01/14/18 1030  . mirtazapine (REMERON) tablet 15 mg  15 mg Oral QHS Nadara Mustard, MD   15 mg at 01/13/18 2122  . nicotine (NICODERM CQ - dosed in mg/24 hours) patch 14 mg  14 mg Transdermal Daily Nadara Mustard, MD   14 mg at 01/14/18 0848  .  nitroGLYCERIN (NITROSTAT) SL tablet 0.4 mg  0.4 mg Sublingual Q5 Min x 3 PRN Nadara Mustard, MD      . ondansetron [facility]) tablet 4 mg  4 mg Oral Q6H PRN Nadara Mustard, MD       Or  . ondansetron [facility]) injection 4 mg  4 mg Intravenous Q6H PRN Nadara Mustard, MD      . oxyCODONE (Oxy IR/ROXICODONE) immediate release tablet 10-15 mg  10-15 mg Oral Q4H PRN Nadara Mustard, MD   15 mg at 01/14/18 1314  . oxyCODONE (Oxy IR/ROXICODONE) immediate release tablet 5-10 mg  5-10 mg Oral Q4H PRN Nadara Mustard, MD   10 mg at 01/13/18 1259  . polyethylene glycol (MIRALAX / GLYCOLAX) packet 17 g  17 g Oral Daily PRN Nadara Mustard, MD      . sertraline (ZOLOFT) tablet 200 mg  200 mg Oral Daily Nadara Mustard, MD   200 mg at 01/14/18 1610     Discharge Medications: Please see discharge summary for a list of discharge medications.  Relevant Imaging Results:  Relevant Lab Results:   Additional Information [id number]  Tresa Moore, LCSW

## 2018-01-14 NOTE — Evaluation (Addendum)
Physical Therapy Evaluation Patient Details Name: Jordan Gardner MRN: [id number] DOB: March 17, 1951 Today's Date: 01/14/2018   History of Present Illness  Pt is a 67 y/o male s/p bilateral transfemoral amputations. PMH including but not limited to PVD, DM, HTN, hx of MI, hx of CVA.  Clinical Impression  Pt presented supine in bed with HOB elevated, awake and willing to participate in therapy session. No family/caregivers present to provide any reliable history or home environment information. Pt very flat and vague with answering questions. Pt currently requires mod A x2 for bed mobility and to maintain upright sitting. Pt very limited secondary to weakness and pain. Pt left in L sidelying with pillow under L side/hip. Pt would continue to benefit from skilled physical therapy services at this time while admitted and after d/c to address the below listed limitations in order to improve overall safety and independence with functional mobility.     Follow Up Recommendations SNF    Equipment Recommendations  None recommended by PT    Recommendations for Other Services       Precautions / Restrictions Precautions Precautions: Fall Restrictions Weight Bearing Restrictions: Yes RLE Weight Bearing: Non weight bearing LLE Weight Bearing: Non weight bearing      Mobility  Bed Mobility Overal bed mobility: Needs Assistance Bed Mobility: Supine to Sit;Sit to Supine     Supine to sit: Mod assist;+2 for physical assistance;HOB elevated Sit to supine: Mod assist;+2 for physical assistance;HOB elevated   General bed mobility comments: mod A x2 to achieve long sitting in bed with use of bed rails  Transfers                    Ambulation/Gait                Stairs            Wheelchair Mobility    Modified Rankin (Stroke Patients Only)       Balance Overall balance assessment: Needs assistance Sitting-balance support: Bilateral upper extremity  supported Sitting balance-Leahy Scale: Poor Sitting balance - Comments: mod A x2 and bilateral UE supports Postural control: Posterior lean                                   Pertinent Vitals/Pain Pain Assessment: Faces Faces Pain Scale: Hurts whole lot Pain Location: buttocks, bilateral residual limbs Pain Descriptors / Indicators: Aching;Grimacing;Guarding Pain Intervention(s): Monitored during session;Repositioned;RN gave pain meds during session    Home Living Family/patient expects to be discharged to:: Private residence   Available Help at Discharge: Family;Available 24 hours/day   Home Access: Stairs to enter   Entrance Stairs-Number of Steps: 4 Home Layout: One level Home Equipment: None      Prior Function           Comments: pt focused on pain this session and vague with answering questions; per chart review, prior to amputations pt was ambulating with RW or SPC and required assistance with ADLs     Hand Dominance        Extremity/Trunk Assessment   Upper Extremity Assessment Upper Extremity Assessment: Generalized weakness;LUE deficits/detail LUE Deficits / Details: residual L sided weaknes from previous stroke up able to reach for small medicine cup and bring to mouth    Lower Extremity Assessment Lower Extremity Assessment: LLE deficits/detail;RLE deficits/detail RLE Deficits / Details: ACE wrap and wound VAC in place; MMT revealed 1/5 for  hip flexion; other assessment deferred secondary to pain RLE: Unable to fully assess due to pain LLE Deficits / Details: ACE wrap and wound VAC in place; MMT revealed 1/5 for hip flexion; other assessment deferred secondary to pain LLE: Unable to fully assess due to pain       Communication   Communication: No difficulties  Cognition Arousal/Alertness: Awake/alert Behavior During Therapy: Flat affect Overall Cognitive Status: No family/caregiver present to determine baseline cognitive  functioning Area of Impairment: Problem solving                             Problem Solving: Slow processing;Requires verbal cues;Requires tactile cues        General Comments      Exercises     Assessment/Plan    PT Assessment Patient needs continued PT services  PT Problem List Decreased strength;Decreased range of motion;Decreased activity tolerance;Decreased balance;Decreased mobility;Decreased coordination;Decreased cognition;Decreased knowledge of use of DME;Decreased safety awareness;Decreased knowledge of precautions;Pain       PT Treatment Interventions DME instruction;Gait training;Stair training;Functional mobility training;Therapeutic activities;Therapeutic exercise;Balance training;Neuromuscular re-education;Patient/family education    PT Goals (Current goals can be found in the Care Plan section)  Acute Rehab PT Goals Patient Stated Goal: decrease pain PT Goal Formulation: With patient Time For Goal Achievement: 01/28/18 Potential to Achieve Goals: Fair    Frequency Min 2X/week   Barriers to discharge        Co-evaluation               AM-PAC PT "6 Clicks" Daily Activity  Outcome Measure Difficulty turning over in bed (including adjusting bedclothes, sheets and blankets)?: Unable Difficulty moving from lying on back to sitting on the side of the bed? : Unable Difficulty sitting down on and standing up from a chair with arms (e.g., wheelchair, bedside commode, etc,.)?: Unable Help needed moving to and from a bed to chair (including a wheelchair)?: Total Help needed walking in hospital room?: Total Help needed climbing 3-5 steps with a railing? : Total 6 Click Score: 6    End of Session   Activity Tolerance: Patient limited by pain;Patient limited by fatigue Patient left: in bed;with call bell/phone within reach;with bed alarm set Nurse Communication: Mobility status PT Visit Diagnosis: Other abnormalities of gait and mobility  (R26.89);Pain Pain - Right/Left: (bilateral) Pain - part of body: Leg    Time: 0806-0829 PT Time Calculation (min) (ACUTE ONLY): 23 min   Charges:   PT Evaluation $PT Eval Moderate Complexity: 1 Mod PT Treatments $Therapeutic Activity: 8-22 mins   PT G Codes:        Saline, PT, DPT [phone]   Alessandra Bevels Darionna Banke 01/14/2018, 10:02 AM

## 2018-01-14 NOTE — Clinical Social Work Note (Signed)
Clinical Social Work Assessment  Patient Details  Name: Jordan Gardner MRN: [id number] Date of Birth: 20-Jul-1951  Date of referral:  01/14/18               Reason for consult:  Facility Placement                Permission sought to share information with:  Chartered certified accountant granted to share information::  Yes, Verbal Permission Granted  Name::     Neurosurgeon::  SNF  Relationship::  sister  Contact Information:     Housing/Transportation Living arrangements for the past 2 months:  Single Family Home Source of Information:  Siblings, Patient Patient Interpreter Needed:  None Criminal Activity/Legal Involvement Pertinent to Current Situation/Hospitalization:  No - Comment as needed Significant Relationships:  Other Family Members, Siblings Lives with:  Siblings Do you feel safe going back to the place where you live?  No Need for family participation in patient care:  Yes (Comment)  Care giving concerns:  Pt recently went to [facility] on 5/17 from the hospital and returned for medical care and will need SNF at discharge per clinical team.  Social Worker assessment / plan:  CSW met with patient and sister, Jordan Gardner, at bedside. Pt responded when engaged, however, sister communicated for patient.  Sister confirmed SNF placement at [facility] and desires for patient to return. Sister, minimally assisted patient at home. CSW explained SNF process and placement. CSW explained that Insurance Auth will be needed for placement. CSW obtained permission to send referral back to [facility] for SNF placement.  CSW will f/u for disposition.     +  Employment status:  Retired, Disabled (Comment on whether or not currently receiving Disability) Insurance information:  Programmer, applications PT Recommendations:  [place] / Referral to community resources:  [place]  Patient/Family's Response to care:  Probation officer.  Patient/Family's Understanding of and Emotional Response to Diagnosis, Current Treatment, and Prognosis:  Patient/family has good understanding of impairment and are in agreement that patient will need SNF at discharge. CSW discussed long term plan for patient as sister not able to care for patient and she is working on the long-term plan for patient. Appears patient has VA benefits as well.  CSW will continue to follow up for disposition.  Emotional Assessment Appearance:  Appears older than stated age Attitude/Demeanor/Rapport:  (Cooperative, despite being flat) Affect (typically observed):  Flat, Accepting Orientation:  Oriented to Self, Oriented to  Time, Oriented to Situation Alcohol / Substance use:  Not Applicable Psych involvement (Current and /or in the community):  No (Comment)  Discharge Needs  Concerns to be addressed:  Discharge Planning Concerns Readmission within the last 30 days:  Yes Current discharge risk:  Dependent with Mobility, Physical Impairment Barriers to Discharge:  No Barriers Identified   Normajean Baxter, LCSW 01/14/2018, 4:04 PM

## 2018-01-14 NOTE — Progress Notes (Addendum)
Initial Nutrition Assessment  DOCUMENTATION CODES:   Not applicable  INTERVENTION:    Prostat liquid protein po 30 ml BID with meals, each supplement provides 100 kcal, 15 grams protein  Glucerna Shake po TID, each supplement provides 220 kcal and 10 grams of protein  MVI with minerals po daily  NUTRITION DIAGNOSIS:   Increased nutrient needs related to post-op healing as evidenced by estimated needs  GOAL:   Patient will meet greater than or equal to 90% of their needs  MONITOR:   PO intake, Supplement acceptance, Labs, Skin, I & O's, Weight trends  REASON FOR ASSESSMENT:   Consult Assessment of nutrition requirement/status  ASSESSMENT:   67 yo Male with PMH of HTN, CAD, sacral decubitus ulcer, HLD, PAD, stroke and neuromuscular disorder who was admitted with gangrene bilateral legs.   Pt s/p procedure 5/29: BILATERAL ABOVE KNEE AMPUTATION BILATERAL PRAVEENA INCISIONAL WOUND VAC'S  Pt is minimally interactive with RD. He is watching TV and drinking coffee. He reports his appetite isn't very good. He does not know what he had for lunch. States he's lost 50 lbs in the last 6 months, however, weight readings do not reflect this.  Likes vanilla and chocolate Ensure Enlive supplements. Glucerna Shake ordered. Medications reviewed and include Megace. Labs reviewed. CBG's [id number].  Noted RN sticky note from today. Pt may benefit from swallow evaluation. He is having trouble chewing and holds food in his mouth.  NUTRITION - FOCUSED PHYSICAL EXAM:  Unable to complete at this time. Pt in pain.  Diet Order:   Diet Order           Diet Carb Modified Fluid consistency: Thin; Room service appropriate? Yes  Diet effective now         EDUCATION NEEDS:   No education needs have been identified at this time  Skin:  Skin Assessment: Skin Integrity Issues: Skin Integrity Issues:: Stage II, Wound VAC Stage II: coccyx Wound Vac: bilateral AKA's  Last BM:   5/26  Height:   Ht Readings from Last 1 Encounters:  12/26/17  (1.854 m)   Weight:   Wt Readings from Last 1 Encounters:  01/13/18 174 lb (78.9 kg)   Wt Readings from Last 15 Encounters:  01/13/18 174 lb (78.9 kg)  01/01/18 174 lb 2.6 oz (79 kg)  12/22/17 155 lb 3.2 oz (70.4 kg)  11/25/17 163 lb 14.4 oz (74.3 kg)  10/29/17 158 lb (71.7 kg)  09/08/17 158 lb 14.4 oz (72.1 kg)  08/02/17 168 lb 10.4 oz (76.5 kg)  07/23/17 179 lb (81.2 kg)  07/01/17 174 lb 1.6 oz (79 kg)  06/08/17 180 lb (81.6 kg)  05/27/17 178 lb 3.2 oz (80.8 kg)  04/29/17 198 lb (89.8 kg)  01/20/15 195 lb (88.5 kg)   BMI:  28.9 kg/m2 (adjusted for bilateral AKA's)  Estimated Nutritional Needs:   Kcal:  2000-2200  Protein:  105-120 gm  Fluid:  2.0-2.2 L  Maureen Chatters, RD, LDN Pager #: [phone] After-Hours Pager #: [phone]

## 2018-01-15 LAB — GLUCOSE, CAPILLARY: Glucose-Capillary: 132 mg/dL — ABNORMAL HIGH (ref 65–99)

## 2018-01-15 NOTE — Progress Notes (Signed)
Patient ID: Jordan Gardner, male   DOB: [birth year], 67 y.o.   MRN: [id number] Wound vacs functioning well.  Approximately 200 cc in the one canister and 50 cc in the other.  Patient without complaints this morning.  Plan for discharge to skilled nursing when a bed is available.

## 2018-01-15 NOTE — Social Work (Signed)
CSW communicated with SNF on 5/30 and they indicated that they will f/u for bed availability despite the fact that patient was recently dc to them on 5/17 for SNF.  CSW f/u with SNF-[facility] to confirm bed offer and that patient can return. CSW advised tht they will need to get auth started. CSW waiting for confirmation from SNF.  CSW will continue to follow up for disposition.  Keene Breath, LCSW Clinical Social Worker [phone]

## 2018-01-15 NOTE — Care Management Important Message (Signed)
Important Message  Patient Details  Name: Jordan Gardner MRN: [id number] Date of Birth: [birth year]   Medicare Important Message Given:  Yes    Anshu Wehner Stefan Church 01/15/2018, 3:54 PM

## 2018-01-16 LAB — GLUCOSE, CAPILLARY
GLUCOSE-CAPILLARY: 110 mg/dL — AB (ref 65–99)
GLUCOSE-CAPILLARY: 142 mg/dL — AB (ref 65–99)
GLUCOSE-CAPILLARY: 103 mg/dL — AB (ref 65–99)
Glucose-Capillary: 122 mg/dL — ABNORMAL HIGH (ref 65–99)

## 2018-01-16 MED ORDER — DIPHENOXYLATE-ATROPINE 2.5-0.025 MG PO TABS
1.0000 | ORAL_TABLET | Freq: Four times a day (QID) | ORAL | Status: DC | PRN
  Administered 2018-01-16: 1 via ORAL
  Filled 2018-01-16: qty 1

## 2018-01-16 MED ORDER — OXYCODONE-ACETAMINOPHEN 5-325 MG PO TABS: 1.0000 | ORAL_TABLET | ORAL | 0 refills | Status: AC | PRN

## 2018-01-16 NOTE — Progress Notes (Addendum)
Wound vac dressing dressing removed, incisions with sutures intact, scant bleeding noted from the incision. Dry dressing applied covered with compression wrap. 1500 Pt with loose stools today, Dr Lajoyce Cornersuda notified. Talked to social worker Clarisse GougeBridget twice today regarding discharge, she is waiting for insurance authorization.

## 2018-01-16 NOTE — Discharge Summary (Signed)
Discharge Diagnoses:  Active Problems:   Atherosclerosis of native arteries of extremities with gangrene, left leg (HCC)   Atherosclerosis of native arteries of extremities with gangrene, right leg (HCC)   Above knee amputation of left lower extremity (HCC)   Surgeries: Procedure(s): BILATERAL ABOVE KNEE AMPUTATION on 01/13/2018    Consultants:   Discharged Condition: Improved  Hospital Course: Jordan Gardner is an 67 y.o. male who was admitted 01/13/2018 with a chief complaint of gangrene both legs, with a final diagnosis of Gangrene Bilateral Legs.  Patient was brought to the operating room on 01/13/2018 and underwent Procedure(s): BILATERAL ABOVE KNEE AMPUTATION.    Patient was given perioperative antibiotics:  Anti-infectives (From admission, onward)   Start     Dose/Rate Route Frequency Ordered Stop   01/13/18 1800  ceFAZolin (ANCEF) IVPB 1 g/50 mL premix     1 g 100 mL/hr over 30 Minutes Intravenous Every 6 hours 01/13/18 1548 01/14/18 0757   01/13/18 1000  ceFAZolin (ANCEF) IVPB 2g/100 mL premix     2 g 200 mL/hr over 30 Minutes Intravenous To ShortStay Surgical 01/12/18 1058 01/13/18 1121    .  Patient was given sequential compression devices, early ambulation, and aspirin for DVT prophylaxis.  Recent vital signs:  Patient Vitals for the past 24 hrs:  BP Temp Temp src Pulse Resp SpO2  01/16/18 0526 130/80 98.6 F (37 C) Oral 69 - 99 %  01/15/18 2052 125/81 99 F (37.2 C) Oral 85 - 96 %  01/15/18 1420 121/67 98.8 F (37.1 C) Oral 85 14 98 %  .  Recent laboratory studies: No results found.  Discharge Medications:   Allergies as of 01/16/2018   No Known Allergies     Medication List    STOP taking these medications   oxyCODONE 5 MG immediate release tablet Commonly known as:  Oxy IR/ROXICODONE     TAKE these medications   aspirin 81 MG EC tablet Take 1 tablet (81 mg total) by mouth daily.   aspirin-acetaminophen-caffeine 250-250-65 MG tablet Commonly  known as:  EXCEDRIN MIGRAINE Take 2 tablets by mouth every 8 (eight) hours as needed for headache.   atorvastatin 80 MG tablet Commonly known as:  LIPITOR Take 80 mg by mouth at bedtime.   docusate sodium 100 MG capsule Commonly known as:  COLACE Take 100 mg by mouth daily as needed for mild constipation.   feeding supplement (GLUCERNA SHAKE) Liqd Take 237 mLs by mouth 2 (two) times daily between meals.   furosemide 40 MG tablet Commonly known as:  LASIX Take 1 tablet (40 mg total) by mouth daily.   gabapentin 100 MG capsule Commonly known as:  NEURONTIN Take 1 capsule (100 mg total) by mouth 2 (two) times daily.   isosorbide mononitrate 30 MG 24 hr tablet Commonly known as:  IMDUR Take 2 tablets (60 mg total) daily by mouth.   lisinopril 2.5 MG tablet Commonly known as:  PRINIVIL,ZESTRIL Take 1 tablet (2.5 mg total) daily by mouth.   megestrol 20 MG tablet Commonly known as:  MEGACE Take 20 mg by mouth daily.   metFORMIN 500 MG tablet Commonly known as:  GLUCOPHAGE Take 1,000 mg by mouth 2 (two) times daily with a meal.   metoprolol tartrate 50 MG tablet Commonly known as:  LOPRESSOR Take 0.5 tablets (25 mg total) by mouth 2 (two) times daily.   mirtazapine 15 MG tablet Commonly known as:  REMERON Take 15 mg by mouth at bedtime.   multivitamin with  minerals Tabs tablet Take 1 tablet by mouth daily.   nicotine 14 mg/24hr patch Commonly known as:  NICODERM CQ - dosed in mg/24 hours Place 1 patch (14 mg total) onto the skin daily.   nitroGLYCERIN 0.4 MG SL tablet Commonly known as:  NITROSTAT Place 1 tablet (0.4 mg total) under the tongue every 5 (five) minutes x 3 doses as needed for chest pain.   oxyCODONE-acetaminophen 5-325 MG tablet Commonly known as:  PERCOCET/ROXICET Take 1 tablet by mouth every 4 (four) hours as needed for severe pain.   sertraline 100 MG tablet Commonly known as:  ZOLOFT Take 200 mg by mouth daily. DEPRESSION/ANXIETY             Discharge Care Instructions  (From admission, onward)        Start     Ordered   01/16/18 0000  Non weight bearing    Question Answer Comment  Laterality bilateral   Extremity Lower      01/16/18 0848      Diagnostic Studies: Dg Chest 1 View  Result Date: 12/25/2017 CLINICAL DATA:  Proper evaluation for foot amputation, history hypertension, type II diabetes mellitus, stroke, coronary artery disease, smoker EXAM: CHEST  1 VIEW COMPARISON:  Portable exam 1713 hours compared to 07/31/2017 FINDINGS: Upper normal size of cardiac silhouette post CABG. Mediastinal contours and pulmonary vascularity normal. Skin folds project over the lungs bilaterally. Lungs clear. No acute infiltrate, pleural effusion or pneumothorax. Bones unremarkable. IMPRESSION: Post CABG. No acute abnormalities. Electronically Signed   By: Ulyses SouthwardMark  Boles M.D.   On: 12/25/2017 18:42   Dg Foot Complete Left  Result Date: 12/25/2017 CLINICAL DATA:  Necrotic wounds at both feet EXAM: LEFT FOOT - COMPLETE 3+ VIEW COMPARISON:  10/29/2017 FINDINGS: Mild osseous demineralization. Joint spaces preserved. No acute fracture, dislocation, or bone destruction. Soft tissues unremarkable. IMPRESSION: No acute abnormalities. Electronically Signed   By: Ulyses SouthwardMark  Boles M.D.   On: 12/25/2017 18:38   Dg Foot Complete Right  Result Date: 12/25/2017 CLINICAL DATA:  Necrotic wounds at both feet EXAM: RIGHT FOOT COMPLETE - 3+ VIEW COMPARISON:  10/29/2017 FINDINGS: Osseous demineralization. Advanced degenerative changes at first MTP joint. Mild degenerative changes at IP joint great toe. Remaining joint spaces fairly well preserved. No acute fracture, dislocation, or bone destruction. Soft tissues radiographically unremarkable. 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 No acute bony abnormalities. Electronically Signed   By: Ulyses SouthwardMark  Boles M.D.   On: 12/25/2017 18:39     Patient benefited maximally from their hospital stay and there were no complications.     Disposition: Discharge disposition: 03-Skilled Nursing Facility      Discharge Instructions    Call MD / Call 911   Complete by:  As directed    If you experience chest pain or shortness of breath, CALL 911 and be transported to the hospital emergency room.  If you develope a fever above 101 F, pus (white drainage) or increased drainage or redness at the wound, or calf pain, call your surgeon's office.   Constipation Prevention   Complete by:  As directed    Drink plenty of fluids.  Prune juice may be helpful.  You may use a stool softener, such as Colace (over the counter) 100 mg twice a day.  Use MiraLax (over the counter) for constipation as needed.   Diet - low sodium heart healthy   Complete by:  As directed  Increase activity slowly as tolerated   Complete by:  As directed    Non weight bearing   Complete by:  As directed    Laterality:  bilateral   Extremity:  Lower     Follow-up Information    Nadara Mustard, MD In 1 week.   Specialty:  Orthopedic Surgery Contact information: [address] [place] [address] [phone]            Signed: Nadara Mustard 01/16/2018, 8:48 AM

## 2018-01-17 LAB — GLUCOSE, CAPILLARY
GLUCOSE-CAPILLARY: 99 mg/dL (ref 65–99)
Glucose-Capillary: 99 mg/dL (ref 65–99)
Glucose-Capillary: 185 mg/dL — ABNORMAL HIGH (ref 65–99)

## 2018-01-17 NOTE — Progress Notes (Signed)
Patient ID: Jordan Gardner, male   DOB: 09/18/1950, 67 y.o.   MRN: [id number] Patient had multiple bowel movements yesterday he states that his diahhrea has resolved.  Plan for discharge to skilled nursing once insurance approves discharge anticipated discharge on Monday.

## 2018-01-18 ENCOUNTER — Encounter (HOSPITAL_COMMUNITY): Payer: Self-pay | Admitting: *Deleted

## 2018-01-18 LAB — GLUCOSE, CAPILLARY
GLUCOSE-CAPILLARY: 150 mg/dL — AB (ref 65–99)
Glucose-Capillary: 127 mg/dL — ABNORMAL HIGH (ref 65–99)
Glucose-Capillary: 135 mg/dL — ABNORMAL HIGH (ref 65–99)
Glucose-Capillary: 128 mg/dL — ABNORMAL HIGH (ref 65–99)

## 2018-01-18 NOTE — Progress Notes (Signed)
Patient ID: Jordan Gardner, male   DOB: 06/23/1951, 67 y.o.   MRN: [id number] Patient is status post bilateral above-the-knee amputations.  He states his diarrhea has resolved.  Patient states he does have pain in his legs the dressings are clean and dry and are loosely wrapped.  Plan for discharge to skilled nursing today.

## 2018-01-18 NOTE — Social Work (Addendum)
SNF confirmed bed offer. SNF will initiate Insurance auth.  CSW will need PT to see patient again for Insurance Auth as patient has not been evaluated in last 24 hours.  Pt will go to [facility] when auth received.  Keene BreathPatricia Dehaven Sine, LCSW Clinical Social Worker [phone]

## 2018-01-18 NOTE — Plan of Care (Signed)
  Problem: Health Behavior/Discharge Planning: Goal: Ability to manage health-related needs will improve Outcome: Progressing   

## 2018-01-18 NOTE — Social Work (Signed)
Clinical Social Worker facilitated patient discharge including contacting patient family and facility to confirm patient discharge plans.  Clinical information faxed to facility and family agreeable with plan.    SW arranged ambulance transport via [facility] to [facility].    RN to call [phone] to give report prior to discharge. Pt going to Room 210.  Clinical Social Worker will sign off for now as social work intervention is no longer needed. Please consult us again if new need arises.  Keene BreathPatricia Anisten Tomassi, LCSW Clinical Social Worker [phone]

## 2018-01-18 NOTE — Clinical Social Work Placement (Signed)
   CLINICAL SOCIAL WORK PLACEMENT  NOTE  Date:  01/18/2018  Patient Details  Name: Jordan Gardner MRN: [id number] Date of Birth: 12/28/1950  Clinical Social Work is seeking post-discharge placement for this patient at the Skilled  Nursing Facility level of care (*CSW will initial, date and re-position this form in  chart as items are completed):  Yes   Patient/family provided with [facility] Clinical Social Work Department's list of facilities offering this level of care within the geographic area requested by the patient (or if unable, by the patient's family).  Yes   Patient/family informed of their freedom to choose among providers that offer the needed level of care, that participate in Medicare, Medicaid or managed care program needed by the patient, have an available bed and are willing to accept the patient.  Yes   Patient/family informed of [facility]'s ownership interest in [facility] and [facility], as well as of the fact that they are under no obligation to receive care at these facilities.  PASRR submitted to EDS on       PASRR number received on       Existing PASRR number confirmed on 01/14/18     FL2 transmitted to all facilities in geographic area requested by pt/family on       FL2 transmitted to all facilities within larger geographic area on 01/14/18     Patient informed that his/her managed care company has contracts with or will negotiate with certain facilities, including the following:        Yes   Patient/family informed of bed offers received.  [name]     Physician recommends and patient chooses bed at      [name].  Patient to be transferred to facility by [facility]     Patient family notified on 01/18/18 of transfer.  Name of family member notified:  sister, [name]     PHYSICIAN       Additional Comment:     _______________________________________________ Tresa MoorePatricia V Lamichael Youkhana, LCSW 01/18/2018, 4:04 PM

## 2018-01-18 NOTE — Progress Notes (Signed)
Physical Therapy Treatment Patient Details Name: Jordan Gardner MRN: [id number] DOB: 05/14/1951 Today's Date: 01/18/2018    History of Present Illness Pt is a 67 y/o male s/p bilateral transfemoral amputations. PMH including but not limited to PVD, DM, HTN, hx of MI, hx of CVA.    PT Comments    Pt presents supine in bed sleeping.  Sister (older) at bedside who reports he sleeps a lot at home.  Remains difficult to address cognition as he is minimally conversive.  Pt is able to follow commands but due to strength deficits his ability to mobilize remains limited.  Pt did however progress to OOB to recliner chair.  As sitting tolerance improves he core strength should as well.  Pt remains to require SNF at this time as he currently requires max-total+2 assistance to mobilize.     Follow Up Recommendations  SNF     Equipment Recommendations  None recommended by PT    Recommendations for Other Services       Precautions / Restrictions Precautions Precautions: Fall Restrictions Weight Bearing Restrictions: Yes RLE Weight Bearing: Non weight bearing LLE Weight Bearing: Non weight bearing    Mobility  Bed Mobility Overal bed mobility: Needs Assistance Bed Mobility: Rolling(to R and L for pad placement.  Pt required facilitation at hip and shoulder to turn to each direction.  Pt able to reach for railing but minimal effort offered by patient.  ) Rolling: Max assist   Supine to sit: Max assist;+2 for physical assistance;Total assist(performed long sitting from elevated HOB, patient offeres minimal to no assistance.  )     General bed mobility comments: Pt required max to total assistance for rolling to R and L and to achieve long sitting from elevated HOB.  Pt able to follow commands for hand placement but due to weakness his ability to mobilize remains limited.    Transfers Overall transfer level: Needs assistance Equipment used: None(bed pad for AP transfer assist.   ) Transfers: Anterior-Posterior Transfer       Anterior-Posterior transfers: Total assist;+2 physical assistance   General transfer comment: Pt performed posterior scoot from bed to recliner with assist to scoot and for trunk control.  Pt remains to follow commands for transfer OOB but due to strength deficits he is unable to sit upright without total assistance.    Ambulation/Gait                 Stairs             Wheelchair Mobility    Modified Rankin (Stroke Patients Only)       Balance Overall balance assessment: Needs assistance   Sitting balance-Leahy Scale: Poor Sitting balance - Comments: Max assistance to maintain sitting.   Postural control: Posterior lean                                  Cognition Arousal/Alertness: Awake/alert Behavior During Therapy: Flat affect Overall Cognitive Status: Difficult to assess                                        Exercises      General Comments        Pertinent Vitals/Pain Pain Assessment: Faces Faces Pain Scale: Hurts little more Pain Location: buttocks, bilateral residual limbs Pain Descriptors / Indicators: Aching;Grimacing;Guarding Pain Intervention(s):  Monitored during session;Repositioned    Home Living                      Prior Function            PT Goals (current goals can now be found in the care plan section) Acute Rehab PT Goals Patient Stated Goal: decrease pain Potential to Achieve Goals: Fair Progress towards PT goals: Progressing toward goals(slow progression)    Frequency    Min 2X/week      PT Plan Current plan remains appropriate    Co-evaluation              AM-PAC PT "6 Clicks" Daily Activity  Outcome Measure  Difficulty turning over in bed (including adjusting bedclothes, sheets and blankets)?: Unable Difficulty moving from lying on back to sitting on the side of the bed? : Unable Difficulty sitting down on and  standing up from a chair with arms (e.g., wheelchair, bedside commode, etc,.)?: Unable Help needed moving to and from a bed to chair (including a wheelchair)?: Total Help needed walking in hospital room?: Total Help needed climbing 3-5 steps with a railing? : Total 6 Click Score: 6    End of Session Equipment Utilized During Treatment: Gait belt Activity Tolerance: Patient limited by pain;Patient limited by fatigue Patient left: in bed;with call bell/phone within reach;with bed alarm set Nurse Communication: Mobility status PT Visit Diagnosis: Other abnormalities of gait and mobility (R26.89);Pain Pain - Right/Left: (Bilateral) Pain - part of body: Leg     Time: 1023-1040 PT Time Calculation (min) (ACUTE ONLY): 17 min  Charges:  $Therapeutic Activity: 8-22 mins                    G Codes:       Jordan Gardner, PTA pager [phone]    Jordan Gardner 01/18/2018, 10:52 AM

## 2018-01-18 NOTE — Progress Notes (Signed)
Report called to [facility] at this time 

## 2018-01-18 NOTE — Discharge Summary (Signed)
Discharge Diagnoses:  Active Problems:   Atherosclerosis of native arteries of extremities with gangrene, left leg (HCC)   Atherosclerosis of native arteries of extremities with gangrene, right leg (HCC)   Above knee amputation of left lower extremity (HCC)   Surgeries: Procedure(s): BILATERAL ABOVE KNEE AMPUTATION on 01/13/2018    Consultants:   Discharged Condition: Improved  Hospital Course: Jordan Gardner is an 67 y.o. male who was admitted 01/13/2018 with a chief complaint of gangrene bilateral legs, with a final diagnosis of Gangrene Bilateral Legs.  Patient was brought to the operating room on 01/13/2018 and underwent Procedure(s): BILATERAL ABOVE KNEE AMPUTATION.    Patient was given perioperative antibiotics:  Anti-infectives (From admission, onward)   Start     Dose/Rate Route Frequency Ordered Stop   01/13/18 1800  ceFAZolin (ANCEF) IVPB 1 g/50 mL premix     1 g 100 mL/hr over 30 Minutes Intravenous Every 6 hours 01/13/18 1548 01/14/18 0757   01/13/18 1000  ceFAZolin (ANCEF) IVPB 2g/100 mL premix     2 g 200 mL/hr over 30 Minutes Intravenous To ShortStay Surgical 01/12/18 1058 01/13/18 1121    .  Patient was given sequential compression devices, early ambulation, and aspirin for DVT prophylaxis.  Recent vital signs:  Patient Vitals for the past 24 hrs:  BP Temp Temp src Pulse SpO2  01/18/18 0417 131/77 97.6 F (36.4 C) Oral 80 100 %  01/17/18 2012 98/86 99.3 F (37.4 C) Oral 88 100 %  01/17/18 1412 118/69 98.7 F (37.1 C) Oral 71 99 %  .  Recent laboratory studies: No results found.  Discharge Medications:   Allergies as of 01/18/2018   No Known Allergies     Medication List    STOP taking these medications   oxyCODONE 5 MG immediate release tablet Commonly known as:  Oxy IR/ROXICODONE     TAKE these medications   aspirin 81 MG EC tablet Take 1 tablet (81 mg total) by mouth daily.   aspirin-acetaminophen-caffeine 250-250-65 MG tablet Commonly  known as:  EXCEDRIN MIGRAINE Take 2 tablets by mouth every 8 (eight) hours as needed for headache.   atorvastatin 80 MG tablet Commonly known as:  LIPITOR Take 80 mg by mouth at bedtime.   docusate sodium 100 MG capsule Commonly known as:  COLACE Take 100 mg by mouth daily as needed for mild constipation.   feeding supplement (GLUCERNA SHAKE) Liqd Take 237 mLs by mouth 2 (two) times daily between meals.   furosemide 40 MG tablet Commonly known as:  LASIX Take 1 tablet (40 mg total) by mouth daily.   gabapentin 100 MG capsule Commonly known as:  NEURONTIN Take 1 capsule (100 mg total) by mouth 2 (two) times daily.   isosorbide mononitrate 30 MG 24 hr tablet Commonly known as:  IMDUR Take 2 tablets (60 mg total) daily by mouth.   lisinopril 2.5 MG tablet Commonly known as:  PRINIVIL,ZESTRIL Take 1 tablet (2.5 mg total) daily by mouth.   megestrol 20 MG tablet Commonly known as:  MEGACE Take 20 mg by mouth daily.   metFORMIN 500 MG tablet Commonly known as:  GLUCOPHAGE Take 1,000 mg by mouth 2 (two) times daily with a meal.   metoprolol tartrate 50 MG tablet Commonly known as:  LOPRESSOR Take 0.5 tablets (25 mg total) by mouth 2 (two) times daily.   mirtazapine 15 MG tablet Commonly known as:  REMERON Take 15 mg by mouth at bedtime.   multivitamin with minerals Tabs tablet Take  1 tablet by mouth daily.   nicotine 14 mg/24hr patch Commonly known as:  NICODERM CQ - dosed in mg/24 hours Place 1 patch (14 mg total) onto the skin daily.   nitroGLYCERIN 0.4 MG SL tablet Commonly known as:  NITROSTAT Place 1 tablet (0.4 mg total) under the tongue every 5 (five) minutes x 3 doses as needed for chest pain.   oxyCODONE-acetaminophen 5-325 MG tablet Commonly known as:  PERCOCET/ROXICET Take 1 tablet by mouth every 4 (four) hours as needed for severe pain.   sertraline 100 MG tablet Commonly known as:  ZOLOFT Take 200 mg by mouth daily. DEPRESSION/ANXIETY             Discharge Care Instructions  (From admission, onward)        Start     Ordered   01/16/18 0000  Non weight bearing    Question Answer Comment  Laterality bilateral   Extremity Lower      01/16/18 0848      Diagnostic Studies: Dg Chest 1 View  Result Date: 12/25/2017 CLINICAL DATA:  Proper evaluation for foot amputation, history hypertension, type II diabetes mellitus, stroke, coronary artery disease, smoker EXAM: CHEST  1 VIEW COMPARISON:  Portable exam 1713 hours compared to 07/31/2017 FINDINGS: Upper normal size of cardiac silhouette post CABG. Mediastinal contours and pulmonary vascularity normal. Skin folds project over the lungs bilaterally. Lungs clear. No acute infiltrate, pleural effusion or pneumothorax. Bones unremarkable. IMPRESSION: Post CABG. No acute abnormalities. Electronically Signed   By: Ulyses Southward M.D.   On: 12/25/2017 18:42   Dg Foot Complete Left  Result Date: 12/25/2017 CLINICAL DATA:  Necrotic wounds at both feet EXAM: LEFT FOOT - COMPLETE 3+ VIEW COMPARISON:  10/29/2017 FINDINGS: Mild osseous demineralization. Joint spaces preserved. No acute fracture, dislocation, or bone destruction. Soft tissues unremarkable. IMPRESSION: No acute abnormalities. Electronically Signed   By: Ulyses Southward M.D.   On: 12/25/2017 18:38   Dg Foot Complete Right  Result Date: 12/25/2017 CLINICAL DATA:  Necrotic wounds at both feet EXAM: RIGHT FOOT COMPLETE - 3+ VIEW COMPARISON:  10/29/2017 FINDINGS: Osseous demineralization. Advanced degenerative changes at first MTP joint. Mild degenerative changes at IP joint great toe. Remaining joint spaces fairly well preserved. No acute fracture, dislocation, or bone destruction. Soft tissues radiographically unremarkable. 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 No acute bony abnormalities. Electronically Signed   By: Ulyses Southward M.D.   On: 12/25/2017 18:39     Patient benefited maximally from their hospital stay and there were no complications.     Disposition: Discharge disposition: 03-Skilled Nursing Facility      Discharge Instructions    Call MD / Call 911   Complete by:  As directed    If you experience chest pain or shortness of breath, CALL 911 and be transported to the hospital emergency room.  If you develope a fever above 101 F, pus (white drainage) or increased drainage or redness at the wound, or calf pain, call your surgeon's office.   Call MD / Call 911   Complete by:  As directed    If you experience chest pain or shortness of breath, CALL 911 and be transported to the hospital emergency room.  If you develope a fever above 101 F, pus (white drainage) or increased drainage or redness at the wound, or calf pain, call your surgeon's office.   Constipation Prevention   Complete by:  As directed    Drink plenty of fluids.  Prune juice may be helpful.  You may use a stool softener, such as Colace (over the counter) 100 mg twice a day.  Use MiraLax (over the counter) for constipation as needed.   Constipation Prevention   Complete by:  As directed    Drink plenty of fluids.  Prune juice may be helpful.  You may use a stool softener, such as Colace (over the counter) 100 mg twice a day.  Use MiraLax (over the counter) for constipation as needed.   Diet - low sodium heart healthy   Complete by:  As directed    Diet - low sodium heart healthy   Complete by:  As directed    Increase activity slowly as tolerated   Complete by:  As directed    Increase activity slowly as tolerated   Complete by:  As directed    Non weight bearing   Complete by:  As directed    Laterality:  bilateral   Extremity:  Lower     Follow-up Information    Nadara Mustarduda, Marshelle Bilger V, MD In 1 week.   Specialty:  Orthopedic Surgery Contact information: [address] [place] [address] [phone]            Signed: Nadara MustardMarcus V  Ebony Rickel 01/18/2018, 7:03 AM

## 2018-01-18 NOTE — Social Work (Signed)
[employer] Auth pending, Asst. Clin [facility] approved a 5 day LOG.  CSW will assist with disposition.  Keene BreathPatricia Mekaylah Klich, LCSW Clinical Social Worker [phone]

## 2018-01-27 ENCOUNTER — Ambulatory Visit (INDEPENDENT_AMBULATORY_CARE_PROVIDER_SITE_OTHER): Payer: Medicare HMO | Admitting: Family

## 2018-01-27 ENCOUNTER — Encounter (INDEPENDENT_AMBULATORY_CARE_PROVIDER_SITE_OTHER): Payer: Self-pay | Admitting: Family

## 2018-01-27 ENCOUNTER — Inpatient Hospital Stay (INDEPENDENT_AMBULATORY_CARE_PROVIDER_SITE_OTHER): Payer: Medicare HMO | Admitting: Orthopedic Surgery

## 2018-01-27 DIAGNOSIS — I70262 Atherosclerosis of native arteries of extremities with gangrene, left leg: Secondary | ICD-10-CM

## 2018-01-27 DIAGNOSIS — Z89611 Acquired absence of right leg above knee: Secondary | ICD-10-CM

## 2018-01-27 DIAGNOSIS — Z89612 Acquired absence of left leg above knee: Secondary | ICD-10-CM

## 2018-01-27 DIAGNOSIS — I70261 Atherosclerosis of native arteries of extremities with gangrene, right leg: Secondary | ICD-10-CM

## 2018-01-27 NOTE — Progress Notes (Signed)
Post-Op Visit Note   Patient: Jordan Gardner           Date of Birth: 10/31/1950           MRN: [id number] Visit Date: 01/27/2018 [facility]  Chief Complaint:  Chief Complaint  Patient presents with  . Left Knee - Pain, Follow-up  . Right Knee - Pain, Follow-up, Drainage from Incision    HPI:  HPI The patient is a 67 year old gentleman seen 2 weeks status post bilateral above knee amputations.   [facility]. Is sleeping on exam today.   Ortho Exam Incisions are well approximated with sutures. Left incision is healing well. No drainage. No erythema. No sign of infection. The right Incision has some central drainage, serosanguinous. No gaping. No palpable abscess or hematoma. No erythema or odor. No sign of infection.  Visit Diagnoses: No diagnosis found.  Plan: sutures harvested from left. Dry dressings applied bilaterally. Cleanse incisions daily with dial soap. Apply dry dressings. Follow up in 1 week for suture removal.  Follow-Up Instructions: No follow-ups on file.   Imaging: No results found.  Orders:  No orders of the defined types were placed in this encounter.  No orders of the defined types were placed in this encounter.    PMFS History: Patient Active Problem List   Diagnosis Date Noted  . Above knee amputation of left lower extremity (HCC) 01/13/2018  . Atherosclerosis of native arteries of extremities with gangrene, left leg (HCC)   . Atherosclerosis of native arteries of extremities with gangrene, right leg (HCC)   . Leukocytosis 12/26/2017  . Essential hypertension 12/26/2017  . Gangrene of foot (HCC) 10/29/2017  . Acute hypoxemic respiratory failure (HCC) 07/28/2017  . 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 Critical lower limb ischemia   . Peripheral vascular disease of lower extremity with  ulceration (HCC) 05/05/2017  . AKI (acute kidney injury) (HCC) 04/28/2017  . Malnutrition of moderate degree 04/28/2017  . Pressure injury of skin 04/27/2017  . Severe protein-calorie malnutrition (HCC) 04/27/2017  . Physical deconditioning 04/27/2017  . Ulcer of great toe (HCC) 04/27/2017  . Claudication (HCC) 04/27/2017  . Sacral decubitus ulcer 04/27/2017  . Accelerated hypertension   . Hyperlipidemia   . Tobacco abuse disorder   . Altered mental status   . PAD (peripheral artery disease) (HCC)   . NSTEMI (non-ST elevated myocardial infarction) (HCC) 04/25/2017  . NECK PAIN 10/16/2008  . BACK PAIN 10/16/2008   Past Medical History:  Diagnosis Date  . Anxiety   . Arthritis   . Asthma   . Coronary artery disease   . Depression   . Diabetes mellitus without complication (HCC)    type 2  . Headache   . Hyperlipidemia   . Hypertension   . Myocardial infarction (HCC)   . Neuromuscular disorder (HCC)   . PAD (peripheral artery disease) (HCC)   . Protein calorie malnutrition (HCC) 04/2017  . Psychotic affective disorder (HCC)    Per sister. Unsure of Dx. Pt sleeps late and difficult to wake. odd affect  . Sacral decubitus ulcer 04/2017  . Stroke [facility](HCC)    left side weakness    Family History  Problem Relation Age of Onset  . Hypertension Sister     Past Surgical History:  Procedure Laterality Date  . ABDOMINAL AORTOGRAM W/LOWER EXTREMITY N/A 06/08/2017   Procedure:  ABDOMINAL AORTOGRAM W/LOWER EXTREMITY;  Surgeon: Chuck Hint, MD;  Location: [facility] INVASIVE CV LAB;  Service: Cardiovascular;  Laterality: N/A;  . ABOVE KNEE LEG AMPUTATION Bilateral 01/13/2018  . AMPUTATION Bilateral 01/13/2018   Procedure: BILATERAL ABOVE KNEE AMPUTATION;  Surgeon: Nadara Mustard, MD;  Location: [facility] OR;  Service: Orthopedics;  Laterality: Bilateral;  . BACK SURGERY    . CARDIAC CATHETERIZATION    . CARDIAC SURGERY     CABG x5 2012 - [facility]  . CAROTID-SUBCLAVIAN BYPASS GRAFT Left  07/23/2017   Procedure: BYPASS SUBCLAVIAN ARTERY  USING VIABAHN GORE STENT GRAFT;  Surgeon: Chuck Hint, MD;  Location: [facility] OR;  Service: Vascular;  Laterality: Left;  . LEFT HEART CATH AND CORS/GRAFTS ANGIOGRAPHY N/A 06/26/2017   Procedure: LEFT HEART CATH AND CORS/GRAFTS ANGIOGRAPHY;  Surgeon: Tonny Bollman, MD;  Location: [facility] INVASIVE CV LAB;  Service: Cardiovascular;  Laterality: N/A;   Social History   Occupational History  . Not on file  Tobacco Use  . Smoking status: Current Some Day Smoker    Packs/day: 1.00    Types: Cigarettes  . Smokeless tobacco: Never Used  Substance and Sexual Activity  . Alcohol use: No  . Drug use: No  . Sexual activity: Not on file

## 2018-02-03 ENCOUNTER — Encounter (INDEPENDENT_AMBULATORY_CARE_PROVIDER_SITE_OTHER): Payer: Self-pay | Admitting: Family

## 2018-02-03 ENCOUNTER — Ambulatory Visit (INDEPENDENT_AMBULATORY_CARE_PROVIDER_SITE_OTHER): Payer: Medicare HMO | Admitting: Family

## 2018-02-03 VITALS — Ht 73.0 in | Wt 174.0 lb

## 2018-02-03 DIAGNOSIS — S78112A Complete traumatic amputation at level between left hip and knee, initial encounter: Secondary | ICD-10-CM

## 2018-02-03 DIAGNOSIS — Z89612 Acquired absence of left leg above knee: Secondary | ICD-10-CM

## 2018-02-03 DIAGNOSIS — Z89611 Acquired absence of right leg above knee: Secondary | ICD-10-CM

## 2018-02-03 DIAGNOSIS — S78111A Complete traumatic amputation at level between right hip and knee, initial encounter: Secondary | ICD-10-CM

## 2018-02-03 MED ORDER — GABAPENTIN 300 MG PO CAPS: 300.0000 mg | ORAL_CAPSULE | Freq: Three times a day (TID) | ORAL | 0 refills | Status: DC

## 2018-02-03 MED ORDER — GABAPENTIN 300 MG PO CAPS: 300.0000 mg | ORAL_CAPSULE | Freq: Three times a day (TID) | ORAL | 0 refills | Status: AC

## 2018-02-04 ENCOUNTER — Encounter (INDEPENDENT_AMBULATORY_CARE_PROVIDER_SITE_OTHER): Payer: Self-pay | Admitting: Family

## 2018-02-04 NOTE — Progress Notes (Signed)
Post-Op Visit Note   Patient: Jordan Gardner           Date of Birth: November 27, 1950           MRN: [id number] Visit Date: 02/03/2018 [facility]: [facility]  Chief Complaint:  Chief Complaint  Patient presents with  . Right Knee - Follow-up  . Left Knee - Follow-up    HPI:  HPI The patient is a 67 year old gentleman seen status post bilateral above knee amputations.   Residing at [facility].   Ortho Exam Incisions are well healed. No drainge. No gaping. No palpable abscess or hematoma. No erythema or odor. No sign of infection.  Visit Diagnoses:  1. Above knee amputation of left lower extremity (HCC)   2. Above knee amputation of right lower extremity (HCC)     Plan: sutures harvested. Dry dressings applied bilaterally. Cleanse incisions daily with dial soap. Apply dry dressings. Follow up in 4 weeks.  Follow-Up Instructions: No follow-ups on file.   Imaging: No results found.  Orders:  No orders of the defined types were placed in this encounter.  Meds ordered this encounter  Medications  . DISCONTD: gabapentin (NEURONTIN) 300 MG capsule    Sig: Take 1 capsule (300 mg total) by mouth 3 (three) times daily.    Dispense:  90 capsule    Refill:  0  . gabapentin (NEURONTIN) 300 MG capsule    Sig: Take 1 capsule (300 mg total) by mouth 3 (three) times daily.    Dispense:  90 capsule    Refill:  0     PMFS History: Patient Active Problem List   Diagnosis Date Noted  . Above knee amputation of right lower extremity (HCC) 02/03/2018  . Above knee amputation of left lower extremity (HCC) 01/13/2018  . Atherosclerosis of native arteries of extremities with gangrene, left leg (HCC)   . Atherosclerosis of native arteries of extremities with gangrene, right leg (HCC)   . Leukocytosis 12/26/2017  . Essential hypertension 12/26/2017  . Acute hypoxemic respiratory failure (HCC) 07/28/2017  . 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 Coronary artery disease  involving coronary bypass graft of native heart with unstable angina pectoris (HCC)   . Critical lower limb ischemia   . 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 AKI (acute kidney injury) (HCC) 04/28/2017  . Malnutrition of moderate degree 04/28/2017  . Pressure injury of skin 04/27/2017  . Severe protein-calorie malnutrition (HCC) 04/27/2017  . Physical deconditioning 04/27/2017  . Claudication (HCC) 04/27/2017  . Sacral decubitus ulcer 04/27/2017  . Accelerated hypertension   . Hyperlipidemia   . Tobacco abuse disorder   . Altered mental status   . PAD (peripheral artery disease) (HCC)   . NSTEMI (non-ST elevated myocardial infarction) (HCC) 04/25/2017  . NECK PAIN 10/16/2008  . BACK PAIN 10/16/2008   Past Medical History:  Diagnosis Date  . Anxiety   . Arthritis   . Asthma   . Coronary artery disease   . Depression   . Diabetes mellitus without complication (HCC)    type 2  . Headache   . Hyperlipidemia   . Hypertension   . Myocardial infarction (HCC)   . Neuromuscular disorder (HCC)   . PAD (peripheral artery disease) (HCC)   . Protein calorie malnutrition (HCC) 04/2017  . Psychotic affective disorder (HCC)    Per sister. Unsure of Dx. Pt sleeps late and difficult to wake. odd affect  . Sacral decubitus ulcer  04/2017  . Stroke [facility](HCC)    left side weakness    Family History  Problem Relation Age of Onset  . Hypertension Sister     Past Surgical History:  Procedure Laterality Date  . ABDOMINAL AORTOGRAM W/LOWER EXTREMITY N/A 06/08/2017   Procedure: ABDOMINAL AORTOGRAM W/LOWER EXTREMITY;  Surgeon: Chuck Hintickson, Christopher S, MD;  [facility];  Service: Cardiovascular;  Laterality: N/A;  . ABOVE KNEE LEG AMPUTATION Bilateral 01/13/2018  . AMPUTATION Bilateral 01/13/2018   Procedure: BILATERAL ABOVE KNEE AMPUTATION;  Surgeon: Nadara Mustarduda, Marcus V, MD;  [facility];  Service: Orthopedics;  Laterality: Bilateral;  .  BACK SURGERY    . CARDIAC CATHETERIZATION    . CARDIAC SURGERY     CABG x5 2012 - [facility]  . CAROTID-SUBCLAVIAN BYPASS GRAFT Left 07/23/2017   Procedure: BYPASS SUBCLAVIAN ARTERY  USING VIABAHN GORE STENT GRAFT;  Surgeon: Chuck Hintickson, Christopher S, MD;  [facility];  Service: Vascular;  Laterality: Left;  . LEFT HEART CATH AND CORS/GRAFTS ANGIOGRAPHY N/A 06/26/2017   Procedure: LEFT HEART CATH AND CORS/GRAFTS ANGIOGRAPHY;  Surgeon: Tonny Bollmanooper, Michael, MD;  [facility];  Service: Cardiovascular;  Laterality: N/A;   Social History   Occupational History  . Not on file  Tobacco Use  . Smoking status: Current Some Day Smoker    Packs/day: 1.00    Types: Cigarettes  . Smokeless tobacco: Never Used  Substance and Sexual Activity  . Alcohol use: No  . Drug use: No  . Sexual activity: Not on file

## 2018-03-23 ENCOUNTER — Encounter (INDEPENDENT_AMBULATORY_CARE_PROVIDER_SITE_OTHER): Payer: Self-pay | Admitting: Orthopedic Surgery

## 2018-03-23 ENCOUNTER — Ambulatory Visit (INDEPENDENT_AMBULATORY_CARE_PROVIDER_SITE_OTHER): Payer: Medicare HMO | Admitting: Orthopedic Surgery

## 2018-03-23 VITALS — Ht 73.0 in | Wt 174.0 lb

## 2018-03-23 DIAGNOSIS — S78112A Complete traumatic amputation at level between left hip and knee, initial encounter: Secondary | ICD-10-CM

## 2018-03-23 DIAGNOSIS — Z89611 Acquired absence of right leg above knee: Secondary | ICD-10-CM

## 2018-03-23 DIAGNOSIS — S78111A Complete traumatic amputation at level between right hip and knee, initial encounter: Secondary | ICD-10-CM

## 2018-03-23 DIAGNOSIS — Z89612 Acquired absence of left leg above knee: Secondary | ICD-10-CM

## 2018-03-23 NOTE — Progress Notes (Signed)
Office Visit Note   Patient: Jordan Gardner           Date of Birth: 10/27/1950           MRN: [id number] Visit Date: 03/23/2018              [facility] [address] [facility] [address] [place], [place] [address] [facility]  Chief Complaint  Patient presents with  . Right Leg - Routine Post Op    01/13/18 bilateral AKA  . Left Leg - Routine Post Op      HPI: Patient is a 67 year old gentleman who presents in follow-up 2 months status post bilateral above-the-knee amputations.  [facility].  Patient did not bring any paperwork with him from the facility.  Assessment & Plan: Visit Diagnoses:  1. Above knee amputation of left lower extremity (HCC)   2. Above knee amputation of right lower extremity (HCC)     Plan: Recommend the patient increase the Neurontin to 300 mg 3 times a day and recommended adding Vicodin for his pain.  We will follow-up in the office as needed discussed the importance of patient developing strength in order for him to possibly use prosthesis.  Follow-Up Instructions: Return if symptoms worsen or fail to improve.   Ortho Exam  Patient is alert, oriented, no adenopathy, well-dressed, normal affect, normal respiratory effort. Examination both incisions have healed nicely there is no redness no cellulitis no signs of infection.  There is no skin color or temperature changes no drainage.  Imaging: No results found. No images are attached to the encounter.  Labs: Lab Results  Component Value Date   HGBA1C 7.1 (H) 07/28/2017   HGBA1C 7.7 (H) 06/26/2017   HGBA1C 7.4 (H) 04/25/2017   REPTSTATUS 12/30/2017 FINAL 12/25/2017   REPTSTATUS 12/30/2017 FINAL 12/25/2017   GRAMSTAIN  07/28/2017    ABUNDANT WBC PRESENT,BOTH PMN AND MONONUCLEAR NO ORGANISMS SEEN    CULT  12/25/2017    NO GROWTH 5 DAYS Performed at [facility], [address] [place], [place]  [address]    CULT  12/25/2017    NO GROWTH 5 DAYS Performed at [facility], [address] [place], [place] [address]      Lab Results  Component Value Date   ALBUMIN 2.4 (L) 12/25/2017   ALBUMIN 3.3 (L) 07/28/2017   ALBUMIN 2.9 (L) 07/23/2017    Body mass index is 22.96 kg/m.  Orders:  No orders of the defined types were placed in this encounter.  No orders of the defined types were placed in this encounter.    Procedures: No procedures performed  Clinical Data: No additional findings.  ROS:  All other systems negative, except as noted in the HPI. Review of Systems  Objective: Vital Signs: Ht 6\' 1"  (1.854 m)   Wt 174 lb (78.9 kg)   BMI 22.96 kg/m   Specialty Comments:  No specialty comments available.  PMFS History: Patient Active Problem List   Diagnosis Date Noted  . Above knee amputation of right lower extremity (HCC) 02/03/2018  . Above knee amputation of left lower extremity (HCC) 01/13/2018  . Atherosclerosis of native arteries of extremities with gangrene, left leg (HCC)   . Atherosclerosis of native arteries of extremities with gangrene, right leg (HCC)   . Leukocytosis 12/26/2017  . Essential hypertension 12/26/2017  . Acute hypoxemic respiratory failure (HCC) 07/28/2017  . 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 Critical lower limb ischemia   . 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 AKI (acute kidney injury) (HCC) 04/28/2017  . Malnutrition of moderate degree 04/28/2017  . Pressure injury of skin 04/27/2017  . Severe protein-calorie malnutrition (HCC) 04/27/2017  . Physical deconditioning 04/27/2017  . Claudication (HCC) 04/27/2017  . Sacral decubitus ulcer 04/27/2017  . Accelerated hypertension   . Hyperlipidemia   . Tobacco abuse disorder   . Altered mental status   .  PAD (peripheral artery disease) (HCC)   . NSTEMI (non-ST elevated myocardial infarction) (HCC) 04/25/2017  . NECK PAIN 10/16/2008  . BACK PAIN 10/16/2008   Past Medical History:  Diagnosis Date  . Anxiety   . Arthritis   . Asthma   . Coronary artery disease   . Depression   . Diabetes mellitus without complication (HCC)    type 2  . Headache   . Hyperlipidemia   . Hypertension   . Myocardial infarction (HCC)   . Neuromuscular disorder (HCC)   . PAD (peripheral artery disease) (HCC)   . Protein calorie malnutrition (HCC) 04/2017  . Psychotic affective disorder (HCC)    Per sister. Unsure of Dx. Pt sleeps late and difficult to wake. odd affect  . Sacral decubitus ulcer 04/2017  . Stroke [facility])    left side weakness    Family History  Problem Relation Age of Onset  . Hypertension Sister     Past Surgical History:  Procedure Laterality Date  . ABDOMINAL AORTOGRAM W/LOWER EXTREMITY N/A 06/08/2017   Procedure: ABDOMINAL AORTOGRAM W/LOWER EXTREMITY;  Surgeon: Chuck Hint, MD;  Location: [facility] INVASIVE CV LAB;  Service: Cardiovascular;  Laterality: N/A;  . ABOVE KNEE LEG AMPUTATION Bilateral 01/13/2018  . AMPUTATION Bilateral 01/13/2018   Procedure: BILATERAL ABOVE KNEE AMPUTATION;  Surgeon: Nadara Mustard, MD;  Location: [facility] OR;  Service: Orthopedics;  Laterality: Bilateral;  . BACK SURGERY    . CARDIAC CATHETERIZATION    . CARDIAC SURGERY     CABG x5 2012 - [facility]  . CAROTID-SUBCLAVIAN BYPASS GRAFT Left 07/23/2017   Procedure: BYPASS SUBCLAVIAN ARTERY  USING VIABAHN GORE STENT GRAFT;  Surgeon: Chuck Hint, MD;  [facility] OR;  Service: Vascular;  Laterality: Left;  . LEFT HEART CATH AND CORS/GRAFTS ANGIOGRAPHY N/A 06/26/2017   Procedure: LEFT HEART CATH AND CORS/GRAFTS ANGIOGRAPHY;  Surgeon: Tonny Bollman, MD;  Location: [facility] INVASIVE CV LAB;  Service: Cardiovascular;  Laterality: N/A;   Social History   Occupational History  . Not on file  Tobacco Use  .  Smoking status: Current Some Day Smoker    Packs/day: 1.00    Types: Cigarettes  . Smokeless tobacco: Never Used  Substance and Sexual Activity  . Alcohol use: No  . Drug use: No  . Sexual activity: Not on file

## 2018-05-18 ENCOUNTER — Telehealth (INDEPENDENT_AMBULATORY_CARE_PROVIDER_SITE_OTHER): Payer: Self-pay | Admitting: Orthopedic Surgery

## 2018-05-18 NOTE — Telephone Encounter (Signed)
Jordan Gardner @ [employer] need order for Functional Test  Physical Therapy Jordan Gardner call back # [phone]

## 2018-05-21 ENCOUNTER — Other Ambulatory Visit (INDEPENDENT_AMBULATORY_CARE_PROVIDER_SITE_OTHER): Payer: Self-pay

## 2018-05-21 DIAGNOSIS — S78112A Complete traumatic amputation at level between left hip and knee, initial encounter: Secondary | ICD-10-CM

## 2018-05-21 NOTE — Telephone Encounter (Signed)
Completed the physical therapy order and sent

## 2018-05-21 NOTE — Telephone Encounter (Signed)
Can you call and ask if they are wanting a functional capacity exam for this pt who has bilateral above the knee amputations? I am not sure what they are wanting.

## 2018-08-01 ENCOUNTER — Encounter (HOSPITAL_COMMUNITY): Payer: Self-pay | Admitting: *Deleted

## 2018-08-01 ENCOUNTER — Emergency Department (HOSPITAL_COMMUNITY): Payer: Medicare HMO

## 2018-08-01 ENCOUNTER — Inpatient Hospital Stay (HOSPITAL_COMMUNITY)
Admission: EM | Admit: 2018-08-01 | Discharge: 2018-08-09 | DRG: 871 | Disposition: A | Discharge status: Skilled Nursing Facility | Payer: Medicare HMO | Attending: Internal Medicine | Admitting: Internal Medicine

## 2018-08-01 ENCOUNTER — Other Ambulatory Visit: Payer: Self-pay

## 2018-08-01 DIAGNOSIS — A419 Sepsis, unspecified organism: Principal | ICD-10-CM | POA: Diagnosis present

## 2018-08-01 DIAGNOSIS — R1310 Dysphagia, unspecified: Secondary | ICD-10-CM | POA: Diagnosis not present

## 2018-08-01 DIAGNOSIS — F329 Major depressive disorder, single episode, unspecified: Secondary | ICD-10-CM | POA: Diagnosis present

## 2018-08-01 DIAGNOSIS — E860 Dehydration: Secondary | ICD-10-CM | POA: Diagnosis present

## 2018-08-01 DIAGNOSIS — Z89612 Acquired absence of left leg above knee: Secondary | ICD-10-CM

## 2018-08-01 DIAGNOSIS — J181 Lobar pneumonia, unspecified organism: Secondary | ICD-10-CM | POA: Diagnosis not present

## 2018-08-01 DIAGNOSIS — L89320 Pressure ulcer of left buttock, unstageable: Secondary | ICD-10-CM | POA: Diagnosis present

## 2018-08-01 DIAGNOSIS — J9601 Acute respiratory failure with hypoxia: Secondary | ICD-10-CM | POA: Diagnosis not present

## 2018-08-01 DIAGNOSIS — Z7984 Long term (current) use of oral hypoglycemic drugs: Secondary | ICD-10-CM

## 2018-08-01 DIAGNOSIS — D638 Anemia in other chronic diseases classified elsewhere: Secondary | ICD-10-CM | POA: Diagnosis present

## 2018-08-01 DIAGNOSIS — R253 Fasciculation: Secondary | ICD-10-CM | POA: Diagnosis present

## 2018-08-01 DIAGNOSIS — I350 Nonrheumatic aortic (valve) stenosis: Secondary | ICD-10-CM | POA: Diagnosis not present

## 2018-08-01 DIAGNOSIS — R4182 Altered mental status, unspecified: Secondary | ICD-10-CM | POA: Diagnosis not present

## 2018-08-01 DIAGNOSIS — R1313 Dysphagia, pharyngeal phase: Secondary | ICD-10-CM | POA: Diagnosis not present

## 2018-08-01 DIAGNOSIS — D649 Anemia, unspecified: Secondary | ICD-10-CM | POA: Diagnosis not present

## 2018-08-01 DIAGNOSIS — I69354 Hemiplegia and hemiparesis following cerebral infarction affecting left non-dominant side: Secondary | ICD-10-CM | POA: Diagnosis not present

## 2018-08-01 DIAGNOSIS — L89310 Pressure ulcer of right buttock, unstageable: Secondary | ICD-10-CM | POA: Diagnosis present

## 2018-08-01 DIAGNOSIS — J45909 Unspecified asthma, uncomplicated: Secondary | ICD-10-CM | POA: Diagnosis present

## 2018-08-01 DIAGNOSIS — Z79899 Other long term (current) drug therapy: Secondary | ICD-10-CM

## 2018-08-01 DIAGNOSIS — F419 Anxiety disorder, unspecified: Secondary | ICD-10-CM | POA: Diagnosis present

## 2018-08-01 DIAGNOSIS — Z951 Presence of aortocoronary bypass graft: Secondary | ICD-10-CM | POA: Diagnosis not present

## 2018-08-01 DIAGNOSIS — I2510 Atherosclerotic heart disease of native coronary artery without angina pectoris: Secondary | ICD-10-CM | POA: Diagnosis present

## 2018-08-01 DIAGNOSIS — I252 Old myocardial infarction: Secondary | ICD-10-CM | POA: Diagnosis not present

## 2018-08-01 DIAGNOSIS — I5022 Chronic systolic (congestive) heart failure: Secondary | ICD-10-CM | POA: Diagnosis present

## 2018-08-01 DIAGNOSIS — R0600 Dyspnea, unspecified: Secondary | ICD-10-CM

## 2018-08-01 DIAGNOSIS — R6520 Severe sepsis without septic shock: Secondary | ICD-10-CM

## 2018-08-01 DIAGNOSIS — E1351 Other specified diabetes mellitus with diabetic peripheral angiopathy without gangrene: Secondary | ICD-10-CM | POA: Diagnosis present

## 2018-08-01 DIAGNOSIS — Z66 Do not resuscitate: Secondary | ICD-10-CM | POA: Diagnosis present

## 2018-08-01 DIAGNOSIS — E43 Unspecified severe protein-calorie malnutrition: Secondary | ICD-10-CM | POA: Diagnosis present

## 2018-08-01 DIAGNOSIS — J189 Pneumonia, unspecified organism: Secondary | ICD-10-CM

## 2018-08-01 DIAGNOSIS — E1151 Type 2 diabetes mellitus with diabetic peripheral angiopathy without gangrene: Secondary | ICD-10-CM | POA: Diagnosis not present

## 2018-08-01 DIAGNOSIS — Z681 Body mass index (BMI) 19 or less, adult: Secondary | ICD-10-CM

## 2018-08-01 DIAGNOSIS — R251 Tremor, unspecified: Secondary | ICD-10-CM | POA: Diagnosis present

## 2018-08-01 DIAGNOSIS — E785 Hyperlipidemia, unspecified: Secondary | ICD-10-CM | POA: Diagnosis present

## 2018-08-01 DIAGNOSIS — J159 Unspecified bacterial pneumonia: Secondary | ICD-10-CM | POA: Diagnosis not present

## 2018-08-01 DIAGNOSIS — F17210 Nicotine dependence, cigarettes, uncomplicated: Secondary | ICD-10-CM | POA: Diagnosis present

## 2018-08-01 DIAGNOSIS — R1013 Epigastric pain: Secondary | ICD-10-CM | POA: Diagnosis not present

## 2018-08-01 DIAGNOSIS — F339 Major depressive disorder, recurrent, unspecified: Secondary | ICD-10-CM | POA: Diagnosis not present

## 2018-08-01 DIAGNOSIS — J9600 Acute respiratory failure, unspecified whether with hypoxia or hypercapnia: Secondary | ICD-10-CM

## 2018-08-01 DIAGNOSIS — R531 Weakness: Secondary | ICD-10-CM | POA: Diagnosis not present

## 2018-08-01 DIAGNOSIS — Z7982 Long term (current) use of aspirin: Secondary | ICD-10-CM

## 2018-08-01 DIAGNOSIS — N179 Acute kidney failure, unspecified: Secondary | ICD-10-CM

## 2018-08-01 DIAGNOSIS — Z8249 Family history of ischemic heart disease and other diseases of the circulatory system: Secondary | ICD-10-CM

## 2018-08-01 DIAGNOSIS — Z89611 Acquired absence of right leg above knee: Secondary | ICD-10-CM | POA: Diagnosis not present

## 2018-08-01 DIAGNOSIS — Z833 Family history of diabetes mellitus: Secondary | ICD-10-CM

## 2018-08-01 DIAGNOSIS — J690 Pneumonitis due to inhalation of food and vomit: Secondary | ICD-10-CM | POA: Diagnosis not present

## 2018-08-01 DIAGNOSIS — I5020 Unspecified systolic (congestive) heart failure: Secondary | ICD-10-CM | POA: Diagnosis not present

## 2018-08-01 DIAGNOSIS — Z79891 Long term (current) use of opiate analgesic: Secondary | ICD-10-CM

## 2018-08-01 DIAGNOSIS — I110 Hypertensive heart disease with heart failure: Secondary | ICD-10-CM | POA: Diagnosis present

## 2018-08-01 LAB — CBC WITH DIFFERENTIAL/PLATELET
Abs Immature Granulocytes: 0.12 10*3/uL — ABNORMAL HIGH (ref 0.00–0.07)
Basophils Absolute: 0 10*3/uL (ref 0.0–0.1)
Basophils Relative: 0 %
EOS ABS: 0.1 10*3/uL (ref 0.0–0.5)
Eosinophils Relative: 0 %
HCT: 41.7 % (ref 39.0–52.0)
Hemoglobin: 12.3 g/dL — ABNORMAL LOW (ref 13.0–17.0)
Immature Granulocytes: 1 %
Lymphocytes Relative: 9 %
Lymphs Abs: 1.9 10*3/uL (ref 0.7–4.0)
MCH: 26.6 pg (ref 26.0–34.0)
MCHC: 29.5 g/dL — AB (ref 30.0–36.0)
MCV: 90.1 fL (ref 80.0–100.0)
Monocytes Absolute: 0.8 10*3/uL (ref 0.1–1.0)
Monocytes Relative: 4 %
Neutro Abs: 18.1 10*3/uL — ABNORMAL HIGH (ref 1.7–7.7)
Neutrophils Relative %: 86 %
Platelets: 179 10*3/uL (ref 150–400)
RBC: 4.63 MIL/uL (ref 4.22–5.81)
RDW: 14.5 % (ref 11.5–15.5)
WBC: 20.9 10*3/uL — AB (ref 4.0–10.5)
nRBC: 0 % (ref 0.0–0.2)

## 2018-08-01 LAB — I-STAT TROPONIN, ED: Troponin i, poc: 0.04 ng/mL (ref 0.00–0.08)

## 2018-08-01 LAB — BASIC METABOLIC PANEL
ANION GAP: 18 — AB (ref 5–15)
BUN: 120 mg/dL — ABNORMAL HIGH (ref 8–23)
CO2: 11 mmol/L — ABNORMAL LOW (ref 22–32)
Calcium: 9.2 mg/dL (ref 8.9–10.3)
Chloride: 114 mmol/L — ABNORMAL HIGH (ref 98–111)
Creatinine, Ser: 2.99 mg/dL — ABNORMAL HIGH (ref 0.61–1.24)
GFR calc Af Amer: 24 mL/min — ABNORMAL LOW (ref >60)
GFR calc non Af Amer: 21 mL/min — ABNORMAL LOW (ref >60)
Glucose, Bld: 210 mg/dL — ABNORMAL HIGH (ref 70–99)
Potassium: 5.5 mmol/L — ABNORMAL HIGH (ref 3.5–5.1)
Sodium: 143 mmol/L (ref 135–145)

## 2018-08-01 LAB — COMPREHENSIVE METABOLIC PANEL
ALT: 19 U/L (ref 0–44)
AST: 29 U/L (ref 15–41)
Albumin: 3.6 g/dL (ref 3.5–5.0)
Alkaline Phosphatase: 94 U/L (ref 38–126)
Anion gap: 19 — ABNORMAL HIGH (ref 5–15)
BILIRUBIN TOTAL: 0.7 mg/dL (ref 0.3–1.2)
BUN: 127 mg/dL — ABNORMAL HIGH (ref 8–23)
CO2: 12 mmol/L — ABNORMAL LOW (ref 22–32)
Calcium: 10 mg/dL (ref 8.9–10.3)
Chloride: 110 mmol/L (ref 98–111)
Creatinine, Ser: 3.17 mg/dL — ABNORMAL HIGH (ref 0.61–1.24)
GFR calc Af Amer: 22 mL/min — ABNORMAL LOW (ref >60)
GFR, EST NON AFRICAN AMERICAN: 19 mL/min — AB (ref >60)
Glucose, Bld: 198 mg/dL — ABNORMAL HIGH (ref 70–99)
Potassium: 6.1 mmol/L — ABNORMAL HIGH (ref 3.5–5.1)
Sodium: 141 mmol/L (ref 135–145)
Total Protein: 7.1 g/dL (ref 6.5–8.1)

## 2018-08-01 LAB — URINALYSIS, ROUTINE W REFLEX MICROSCOPIC
BILIRUBIN URINE: NEGATIVE
Glucose, UA: NEGATIVE mg/dL
HGB URINE DIPSTICK: NEGATIVE
KETONES UR: 5 mg/dL — AB
Leukocytes, UA: NEGATIVE
Nitrite: NEGATIVE
Protein, ur: NEGATIVE mg/dL
Specific Gravity, Urine: 1.016 (ref 1.005–1.030)
pH: 5 (ref 5.0–8.0)

## 2018-08-01 LAB — I-STAT CG4 LACTIC ACID, ED
LACTIC ACID, VENOUS: 4.43 mmol/L — AB (ref 0.5–1.9)
Lactic Acid, Venous: 4.77 mmol/L (ref 0.5–1.9)

## 2018-08-01 LAB — PROCALCITONIN: Procalcitonin: 12.47 ng/mL

## 2018-08-01 LAB — LACTIC ACID, PLASMA
Lactic Acid, Venous: 4.6 mmol/L (ref 0.5–1.9)
Lactic Acid, Venous: 3.4 mmol/L (ref 0.5–1.9)

## 2018-08-01 LAB — BRAIN NATRIURETIC PEPTIDE: B Natriuretic Peptide: 594.2 pg/mL — ABNORMAL HIGH (ref 0.0–100.0)

## 2018-08-01 MED ORDER — SODIUM CHLORIDE 0.9 % IV BOLUS (SEPSIS)
1000.0000 mL | Freq: Once | INTRAVENOUS | Status: AC
  Administered 2018-08-01: 1000 mL via INTRAVENOUS

## 2018-08-01 MED ORDER — MAGNESIUM SULFATE 2 GM/50ML IV SOLN
2.0000 g | INTRAVENOUS | Status: AC
  Administered 2018-08-01: 2 g via INTRAVENOUS
  Filled 2018-08-01: qty 50

## 2018-08-01 MED ORDER — HEPARIN SODIUM (PORCINE) 5000 UNIT/ML IJ SOLN
5000.0000 [IU] | Freq: Three times a day (TID) | INTRAMUSCULAR | Status: DC
  Administered 2018-08-01 – 2018-08-09 (×24): 5000 [IU] via SUBCUTANEOUS
  Filled 2018-08-01 (×24): qty 1

## 2018-08-01 MED ORDER — PROMETHAZINE HCL 25 MG PO TABS
12.5000 mg | ORAL_TABLET | Freq: Four times a day (QID) | ORAL | Status: DC | PRN
  Administered 2018-08-03 – 2018-08-06 (×2): 12.5 mg via ORAL
  Filled 2018-08-01 (×2): qty 1

## 2018-08-01 MED ORDER — TRAZODONE HCL 50 MG PO TABS
50.0000 mg | ORAL_TABLET | Freq: Every day | ORAL | Status: DC
  Administered 2018-08-02 – 2018-08-04 (×3): 50 mg via ORAL
  Filled 2018-08-01 (×4): qty 1

## 2018-08-01 MED ORDER — SODIUM CHLORIDE 0.9 % IV SOLN
2.0000 g | INTRAVENOUS | Status: DC
  Filled 2018-08-01: qty 20

## 2018-08-01 MED ORDER — ALBUTEROL (5 MG/ML) CONTINUOUS INHALATION SOLN
10.0000 mg/h | INHALATION_SOLUTION | Freq: Once | RESPIRATORY_TRACT | Status: AC
  Administered 2018-08-01: 10 mg/h via RESPIRATORY_TRACT
  Filled 2018-08-01: qty 20

## 2018-08-01 MED ORDER — SODIUM CHLORIDE 0.9 % IV SOLN
1.0000 g | INTRAVENOUS | Status: DC
  Administered 2018-08-02: 1 g via INTRAVENOUS
  Filled 2018-08-01: qty 1

## 2018-08-01 MED ORDER — SODIUM CHLORIDE 0.9 % IV SOLN
INTRAVENOUS | Status: AC
  Administered 2018-08-01: 11:00:00 via INTRAVENOUS

## 2018-08-01 MED ORDER — ACETAMINOPHEN 650 MG RE SUPP
650.0000 mg | Freq: Once | RECTAL | Status: AC
  Administered 2018-08-01: 650 mg via RECTAL
  Filled 2018-08-01: qty 1

## 2018-08-01 MED ORDER — SODIUM CHLORIDE 0.9 % IV SOLN
500.0000 mg | INTRAVENOUS | Status: DC
  Filled 2018-08-01: qty 500

## 2018-08-01 MED ORDER — VANCOMYCIN HCL 10 G IV SOLR
1500.0000 mg | Freq: Once | INTRAVENOUS | Status: AC
  Administered 2018-08-01: 1500 mg via INTRAVENOUS
  Filled 2018-08-01: qty 1500

## 2018-08-01 MED ORDER — SENNOSIDES-DOCUSATE SODIUM 8.6-50 MG PO TABS: 1.0000 | ORAL_TABLET | Freq: Every evening | ORAL | Status: DC | PRN

## 2018-08-01 MED ORDER — ACETAMINOPHEN 325 MG PO TABS
650.0000 mg | ORAL_TABLET | Freq: Four times a day (QID) | ORAL | Status: DC | PRN
  Administered 2018-08-02 – 2018-08-09 (×7): 650 mg via ORAL
  Filled 2018-08-01 (×7): qty 2

## 2018-08-01 MED ORDER — SODIUM CHLORIDE 0.9 % IV SOLN
1.0000 g | Freq: Once | INTRAVENOUS | Status: AC
  Administered 2018-08-01: 1 g via INTRAVENOUS
  Filled 2018-08-01: qty 1

## 2018-08-01 MED ORDER — ATORVASTATIN CALCIUM 80 MG PO TABS
80.0000 mg | ORAL_TABLET | Freq: Every day | ORAL | Status: DC
  Administered 2018-08-02 – 2018-08-09 (×8): 80 mg via ORAL
  Filled 2018-08-01 (×9): qty 1

## 2018-08-01 MED ORDER — VANCOMYCIN HCL IN DEXTROSE 1-5 GM/200ML-% IV SOLN
1000.0000 mg | INTRAVENOUS | Status: DC
  Administered 2018-08-03: 1000 mg via INTRAVENOUS
  Filled 2018-08-01: qty 200

## 2018-08-01 MED ORDER — SODIUM CHLORIDE 0.9 % IV BOLUS (SEPSIS)
500.0000 mL | Freq: Once | INTRAVENOUS | Status: AC
  Administered 2018-08-01: 500 mL via INTRAVENOUS

## 2018-08-01 MED ORDER — ACETAMINOPHEN 650 MG RE SUPP: 650.0000 mg | Freq: Four times a day (QID) | RECTAL | Status: DC | PRN

## 2018-08-01 MED ORDER — SERTRALINE HCL 100 MG PO TABS
200.0000 mg | ORAL_TABLET | Freq: Every day | ORAL | Status: DC
  Administered 2018-08-02 – 2018-08-09 (×8): 200 mg via ORAL
  Filled 2018-08-01 (×8): qty 2

## 2018-08-01 MED ORDER — GABAPENTIN 300 MG PO CAPS
300.0000 mg | ORAL_CAPSULE | Freq: Three times a day (TID) | ORAL | Status: DC
  Administered 2018-08-02 – 2018-08-09 (×23): 300 mg via ORAL
  Filled 2018-08-01 (×24): qty 1

## 2018-08-01 NOTE — Progress Notes (Signed)
Patient admitted to 5W 30. Sister at bedside. Patient currently on 4L [place]. Patient unable to answer questions/follow commands. Bed alarm on and audible. Placed on telemetry. IVF continued. Skin assessment completed. Will continue to monitor.

## 2018-08-01 NOTE — ED Triage Notes (Signed)
Patient presents to ed via [facility] from [facility] states patient was altered this am and was ordered to have chest xray and u/a, patient vomited x 1 this am. Ems states they weren't able to detect radial pulses. Patient was diaphoretic , skin cool to touch.

## 2018-08-01 NOTE — ED Provider Notes (Signed)
[facility] EMERGENCY DEPARTMENT Provider Note   CSN: [id number] Arrival date & time: 08/01/18  1191     History   Chief Complaint Chief Complaint  Patient presents with  . Altered Mental Status    HPI Jordan Gardner is a 67 y.o. male.  HPI  67 year old male, with PMH of diabetes, HTN, HLD, MI, bilateral AKA, stroke with residual left-sided weakness, presents from nursing facility with altered mental status and shortness of breath.  Per report by EMS facility presumed pneumonia with recent cough and shortness of breath.  Patient was noted to be tachypneic and hypoxic, he was placed on a nonrebreather.  Patient admits to cough and shortness of breath at this time.  Per EMS report patient also had one episode of vomiting this morning.  Denies any abdominal pain.   Past Medical History:  Diagnosis Date  . Anxiety   . Arthritis   . Asthma   . Coronary artery disease   . Depression   . Diabetes mellitus without complication (HCC)    type 2  . Headache   . Hyperlipidemia   . Hypertension   . Myocardial infarction (HCC)   . Neuromuscular disorder (HCC)   . PAD (peripheral artery disease) (HCC)   . Protein calorie malnutrition (HCC) 04/2017  . Psychotic affective disorder (HCC)    Per sister. Unsure of Dx. Pt sleeps late and difficult to wake. odd affect  . Sacral decubitus ulcer 04/2017  . 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 Above knee amputation of right lower extremity (HCC) 02/03/2018  . Above knee amputation of left lower extremity (HCC) 01/13/2018  . Atherosclerosis of native arteries of extremities with gangrene, left leg (HCC)   . Atherosclerosis of native arteries of extremities with gangrene, right leg (HCC)   . Leukocytosis 12/26/2017  . Essential hypertension 12/26/2017  . Acute hypoxemic respiratory failure (HCC) 07/28/2017  . 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 Critical lower limb ischemia   . 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 AKI (acute kidney injury) (HCC) 04/28/2017  . Malnutrition of moderate degree 04/28/2017  . Pressure injury of skin 04/27/2017  . Severe protein-calorie malnutrition (HCC) 04/27/2017  . Physical deconditioning 04/27/2017  . Claudication (HCC) 04/27/2017  . Sacral decubitus ulcer 04/27/2017  . Accelerated hypertension   . Hyperlipidemia   . Tobacco abuse disorder   . Altered mental status   . PAD (peripheral artery disease) (HCC)   . NSTEMI (non-ST elevated myocardial infarction) (HCC) 04/25/2017  . NECK PAIN 10/16/2008  . BACK PAIN 10/16/2008    Past Surgical History:  Procedure Laterality Date  . ABDOMINAL AORTOGRAM W/LOWER EXTREMITY N/A 06/08/2017   Procedure: ABDOMINAL AORTOGRAM W/LOWER EXTREMITY;  Surgeon: Chuck Hint, MD;  Location: [facility] INVASIVE CV LAB;  Service: Cardiovascular;  Laterality: N/A;  . ABOVE KNEE LEG AMPUTATION Bilateral 01/13/2018  . AMPUTATION Bilateral 01/13/2018   Procedure: BILATERAL ABOVE KNEE AMPUTATION;  Surgeon: Nadara Mustard, MD;  Location: [facility] OR;  Service: Orthopedics;  Laterality: Bilateral;  . BACK SURGERY    . CARDIAC CATHETERIZATION    . CARDIAC SURGERY     CABG x5 2012 - [facility]  . CAROTID-SUBCLAVIAN BYPASS GRAFT Left 07/23/2017   Procedure: BYPASS SUBCLAVIAN ARTERY  USING VIABAHN GORE STENT  GRAFT;  Surgeon: Chuck Hintickson, Christopher S, MD;  [facility];  Service: Vascular;  Laterality: Left;  . LEFT HEART CATH AND CORS/GRAFTS ANGIOGRAPHY N/A 06/26/2017   Procedure: LEFT HEART CATH AND CORS/GRAFTS ANGIOGRAPHY;  Surgeon: Tonny Bollmanooper, Michael, MD;  [facility];  Service: Cardiovascular;  Laterality: N/A;        Home Medications    Prior to Admission medications   Medication Sig Start Date End Date  Taking? Authorizing Provider  aspirin EC 81 MG EC tablet Take 1 tablet (81 mg total) by mouth daily. 04/30/17   Cleora FleetJohnson, Clanford L, MD  aspirin-acetaminophen-caffeine (EXCEDRIN MIGRAINE) [id number] MG tablet Take 2 tablets by mouth every 8 (eight) hours as needed for headache.    [provider]  atorvastatin (LIPITOR) 80 MG tablet Take 80 mg by mouth at bedtime.    [provider]  docusate sodium (COLACE) 100 MG capsule Take 100 mg by mouth daily as needed for mild constipation.    [provider]  feeding supplement, GLUCERNA SHAKE, (GLUCERNA SHAKE) LIQD Take 237 mLs by mouth 2 (two) times daily between meals. 04/30/17   Johnson, Clanford L, MD  furosemide (LASIX) 40 MG tablet Take 1 tablet (40 mg total) by mouth daily. 08/05/17   Hongalgi, Maximino GreenlandAnand D, MD  gabapentin (NEURONTIN) 300 MG capsule Take 1 capsule (300 mg total) by mouth 3 (three) times daily. 02/03/18   Adonis HugueninZamora, Erin R, NP  isosorbide mononitrate (IMDUR) 30 MG 24 hr tablet Take 2 tablets (60 mg total) daily by mouth. 07/01/17   Arty Baumgartneroberts, Lindsay B, NP  lisinopril (PRINIVIL,ZESTRIL) 2.5 MG tablet Take 1 tablet (2.5 mg total) daily by mouth. 07/02/17   Arty Baumgartneroberts, Lindsay B, NP  megestrol (MEGACE) 20 MG tablet Take 20 mg by mouth daily.    [provider]  metFORMIN (GLUCOPHAGE) 500 MG tablet Take 1,000 mg by mouth 2 (two) times daily with a meal.    [provider]  metoprolol tartrate (LOPRESSOR) 50 MG tablet Take 0.5 tablets (25 mg total) by mouth 2 (two) times daily. 01/01/18   Osvaldo ShipperKrishnan, Gokul, MD  mirtazapine (REMERON) 15 MG tablet Take 15 mg by mouth at bedtime.    [provider]  Multiple Vitamin (MULTIVITAMIN WITH MINERALS) TABS tablet Take 1 tablet by mouth daily. 04/30/17   Johnson, Clanford L, MD  nicotine (NICODERM CQ - DOSED IN MG/24 HOURS) 14 mg/24hr patch Place 1 patch (14 mg total) onto the skin daily. 04/30/17   Johnson, Clanford L, MD  nitroGLYCERIN (NITROSTAT) 0.4 MG SL tablet  Place 1 tablet (0.4 mg total) under the tongue every 5 (five) minutes x 3 doses as needed for chest pain. 04/29/17   Johnson, Clanford L, MD  oxyCODONE-acetaminophen (PERCOCET/ROXICET) 5-325 MG tablet Take 1 tablet by mouth every 4 (four) hours as needed for severe pain. 01/16/18   Nadara Mustarduda, Marcus V, MD  sertraline (ZOLOFT) 100 MG tablet Take 200 mg by mouth daily. DEPRESSION/ANXIETY    [provider]    Family History Family History  Problem Relation Age of Onset  . Hypertension Sister     Social History Social History   Tobacco Use  . Smoking status: Current Some Day Smoker    Packs/day: 1.00    Types: Cigarettes  . Smokeless tobacco: Never Used  Substance Use Topics  . Alcohol use: No  . Drug use: No     Allergies   Patient has no known allergies.   Review of Systems Review of Systems  Constitutional:  Positive for fever. Negative for chills.  HENT: Negative for rhinorrhea and sore throat.   Eyes: Negative for visual disturbance.  Respiratory: Positive for cough and shortness of breath.   Cardiovascular: Negative for chest pain and leg swelling.  Gastrointestinal: Positive for vomiting. Negative for abdominal pain, diarrhea and nausea.  Musculoskeletal: Negative for neck pain.  Skin: Negative for rash and wound.  Neurological: Negative for dizziness.  All other systems reviewed and are negative.    Physical Exam Updated Vital Signs BP (!) 143/77 (BP Location: Right Arm)   Pulse (!) 130   Temp (!) 101.5 F (38.6 C) (Rectal)   Resp (!) 26   SpO2 (!) 81%   Physical Exam Vitals signs and nursing note reviewed.  Constitutional:      General: He is awake.  HENT:     Head: Normocephalic and atraumatic.  Eyes:     Conjunctiva/sclera: Conjunctivae normal.  Neck:     Musculoskeletal: Neck supple.  Cardiovascular:     Rate and Rhythm: Regular rhythm. Tachycardia present.     Heart sounds: Normal heart sounds. No murmur.  Pulmonary:     Effort: Tachypnea,  accessory muscle usage and respiratory distress present.     Breath sounds: Examination of the right-upper field reveals decreased breath sounds. Examination of the left-upper field reveals decreased breath sounds. Examination of the right-lower field reveals wheezing and rales. Examination of the left-lower field reveals wheezing and rales. Decreased breath sounds, wheezing and rales present.  Chest:     Comments: Sternotomy scar Abdominal:     General: Bowel sounds are normal. There is no distension.     Palpations: Abdomen is soft.     Tenderness: There is no abdominal tenderness.  Musculoskeletal: Normal range of motion.        General: No tenderness or deformity.     Comments: BL AKA   Feet:     Right foot:     Amputation: Right leg is amputated above knee.     Left foot:     Amputation: Left leg is amputated above knee.  Skin:    General: Skin is warm and dry.     Findings: No erythema or rash.  Neurological:     Mental Status: He is alert and oriented to person, place, and time.  Psychiatric:        Behavior: Behavior normal.      ED Treatments / Results  Labs (all labs ordered are listed, but only abnormal results are displayed) Labs Reviewed  I-STAT CG4 LACTIC ACID, ED - Abnormal; Notable for the following components:      Result Value   Lactic Acid, Venous 4.77 (*)    All other components within normal limits  CULTURE, BLOOD (ROUTINE X 2)  CULTURE, BLOOD (ROUTINE X 2)  URINE CULTURE  COMPREHENSIVE METABOLIC PANEL  CBC WITH DIFFERENTIAL/PLATELET  URINALYSIS, ROUTINE W REFLEX MICROSCOPIC  BRAIN NATRIURETIC PEPTIDE  I-STAT TROPONIN, ED    EKG EKG Interpretation  Date/Time:  Sunday August 01 2018 06:45:19 EST Ventricular Rate:  133 PR Interval:    QRS Duration: 143 QT Interval:  386 QTC Calculation: 575 R Axis:   164 Text Interpretation:  Ectopic atrial tachycardia, unifocal Nonspecific intraventricular conduction delay Minimal ST depression, anterior  leads Baseline wander in lead(s) I II aVR V1 V2 V3 Since last tracing QT has lengthened Abnormal ekg Confirmed by Eber Hong ([id number]) on 08/01/2018 7:06:19 AM   Radiology No results found.  Procedures .Critical Care Performed by:  Cameryn Chrisley S, PA-C Authorized by: Clayborne Artist, PA-C   Critical care provider statement:    Critical care time (minutes):  45   Critical care was necessary to treat or prevent imminent or life-threatening deterioration of the following conditions:  Respiratory failure, sepsis and dehydration   Critical care was time spent personally by me on the following activities:  Discussions with consultants, evaluation of patient's response to treatment, examination of patient, ordering and performing treatments and interventions, ordering and review of laboratory studies, ordering and review of radiographic studies, pulse oximetry, re-evaluation of patient's condition, obtaining history from patient or surrogate and review of old charts   (including critical care time)  Medications Ordered in ED Medications  sodium chloride 0.9 % bolus 1,000 mL (1,000 mLs Intravenous New Bag/Given 08/01/18 0708)    And  sodium chloride 0.9 % bolus 1,000 mL (1,000 mLs Intravenous New Bag/Given 08/01/18 0710)    And  sodium chloride 0.9 % bolus 500 mL (has no administration in time range)  albuterol (PROVENTIL,VENTOLIN) solution continuous neb (has no administration in time range)  acetaminophen (TYLENOL) suppository 650 mg (has no administration in time range)  ceFEPIme (MAXIPIME) 1 g in sodium chloride 0.9 % 100 mL IVPB (has no administration in time range)  vancomycin (VANCOCIN) 1,500 mg in sodium chloride 0.9 % 500 mL IVPB (has no administration in time range)  magnesium sulfate IVPB 2 g 50 mL (has no administration in time range)     Initial Impression / Assessment and Plan / ED Course  I have reviewed the triage vital signs and the nursing notes.  Pertinent labs  & imaging results that were available during my care of the patient were reviewed by me and considered in my medical decision making (see chart for details).  Clinical Course as of Aug 01 828  Sun Aug 01, 2018  0706 EKG today shows significant prolongation of the QT interval, measuring 575, magnesium will be given   [BM]  0756 Patient resting comfortably on BiPAP.  He remains tachycardic.  However his O2 saturations are 98% on BiPAP.  He has less accessory muscle use.  Lungs are evaluated and no wheezing heard.  He continues to have rales in bilateral bases.  Patient continues to deny any abdominal pain, chest pain.  His abdomen is soft and nontender to palpation.  Waiting on urine, CMP.   [CK]    Clinical Course User Index [BM] Eber Hong, MD [CK] Clayborne Artist, PA-C    Patient presented today from nursing facility with hypoxia, tachycardia and febrile.  Per report patient was altered however patient is alert and oriented x4.  He admits to shortness of breath and coughing.  He was noted to be hypoxic at 89% on a nonrebreather.  He was started on a BiPAP. History and physical most consistent with sepsis.  His chest x-ray shows bilateral lower lobe pneumonias this is likely the cause of his sepsis.  His lactic acid and white blood cell count are elevated.  He has evidence of AKI.  He was given fluids, Tylenol, nebs, broad-spectrum antibiotics to cover HCAP, magnesium for prolonged QT.  Patient has had multiple re-evaluations.  He is resting comfortably on BiPAP.  He remains tachycardic and slightly tachypneic.  He was noted to have hyperkalemia.  Will repeat BMP. Discussed case with attending, Dr. Hyacinth Meeker who agrees with plan.  Sepsis - Repeat Assessment  Performed at:    0800   Vitals     Blood  pressure 133/62, pulse (!) 129, temperature (!) 101.5 F (38.6 C), temperature source Rectal, resp. rate (!) 27, SpO2 97 %.  Heart:     Tachycardic  Lungs:    Rales  Capillary Refill:   <2  sec  Peripheral Pulse:   Radial pulse palpable  Skin:     Dry    Final Clinical Impressions(s) / ED Diagnoses   Final diagnoses:  None    ED Discharge Orders    None       Rueben Bash 08/01/18 1403    Eber Hong, MD 08/02/18 Jerene Bears

## 2018-08-01 NOTE — Progress Notes (Signed)
Pt placed on Bipap at this time per MD order due to increased WOB. Pt tolerating well.  Continuous albuterol tx given through Bipap. RT will monitor

## 2018-08-01 NOTE — Progress Notes (Signed)
   08/01/18 1543  Urine Characteristics  Urinary Interventions Bladder scan  Bladder Scan Volume (mL) 451 mL   Patient unable to void to obtain urine culture. Dr. Nedra HaiLee notified.

## 2018-08-01 NOTE — Progress Notes (Signed)
CRITICAL VALUE ALERT  Critical Value:  Lactic acid 4.6  Date & Time Notied:  12/15 2:15pm   Provider Notified: Dr. Nedra HaiLee   Orders Received/Actions taken: orders to increase MIVF

## 2018-08-01 NOTE — Progress Notes (Signed)
Pt is NPO page the on call physician from teaching service called back on phone with verbal order to give PO meds, I  tried pt with water first could not swallow it down kept it in the mouth oral med not given, med unable to return to pyxis since it was already open will continue to monitor

## 2018-08-01 NOTE — Progress Notes (Signed)
Pharmacy Antibiotic Note  Jordan Gardner is a 67 y.o. male admitted on 08/01/2018 with sepsis.  Pharmacy has been consulted for Vancomycin / Cefepime dosing.  Received doses in the ED  Plan: Cefepime 1 gram iv Q 24 hours Vancomycin 1000 mg iv Q 48 hours Follow up cultures, LOT  Weight: 144 lb 2.9 oz (65.4 kg)  Temp (24hrs), Avg:100.8 F (38.2 C), Min:99.5 F (37.5 C), Max:101.5 F (38.6 C)  Recent Labs  Lab 08/01/18 0640 08/01/18 0658 08/01/18 0837 08/01/18 0841 08/01/18 1320 08/01/18 1635  WBC 20.9*  --   --   --   --   --   CREATININE 3.17*  --  2.99*  --   --   --   LATICACIDVEN  --  4.77*  --  4.43* 4.6* 3.4*    Estimated Creatinine Clearance: 22.5 mL/min (A) (by C-G formula based on SCr of 2.99 mg/dL (H)).    No Known Allergies   Thank you for allowing pharmacy to be a part of this patient's care. Okey RegalLisa Farrell Broerman, PharmD 08/01/2018 5:33 PM

## 2018-08-01 NOTE — Progress Notes (Signed)
Pt taken off Bipap at this time tolerating well. Placed on 2L [place], no distress noted at this time

## 2018-08-01 NOTE — Progress Notes (Signed)
CRITICAL VALUE ALERT  Critical Value:  Lactic 3.4  Date & Time Notied:  12/15 5:20p  Provider Notified: Dr. Nedra HaiLee  Orders Received/Actions taken: No orders received

## 2018-08-01 NOTE — H&P (Signed)
Date: 08/01/2018               Patient Name:  Jordan Gardner Pundt MRN: [id number]  DOB: 05/06/1951 Age / Sex: 67 y.o., male   [facility]         Medical Service: Internal Medicine Teaching Service         Attending Physician: Dr. Eber HongMiller, Brian, MD    First Contact: Dr. Judeth CornfieldLee, Joshua, MD Pager: [phone]  Second Contact: Dr. Lorenso Courierhundi, Vahini, MD Pager: [phone]       After Hours (After 5p/  First Contact Pager: [phone]  weekends / holidays): Second Contact Pager: [phone]   Chief Complaint: Respiratory Distress  History of Present Illness:  Mr.Kinser is a 67 year old M with PMH of type 2 diabetes, HLD, HTN, CVA w/ residual left-sided weakness, PVD, CAD status post CABG (2012), and NSTEMI (04/2017) presenting with respiratory distress.  He was examined and evaluated at bedside with family present.  He is AAO x3 but unable to recall events leading up to this morning.  He states that the last thing he remembers is getting out of his chair last night and his next memory is being in the hospital. He remembers having fever/nausea/vomiting/diarrhea last night without any obvious inciting events.  Currently not endorsing cough or productive sputum.  He had an admission in 6/2 0194 gangrenous foot and AKA and [facility].  Speaking with the nurse at [facility], the night nurse was doing neuro checks as he had fallen 2 days ago and found him to be altered ('I need to go home.') and lethargic with cough, endorsing chills and chest congestion. His vitals were stable w/ Temp 96.4, BP 145/80, HR 88, Resp 19, O2sat 95 on RA but started to have nausea and NBNB vomiting and became unresponsive so he was brought to the ED. His day nurse noticed no significant change yesterday but raises concerns about Mr.Bottari preferring to stay supine during medication with concerns for possible aspiration events but 05/19/18 he was evaluated by speech and  swallow evaluation who noted normal swallowing function.  In the ED he was found to be febrile, tachycardic, tachypneic with new bilateral lower lobe consolidations on chest x-ray.  He was found to have elevated lactate and was given 2.5 L NS bolus.  His blood cultures were collected and he was started on Vanc and Cefepime with presumed diagnosis of HAP.  Also found to be hyperkalemic but K downtrended after given fluids. He was also given 2g Mag sulfate for possible wide QRS. His oxygenation dropped to 81 and he was put on Bipap. Internal medicine was consulted for admission for sepsis.  Meds:  Current Meds  Medication Sig  . ALPRAZolam (XANAX) 0.25 MG tablet Take 0.25 mg by mouth at bedtime.  . Amino Acids-Protein Hydrolys (FEEDING SUPPLEMENT, PRO-STAT SUGAR FREE 64,) LIQD Take 30 mLs by mouth 2 (two) times daily.  Marland Kitchen. aspirin EC 81 MG EC tablet Take 1 tablet (81 mg total) by mouth daily.  Marland Kitchen. aspirin-acetaminophen-caffeine (EXCEDRIN MIGRAINE) 250-250-65 MG tablet Take 2 tablets by mouth every 8 (eight) hours as needed for headache.  Marland Kitchen. atorvastatin (LIPITOR) 80 MG tablet Take 80 mg by mouth daily at 6 PM.   . benzocaine-menthol (CHLORASEPTIC SORE THROAT) 6-10 MG lozenge Take 1 lozenge by mouth every 2 (two) hours as needed for sore throat.  . docusate sodium (COLACE) 100 MG capsule Take 100 mg by mouth daily as needed for  mild constipation.  . furosemide (LASIX) 20 MG tablet Take 20 mg by mouth daily.  Marland Kitchen gabapentin (NEURONTIN) 300 MG capsule Take 1 capsule (300 mg total) by mouth 3 (three) times daily.  . isosorbide mononitrate (IMDUR) 60 MG 24 hr tablet Take 60 mg by mouth daily.  Marland Kitchen lisinopril (PRINIVIL,ZESTRIL) 2.5 MG tablet Take 1 tablet (2.5 mg total) daily by mouth.  . loperamide (IMODIUM A-D) 2 MG tablet Take 2 mg by mouth as needed for diarrhea or loose stools.  . megestrol (MEGACE) 20 MG tablet Take 20 mg by mouth daily.  . metFORMIN (GLUCOPHAGE) 1000 MG tablet Take 1,000 mg by mouth 2  (two) times daily with a meal.  . metoprolol tartrate (LOPRESSOR) 50 MG tablet Take 0.5 tablets (25 mg total) by mouth 2 (two) times daily.  . mirtazapine (REMERON) 30 MG tablet Take 30 mg by mouth at bedtime.  . Multiple Vitamin (MULTIVITAMIN WITH MINERALS) TABS tablet Take 1 tablet by mouth daily.  . nitroGLYCERIN (NITROSTAT) 0.4 MG SL tablet Place 1 tablet (0.4 mg total) under the tongue every 5 (five) minutes x 3 doses as needed for chest pain.  Marland Kitchen ondansetron (ZOFRAN) 4 MG tablet Take 4 mg by mouth every 6 (six) hours as needed for nausea or vomiting.  Marland Kitchen oxyCODONE-acetaminophen (PERCOCET/ROXICET) 5-325 MG tablet Take 1 tablet by mouth every 4 (four) hours as needed for severe pain. (Patient taking differently: Take 1-2 tablets by mouth every 4 (four) hours as needed for severe pain. )  . sertraline (ZOLOFT) 100 MG tablet Take 200 mg by mouth daily. DEPRESSION/ANXIETY  . traZODone (DESYREL) 50 MG tablet Take 50 mg by mouth at bedtime.  Marland Kitchen UNABLE TO FIND Take 120 mLs by mouth 3 (three) times daily. Med Name: MedPass 2.0  . Vitamin D, Ergocalciferol, (DRISDOL) 1.25 MG (50000 UT) CAPS capsule Take 50,000 Units by mouth every Friday.   Allergies: Allergies as of 08/01/2018  . (No Known Allergies)   Past Medical History:  Diagnosis Date  . Anxiety   . Arthritis   . Asthma   . Coronary artery disease   . Depression   . Diabetes mellitus without complication (HCC)    type 2  . Headache   . Hyperlipidemia   . Hypertension   . Myocardial infarction (HCC)   . Neuromuscular disorder (HCC)   . PAD (peripheral artery disease) (HCC)   . Protein calorie malnutrition (HCC) 04/2017  . Psychotic affective disorder (HCC)    Per sister. Unsure of Dx. Pt sleeps late and difficult to wake. odd affect  . Sacral decubitus ulcer 04/2017  . Stroke [facility])    left side weakness   Family History:  Grandmother had type 2 diabetes  Family History  Problem Relation Age of Onset  . Hypertension Sister     Social History:  Former smoker 1pack per week. Former drinker but has not had drink in last 3 months. Denies any illicit substance use. Sister is closest family member that provides care.  Social History   Tobacco Use  . Smoking status: Current Some Day Smoker    Packs/day: 1.00    Types: Cigarettes  . Smokeless tobacco: Never Used  Substance Use Topics  . Alcohol use: No  . Drug use: No   Review of Systems: A complete ROS was negative except as per HPI.   Physical Exam: Blood pressure 133/62, pulse (!) 129, temperature (!) 101.5 F (38.6 C), temperature source Rectal, resp. rate (!) 27, SpO2 97 %. Physical  Exam  Constitutional: He is oriented to person, place, and time. No distress.  Chronically ill-appearing  HENT:  Head: Normocephalic and atraumatic.  Mouth/Throat: Oropharynx is clear and moist. No oropharyngeal exudate.  Eyes: Pupils are equal, round, and reactive to light. Conjunctivae and EOM are normal. No scleral icterus.  Neck: Normal range of motion. Neck supple. No JVD present.  Cardiovascular: Normal rate, regular rhythm, normal heart sounds and intact distal pulses.  No murmur heard. Pulmonary/Chest: Effort normal. He has no wheezes. He has no rales.  Currently on bipap  Abdominal: Soft. Bowel sounds are normal. There is abdominal tenderness (epigastric tenderness to deep palpation).  Musculoskeletal: Normal range of motion.        General: Deformity (bilateral AKA) present. No edema.  Lymphadenopathy:    He has no cervical adenopathy.  Neurological: He is alert and oriented to person, place, and time. No cranial nerve deficit. He exhibits abnormal muscle tone (Contracted upper extremities, slow to relax when verbally instructed). GCS score is 15.  Tremulous  Skin: Skin is warm and dry. He is not diaphoretic.  Psychiatric: Mood, memory, affect and judgment normal.   EKG: personally reviewed my interpretation is tachycardia, right axis deviation, known  fascicular bundle branch blocks, no obvious ST elevation or depression.  CXR: personally reviewed my interpretation is rotated, sternal wires noted, enlarged mediastinum, elevated left diaphragm, bilateral fluffy infiltrates on right and left lower lobe, no pleural effusion, no lobar consolidation.  Assessment & Plan by Problem: Active Problems:   * No active hospital problems. *  Mr. Nesmith is a 67 year old M with PMH of T2DM, HTN, HLD, PVD S/P bilateral AKA, CAD S/P CABG, CVA W left-sided weakness, HFrEF, and NSTEMI admitted for sepsis.  His altered mental status described by nurse at SNF appeared to have resolved by the time of my evaluation. He has 2/3 qSOFA for sepsis. At this point most likely source is his chest x-ray findings.  However will need to work-up for other possible sources such as urine. Currently on empiric wide coverage for HAP w/ vanc and cefepime but will narrow pending blood culture results.   Sepsis 2/2 pneumonia qSOFA 2/3 w/ AMS & Tachypnea. On Admit temp 101.43F, BP 143/77, HR 130, RR 26, O2 sat 81. Currently 99 on Bipap. Wbc 20.9 Lactate 4.77 - F/u blood cultures, ua, urine cultures - Trend CBC - C/w vanc, cefepime - Can narrow abx once cultures result - Trend lactate - Keep O2 sat >88  HRrEF Last Echo 06/2017 show EF 50%, grade 1 diastolic dysfunction - Gentle hydration w/ 75cc/hr - Holding metorpolol and lasix for now  AKI 2/2 dehydration Admit creatine 3.17, Baseline 1-1.3, Cr down trending to 2.99 after 2L NS in ED, K 6.1->5.5 - Trend BMP - Maintenance fluid at 75cc / hr (Last Echo 06/2017 show EF 50%)  Depression/Anxiety On trazodone 50mg  qhs, zoloft 200mg  daily, Xanax 0.25mg  qhs - Holding home psych meds for now for ams  T2DM On metformin and gabapentin at home.  No hgb a1c in last 3 months - SSI - Glucose checks  HTN Admit bp 143/77, On metoprolol tartrate 25mg  BID, lisinopril 2.5mg  daily, Imdur 60mg  daily - Holding home bp meds for now for  fluctating bp  DVT prophx: subqheparin Diet: NPO while bipap, Cardiac/Diabetic Code: DNR, DNI  Dispo: Admit patient to Inpatient with expected length of stay greater than 2 midnights.  SignedLorenso Courier, MD 08/01/2018, 8:59 AM  Pager: Pager: [phone]

## 2018-08-01 NOTE — ED Provider Notes (Signed)
Medical screening examination/treatment/procedure(s) were conducted as a shared visit with non-physician practitioner(s) and myself.  I personally evaluated the patient during the encounter.  Clinical Impression:   Final diagnoses:  Sepsis with acute respiratory failure without septic shock, due to unspecified organism, unspecified whether hypoxia or hypercapnia present (HCC)  Pneumonia of both lower lobes due to infectious organism (HCC)  AKI (acute kidney injury) [facility](HCC)    Ill-appearing 67 year old male comes from nursing facility, likely diagnosis of pneumonia, fever to 101.4 tachycardic to 130 hypoxic to 81% on supplemental oxygen and on a nonrebreather up to 88 to 89%.  He is tachypneic and in respiratory distress.  He has a history of bilateral above-the-knee amputations and has been in a nursing facility since amputation.  Need sepsis work-up, critically ill, DO NOT RESUSCITATE  We will start BiPAP, continuous nebulizers for ongoing wheezing, presumed antibiotics for pneumonia  The pt has a very long QTc - 575, much longer than baseline Has potential for decompensation - magnesium ordered - on monitor.  Pt is critically ill.  CRITICAL CARE Performed by: Vida RollerBrian D Susen Haskew Total critical care time: 35 minutes Critical care time was exclusive of separately billable procedures and treating other patients. Critical care was necessary to treat or prevent imminent or life-threatening deterioration. Critical care was time spent personally by me on the following activities: development of treatment plan with patient and/or surrogate as well as nursing, discussions with consultants, evaluation of patient's response to treatment, examination of patient, obtaining history from patient or surrogate, ordering and performing treatments and interventions, ordering and review of laboratory studies, ordering and review of radiographic studies, pulse oximetry and re-evaluation of patient's condition.     Eber HongMiller, Kayana Thoen, MD 08/01/18 [phone]

## 2018-08-02 ENCOUNTER — Inpatient Hospital Stay (HOSPITAL_COMMUNITY): Payer: Medicare HMO

## 2018-08-02 DIAGNOSIS — D649 Anemia, unspecified: Secondary | ICD-10-CM

## 2018-08-02 DIAGNOSIS — A419 Sepsis, unspecified organism: Principal | ICD-10-CM

## 2018-08-02 DIAGNOSIS — I110 Hypertensive heart disease with heart failure: Secondary | ICD-10-CM

## 2018-08-02 DIAGNOSIS — F329 Major depressive disorder, single episode, unspecified: Secondary | ICD-10-CM

## 2018-08-02 DIAGNOSIS — Z87891 Personal history of nicotine dependence: Secondary | ICD-10-CM

## 2018-08-02 DIAGNOSIS — I5020 Unspecified systolic (congestive) heart failure: Secondary | ICD-10-CM

## 2018-08-02 DIAGNOSIS — E1151 Type 2 diabetes mellitus with diabetic peripheral angiopathy without gangrene: Secondary | ICD-10-CM

## 2018-08-02 DIAGNOSIS — I2510 Atherosclerotic heart disease of native coronary artery without angina pectoris: Secondary | ICD-10-CM

## 2018-08-02 DIAGNOSIS — E860 Dehydration: Secondary | ICD-10-CM

## 2018-08-02 DIAGNOSIS — Z89611 Acquired absence of right leg above knee: Secondary | ICD-10-CM

## 2018-08-02 DIAGNOSIS — J189 Pneumonia, unspecified organism: Secondary | ICD-10-CM

## 2018-08-02 DIAGNOSIS — E785 Hyperlipidemia, unspecified: Secondary | ICD-10-CM

## 2018-08-02 DIAGNOSIS — J181 Lobar pneumonia, unspecified organism: Secondary | ICD-10-CM

## 2018-08-02 DIAGNOSIS — F0390 Unspecified dementia without behavioral disturbance: Secondary | ICD-10-CM

## 2018-08-02 DIAGNOSIS — Z89612 Acquired absence of left leg above knee: Secondary | ICD-10-CM

## 2018-08-02 DIAGNOSIS — Z66 Do not resuscitate: Secondary | ICD-10-CM

## 2018-08-02 DIAGNOSIS — Z79899 Other long term (current) drug therapy: Secondary | ICD-10-CM

## 2018-08-02 DIAGNOSIS — R1310 Dysphagia, unspecified: Secondary | ICD-10-CM

## 2018-08-02 DIAGNOSIS — I252 Old myocardial infarction: Secondary | ICD-10-CM

## 2018-08-02 DIAGNOSIS — N179 Acute kidney failure, unspecified: Secondary | ICD-10-CM

## 2018-08-02 DIAGNOSIS — F419 Anxiety disorder, unspecified: Secondary | ICD-10-CM

## 2018-08-02 DIAGNOSIS — Z951 Presence of aortocoronary bypass graft: Secondary | ICD-10-CM

## 2018-08-02 DIAGNOSIS — Z8673 Personal history of transient ischemic attack (TIA), and cerebral infarction without residual deficits: Secondary | ICD-10-CM

## 2018-08-02 LAB — BASIC METABOLIC PANEL
Anion gap: 12 (ref 5–15)
BUN: 109 mg/dL — ABNORMAL HIGH (ref 8–23)
CHLORIDE: 119 mmol/L — AB (ref 98–111)
CO2: 14 mmol/L — ABNORMAL LOW (ref 22–32)
CREATININE: 2.83 mg/dL — AB (ref 0.61–1.24)
Calcium: 9.1 mg/dL (ref 8.9–10.3)
GFR calc Af Amer: 26 mL/min — ABNORMAL LOW (ref >60)
GFR calc non Af Amer: 22 mL/min — ABNORMAL LOW (ref >60)
Glucose, Bld: 186 mg/dL — ABNORMAL HIGH (ref 70–99)
Potassium: 5.6 mmol/L — ABNORMAL HIGH (ref 3.5–5.1)
Sodium: 145 mmol/L (ref 135–145)

## 2018-08-02 LAB — CBC
HCT: 31.2 % — ABNORMAL LOW (ref 39.0–52.0)
Hemoglobin: 9.5 g/dL — ABNORMAL LOW (ref 13.0–17.0)
MCH: 26.7 pg (ref 26.0–34.0)
MCHC: 30.4 g/dL (ref 30.0–36.0)
MCV: 87.6 fL (ref 80.0–100.0)
Platelets: 124 10*3/uL — ABNORMAL LOW (ref 150–400)
RBC: 3.56 MIL/uL — ABNORMAL LOW (ref 4.22–5.81)
RDW: 14.7 % (ref 11.5–15.5)
WBC: 14.3 10*3/uL — ABNORMAL HIGH (ref 4.0–10.5)
nRBC: 0 % (ref 0.0–0.2)

## 2018-08-02 LAB — MAGNESIUM: Magnesium: 2.2 mg/dL (ref 1.7–2.4)

## 2018-08-02 LAB — STREP PNEUMONIAE URINARY ANTIGEN: Strep Pneumo Urinary Antigen: NEGATIVE

## 2018-08-02 LAB — URINE CULTURE: Culture: NO GROWTH

## 2018-08-02 LAB — POTASSIUM: Potassium: 5.3 mmol/L — ABNORMAL HIGH (ref 3.5–5.1)

## 2018-08-02 LAB — PHOSPHORUS: Phosphorus: 3.5 mg/dL (ref 2.5–4.6)

## 2018-08-02 LAB — HIV ANTIBODY (ROUTINE TESTING W REFLEX): HIV Screen 4th Generation wRfx: NONREACTIVE

## 2018-08-02 LAB — MRSA PCR SCREENING: MRSA by PCR: POSITIVE — AB

## 2018-08-02 MED ORDER — NICOTINE 14 MG/24HR TD PT24
14.0000 mg | MEDICATED_PATCH | Freq: Every day | TRANSDERMAL | Status: DC
  Administered 2018-08-02 – 2018-08-09 (×8): 14 mg via TRANSDERMAL
  Filled 2018-08-02 (×8): qty 1

## 2018-08-02 MED ORDER — ADULT MULTIVITAMIN W/MINERALS CH
1.0000 | ORAL_TABLET | Freq: Every day | ORAL | Status: DC
  Administered 2018-08-02 – 2018-08-09 (×8): 1 via ORAL
  Filled 2018-08-02 (×8): qty 1

## 2018-08-02 MED ORDER — GLUCERNA SHAKE PO LIQD
237.0000 mL | Freq: Three times a day (TID) | ORAL | Status: DC
  Administered 2018-08-02 – 2018-08-09 (×8): 237 mL via ORAL
  Filled 2018-08-02 (×2): qty 237

## 2018-08-02 MED ORDER — METOPROLOL TARTRATE 25 MG PO TABS
25.0000 mg | ORAL_TABLET | Freq: Two times a day (BID) | ORAL | Status: DC
  Administered 2018-08-02 – 2018-08-09 (×14): 25 mg via ORAL
  Filled 2018-08-02 (×14): qty 1

## 2018-08-02 MED ORDER — SODIUM CHLORIDE 0.9 % IV SOLN
INTRAVENOUS | Status: DC
  Administered 2018-08-02: 08:00:00 via INTRAVENOUS

## 2018-08-02 MED ORDER — SODIUM CHLORIDE 0.9 % IV SOLN
1.0000 g | INTRAVENOUS | Status: DC
  Administered 2018-08-02 – 2018-08-08 (×7): 1 g via INTRAVENOUS
  Filled 2018-08-02 (×7): qty 10

## 2018-08-02 MED ORDER — IPRATROPIUM-ALBUTEROL 0.5-2.5 (3) MG/3ML IN SOLN
3.0000 mL | Freq: Four times a day (QID) | RESPIRATORY_TRACT | Status: DC
  Administered 2018-08-02 – 2018-08-04 (×8): 3 mL via RESPIRATORY_TRACT
  Filled 2018-08-02 (×9): qty 3

## 2018-08-02 MED ORDER — WHITE PETROLATUM EX OINT
TOPICAL_OINTMENT | CUTANEOUS | Status: AC
  Administered 2018-08-02: 22:00:00
  Filled 2018-08-02: qty 28.35

## 2018-08-02 MED ORDER — MUPIROCIN 2 % EX OINT
1.0000 "application " | TOPICAL_OINTMENT | Freq: Two times a day (BID) | CUTANEOUS | Status: AC
  Administered 2018-08-02 – 2018-08-06 (×10): 1 via NASAL
  Filled 2018-08-02 (×3): qty 22

## 2018-08-02 MED ORDER — CHLORHEXIDINE GLUCONATE CLOTH 2 % EX PADS
6.0000 | MEDICATED_PAD | Freq: Every day | CUTANEOUS | Status: AC
  Administered 2018-08-02 – 2018-08-06 (×5): 6 via TOPICAL

## 2018-08-02 MED ORDER — METRONIDAZOLE IN NACL 5-0.79 MG/ML-% IV SOLN
500.0000 mg | Freq: Three times a day (TID) | INTRAVENOUS | Status: DC
  Administered 2018-08-02 – 2018-08-03 (×3): 500 mg via INTRAVENOUS
  Filled 2018-08-02 (×3): qty 100

## 2018-08-02 NOTE — Progress Notes (Addendum)
Modified Barium Swallow Progress Note  Patient Details  Name: Jordan Gardner MRN: [id number] Date of Birth: 05/31/1951  Today's Date: 08/02/2018  Modified Barium Swallow completed.  Full report located under Chart Review in the Imaging Section.  Brief recommendations include the following:  Clinical Impression    Patient has mild-moderate oropharyngeal dysphagia. Evidence of weak lingual manipulation with all consistencies, incomplete tongue to palate contact and reduced posterior propulsion. Delayed swallow initiation with thin liquids at the pyriform sinuses and purees and solids at the valleculae. Once incidence of trace penetration/aspiration with thin liquids by straw before the swallow. Patient unable to follow commands to perform compensatory strategies. Recommend Dys 1 diet with thin liquids, no straw. Medication crushed in puree.  Speech therapy to monitor closely for diet tolerance.    Swallow Evaluation Recommendations       SLP Diet Recommendations: Dysphagia 2 (Fine chop) solids; thin  liquid   Liquid Administration via: No straw;Cup   Medication Administration: Whole meds with puree   Supervision: Staff to assist with self feeding   Compensations: Slow rate;Small sips/bites;Follow solids with liquid   Postural Changes: Remain semi-upright after after feeds/meals (Comment);Seated upright at 90 degrees   Oral Care Recommendations: Oral care QID        Lindalou HoseSarah J. Kennen Stammer, MA, CCC-SLP 08/02/2018 11:25 AM

## 2018-08-02 NOTE — Plan of Care (Signed)

## 2018-08-02 NOTE — Progress Notes (Signed)
Initial Nutrition Assessment  DOCUMENTATION CODES:   Severe malnutrition in context of chronic illness  INTERVENTION:   - Add Glucerna TID given poor PO intake (each provides 220 kcal and 10 g protein) - Add MVI with minerals   NUTRITION DIAGNOSIS:   Severe Malnutrition related to chronic illness as evidenced by percent weight loss, severe fat depletion, severe muscle depletion.   GOAL:   Patient will meet greater than or equal to 90% of their needs   MONITOR:   PO intake, Supplement acceptance, Diet advancement, Weight trends, Labs, I & O's  REASON FOR ASSESSMENT:   Malnutrition Screening Tool    ASSESSMENT:   67 yo male, admitted with sepsis d/t pneumonia. PMH significant for T2DM, HLD, HTN, CVA with residual L-side weakness, PVD, CAD s/p CABG (2012) and NSTEMI (04/2017), previous dx of PCM. 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 Resides in nursing facility.  Labs: potassium 5.6 (H), chloride 119, glucose 186, BUN 109, Creatinine 2.83, GFR 22%/26%, Hgb 9.5, Hct 31.2%, positive for MRSA Meds: Lipitor 80 mg daily, NS infusion at 75 mL/hr  Pt resting in bed, sister present at time of visit.  Pt reports good appetite, but not enjoying the food in the hospital.   Pt states UBW 170# (77.3 kg) about 6 months ago. Per chart, pt weight down 13.5 kg since Aug 2019 --> 17% weight loss in 5 months is clinically significant. Given BL AKA in June 2019, ?accuracy of August weight. Pt attributes wt loss to poor appetite and feeling too tired to eat.  Pt endorses some nausea and diarrhea.   Encouraged pt to include protein-rich foods with all meals and snacks, and to eat those foods first if not feeling hungry. Brought pt an Ensure today - will order Glucerna shakes TID in any flavor.  NUTRITION - FOCUSED PHYSICAL EXAM:   Most Recent Value  Orbital Region  Moderate depletion  Upper Arm Region  Severe depletion  Thoracic and Lumbar Region  Severe depletion  Buccal Region  Severe depletion  Temple Region   Severe depletion  Clavicle Bone Region  Moderate depletion  Scapular Bone Region  Moderate depletion  Dorsal Hand  Severe depletion  Patellar Region  Unable to assess  Anterior Thigh Region  Severe depletion  Posterior Calf Region  Unable to assess  Edema (RD Assessment)  Unable to assess  Hair  Other (Comment) thinning, per pt  Eyes  Reviewed  Mouth  Reviewed  Skin  Reviewed  Nails  Other (Comment) pale      Diet Order:  0% breakfast today, per nsg documentation Diet Order            DIET DYS 2 Room service appropriate? Yes; Fluid consistency: Thin  Diet effective now              EDUCATION NEEDS:  No education needs have been identified at this time  Skin:  Skin Assessment: Skin Integrity Issues: Skin Integrity Issues:: Stage II, Unstageable Stage II: coccyx Unstageable: pressure injury buttocks  Last BM:  PTA  Height:  Ht Readings from Last 1 Encounters:  03/23/18 6\' 1"  (1.854 m)    Weight:  Wt Readings from Last 1 Encounters:  08/01/18 65.4 kg    Ideal Body Weight:  69.1 kg(BL AKA)  BMI:  Body mass index is 19.02 kg/m.  Estimated Nutritional Needs:   Kcal:  2073-2289 calories daily (30-35 kcal/kg IBW)  Protein:  104-118 gm daily (1.5-1.8 g/kg ABW)  Fluid:  >/= 2 L daily or per  MD discretion  Jolaine Artist, MS, RDN, LDN Pager: [phone]

## 2018-08-02 NOTE — Progress Notes (Signed)
Pt sustained HR >160 bpm with 6 beat run of VTACH with complaints of SOB. Nedra HaiLee, MD notified. EKG, Metoprolol, CXR ordered. Dr. Nedra HaiLee came to bedside to assess patient.

## 2018-08-02 NOTE — Evaluation (Signed)
Clinical/Bedside Swallow Evaluation Patient Details  Name: Jordan Gardner Jordan Gardner MRN: [id number] Date of Birth: 12/04/1950  Today's Date: 08/02/2018 Time: SLP Start Time (ACUTE ONLY): 0900 SLP Stop Time (ACUTE ONLY): 0930 SLP Time Calculation (min) (ACUTE ONLY): 30 min  Past Medical History:  Past Medical History:  Diagnosis Date  . Anxiety   . Arthritis   . Asthma   . Coronary artery disease   . Depression   . Diabetes mellitus without complication (HCC)    type 2  . Headache   . Hyperlipidemia   . Hypertension   . Myocardial infarction (HCC)   . Neuromuscular disorder (HCC)   . PAD (peripheral artery disease) (HCC)   . Protein calorie malnutrition (HCC) 04/2017  . Psychotic affective disorder (HCC)    Per sister. Unsure of Dx. Pt sleeps late and difficult to wake. odd affect  . Sacral decubitus ulcer 04/2017  . Stroke [facility](HCC)    left side weakness   Past Surgical History:  Past Surgical History:  Procedure Laterality Date  . ABDOMINAL AORTOGRAM W/LOWER EXTREMITY N/A 06/08/2017   Procedure: ABDOMINAL AORTOGRAM W/LOWER EXTREMITY;  Surgeon: Chuck Hintickson, Christopher S, MD;  [facility];  Service: Cardiovascular;  Laterality: N/A;  . ABOVE KNEE LEG AMPUTATION Bilateral 01/13/2018  . AMPUTATION Bilateral 01/13/2018   Procedure: BILATERAL ABOVE KNEE AMPUTATION;  Surgeon: Nadara Mustarduda, Marcus V, MD;  [facility];  Service: Orthopedics;  Laterality: Bilateral;  . BACK SURGERY    . CARDIAC CATHETERIZATION    . CARDIAC SURGERY     CABG x5 2012 - [facility]  . CAROTID-SUBCLAVIAN BYPASS GRAFT Left 07/23/2017   Procedure: BYPASS SUBCLAVIAN ARTERY  USING VIABAHN GORE STENT GRAFT;  Surgeon: Chuck Hintickson, Christopher S, MD;  [facility];  Service: Vascular;  Laterality: Left;  . LEFT HEART CATH AND CORS/GRAFTS ANGIOGRAPHY N/A 06/26/2017   Procedure: LEFT HEART CATH AND CORS/GRAFTS ANGIOGRAPHY;  Surgeon: Tonny Bollmanooper, Michael, MD;  [facility];  Service: Cardiovascular;  Laterality:  N/A;   HPI:  [name] is a 67 year old M with PMH of type 2 diabetes, HLD, HTN, CVA w/ residual left-sided weakness, PVD, CAD status post CABG (2012), and NSTEMI (04/2017) presenting with respiratory distress, AMS, lethargy and fever. Chest x-ray shows mild bibasilar subsegmental atelectasis or infiltrates.    Assessment / Plan / Recommendation Clinical Impression  Patient has a known oral dysphagia and presented to the hospital with shortness of breath, AMS, congestion and fever. Oral motor exam reveals reduced oral coordination and strength consistent with previous swallow evaluation 12/31/17, He had poor mastication and bolus awareness with solid consistencies. Suspect pharyngeal dysphagia as patient had wet vocal quality with all volumes (tsp, cup sip, straw). He had no clearing or coughing. Recommend MBS to further assess swallow function and rule out silent aspiration. Nurse notified of plan. SLP Visit Diagnosis: Dysphagia, unspecified (R13.10)    Aspiration Risk       Diet Recommendation NPO          Swallow Study   General Date of Onset: 08/01/18 HPI: [name] is a 67 year old M with PMH of type 2 diabetes, HLD, HTN, CVA w/ residual left-sided weakness, PVD, CAD status post CABG (2012), and NSTEMI (04/2017) presenting with respiratory distress, AMS, lethargy and fever. Chest x-ray shows mild bibasilar subsegmental atelectasis or infiltrates.  Type of Study: Bedside Swallow Evaluation Previous Swallow Assessment: 07/31/17 and 12/31/17 Diet Prior to this Study: NPO Temperature Spikes Noted: Yes Respiratory Status: Nasal cannula;Other (comment)(bipap) History of Recent Intubation: No Behavior/Cognition:  Alert;Cooperative Oral Cavity Assessment: Dry;Dried secretions Oral Care Completed by SLP: Yes Oral Cavity - Dentition: Poor condition;Missing dentition Vision: Functional for self-feeding Self-Feeding Abilities: Needs assist Patient Positioning: Upright in bed Baseline  Vocal Quality: Normal;Low vocal intensity Volitional Cough: Strong Volitional Swallow: Able to elicit    Oral/Motor/Sensory Function Overall Oral Motor/Sensory Function: Mild impairment Facial ROM: Within Functional Limits Facial Symmetry: Within Functional Limits Facial Strength: Suspected CN VII (facial) dysfunction Facial Sensation: Within Functional Limits Lingual ROM: Within Functional Limits Lingual Symmetry: Within Functional Limits Lingual Strength: Reduced Lingual Sensation: Within Functional Limits   Ice Chips Ice chips: Within functional limits Presentation: Spoon   Thin Liquid Thin Liquid: Impaired Presentation: Cup;Spoon;Straw Oral Phase Impairments: Reduced lingual movement/coordination;Poor awareness of bolus Pharyngeal  Phase Impairments: Wet Vocal Quality    Nectar Thick Nectar Thick Liquid: Not tested   Honey Thick Honey Thick Liquid: Not tested   Puree Puree: Within functional limits Presentation: Spoon   Solid     Solid: Impaired Presentation: Spoon Oral Phase Impairments: Impaired mastication;Poor awareness of bolus Oral Phase Functional Implications: Prolonged oral transit;Impaired mastication Pharyngeal Phase Impairments: Wet Vocal Quality      Lindalou Hose Bhavesh Vazquez, MA, CCC-SLP 08/02/2018 9:52 AM

## 2018-08-02 NOTE — Progress Notes (Signed)
Subjective:  Jordan Gardner is a 67 y.o. with PMH of NSTEMI, CAD s/p CABG, HLD, HTN, CVA, PVD, T2DM and dementia admit for sepsis on hospital day 1  [name] was examined and evaluated at bedside this AM with family present. He was observed interacting appropriately and following directions with the dietician. He states he 'does not feel good' and is not doing well.  He states he had respiratory failure for 3 months post-op in the past but had been doing well on room air for the last couple months. He has no history of pulmonary disease as far as he knows but he is a former smoker. He is endorsing dyspnea, dry cough without productive sputum and states he has some chills. Continuing to endorse some nausea, vomiting, and diarrhea. He denies any chest pain, palpitations, headache, blurry vision, hemoptysis.  Objective:  Vital signs in last 24 hours: Vitals:   08/01/18 1456 08/01/18 1802 08/01/18 2127 08/02/18 0535  BP:   133/66 129/66  Pulse:   83 87  Resp:   18 19  Temp:   98.8 F (37.1 C) 99.4 F (37.4 C)  TempSrc:   Oral Oral  SpO2: 97% 95% 96% 94%  Weight:       Physical Exam  Constitutional: No distress.  Chronically ill-appearing  HENT:  Head: Normocephalic and atraumatic.  Mouth/Throat: No oropharyngeal exudate.  Dry mucous membranes  Eyes: Pupils are equal, round, and reactive to light. Conjunctivae and EOM are normal. No scleral icterus.  Neck: Normal range of motion. Neck supple. No JVD present.  Cardiovascular: Normal rate, regular rhythm, normal heart sounds and intact distal pulses.  No murmur heard. Pulmonary/Chest: Effort normal. No respiratory distress. He has rales (bibasilar rales).  Abdominal: Soft. Bowel sounds are normal.  Lymphadenopathy:    He has no cervical adenopathy.  Skin: He is not diaphoretic.   Assessment/Plan:  Active Problems:   Sepsis due to pneumonia [facility](HCC)  [name] is a 67 year old M with PMH of T2DM, HTN, HLD, PVD S/P bilateral  AKA, CAD S/P CABG, CVA W left-sided weakness, HFrEF, and NSTEMI admitted for sepsis. Continuing treatment with IV antibiotics for presumed pneumonia. No growth on blood culture day 2.   Sepsis 2/2 aspiration vs pneumonia Wbc 20.9->14.3 may be dilutional, still tachycardic, no longer tachypnea, satting 96 on 6L O2 [place]. BP stable at 129/66 this AM. UA show no nitrites, leukocytes. Moderate aspiration risk on S&S eval - F/u blood cultures, ua, urine cultures - Trend CBC - Change abx regimen to vanc, ceftriaxone, flagyl - may need pseudomonas coverage back on if spiking fevers - Get Legionella, Strep Pneumo urinary antigens - Keep O2 sat >88  Normocytic anemia 2/2 dilutional Hgb 12.3->9.5. Received ~4L yesterday. WBC, Hgb and platelet all decreased so most likely dilutional change - Monitor CBC  HRrEF Last Echo 06/2017 show EF 50%, grade 1 diastolic dysfunction - Restart metoprolol - May need lasix if respiratory distress due to fluid  AKI 2/2 dehydration Creatine down trending 3.17->2.83 w/ fluids - Trend BMP  T2DM - SSI - Glucose checks  HTN Admit bp 143/77, On metoprolol tartrate 25mg  BID, lisinopril 2.5mg  daily, Imdur 60mg  daily - Holding home bp meds for now for fluctating bp  Depression/Anxiety On trazodone 50mg  qhs, zoloft 200mg  daily, Xanax 0.25mg  qhs - C/w home meds: trazodone 50mg  qhs, sertraline 200mg  daily  DVT prophx: subqheparin Diet: Dysphagia 2 Bowel: Senokot Code: DNR  Dispo: Anticipated discharge in approximately 2-3 day(s).   Jordan Gardner, Jordan Friedel K,  MD 08/02/2018, 12:20 PM Pager: [phone]

## 2018-08-02 NOTE — Progress Notes (Signed)
  Date: 08/02/2018  Patient name: Jordan Gardner  Medical record number: [id number]  Date of birth: 03/15/1951   I have seen and evaluated Jordan Gardner and discussed their care with the Residency Team. Briefly, [name] is a 67 year old man who presented from [facility] for fever, chills, nausea, vomiting, diearrhea, cough, chills and congestion.  He was treated for sepsis in the ED and found to have an infiltrate on CXR.  There is some concern from the SNF staff that he may be aspirating as he prefers to take his medications lying down.  In the ED, he required BIPAP and this has improved to [place] this morning.  He remains dyspneic. CXR showed mild bibasilar infiltrates.  PCT was elevated to 12, lactate was elevated to 4.  WBC was 14.    PMHx, Fam Hx, and/or Soc Hx : he is a former smoker.  Has not had ETOH since being in the nursing home.  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 I agree with the formulated Assessment and Plan as detailed in the residents' note, with the following changes:   1. Sepsis 2/2 pneumonia - CXR is not completely convincing, but given cough, fever, tachypnea and elevated WBC and PCT we will treat as bacterial pneumonia - Check urinary antigens - Continue Oxygen to keep O2 saturation > 90 - Vanc and cefepime - Follow up cultures - Trend lactic acid and CBC  2. HFrEF - EF is  50%, will need to be careful with fluids, already received > 4L in the ED - Hold metoprolol; restart lasix if he becomes volume up  He is DNR/DNI.  Other issues per resident note.   [name],  B, MD 12/16/20192:44 PM

## 2018-08-03 DIAGNOSIS — R4182 Altered mental status, unspecified: Secondary | ICD-10-CM

## 2018-08-03 DIAGNOSIS — F339 Major depressive disorder, recurrent, unspecified: Secondary | ICD-10-CM

## 2018-08-03 DIAGNOSIS — J690 Pneumonitis due to inhalation of food and vomit: Secondary | ICD-10-CM

## 2018-08-03 DIAGNOSIS — I69354 Hemiplegia and hemiparesis following cerebral infarction affecting left non-dominant side: Secondary | ICD-10-CM

## 2018-08-03 DIAGNOSIS — R253 Fasciculation: Secondary | ICD-10-CM

## 2018-08-03 DIAGNOSIS — I5022 Chronic systolic (congestive) heart failure: Secondary | ICD-10-CM

## 2018-08-03 LAB — BASIC METABOLIC PANEL
Anion gap: 9 (ref 5–15)
BUN: 92 mg/dL — ABNORMAL HIGH (ref 8–23)
CO2: 17 mmol/L — ABNORMAL LOW (ref 22–32)
Calcium: 9.1 mg/dL (ref 8.9–10.3)
Chloride: 120 mmol/L — ABNORMAL HIGH (ref 98–111)
Creatinine, Ser: 2.6 mg/dL — ABNORMAL HIGH (ref 0.61–1.24)
GFR calc Af Amer: 29 mL/min — ABNORMAL LOW (ref >60)
GFR, EST NON AFRICAN AMERICAN: 25 mL/min — AB (ref >60)
Glucose, Bld: 208 mg/dL — ABNORMAL HIGH (ref 70–99)
Potassium: 5.3 mmol/L — ABNORMAL HIGH (ref 3.5–5.1)
SODIUM: 146 mmol/L — AB (ref 135–145)

## 2018-08-03 LAB — GLUCOSE, CAPILLARY
GLUCOSE-CAPILLARY: 269 mg/dL — AB (ref 70–99)
Glucose-Capillary: 291 mg/dL — ABNORMAL HIGH (ref 70–99)
Glucose-Capillary: 256 mg/dL — ABNORMAL HIGH (ref 70–99)

## 2018-08-03 LAB — CBC
HCT: 30.1 % — ABNORMAL LOW (ref 39.0–52.0)
Hemoglobin: 8.9 g/dL — ABNORMAL LOW (ref 13.0–17.0)
MCH: 26.3 pg (ref 26.0–34.0)
MCHC: 29.6 g/dL — ABNORMAL LOW (ref 30.0–36.0)
MCV: 88.8 fL (ref 80.0–100.0)
Platelets: 126 10*3/uL — ABNORMAL LOW (ref 150–400)
RBC: 3.39 MIL/uL — ABNORMAL LOW (ref 4.22–5.81)
RDW: 15.3 % (ref 11.5–15.5)
WBC: 11 10*3/uL — ABNORMAL HIGH (ref 4.0–10.5)
nRBC: 0 % (ref 0.0–0.2)

## 2018-08-03 LAB — LEGIONELLA PNEUMOPHILA SEROGP 1 UR AG: L. pneumophila Serogp 1 Ur Ag: NEGATIVE

## 2018-08-03 LAB — HEMOGLOBIN A1C
Hgb A1c MFr Bld: 8 % — ABNORMAL HIGH (ref 4.8–5.6)
Mean Plasma Glucose: 182.9 mg/dL

## 2018-08-03 MED ORDER — GERHARDT'S BUTT CREAM
TOPICAL_CREAM | Freq: Four times a day (QID) | CUTANEOUS | Status: AC
  Administered 2018-08-03: 10:00:00 via TOPICAL
  Administered 2018-08-03: 1 via TOPICAL
  Administered 2018-08-03 – 2018-08-08 (×16): via TOPICAL
  Filled 2018-08-03: qty 1

## 2018-08-03 MED ORDER — RESOURCE THICKENUP CLEAR PO POWD
ORAL | Status: DC | PRN
  Filled 2018-08-03: qty 125

## 2018-08-03 MED ORDER — THIAMINE HCL 100 MG/ML IJ SOLN
100.0000 mg | Freq: Every day | INTRAMUSCULAR | Status: DC
  Administered 2018-08-03 – 2018-08-06 (×4): 100 mg via INTRAVENOUS
  Filled 2018-08-03 (×4): qty 2

## 2018-08-03 MED ORDER — VANCOMYCIN HCL IN DEXTROSE 750-5 MG/150ML-% IV SOLN
750.0000 mg | INTRAVENOUS | Status: DC
  Administered 2018-08-04 – 2018-08-05 (×2): 750 mg via INTRAVENOUS
  Filled 2018-08-03 (×2): qty 150

## 2018-08-03 MED ORDER — INSULIN ASPART 100 UNIT/ML ~~LOC~~ SOLN
0.0000 [IU] | Freq: Three times a day (TID) | SUBCUTANEOUS | Status: DC
  Administered 2018-08-03: 8 [IU] via SUBCUTANEOUS
  Administered 2018-08-04: 5 [IU] via SUBCUTANEOUS
  Administered 2018-08-04: 3 [IU] via SUBCUTANEOUS
  Administered 2018-08-04: 5 [IU] via SUBCUTANEOUS
  Administered 2018-08-05: 3 [IU] via SUBCUTANEOUS
  Administered 2018-08-05: 2 [IU] via SUBCUTANEOUS
  Administered 2018-08-05 – 2018-08-07 (×4): 3 [IU] via SUBCUTANEOUS
  Administered 2018-08-08: 2 [IU] via SUBCUTANEOUS
  Administered 2018-08-08: 3 [IU] via SUBCUTANEOUS
  Administered 2018-08-09: 2 [IU] via SUBCUTANEOUS

## 2018-08-03 NOTE — Progress Notes (Signed)
Pharmacy Antibiotic Note  Jordan Gardner is a 67 y.o. male admitted on 08/01/2018 with sepsis.  Pharmacy has been consulted for Vancomycin dosing.  Initial AKI downtrending SCr 3.17>2.6, CrCl ~ 2426mL/min.    Plan: Change vancomycin to 750mg  IV every 24 hours (will use nomogram dosing in setting of AKI) F/u renal function, clinical progression and Cx to narrow Vancomycin level at steady state  Weight: 144 lb 2.9 oz (65.4 kg)  Temp (24hrs), Avg:98.8 F (37.1 C), Min:98.4 F (36.9 C), Max:99.1 F (37.3 C)  Recent Labs  Lab 08/01/18 0640 08/01/18 0658 08/01/18 0837 08/01/18 0841 08/01/18 1320 08/01/18 1635 08/02/18 0317 08/03/18 0332  WBC 20.9*  --   --   --   --   --  14.3* 11.0*  CREATININE 3.17*  --  2.99*  --   --   --  2.83* 2.60*  LATICACIDVEN  --  4.77*  --  4.43* 4.6* 3.4*  --   --     Estimated Creatinine Clearance: 25.9 mL/min (A) (by C-G formula based on SCr of 2.6 mg/dL (H)).    No Known Allergies  Ceftriaxone 12/17>> Vancomycin 12/14> Cefepime 12/14>12/17 Flagyl 12/14>12/17   MRSA PCR + 12/15 BCx: ngtd 12/15 UCx: no growth  Jordan Gardner, PharmD Clinical Pharmacist Please check AMION for all [facility] numbers 08/03/2018 12:31 PM

## 2018-08-03 NOTE — Progress Notes (Signed)
  Date: 08/03/2018  Patient name: Jordan Gardner  Medical record number: [id number]  Date of birth: 06/17/1951   I have seen and evaluated this patient and I have discussed the plan of care with the house staff. Please see Dr. Cannon Kettlehundi's note for complete details. I concur with her findings with the following additions/corrections:   Fasciculations were prominent on exam today, he further has bulbar signs and grip strength issues.  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  I am curious if these symptoms developed just recently or have been going on for a while (subacute presentation would be more classic for ALS than this acute presentation).  We are assessing for vitamin deficiency as well.  Continue to monitor.  If possible, consider EMG/NCS in the hospital.   Jordan Gardner, Jordan Mangas B, MD 08/03/2018, 8:33 PM

## 2018-08-03 NOTE — Progress Notes (Signed)
   Subjective:  [name] was seen resting in his bed this morning with wife at bedside. Nursing staff and family members mentioned that the patient has been having decreased grip strength, fasciculations, and has been aspirating thin liquids.   Objective:  Vital signs in last 24 hours: Vitals:   08/03/18 0118 08/03/18 0538 08/03/18 0758 08/03/18 1300  BP:  (!) 152/74  (!) 154/76  Pulse:  82  98  Resp:  17  18  Temp:  98.4 F (36.9 C)  98.7 F (37.1 C)  TempSrc:  Oral  Oral  SpO2: 100%  94% 98%  Weight:       Physical Exam  Constitutional: He appears well-developed and well-nourished. No distress.  HENT:  Head: Normocephalic and atraumatic.  Eyes: Conjunctivae are normal.  Cardiovascular: Normal rate, regular rhythm and normal heart sounds.  Respiratory: Effort normal and breath sounds normal. No respiratory distress. He has no wheezes.  GI: Soft. Bowel sounds are normal. He exhibits no distension. There is no abdominal tenderness.  Genitourinary:    Genitourinary Comments: Yellow stained bedsheets   Musculoskeletal:        General: No edema.     Comments: Stub site edematous Fasciculations in bilateral upper extremities  Neurological: He is alert.  Decreased sensation around right forearm surrounding site of iv  Skin: He is not diaphoretic. No erythema.  Psychiatric: He has a normal mood and affect. His behavior is normal. Judgment and thought content normal.   Assessment/Plan:  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   Sepsis 2/2 aspiration pneumonia Patient's blood culture drawn 12/15 has been negative for 2 days, strep pneumo and legionella negative. Patient's wbc is down-trending to 11 from 14 today and he has remained afebrile.  -Continue vancomycin -Cefepime narrowed to ceftriaxone  -Total duration of abx=3  days -Transition from thin liquid diet to thick liquid diet  Acute neurological changes The patient has been having fasciculations in bilateral upper extremities, dropping objects, having decreased grip strength in bilateral upper extremities, decreased sensation in right upper extremity around IV site.  These symptoms are thought to be due to deconditioning, nutirional deficiencies, umn problem   -Vitamin b1 level pending  -IV thiamine 100mg   -PT consult  -May consider EMG   Diabetes Mellitus type 2 The patient's blood glucose has ranged 200-291 over the past 24 hrs.   -ssi  Hypertension The patient's blood pressure has ranged 140-150s/70-80s over the past 24 hrs.  -continue metoprolol 25mg  bid   Prerenal AKI Creatinine = 2.6 from 2.8 yesterday.  Baseline creatinine = 1.0-1.2.  -Courage p.o. intake  Chronic systolic heart failure Patient's last echo was done in November 2018 which showed LVEF 50%, G1DD, hypokinesis of basal mid inferior lateral and inferior myocardium, mildly dilated left atrium.   -Daily weights are not being collected and I/Os not being collected.  -TTE ordered volume status as patient has been given IV fluids throughout admission and it appears that patient's bilateral stumps are edematous today.  Depression/anxiety  -Continue trazodone 50 mg nightly, sertraline 200 mg daily   Dispo: Anticipated discharge in approximately 2-3 day(s).   Jordan Gardner, Jordan Santo, MD 08/03/2018, 4:09 PM Pager: [phone]

## 2018-08-03 NOTE — Progress Notes (Signed)
RT NOTE: Patient told RT that he was ready for breathing treatment but once RT started to put the nebulizer mask on patients face he refused 3 times and stated that the medication hurts his head and that he did not want it after all. RT will continue to monitor as needed.

## 2018-08-03 NOTE — Progress Notes (Signed)
  Speech Language Pathology Treatment: Dysphagia  Patient Details Name: Jordan Gardner MRN: [id number] DOB: 04/02/1951 Today's Date: 08/03/2018 Time: [id number] SLP Time Calculation (min) (ACUTE ONLY): 21 min  Assessment / Plan / Recommendation Clinical Impression  Jordan Gardner continues to present with overt signs of aspiration characterized by coughing post swallow, despite SLP cueing for small single cup sips of thin liquid, in approximately 50% of boluses. RN confirming that there has been observed coughing primarily with meals. Highly suspect that patient with inconsistent delay in swallow initiation noted upon review of MBS impacting airway protection with thin liquid, even with cup sips. Trials of nectar thick liquid provided with no overt indication of aspiration. Will downgrade diet. Hopeful for ability to advance at bedside as overall medical condition, particularly SOB impacting coordination of breath and swallow, improves.    HPI HPI: Jordan Gardner is a 67 year old M with PMH of type 2 diabetes, HLD, HTN, CVA w/ residual left-sided weakness, PVD, CAD status post CABG (2012), and NSTEMI (04/2017) presenting with respiratory distress, AMS, lethargy and fever. Chest x-ray shows mild bibasilar subsegmental atelectasis or infiltrates.       SLP Plan  Continue with current plan of care       Recommendations  Diet recommendations: Dysphagia 2 (fine chop);Nectar-thick liquid Liquids provided via: Cup;No straw Medication Administration: Crushed with puree Supervision: Staff to assist with self feeding;Full supervision/cueing for compensatory strategies Compensations: Slow rate;Small sips/bites;Follow solids with liquid Postural Changes and/or Swallow Maneuvers: Seated upright 90 degrees                Oral Care Recommendations: Oral care BID Follow up Recommendations: Skilled Nursing facility SLP Visit Diagnosis: Dysphagia, oropharyngeal phase (R13.12) Plan: Continue with  current plan of care       GO          Folasade Mooty MA, CCC-SLP        Brenson Hartman Meryl 08/03/2018, 10:21 AM

## 2018-08-03 NOTE — Consult Note (Signed)
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 Wound type: Chronic, nonhealing pressure injury complicated by moisture associated skin damage Pressure Injury POA: Yes Measurement: 2.6cm x 0.6cm x 0.2 Wound bed: red, smooth (not granulating Drainage (amount, consistency, odor) small amount serous exudate Periwound:macerated, white rind of surrounding tissue measuring 0.4cm Dressing procedure/placement/frequency: I will provide guidance via the orders for turning and repositioning from side to side to expose the tissue to air. No silicone foams are to be used. Gerhart's Butt Cream (a 1:1:1 compounded topical consisting of zinc oxide, lotrimin and hydrocortisone cream) is ordered 4 times daily.  A pressure redistribution chair pad is provided for his OOB use at the facility in which he resides.  WOC nursing team will not follow, but will remain available to this patient, the nursing and medical teams.  Please re-consult if needed. Thanks, Ladona MowLaurie Shandy Checo, MSN, RN, GNP, Hans EdenCWOCN, CWON-AP, FAAN  Pager# [phone]

## 2018-08-04 ENCOUNTER — Inpatient Hospital Stay (HOSPITAL_COMMUNITY): Payer: Medicare HMO

## 2018-08-04 DIAGNOSIS — Z22322 Carrier or suspected carrier of Methicillin resistant Staphylococcus aureus: Secondary | ICD-10-CM

## 2018-08-04 DIAGNOSIS — R531 Weakness: Secondary | ICD-10-CM

## 2018-08-04 DIAGNOSIS — R251 Tremor, unspecified: Secondary | ICD-10-CM

## 2018-08-04 LAB — CBC
HCT: 31.8 % — ABNORMAL LOW (ref 39.0–52.0)
Hemoglobin: 9.9 g/dL — ABNORMAL LOW (ref 13.0–17.0)
MCH: 27.8 pg (ref 26.0–34.0)
MCHC: 31.1 g/dL (ref 30.0–36.0)
MCV: 89.3 fL (ref 80.0–100.0)
Platelets: 137 10*3/uL — ABNORMAL LOW (ref 150–400)
RBC: 3.56 MIL/uL — ABNORMAL LOW (ref 4.22–5.81)
RDW: 15.3 % (ref 11.5–15.5)
WBC: 9.9 10*3/uL (ref 4.0–10.5)
nRBC: 0 % (ref 0.0–0.2)

## 2018-08-04 LAB — BASIC METABOLIC PANEL
Anion gap: 9 (ref 5–15)
BUN: 78 mg/dL — ABNORMAL HIGH (ref 8–23)
CO2: 16 mmol/L — ABNORMAL LOW (ref 22–32)
Calcium: 9.6 mg/dL (ref 8.9–10.3)
Chloride: 120 mmol/L — ABNORMAL HIGH (ref 98–111)
Creatinine, Ser: 2.44 mg/dL — ABNORMAL HIGH (ref 0.61–1.24)
GFR calc Af Amer: 31 mL/min — ABNORMAL LOW (ref >60)
GFR calc non Af Amer: 27 mL/min — ABNORMAL LOW (ref >60)
Glucose, Bld: 246 mg/dL — ABNORMAL HIGH (ref 70–99)
Potassium: 5.1 mmol/L (ref 3.5–5.1)
Sodium: 145 mmol/L (ref 135–145)

## 2018-08-04 LAB — GLUCOSE, CAPILLARY
GLUCOSE-CAPILLARY: 210 mg/dL — AB (ref 70–99)
Glucose-Capillary: 226 mg/dL — ABNORMAL HIGH (ref 70–99)
Glucose-Capillary: 175 mg/dL — ABNORMAL HIGH (ref 70–99)
Glucose-Capillary: 150 mg/dL — ABNORMAL HIGH (ref 70–99)

## 2018-08-04 MED ORDER — LIDOCAINE 5 % EX PTCH
1.0000 | MEDICATED_PATCH | CUTANEOUS | Status: DC
  Administered 2018-08-04 – 2018-08-09 (×6): 1 via TRANSDERMAL
  Filled 2018-08-04 (×6): qty 1

## 2018-08-04 MED ORDER — ISOSORBIDE MONONITRATE ER 60 MG PO TB24
60.0000 mg | ORAL_TABLET | Freq: Every day | ORAL | Status: DC
  Administered 2018-08-04 – 2018-08-09 (×6): 60 mg via ORAL
  Filled 2018-08-04 (×6): qty 1

## 2018-08-04 MED ORDER — INSULIN GLARGINE 100 UNIT/ML ~~LOC~~ SOLN
10.0000 [IU] | Freq: Every day | SUBCUTANEOUS | Status: DC
  Administered 2018-08-04 – 2018-08-08 (×5): 10 [IU] via SUBCUTANEOUS
  Filled 2018-08-04 (×6): qty 0.1

## 2018-08-04 NOTE — Progress Notes (Signed)
Inpatient Diabetes Program Recommendations  AACE/ADA: New Consensus Statement on Inpatient Glycemic Control (2015)  Target Ranges:  Prepandial:   less than 140 mg/dL      Peak postprandial:   less than 180 mg/dL (1-2 hours)      Critically ill patients:  140 - 180 mg/dL   Lab Results  Component Value Date   GLUCAP 210 (H) 08/04/2018   HGBA1C 8.0 (H) 08/03/2018    Review of Glycemic Control Results for Jordan Gardner, [name] (MRN [id number]) as of 08/04/2018 10:29  Ref. Range 08/03/2018 17:32 08/03/2018 20:51 08/04/2018 07:45  Glucose-Capillary Latest Ref Range: 70 - 99 mg/dL 782269 (H) 956256 (H) 213210 (H)   Diabetes history: Type 2 DM Outpatient Diabetes medications: Metformin 1000 mg bid Current orders for Inpatient glycemic control: Novolog 0-15 units TID  Inpatient Diabetes Program Recommendations:    Consider adding Lantus 10 units QD and Novolog 0-5 units QHS.  Thanks, Lujean RaveLauren Ammanda Dobbins, MSN, RNC-OB Diabetes Coordinator [phone] (8a-5p)

## 2018-08-04 NOTE — Progress Notes (Signed)
  Speech Language Pathology Treatment: Dysphagia  Patient Details Name: Jordan Gardner MRN: [id number] DOB: 03/22/1951 Today's Date: 08/04/2018 Time: [id number] SLP Time Calculation (min) (ACUTE ONLY): 25 min  Assessment / Plan / Recommendation Clinical Impression  Patient seen with breakfast tray. He had no coughing or clearing with nectar thickened liquid of all bolus sizes (no straws). Tolerating pureed consistencies well with decreased oral manipulation and better coordination of breathing, chewing and swallowing.  Educated patient of aspiration risk and need for thicker liquids. Patient agreed he could tolerate the thicker liquids a little longer.  He did complain of sore throat and burning with orange juice. Made nurse aware.    HPI HPI: JordanGardner is a 67 year old M with PMH of type 2 diabetes, HLD, HTN, CVA w/ residual left-sided weakness, PVD, CAD status post CABG (2012), and NSTEMI (04/2017) presenting with respiratory distress, AMS, lethargy and fever. Chest x-ray shows mild bibasilar subsegmental atelectasis or infiltrates.       SLP Plan  Continue with current plan of care       Recommendations  Diet recommendations: Dysphagia 1 (puree);Nectar-thick liquid Liquids provided via: Cup;No straw Medication Administration: Crushed with puree Supervision: Staff to assist with self feeding;Full supervision/cueing for compensatory strategies Compensations: Slow rate;Small sips/bites;Follow solids with liquid Postural Changes and/or Swallow Maneuvers: Seated upright 90 degrees                Oral Care Recommendations: Oral care BID Follow up Recommendations: Skilled Nursing facility SLP Visit Diagnosis: Dysphagia, oropharyngeal phase (R13.12) Plan: Continue with current plan of care       GO               Jordan HoseSarah J. Toniesha Zellner, MA, CCC-SLP 08/04/2018 9:36 AM

## 2018-08-04 NOTE — Progress Notes (Signed)
  Date: 08/04/2018  Patient name: Blayton Lorella NimrodHarvey  Medical record number: [id number]  Date of birth: 06/15/1951   I have seen and evaluated this patient and I have discussed the plan of care with the house staff. Please see Dr. Marigene EhlersLee's note for complete details. I concur with his findings with the following additions/corrections:   Can consider stopping Vancomycin tomorrow.  I know he has had a positive nasal swab for MRSA, but I do not think his clinical picture is indicative of a MRSA pneumonia at this time.   I am concerned about his bulbar symptoms, tremor, fasciculations.  I am concerned he may have ALS or another neuropathic disorder.  We are not able to get EMG/NCS in the inpatient setting, but this should be worked up outpatient.  Would do a med review of trazodone and sertraline as well to see if symptoms could be caused by this.  Thiamine level is pending.   [name], Dontell Mian B, MD 08/04/2018, 9:47 PM

## 2018-08-04 NOTE — Progress Notes (Signed)
Subjective:  Jordan Gardner is a 67 y.o. with PMH of T2DM, HTN, HLD, PVD S/P bilateral AKA, CAD S/P CABG, CVA W left-sided weakness,HFrEF, and NSTEMI on hospital day 3  [name] was examined and evaluated at bedside while eating breakfast this AM. He states he doesn't want to eat anymore because of pork products in the food as she does not eat pork. Reassured him that I would let dietary aware. He states he feels pain around his stump area despite his gabapentin and would like additional pain control. When questioned about his resting tremors, he states they have been present since couple months ago and appeared to be getting worse. He was also observed drinking sips and found to have no choking or coughing. Denies any F/N/V/D/c. Denies any worsening shortness of breath, chest pain, palpitations.  Contact with his nursing home providers reveal that he may have had some resting tremors on admission but mostly unremarkable and rare. They mention initially after his bilateral aka, he had significant upper motor weakness and dysphagia but these symptoms improved with good nutrition and physical therapy.  Objective:  Vital signs in last 24 hours: Vitals:   08/03/18 2142 08/04/18 0238 08/04/18 0457 08/04/18 0736  BP: (!) 176/89  (!) 152/90   Pulse: (!) 118 100 98   Resp: 18 20 18    Temp: 98.9 F (37.2 C)  97.9 F (36.6 C)   TempSrc:      SpO2: 100% 100% 97% 98%  Weight:       Physical Exam  Constitutional: He is oriented to person, place, and time. No distress.  Chronically ill-appearing  HENT:  Head: Normocephalic and atraumatic.  Mouth/Throat: Oropharynx is clear and moist. No oropharyngeal exudate.  Eyes: Pupils are equal, round, and reactive to light. Conjunctivae and EOM are normal. No scleral icterus.  Neck: Normal range of motion. Neck supple. No JVD present.  Cardiovascular: Normal rate, regular rhythm, normal heart sounds and intact distal pulses.  Pulmonary/Chest: Effort  normal. He has no wheezes. He has rales (Coarse breath sounds throughout).  Accessory muscle use  Abdominal: Soft. Bowel sounds are normal. There is no abdominal tenderness.  Musculoskeletal: Normal range of motion.        General: Deformity (bilateral aka) present.  Neurological: He is alert and oriented to person, place, and time. No cranial nerve deficit. GCS score is 15.  Resting tremors. Significant bilateral upper extremity weakness. Unable to ascertain lower extremity weakness. CNII-XII intact. Sensation to light touch intact.  Skin: Skin is warm and dry. He is not diaphoretic.    Assessment/Plan:  Active Problems:   Sepsis due to pneumonia (HCC)   Pneumonia of both lower lobes due to infectious organism [facility](HCC)  [name] is a 67 year old M with PMH of T2DM, HTN, HLD, PVD S/P bilateral AKA, CAD S/P CABG, CVA W left-sided weakness,HFrEF, and NSTEMIadmitted for sepsis. Continuing treatment with IV antibiotics for presumed pneumonia. No growth on blood culture day 3.   Sepsis 2/2 aspiration vs pneumonia Wbc 20.9->14.3->11->9.9. Afebrile Vitals stable. Blood cultures NGTD. S&S concerned about continuing aspiration risk. Legionella, Strep Pneumo urinary antigens negative. O2 sat 100 on 2L - Trend CBC - C/w vancomycin, ceftriaxone - Keep O2 sat >88  Resting Tremors, Dysphagia, Weakness 2/2 ?ALS Unclear duration dysphagia, weakness and resting tremors initially thought to be 2/2 deconditioning. 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 Neuro cannot perform EMG inpatient - F/u Vitamin B1 - F/u PT eval - S/S recommend thick fluid 2/2  worsening dysphagia - Neuro F/u as outpatient  Normocytic anemia 2/2 dilutional Hgb 12.3->9.5. Received ~4L yesterday. WBC, Hgb and platelet all decreased so most likely dilutional change - Monitor CBC  Chronic Systolic Heart Failure Last Echo 06/2017 show EF 50%, grade 1 diastolic dysfunction - F/u TTE - C/w metoprolol 25mg  BID, imdur 60mg   daily  AKI 2/2 dehydration Creatine down trending 3.17->2.83->2.44 w/ oral intake - Trend BMP  T2DM Fasting 210 this 18 unit novolog requirement yesterday - Appreciate diabetes coordinator recs - Lantus 10 units qhs - SSI - Glucose checks  HTN BP this AM 152/74 - C/w metoprolol, imdur - Holding lisinopril from AKI  Depression/Anxiety On trazodone 50mg  qhs, zoloft 200mg  daily, Xanax 0.25mg  qhs - C/w home meds: trazodone 50mg  qhs, sertraline 200mg  daily  DVT prophx: subqheparin Diet: Dysphagia 1 per SSP Bowel: Senokot Code: DNR  Dispo: Anticipated discharge in approximately 2-4 day(s).   Jordan Barrio, MD 08/04/2018, 10:53 AM Pager: [phone]

## 2018-08-04 NOTE — Evaluation (Signed)
Physical Therapy Evaluation Patient Details Name: Jordan Gardner MRN: [id number] DOB: 1951-03-16 Today's Date: 08/04/2018   History of Present Illness  [name] is a 67 year old M with PMH of type 2 diabetes, HLD, HTN, CVA w/ residual left-sided weakness, PVD, CAD status post CABG (2012), and NSTEMI (04/2017) presenting with respiratory distress.  CT of chest shows lower lob infiltrate suggesting PNA  Clinical Impression  Pt is admitted for probable PNA, suspected aspiration.  He is a total assist at this time which is likely his baseline.  He needs to be transferred via lift to chair while in the hospital and resume PT or restorative nursing at [facility]  We will d/c him from acute PT at this time    Follow Up Recommendations SNF;Other (comment)(to restorative nursing with PT guidance)    Equipment Recommendations  None recommended by PT    Recommendations for Other Services       Precautions / Restrictions Precautions Precautions: Fall      Mobility  Bed Mobility Overal bed mobility: Needs Assistance Bed Mobility: Rolling;Sidelying to Sit;Sit to Supine Rolling: Total assist Sidelying to sit: Total assist   Sit to supine: Total assist   General bed mobility comments: pt does not attempt to assist and needs cues for direction and full truncal assist to come up.  Transfers                 General transfer comment: NT  Ambulation/Gait                Stairs            Wheelchair Mobility    Modified Rankin (Stroke Patients Only)       Balance Overall balance assessment: Needs assistance   Sitting balance-Leahy Scale: Zero Sitting balance - Comments: no self initiated righting/truncal reactions.  Moderately heavy posterior lean.  When asked to use his stomach to lean forward and "butt your head into my stomach" he generated a weak forward moment.                                     Pertinent Vitals/Pain Pain Assessment:  No/denies pain    Home Living Family/patient expects to be discharged to:: Skilled nursing facility                 Additional Comments: He reports conflicting information.  It appears he is lifted to a w/c,  ADL's are mostly done for him.  Per pt, he has had and is still receiving PT.    Prior Function Level of Independence: Needs assistance               Hand Dominance   Dominant Hand: Right    Extremity/Trunk Assessment   Upper Extremity Assessment Upper Extremity Assessment: Generalized weakness    Lower Extremity Assessment Lower Extremity Assessment: Generalized weakness(tightness into mild hip flexion contracture bil)       Communication   Communication: Other (comment)(mumbles)  Cognition Arousal/Alertness: Awake/alert Behavior During Therapy: Flat affect Overall Cognitive Status: No family/caregiver present to determine baseline cognitive functioning                                        General Comments      Exercises     Assessment/Plan    PT Assessment All further  PT needs can be met in the next venue of care  PT Problem List Decreased strength;Decreased activity tolerance;Decreased balance;Decreased mobility;Decreased coordination;Cardiopulmonary status limiting activity       PT Treatment Interventions      PT Goals (Current goals can be found in the Care Plan section)  Acute Rehab PT Goals Patient Stated Goal: pt did not state any goals PT Goal Formulation: With patient    Frequency     Barriers to discharge        Co-evaluation               AM-PAC PT "6 Clicks" Mobility  Outcome Measure Help needed turning from your back to your side while in a flat bed without using bedrails?: Total Help needed moving from lying on your back to sitting on the side of a flat bed without using bedrails?: Total Help needed moving to and from a bed to a chair (including a wheelchair)?: Total Help needed standing up  from a chair using your arms (e.g., wheelchair or bedside chair)?: Total Help needed to walk in hospital room?: Total Help needed climbing 3-5 steps with a railing? : Total 6 Click Score: 6    End of Session   Activity Tolerance: Patient tolerated treatment well;Patient limited by fatigue Patient left: in bed;with call bell/phone within reach;with bed alarm set Nurse Communication: Mobility status PT Visit Diagnosis: Muscle weakness (generalized) (M62.81);Adult, failure to thrive (R62.7)    Time: 1225-8346 PT Time Calculation (min) (ACUTE ONLY): 32 min   Charges:   PT Evaluation $PT Eval High Complexity: 1 High PT Treatments $Therapeutic Activity: 8-22 mins        08/04/2018  Donnella Sham, PT Acute Rehabilitation Services [phone]  (pager) [phone]  (office)  Tessie Fass Cataleya Cristina 08/04/2018, 7:31 PM

## 2018-08-05 ENCOUNTER — Inpatient Hospital Stay (HOSPITAL_COMMUNITY): Payer: Medicare HMO

## 2018-08-05 DIAGNOSIS — I350 Nonrheumatic aortic (valve) stenosis: Secondary | ICD-10-CM

## 2018-08-05 DIAGNOSIS — D638 Anemia in other chronic diseases classified elsewhere: Secondary | ICD-10-CM

## 2018-08-05 LAB — CBC
HEMATOCRIT: 29.3 % — AB (ref 39.0–52.0)
Hemoglobin: 9.4 g/dL — ABNORMAL LOW (ref 13.0–17.0)
MCH: 28.1 pg (ref 26.0–34.0)
MCHC: 32.1 g/dL (ref 30.0–36.0)
MCV: 87.7 fL (ref 80.0–100.0)
Platelets: 128 10*3/uL — ABNORMAL LOW (ref 150–400)
RBC: 3.34 MIL/uL — AB (ref 4.22–5.81)
RDW: 15.3 % (ref 11.5–15.5)
WBC: 9 10*3/uL (ref 4.0–10.5)
nRBC: 0 % (ref 0.0–0.2)

## 2018-08-05 LAB — BASIC METABOLIC PANEL
Anion gap: 8 (ref 5–15)
BUN: 71 mg/dL — ABNORMAL HIGH (ref 8–23)
CHLORIDE: 122 mmol/L — AB (ref 98–111)
CO2: 16 mmol/L — ABNORMAL LOW (ref 22–32)
Calcium: 9.2 mg/dL (ref 8.9–10.3)
Creatinine, Ser: 2.09 mg/dL — ABNORMAL HIGH (ref 0.61–1.24)
GFR calc Af Amer: 37 mL/min — ABNORMAL LOW (ref >60)
GFR calc non Af Amer: 32 mL/min — ABNORMAL LOW (ref >60)
Glucose, Bld: 155 mg/dL — ABNORMAL HIGH (ref 70–99)
Potassium: 4.5 mmol/L (ref 3.5–5.1)
Sodium: 146 mmol/L — ABNORMAL HIGH (ref 135–145)

## 2018-08-05 LAB — ECHOCARDIOGRAM COMPLETE: Weight: 2306.89 oz

## 2018-08-05 LAB — PROCALCITONIN: Procalcitonin: 4.45 ng/mL

## 2018-08-05 LAB — GLUCOSE, CAPILLARY
Glucose-Capillary: 151 mg/dL — ABNORMAL HIGH (ref 70–99)
Glucose-Capillary: 136 mg/dL — ABNORMAL HIGH (ref 70–99)
Glucose-Capillary: 165 mg/dL — ABNORMAL HIGH (ref 70–99)
Glucose-Capillary: 124 mg/dL — ABNORMAL HIGH (ref 70–99)

## 2018-08-05 MED ORDER — IPRATROPIUM-ALBUTEROL 0.5-2.5 (3) MG/3ML IN SOLN
3.0000 mL | Freq: Three times a day (TID) | RESPIRATORY_TRACT | Status: DC
  Administered 2018-08-05 – 2018-08-08 (×9): 3 mL via RESPIRATORY_TRACT
  Filled 2018-08-05: qty 3
  Filled 2018-08-05: qty 39
  Filled 2018-08-05 (×9): qty 3

## 2018-08-05 MED ORDER — PERFLUTREN LIPID MICROSPHERE
1.0000 mL | INTRAVENOUS | Status: AC | PRN
  Administered 2018-08-05: 2 mL via INTRAVENOUS
  Filled 2018-08-05: qty 10

## 2018-08-05 NOTE — Progress Notes (Signed)
  Date: 08/05/2018  Patient name: Jordan Gardner  Medical record number: [id number]  Date of birth: 03/16/1951   I have seen and evaluated this patient and I have discussed the plan of care with the house staff. Please see Dr. Marigene EhlersLee's note for complete details. I concur with his findings.  [name], Emily B, MD 08/05/2018, 8:15 PM

## 2018-08-05 NOTE — Progress Notes (Signed)
Subjective:  Jordan Gardner is a 67 y.o. with PMH of T2DM, HTN, HLD, PVD S/P bilateral AKA, CAD S/P CABG, CVA W left-sided weakness,HFrEF, and NSTEMI  admit for pneumonia on hospital day 4  Jordan Gardner was examined and evaluated at bedside this AM. He was observed trying to spit into a cup. He states he was coughing in the morning and endorsing productive sputum. He states his breathing is fine. He denies any F/N/V/D/C. Denies any chest pain, palpitations, dyspnea.  Objective:  Vital signs in last 24 hours: Vitals:   08/04/18 2038 08/04/18 2043 08/04/18 2147 08/05/18 0430  BP:  (!) 158/88  (!) 151/83  Pulse:  98  91  Resp:  18  19  Temp:  97.9 F (36.6 C)  97.9 F (36.6 C)  TempSrc:  Oral  Oral  SpO2: 98% 100% 99% 100%  Weight:       Physical Exam  Constitutional: He is oriented to person, place, and time and well-developed, well-nourished, and in no distress. No distress.  HENT:  Head: Normocephalic and atraumatic.  Mouth/Throat: Oropharynx is clear and moist.  Eyes: Pupils are equal, round, and reactive to light. Conjunctivae and EOM are normal. No scleral icterus.  Neck: Normal range of motion. Neck supple.  Cardiovascular: Normal rate, regular rhythm, normal heart sounds and intact distal pulses.  Pulmonary/Chest: Effort normal. No respiratory distress.  Diffuse coarse breath sounds  Abdominal: Soft. Bowel sounds are normal.  Musculoskeletal: Normal range of motion.        General: Deformity (bilateral AKA) present.  Neurological: He is alert and oriented to person, place, and time. GCS score is 15.  Skin: Skin is warm and dry. He is not diaphoretic.   Assessment/Plan:  Active Problems:   Sepsis with acute respiratory failure without septic shock (HCC)   Pneumonia of both lower lobes due to infectious organism [facility](HCC)   [name] is a 67 year old M with PMH of T2DM, HTN, HLD, PVD S/P bilateral AKA, CAD S/P CABG, CVA W left-sided weakness,HFrEF, and  NSTEMIadmitted for sepsis.His labs all show improvement and he may be close to discharge. His Echo showed worsening of his EF from 50 to 40-45 which may explain his respiratory symptoms after getting large volume of fluids on presentation. His pro-calcitonin is declining but has not yet reached normal levels so he will need continued abx therapy. Unfortunately no culture data to decide if an oral abx regimen can be chosen.  Sepsis 2/2aspiration vspneumonia Wbc 14.3->11->9.9->9.0. Afebrile. Vitals stable. Procalcitonin trend 12.47 on admit to 4.45. O2 sat 99 on 2L - C/w ceftriaxone (day 5) - Wean to room air (keep sat >88)  Resting Tremors, Dysphagia, Weakness 2/2 Medication effect vs ?ALS Vitamin B1 still pending. Possibly medication induced - Stop trazodone - Neuro F/u as outpatient  Normocytic anemia 2/2 chronic disease Hemoglobin stable around 9.4-9.9. - Monitor CBC  Chronic Systolic Heart Failure Echo this am EF 40-45% and worsening of wall motion abnormalities - I/O - Daily weights -C/w metoprolol 25mg  BID, imdur 60mg  daily  AKI 2/2 dehydration Baseline creatine 1.2 Creatine down trending 3.17->2.83->2.44 ->2.1 w/ oral intake - Trend BMP  T2DM Fasting 155 this am - Appreciate diabetes coordinator recs - C/w Lantus 10 units qhs - SSI - Glucose checks  HTN - C/w metoprolol, imdur - Holding lisinopril for AKI  Depression/Anxiety On trazodone 50mg  qhs, zoloft 200mg  daily, Xanax 0.25mg  qhs -C/w home meds: sertraline 200mg  daily  DVT prophx:subqheparin Diet:Dysphagia 1 per SSP Bowel:Senokot Code:DNR  Dispo: Anticipated discharge in approximately 1-3 day(s).   Theotis BarrioLee, Leanah Kolander K, MD 08/05/2018, 6:36 AM Pager: [phone]

## 2018-08-05 NOTE — Progress Notes (Signed)
Echocardiogram 2D Echocardiogram has been performed.  Jordan PartridgeBrooke S Zykiria Bruening 08/05/2018, 10:22 AM

## 2018-08-06 LAB — BASIC METABOLIC PANEL
ANION GAP: 9 (ref 5–15)
BUN: 60 mg/dL — ABNORMAL HIGH (ref 8–23)
CO2: 17 mmol/L — ABNORMAL LOW (ref 22–32)
Calcium: 8.9 mg/dL (ref 8.9–10.3)
Chloride: 118 mmol/L — ABNORMAL HIGH (ref 98–111)
Creatinine, Ser: 2.02 mg/dL — ABNORMAL HIGH (ref 0.61–1.24)
GFR calc Af Amer: 39 mL/min — ABNORMAL LOW (ref >60)
GFR calc non Af Amer: 33 mL/min — ABNORMAL LOW (ref >60)
Glucose, Bld: 97 mg/dL (ref 70–99)
Potassium: 4.5 mmol/L (ref 3.5–5.1)
SODIUM: 144 mmol/L (ref 135–145)

## 2018-08-06 LAB — GLUCOSE, CAPILLARY
GLUCOSE-CAPILLARY: 96 mg/dL (ref 70–99)
Glucose-Capillary: 160 mg/dL — ABNORMAL HIGH (ref 70–99)
Glucose-Capillary: 190 mg/dL — ABNORMAL HIGH (ref 70–99)
Glucose-Capillary: 155 mg/dL — ABNORMAL HIGH (ref 70–99)

## 2018-08-06 LAB — CULTURE, BLOOD (ROUTINE X 2)
Culture: NO GROWTH
Culture: NO GROWTH
Special Requests: ADEQUATE

## 2018-08-06 LAB — CBC
HCT: 28.7 % — ABNORMAL LOW (ref 39.0–52.0)
Hemoglobin: 8.9 g/dL — ABNORMAL LOW (ref 13.0–17.0)
MCH: 27.4 pg (ref 26.0–34.0)
MCHC: 31 g/dL (ref 30.0–36.0)
MCV: 88.3 fL (ref 80.0–100.0)
Platelets: 138 10*3/uL — ABNORMAL LOW (ref 150–400)
RBC: 3.25 MIL/uL — ABNORMAL LOW (ref 4.22–5.81)
RDW: 15.2 % (ref 11.5–15.5)
WBC: 10.3 10*3/uL (ref 4.0–10.5)
nRBC: 0.3 % — ABNORMAL HIGH (ref 0.0–0.2)

## 2018-08-06 MED ORDER — VITAMIN B-1 100 MG PO TABS
100.0000 mg | ORAL_TABLET | Freq: Every day | ORAL | Status: DC
  Administered 2018-08-07 – 2018-08-09 (×3): 100 mg via ORAL
  Filled 2018-08-06 (×3): qty 1

## 2018-08-06 NOTE — Progress Notes (Signed)
Subjective:  Jordan Gardner is a 67 y.o. with PMH of T2DM, HTN, HLD, PVD S/P bilateral AKA, CAD S/P CABG, CVA W left-sided weakness,HFrEF, and NSTEMI  admit for pneumonia on hospital day 5  [name] was examined and evaluated at bedside this AM. He was observed laying in bed comfortably with occasional dry coughs. He states that he wants to get off thick liquids because he wants some ice cold water. Explained to him about the importance of avoiding aspiration as this may have brought on his pneumonia in the first place. Discussed possibility of returning to thin liquids after speech and swallow eval today. Denies any chest pain, palpitations, nausea, vomiting.   Objective:  Vital signs in last 24 hours: Vitals:   08/06/18 0839 08/06/18 1300 08/06/18 1411 08/06/18 1442  BP:    138/81  Pulse: 88   86  Resp: 18   20  Temp:    98.9 F (37.2 C)  TempSrc:    Oral  SpO2: 95% 96% 96% 98%  Weight:       Physical Exam  Constitutional: He is oriented to person, place, and time and well-developed, well-nourished, and in no distress. No distress.  Chronically ill-appearing  HENT:  Head: Normocephalic and atraumatic.  Mouth/Throat: Oropharynx is clear and moist.  Eyes: Pupils are equal, round, and reactive to light. Conjunctivae and EOM are normal. No scleral icterus.  Neck: Normal range of motion. Neck supple.  Cardiovascular: Normal rate, regular rhythm, normal heart sounds and intact distal pulses.  Pulmonary/Chest: Effort normal. He has rales (bilateral lower lobe rales).  Diffuse coarse breath sounds  Abdominal: Soft. Bowel sounds are normal.  Musculoskeletal: Normal range of motion.        General: Deformity (bilateral AKA) present.  Neurological: He is alert and oriented to person, place, and time. GCS score is 15.  Skin: Skin is warm and dry. He is not diaphoretic.  2cm superficial pressure ulcer covered with Gerhardt's butt cream   Assessment/Plan:  Active Problems:  Sepsis with acute respiratory failure without septic shock (HCC)   Pneumonia of both lower lobes due to infectious organism [facility](HCC)  [name] is a 67 year old M with PMH of T2DM, HTN, HLD, PVD S/P bilateral AKA, CAD S/P CABG, CVA W left-sided weakness,HFrEF, and NSTEMIadmitted for sepsis.Continuing to improve on ceftriaxone. Notably his AKI is improving with oral repletion and his leukocytosis has resolved. He is working with speech and swallow for improvement in his dysphagia but he is safe to return to SNF. Awaiting social work input on returning back to his prior SNF.   Sepsis 2/2aspiration vspneumonia Wbc 14.3->11->9.9->9.0->10.3. Afebrile. Vitals stable. Procalcitonin trend 12.47 on admit to 4.45. O2 sat 99 on 2L - C/w ceftriaxone (day 6/8) can switch to oral if placement complete prior to day 8 - Wean to room air (keep sat >88)  Resting Tremors, Dysphagia, Weakness 2/2 Medication effect vs ?ALS Tremors appear to be improving but continuing to endorse significant dysphagia. Vitamin B1 level still pending. Suspicious inciting medications stopped. - Neuro F/u as outpatient  Normocytic anemia 2/2 chronic disease Hemoglobin stable around 9.4-9.9. - Monitor CBC  Chronic Systolic Heart Failure Echo show EF 40-45% and worsening of wall motion abnormalities compared to prior.  - I/O - Daily weights -C/w metoprolol 25mg  BID, imdur 60mg  daily  AKI 2/2 dehydration Baseline creatine 1.2 Creatine down trending 3.17->2.83->2.44 ->2.1->2.0 w/ oral intake - Trend BMP  T2DM Fasting 96 this am - Appreciate diabetes coordinator recs - C/w Lantus  10 units qhs - SSI - Glucose checks  HTN - C/w metoprolol, imdur - Holding lisinopril for AKI  Depression/Anxiety On trazodone 50mg  qhs, zoloft 200mg  daily, Xanax 0.25mg  qhs -C/w home meds: sertraline 200mg  daily  DVT prophx:subqheparin Diet:Dysphagia 1 per SSP Bowel:Senokot Code:DNR  Dispo: Anticipated discharge in  approximately 1-3 day(s).   Theotis BarrioLee, Leanny Moeckel K, MD 08/06/2018, 4:50 PM Pager: [phone]

## 2018-08-06 NOTE — Progress Notes (Signed)
  Date: 08/06/2018  Patient name: Jordan Gardner  Medical record number: [id number]  Date of birth: 11/30/1950   I have seen and evaluated this patient and I have discussed the plan of care with the house staff. Please see Dr. Marigene EhlersLee's note for complete details. I concur with his findings. He would like to start having thin liquids again, we will ask our SLP colleagues to come and re-evaluate him and reiterate risk of this option.   [name], [name], MD 08/06/2018, 7:05 PM

## 2018-08-06 NOTE — Progress Notes (Signed)
  Speech Language Pathology Treatment: Dysphagia  Patient Details Name: Jordan Gardner MRN: [id number] DOB: 09/25/1950 Today's Date: 08/06/2018 Time: [id number] SLP Time Calculation (min) (ACUTE ONLY): 21 min  Assessment / Plan / Recommendation Clinical Impression  SLP followed up during PO lunch meal for diet tolerance and upgraded PO trials. Pt continues to exhibit overt difficulty with small isolated cup sips of thin liquids c/b immediate coughing, concerning for reduced airway protection. Nectar thick liquids and puree consistencies were without overt s/sx of aspiration. Oral deficits persist including delayed AP transit and reduced bolus cohesion with oral residuals noted. Continue dysphagia 2 and nectar thick liquids with safe swallow precautions.     HPI HPI: [name] is a 67 year old M with PMH of type 2 diabetes, HLD, HTN, CVA w/ residual left-sided weakness, PVD, CAD status post CABG (2012), and NSTEMI (04/2017) presenting with respiratory distress, AMS, lethargy and fever. Chest x-ray shows mild bibasilar subsegmental atelectasis or infiltrates.       SLP Plan  Continue with current plan of care       Recommendations  Diet recommendations: Dysphagia 2 (fine chop);Nectar-thick liquid Liquids provided via: Cup;No straw Medication Administration: Crushed with puree(whole in puree as tolerated) Supervision: Staff to assist with self feeding;Full supervision/cueing for compensatory strategies Compensations: Slow rate;Small sips/bites;Follow solids with liquid Postural Changes and/or Swallow Maneuvers: Seated upright 90 degrees                Oral Care Recommendations: Oral care BID Follow up Recommendations: Skilled Nursing facility SLP Visit Diagnosis: Dysphagia, oropharyngeal phase (R13.12) Plan: Continue with current plan of care       GO                Jordan Gardner E Jordan Cunningham MA, CCC-SLP 08/06/2018, 2:02 PM

## 2018-08-06 NOTE — Clinical Social Work Note (Signed)
Clinical Social Work Assessment  Patient Details  Name: Jordan Gardner MRN: [id number] Date of Birth: 08/24/1950  Date of referral:  08/06/18               Reason for consult:  Facility Placement                Permission sought to share information with:  Facility Industrial/product designerContact Representative Permission granted to share information::  Yes, Verbal Permission Granted  Name::     Ambulance personKatina  Agency::  [facility]  Relationship::  Sister  Contact Information:  [phone]  Housing/Transportation Living arrangements for the past 2 months:  Skilled Nursing Facility Source of Information:  Siblings Patient Interpreter Needed:  None Criminal Activity/Legal Involvement Pertinent to Current Situation/Hospitalization:  No - Comment as needed Significant Relationships:  Siblings Lives with:  Facility Resident Do you feel safe going back to the place where you live?  Yes Need for family participation in patient care:  Yes (Comment)  Care giving concerns:  CSW received consult for possible SNF placement at time of discharge. CSW spoke with patient's sister regarding PT recommendation of SNF placement at time of discharge. Patient's sister reported that patient has been at [facility]. Patient's sister expressed understanding of PT recommendation and is agreeable to SNF placement at time of discharge. CSW to continue to follow and assist with discharge planning needs.   Social Worker assessment / plan:  CSW spoke with patient's sister concerning return to [facility] at discharge.   Employment status:  Retired Database administratornsurance information:  Managed Medicare PT Recommendations:  Not assessed at this time Information / Referral to community resources:  Skilled Nursing Facility  Patient/Family's Response to care:  Patient's sister reports that [facility] but she is concerned about money. Per [facility], patient is pending Medicaid and they can accept him back if the  patient's sister contacts their billing department.   Patient/Family's Understanding of and Emotional Response to Diagnosis, Current Treatment, and Prognosis:  Patient/family is realistic regarding therapy needs and expressed being hopeful for return to SNF placement. Patient's daughter expressed understanding of CSW role and discharge process as well as medical condition. No questions/concerns about plan or treatment.    Emotional Assessment Appearance:  Appears stated age Attitude/Demeanor/Rapport:  Unable to Assess Affect (typically observed):  Unable to Assess Orientation:  Oriented to Self, Oriented to Place Alcohol / Substance use:  Not Applicable Psych involvement (Current and /or in the community):  No (Comment)  Discharge Needs  Concerns to be addressed:  Care Coordination Readmission within the last 30 days:  No Current discharge risk:  Dependent with Mobility Barriers to Discharge:  Continued Medical Work up   Jordan Micro Incadia S Mairin Lindsley, LCSW 08/06/2018, 3:27 PM

## 2018-08-07 LAB — BASIC METABOLIC PANEL
Anion gap: 7 (ref 5–15)
BUN: 50 mg/dL — ABNORMAL HIGH (ref 8–23)
CO2: 18 mmol/L — ABNORMAL LOW (ref 22–32)
Calcium: 8.5 mg/dL — ABNORMAL LOW (ref 8.9–10.3)
Chloride: 116 mmol/L — ABNORMAL HIGH (ref 98–111)
Creatinine, Ser: 1.75 mg/dL — ABNORMAL HIGH (ref 0.61–1.24)
GFR calc Af Amer: 46 mL/min — ABNORMAL LOW (ref >60)
GFR calc non Af Amer: 40 mL/min — ABNORMAL LOW (ref >60)
Glucose, Bld: 164 mg/dL — ABNORMAL HIGH (ref 70–99)
Potassium: 4.2 mmol/L (ref 3.5–5.1)
Sodium: 141 mmol/L (ref 135–145)

## 2018-08-07 LAB — GLUCOSE, CAPILLARY
Glucose-Capillary: 110 mg/dL — ABNORMAL HIGH (ref 70–99)
Glucose-Capillary: 173 mg/dL — ABNORMAL HIGH (ref 70–99)
Glucose-Capillary: 91 mg/dL (ref 70–99)
Glucose-Capillary: 142 mg/dL — ABNORMAL HIGH (ref 70–99)

## 2018-08-07 NOTE — Progress Notes (Signed)
  Date: 08/07/2018  Patient name: Jordan Gardner  Medical record number: [id number]  Date of birth: 02/18/1951   This patient's plan of care was discussed with the house staff. Please see Dr. Cannon Kettlehundi's note for complete details. I concur with her findings.   [name], Lavert Matousek B, MD 08/07/2018, 9:59 PM

## 2018-08-07 NOTE — Progress Notes (Signed)
   Subjective:  [name] was seen resting in his bed this morning. He denied any chest pain, dyspnea. He mentioned that he felt that his left lower extremity was more swollen than his right. He continues to express that he wants to drink water and thin liquids.  Objective:  Vital signs in last 24 hours: Vitals:   08/06/18 2243 08/06/18 2248 08/07/18 0608 08/07/18 0812  BP: (!) 147/73  (!) 144/67   Pulse: 81   80  Resp:   20 18  Temp: 99.8 F (37.7 C)  99 F (37.2 C)   TempSrc: Oral  Oral   SpO2: 100% 97% 97% 96%  Weight:       Physical Exam  Constitutional: He appears well-developed and well-nourished. No distress.  HENT:  Head: Normocephalic and atraumatic.  Cardiovascular: Normal rate, regular rhythm and normal heart sounds.  Respiratory: Effort normal and breath sounds normal. No respiratory distress. He has no wheezes.  GI: Soft. Bowel sounds are normal. He exhibits no distension. There is abdominal tenderness (epigastric region).  Musculoskeletal:        General: No edema (bilateral aka sites appear symmetric).  Neurological: He is alert.  Skin: He is not diaphoretic. No erythema.  Psychiatric: He has a normal mood and affect. His behavior is normal. Judgment and thought content normal.   Assessment/Plan:  [name] is a 67 y.o male with CAD s/p cabg, hfref, DM2, hld, htn, pvd s/p aka who presented with sepsis secondary to aspiration pneumonia.   Sepsis 2/2 Community acquired pneumonia  Patient continues to remain afebrile.  He is to complete a day course of ceftriaxone.  -Continue ceftriaxone, day 7/8  AKI Patient's baseline creatinine is around 1.2-1.3.  Patient's creatinine is 1.7 today.  Dysphagia and fasciculations Evaluated by SLP on 12/20 who felt that the patient had difficulty with swallowing thin liquids. Patient expresses that he understands the risk of aspiration and he is persistent about wanting to consume a thin liquid diet.   -Continue  dysphagia 2 and nectar thick liquids  CAD s/p CABG  -Continue atorvastatin 80 mg daily -Continue Imdur 60 mg daily -Continue metoprolol 25 mg twice daily  Diabetes Mellitus Type 2 The patient's blood glucose levels have ranged 150-160s over the past 24 hrs.   -Continue glargine 10 units -Continue SSI  Dispo: Anticipated discharge in approximately 0-1 day(s). MEDICALLY STABLE FOR DISCHARGE TO SNF  Jordan Gardner, Jordan Trela, MD 08/07/2018, 8:34 AM Pager: [phone]

## 2018-08-07 NOTE — Discharge Summary (Addendum)
Name: Jordan Gardner MRN: [id number] DOB: 07/30/1951 67 y.o. [facility]  Date of Admission: 08/01/2018  6:41 AM Date of Discharge: 08/09/2018 Attending Physician: Anne Shutteraines,  N, MD  Discharge Diagnosis: 1. Community Acquired Pneumonia with Sepsis 2. Dysphagia 3. Chronic systolic heart failure 4. Acute Kidney Injury 5. Type 2 Diabetes Mellitus  Discharge Medications: Allergies as of 08/09/2018   No Known Allergies     Medication List    STOP taking these medications   mirtazapine 30 MG tablet Commonly known as:  REMERON   nicotine 14 mg/24hr patch Commonly known as:  NICODERM CQ - dosed in mg/24 hours   traZODone 50 MG tablet Commonly known as:  DESYREL   UNABLE TO FIND     TAKE these medications   ALPRAZolam 0.25 MG tablet Commonly known as:  XANAX Take 0.25 mg by mouth at bedtime.   aspirin 81 MG EC tablet Take 1 tablet (81 mg total) by mouth daily.   aspirin-acetaminophen-caffeine 250-250-65 MG tablet Commonly known as:  EXCEDRIN MIGRAINE Take 2 tablets by mouth every 8 (eight) hours as needed for headache.   atorvastatin 80 MG tablet Commonly known as:  LIPITOR Take 80 mg by mouth daily at 6 PM.   CHLORASEPTIC SORE THROAT 6-10 MG lozenge Generic drug:  benzocaine-menthol Take 1 lozenge by mouth every 2 (two) hours as needed for sore throat.   docusate sodium 100 MG capsule Commonly known as:  COLACE Take 100 mg by mouth daily as needed for mild constipation.   feeding supplement (GLUCERNA SHAKE) Liqd Take 237 mLs by mouth 2 (two) times daily between meals.   feeding supplement (PRO-STAT SUGAR FREE 64) Liqd Take 30 mLs by mouth 2 (two) times daily.   furosemide 20 MG tablet Commonly known as:  LASIX Take 20 mg by mouth daily. What changed:  Another medication with the same name was removed. Continue taking this medication, and follow the directions you see here.   gabapentin 300 MG capsule Commonly known as:   NEURONTIN Take 1 capsule (300 mg total) by mouth 3 (three) times daily.   isosorbide mononitrate 60 MG 24 hr tablet Commonly known as:  IMDUR Take 60 mg by mouth daily. What changed:  Another medication with the same name was removed. Continue taking this medication, and follow the directions you see here.   lisinopril 2.5 MG tablet Commonly known as:  PRINIVIL,ZESTRIL Take 1 tablet (2.5 mg total) daily by mouth.   loperamide 2 MG tablet Commonly known as:  IMODIUM A-D Take 2 mg by mouth as needed for diarrhea or loose stools.   megestrol 20 MG tablet Commonly known as:  MEGACE Take 20 mg by mouth daily.   metFORMIN 1000 MG tablet Commonly known as:  GLUCOPHAGE Take 1,000 mg by mouth 2 (two) times daily with a meal.   metoprolol tartrate 50 MG tablet Commonly known as:  LOPRESSOR Take 0.5 tablets (25 mg total) by mouth 2 (two) times daily.   multivitamin with minerals Tabs tablet Take 1 tablet by mouth daily.   nitroGLYCERIN 0.4 MG SL tablet Commonly known as:  NITROSTAT Place 1 tablet (0.4 mg total) under the tongue every 5 (five) minutes x 3 doses as needed for chest pain.   ondansetron 4 MG tablet Commonly known as:  ZOFRAN Take 4 mg by mouth every 6 (six) hours as needed for nausea or vomiting.   oxyCODONE-acetaminophen 5-325 MG tablet Commonly known as:  PERCOCET/ROXICET Take 1 tablet by mouth every 4 (  four) hours as needed for severe pain. What changed:  how much to take   sertraline 100 MG tablet Commonly known as:  ZOLOFT Take 200 mg by mouth daily. DEPRESSION/ANXIETY   Vitamin D (Ergocalciferol) 1.25 MG (50000 UT) Caps capsule Commonly known as:  DRISDOL Take 50,000 Units by mouth every Friday.       Disposition and follow-up:   Jordan Gardner was discharged from [facility] in Stable condition.  At the hospital follow up visit please address:  1. Community Acquired Pneumonia with Sepsis - Presented with fever, nausea,  diarrhea and lower lobe infiltrates on chest x-ray - Confirmed pneumonia w/ elevated pro-calcitonin - Treated w/ 8-day course of ceftriaxone - Consider repeating chest X-ray to ensure resolution of pneumonia  2. Dysphagia and fasciculations  - Noted to have fasciculations and bulbar signs and dysphagia - Concerning for upper motor neuron disease - Ensure he has follow up with neurology outpatient for EMG  3. Chronic systolic heart failure - Noted to have prior TTE showing EF of 50% - Had elevated BNP on admission - Repeat TTE performed during hospitalization showing EF down to 40-45% - Ensure he has follow up with cardiology outpatient for worsening cardiac function  4. Acute Kidney Injury - Admit with creatinine of 3.17 from baseline of ~1.3 - Trended down to 1.6 with fluids - Lisinopril was held during hospitalization, but restarted on discharge - Ensure his creatinine has returned to baseline  5. Type 2 Diabetes Mellitus - Metformin held during inpatient  - Noted to have hyperglycemia during admission with hemoglobin a1c of 8 - Started on Lantus 10 units qhs with stable fasting bg during hospitalization. Resumed metformin on discharge plus Lantus 6u qhs. - Consider adjusting diabetic regimen for optimal glucose control  6. Depression - Sertraline was continued on admission, but remeron and trazadone were held. Patient did not require these medications during hospitalization.  - Consider adding them back on if necessary.  7.  Labs / imaging needed at time of follow-up: Chest x-ray, CBC, BMP  8.  Pending labs/ test needing follow-up: none  Follow-up Appointments: Follow-up Information    [facility] .   Contact information: [address] [facility] [place] [address] [phone]        Lennette Bihari, MD .   Specialty:  Cardiology Contact information: [address] [place] [address] [phone]        [facility]. Schedule an appointment as soon as possible for a visit in 1 week(s).   Specialty:  Neurology Contact information: [address] [id number] mc [place] [address] [phone]          Hospital Course by problem list: Community Acquired Pneumonia with Sepsis Presented from SNF w/ respiratory distress, altered mental status, nausea, vomiting and diarrhea. Chest X-ray showed lower lobe infiltrates. Blood culture collected. Pro-calcitonin at 12.47. Started on Vanc and cefepime. Abx regimen adjusted to vanc and ceftriaxone. Pro-calcitonin trended down to 4.45. Vancomycin stopped after day 5. Ceftriaxone continued for 8 day treatment. Discharged to SNF.  Dysphagia, Fasciculations, Upper extremity weakness Noted to have fasciculations and upper extremity weakness on exam. Speech therapist evaluation reveal pharyngeal dysphagia. Placed on thick nectar fluid. Concerns for upper motor neuron prompted neurology recommendations. Discharged w/ recommendation to follow up with neurology outpatient for EMG.  Chronic systolic heart failure BNP 594.2 on admission. Prior echo showed EF of 50% w/ grade 1 diastolic dysfunction. Fluid boluses given for sepsis  and showed increasing lower extremity edema and oxygen requirements. Repeat Echo showed worsening EF of 40-45%. Discharged with recommendation to follow up with cardiology   Acute Kidney Injury Presented with elevated creatinine at 3.17 from prior baseline of 1.3. IV fluid resuscitation showed improvement to 2.5. Allowed to replenish by oral intake and showed down trending creatinine to 1.6.   Type 2 Diabetes Mellitus On metformin 1000mg  at home. Presented w/ glucose >200. Hemoglobin a1c resulted at 8.0. Started on Lantus 10 units qhs with sliding scale insulin. Fasting glucose of 100-150 with insulin aspart requirement of 1-3 units per day. Discharged with recommendation to follow up with PCP for continued  diabetes management.  Discharge Vitals:   BP (!) 154/88 (BP Location: Left Arm)   Pulse 73   Temp 98.9 F (37.2 C)   Resp 18   Ht 6' 0.99" (1.854 m)   Wt 65.2 kg   SpO2 99%   BMI 18.97 kg/m   Pertinent Labs, Studies, and Procedures:  CBC Latest Ref Rng & Units 08/06/2018 08/05/2018 08/04/2018  WBC 4.0 - 10.5 K/uL 10.3 9.0 9.9  Hemoglobin 13.0 - 17.0 g/dL 4.0(J8.9(L) 8.1(X9.4(L) 9.1(Y9.9(L)  Hematocrit 39.0 - 52.0 % 28.7(L) 29.3(L) 31.8(L)  Platelets 150 - 400 K/uL 138(L) 128(L) 137(L)    BMP Latest Ref Rng & Units 08/08/2018 08/07/2018 08/06/2018  Glucose 70 - 99 mg/dL 782(N117(H) 562(Z164(H) 97  BUN 8 - 23 mg/dL 30(Q41(H) 65(H50(H) 84(O60(H)  Creatinine 0.61 - 1.24 mg/dL 9.62(X1.62(H) 5.28(U1.75(H) 1.32(G2.02(H)  Sodium 135 - 145 mmol/L 140 141 144  Potassium 3.5 - 5.1 mmol/L 4.1 4.2 4.5  Chloride 98 - 111 mmol/L 118(H) 116(H) 118(H)  CO2 22 - 32 mmol/L 16(L) 18(L) 17(L)  Calcium 8.9 - 10.3 mg/dL 4.0(N8.4(L) 0.2(V8.5(L) 8.9     Discharge Instructions: Discharge Instructions    Diet - low sodium heart healthy   Complete by:  As directed    Discharge instructions   Complete by:  As directed    It was a pleasure taking care of you, Jordan Gardner!  1. You were hospitalized for a severe pneumonia infection. You improved with IV antibiotics and fluids. Your infection has resolved and you are safe for discharge back to a skilled nursing facility.   2. You should follow-up with a neurologist in the outpatient setting for your decreased strength and sensation in your right arm. I have provided the number for a local neurologist. Please call them and set up an appointment for their earliest available.  3. We have been holding your lisinopril (a blood pressure medication) while you've been in the hospital because your kidney function was impaired. Your kidney function has improved and you can restart this medication when you leave. Your kidney function should be reassessed at your nursing facility.   Feel free to call our clinic at [phone]  if you have any questions.  Thanks, Dr. Avie Arenasorrell   Increase activity slowly   Complete by:  As directed       Signed: Dorrell, Cathleen Cortieborah N, MD 08/09/2018, 2:53 PM   Pager: [phone]   Internal Medicine Attending Note:  I saw and examined the patient on the day of discharge. I reviewed and agree with the discharge summary written by the house staff.  Jessy OtoAlexander , M.D., Ph.D.

## 2018-08-08 LAB — VITAMIN B1: Vitamin B1 (Thiamine): 426.2 nmol/L — ABNORMAL HIGH (ref 66.5–200.0)

## 2018-08-08 LAB — GLUCOSE, CAPILLARY
Glucose-Capillary: 118 mg/dL — ABNORMAL HIGH (ref 70–99)
Glucose-Capillary: 165 mg/dL — ABNORMAL HIGH (ref 70–99)
Glucose-Capillary: 130 mg/dL — ABNORMAL HIGH (ref 70–99)
Glucose-Capillary: 141 mg/dL — ABNORMAL HIGH (ref 70–99)

## 2018-08-08 LAB — BASIC METABOLIC PANEL
Anion gap: 6 (ref 5–15)
BUN: 41 mg/dL — ABNORMAL HIGH (ref 8–23)
CO2: 16 mmol/L — ABNORMAL LOW (ref 22–32)
Calcium: 8.4 mg/dL — ABNORMAL LOW (ref 8.9–10.3)
Chloride: 118 mmol/L — ABNORMAL HIGH (ref 98–111)
Creatinine, Ser: 1.62 mg/dL — ABNORMAL HIGH (ref 0.61–1.24)
GFR calc non Af Amer: 44 mL/min — ABNORMAL LOW (ref >60)
GFR, EST AFRICAN AMERICAN: 51 mL/min — AB (ref >60)
Glucose, Bld: 117 mg/dL — ABNORMAL HIGH (ref 70–99)
Potassium: 4.1 mmol/L (ref 3.5–5.1)
Sodium: 140 mmol/L (ref 135–145)

## 2018-08-08 MED ORDER — IPRATROPIUM-ALBUTEROL 0.5-2.5 (3) MG/3ML IN SOLN: 3.0000 mL | RESPIRATORY_TRACT | Status: DC | PRN

## 2018-08-08 MED ORDER — IPRATROPIUM-ALBUTEROL 0.5-2.5 (3) MG/3ML IN SOLN
3.0000 mL | Freq: Two times a day (BID) | RESPIRATORY_TRACT | Status: DC
  Administered 2018-08-08: 3 mL via RESPIRATORY_TRACT
  Filled 2018-08-08: qty 3

## 2018-08-08 NOTE — Progress Notes (Addendum)
   Subjective:  [name] was seen resting in his bed this morning. He mentioned that he had abdominal pain that seems to be worst in his suprapubic region. He continues to insist that he wants "ice cold water"  Objective:  Vital signs in last 24 hours: Vitals:   08/07/18 1912 08/07/18 2034 08/08/18 0419 08/08/18 0500  BP:  (!) 149/71 (!) 149/78   Pulse:  88 77   Resp:  18 18   Temp:  98.2 F (36.8 C) 98.4 F (36.9 C)   TempSrc:  Oral Oral   SpO2: 95% 96% 99%   Weight:    67 kg   Physical Exam  Constitutional: He appears well-developed and well-nourished. No distress.  HENT:  Head: Normocephalic and atraumatic.  Eyes: Conjunctivae are normal.  Cardiovascular: Normal rate, regular rhythm and normal heart sounds.  Respiratory: Effort normal and breath sounds normal. No respiratory distress. He has no wheezes.  GI: Soft. Bowel sounds are normal. He exhibits no distension. There is abdominal tenderness (generalized but more prominent in suprapubic region).  Musculoskeletal:        General: No edema (bilateral aka sites without edema ).  Skin: He is not diaphoretic.  Psychiatric: He is slowed and withdrawn.   Assessment/Plan:  Active Problems:   Sepsis with acute respiratory failure without septic shock (HCC)   Pneumonia of both lower lobes due to infectious organism [facility](HCC) [name] is a 67 y.o male with CAD s/p cabg, hfref, DM2, hld, htn, pvd s/p aka who presented with sepsis secondary to aspiration pneumonia.   Sepsis 2/2 Community acquired pneumonia  Patient continues to remain afebrile.  He is to complete a day course of ceftriaxone today.   -Continue ceftriaxone, day 8/8  AKI Patient's baseline creatinine is around 1.2-1.3.  Patient's creatinine is continuing to improve and 1.62 today.   Dysphagia and fasciculations Evaluated by SLP on 12/20 who felt that the patient had difficulty with swallowing thin liquids. Patient expresses that he understands the risk of  aspiration and he is persistent about wanting to consume a thin liquid diet.   -Continue dysphagia 2 and nectar thick liquids -If patient insists he can have water sitting upright, without straw, using chin tuck  CAD s/p CABG  -Continue atorvastatin 80 mg daily -Continue Imdur 60 mg daily -Continue metoprolol 25 mg twice daily  Diabetes Mellitus Type 2 The patient's blood glucose levels have ranged 110s over the past 24 hrs.   -Continue glargine 10 units -Continue SSI  Dispo: MEDICALLY STABLE FOR DISCHARGE WHEN SNF AVAILABLE. Pt is medicaid pending which social work is working on.   Jordan Gardner, Jordan Desrosiers, MD 08/08/2018, 10:50 AM Pager: [phone]

## 2018-08-08 NOTE — Progress Notes (Signed)
  Date: 08/08/2018  Patient name: Jordan Gardner  Medical record number: [id number]  Date of birth: 09/16/1950   I have seen and evaluated this patient and I have discussed the plan of care with the house staff. Please see Dr. Cannon Kettlehundi's note for complete details. I concur with her findings with the following additions/corrections:   During our discussion, Jordan Gardner stated that the abdominal pain started on taking the thickened liquids and that he could "walk on" the liquids.  He adamantly refuses to take them.  He is willing to accept risk of thin liquids at this time.  He will need to have assistance with thin liquids and be sitting up, taking small sips, with no straw.  If his abdominal pain does not improve, would consider urine studies vs abdominal xray for possible constipation.    [name], Emily B, MD 08/08/2018, 2:54 PM

## 2018-08-08 NOTE — Progress Notes (Signed)
Pt in no distress to warrant bipap.  It is now PRN and RT will monitor.

## 2018-08-09 ENCOUNTER — Other Ambulatory Visit: Payer: Self-pay

## 2018-08-09 DIAGNOSIS — Z7984 Long term (current) use of oral hypoglycemic drugs: Secondary | ICD-10-CM

## 2018-08-09 DIAGNOSIS — R1313 Dysphagia, pharyngeal phase: Secondary | ICD-10-CM

## 2018-08-09 DIAGNOSIS — R1013 Epigastric pain: Secondary | ICD-10-CM

## 2018-08-09 DIAGNOSIS — J181 Lobar pneumonia, unspecified organism: Secondary | ICD-10-CM

## 2018-08-09 LAB — GLUCOSE, CAPILLARY
GLUCOSE-CAPILLARY: 128 mg/dL — AB (ref 70–99)
GLUCOSE-CAPILLARY: 89 mg/dL (ref 70–99)
GLUCOSE-CAPILLARY: 93 mg/dL (ref 70–99)

## 2018-08-09 LAB — URINALYSIS, ROUTINE W REFLEX MICROSCOPIC
Bilirubin Urine: NEGATIVE
Glucose, UA: NEGATIVE mg/dL
Hgb urine dipstick: NEGATIVE
Ketones, ur: NEGATIVE mg/dL
Leukocytes, UA: NEGATIVE
Nitrite: NEGATIVE
Protein, ur: NEGATIVE mg/dL
Specific Gravity, Urine: 1.015 (ref 1.005–1.030)
pH: 5.5 (ref 5.0–8.0)

## 2018-08-09 MED ORDER — ALUM & MAG HYDROXIDE-SIMETH 200-200-20 MG/5ML PO SUSP
30.0000 mL | Freq: Once | ORAL | Status: AC
  Administered 2018-08-09: 30 mL via ORAL
  Filled 2018-08-09: qty 30

## 2018-08-09 MED ORDER — INSULIN GLARGINE 100 UNIT/ML ~~LOC~~ SOLN: 5.0000 [IU] | Freq: Every day | SUBCUTANEOUS | 11 refills | Status: DC

## 2018-08-09 MED ORDER — NEPRO/CARBSTEADY PO LIQD
237.0000 mL | Freq: Three times a day (TID) | ORAL | Status: DC
  Administered 2018-08-09: 237 mL via ORAL

## 2018-08-09 MED ORDER — SENNA 8.6 MG PO TABS
1.0000 | ORAL_TABLET | Freq: Once | ORAL | Status: AC
  Administered 2018-08-09: 8.6 mg via ORAL
  Filled 2018-08-09: qty 1

## 2018-08-09 MED ORDER — LIDOCAINE VISCOUS HCL 2 % MT SOLN
15.0000 mL | Freq: Once | OROMUCOSAL | Status: AC
  Administered 2018-08-09: 15 mL via ORAL
  Filled 2018-08-09: qty 15

## 2018-08-09 NOTE — Progress Notes (Addendum)
Nutrition Follow-up  DOCUMENTATION CODES:   Severe malnutrition in context of chronic illness  INTERVENTION:   Nepro Shake po TID, each supplement provides 425 kcal and 19 grams protein   NUTRITION DIAGNOSIS:   Severe Malnutrition related to chronic illness as evidenced by percent weight loss, severe fat depletion, severe muscle depletion.  Ongoing   GOAL:   Patient will meet greater than or equal to 90% of their needs  Progressing  MONITOR:   PO intake, Supplement acceptance, Diet advancement, Weight trends, Labs, I & O's  REASON FOR ASSESSMENT:   Malnutrition Screening Tool    ASSESSMENT:   67 yo male, admitted with sepsis d/t pneumonia. PMH significant for T2DM, HLD, HTN, CVA with residual L-side weakness, PVD, CAD s/p CABG (2012) and NSTEMI (04/2017), previous dx of PCM. 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 Resides in nursing facility.  Pt alert and oriented in room when DI entered. Pt discussed not eating very well in the hospital so far and not having foods that he wants to eat. Pt familiar with ordering foods from menu. DI assisted pt with breakfast tray that was off to the side that he couldn't get to and encouraged pt im eating protein and making sure to drink supplements to help get stronger while in the hospital and heal his pressure injuries.   Pt reported not liking thickened supplements. Per SLP, pt on a dysphagia 2 diet with nectar thick. Pt compliant with trying nepro supplement in butter pecan flavor. Pt refused magic cups since he doesn't like ice cream.  Medications reviewed and include: Insulin sliding scale Lantus Thiamine 100 mg/d MVI Rocephin  Labs reviewed:  CBG: 128, 141, 130, 165,118 X 24 HgA1c: 8.0 12/17 BUN 41 (H)  Creatinine 1.61 (H)  Calcium 8.72 (L) adjusted with 3.6 albumin 12/15    Diet Order:   Diet Order            DIET DYS 2 Room service appropriate? Yes; Fluid consistency: Nectar Thick  Diet effective now              EDUCATION NEEDS:    No education needs have been identified at this time  Skin:  Skin Assessment: Skin Integrity Issues: Skin Integrity Issues:: Stage III, Unstageable Stage II: coccyx Unstageable: pressure injusry buttocks  Last BM:  12/22- Type 5  Height:   Ht Readings from Last 1 Encounters:  08/09/18 6' 0.99" (1.854 m)    Weight:   Wt Readings from Last 1 Encounters:  08/09/18 65.2 kg    Ideal Body Weight:  69.1 kg(BL AKA)  BMI:  Body mass index is 18.97 kg/m.  Estimated Nutritional Needs:   Kcal:  2073-2289 calories daily (30-35 kcal/kg IBW)  Protein:  104-118 gm daily (1.5-1.8 g/kg ABW)  Fluid:  >/= 2 L daily or per MD discretion    Bethena MidgetHannah Cove Haydon, MS, Dietetic Intern Pager: [phone] After hours Pager: [phone]

## 2018-08-09 NOTE — Plan of Care (Signed)

## 2018-08-09 NOTE — Progress Notes (Signed)
Pt prepared for d/c to SNF. IV d/c'd. Skin intact except as charted in most recent assessments. Vitals are stable. Report called to receiving facility. Pt to be transported by ambulance service. 

## 2018-08-09 NOTE — Progress Notes (Signed)
   Subjective: No overnight events. Pt reports that he continues to have abdominal pain that began yesterday. His last bowel movement was two days ago. He thinks he has decreased urine output. He has no other concerns today.  Objective:  Vital signs in last 24 hours: Vitals:   08/08/18 2115 08/08/18 2131 08/09/18 0255 08/09/18 0528  BP:  (!) 148/68  (!) 148/66  Pulse:  86  75  Resp:  16  12  Temp:  98.2 F (36.8 C)  98.8 F (37.1 C)  TempSrc:  Oral  Oral  SpO2: 98% 99%  100%  Weight:    65.2 kg  Height:   6' 0.99" (1.854 m)    Physical Exam Constitutional: Laying comfortably in bed. Cardiovascular: Normal rate and regular rhythm. No murmurs, rubs, or gallops. Pulmonary/Chest: Effort normal. Clear to auscultation bilaterally. No wheezes, rales, or rhonchi. Abdominal: Bowel sounds present. Soft, non-distended. Diffuse mild TTP, worst in the epigastric region. Ext: Bilateral AKAs. Skin: Warm and dry. No rashes or wounds.  Assessment/Plan:  Active Problems:   Sepsis with acute respiratory failure without septic shock (HCC)   Pneumonia of both lower lobes due to infectious organism [facility](HCC)  [name] is a 67 y.o male with CAD s/p CABG, HFrEF, DM2, HLD, HTN, PVD s/p bilateral AKA who presented with sepsis secondary to aspiration pneumonia.   Sepsis 2/2 Community acquired pneumonia - Patient continues to remain afebrile.He has completed an 8 day course of ceftriaxone. No further intervention Plan - Patient to be discharged today to SNF  AKI - Patient's baseline creatinine is around 1.2-1.3.Patient's creatinine has slowly improved over the hospitalization with his most recent Cr 1.62 on 12/22.   Plan - Will need to have kidney function reassessed at f/u  Abdominal Pain - DDx includes urinary retention, UTI, GERD. Bladder scan with 150cc of urine. Good urine output per nursing without need for Foley.  - Patient given GI cocktail. - On reassessment a few hours later, the  patient reports that his abdominal pain has resolved. Plan - UA  Dysphagia and fasciculations -Evaluated by SLP on 12/20 who felt that the patient had difficulty with swallowing thin liquids.Patient expresses that he understands the risk of aspiration and he is persistent about wanting to consume a thin liquid diet. Plan -Continue dysphagia 2 and nectar thick liquids -If patient insists he can have water sitting upright, without straw, using chin tuck  CAD s/p CABG -Continue atorvastatin 80 mg daily -Continue Imdur 60 mg daily -Continue metoprolol 25 mg twice daily  Diabetes Mellitus Type 2 The patient's blood glucose levels have been well-controlled over the last 24 hours. Plan -Continue glargine 10 units -Continue SSI  Dispo: Anticipated discharge in approximately today to SNF.  Jordan Gardner, Jordan Cortieborah N, MD 08/09/2018, 6:46 AM Pager: [phone]

## 2018-08-09 NOTE — Progress Notes (Signed)
  Speech Language Pathology Treatment: Dysphagia  Patient Details Name: Jordan Gardner MRN: [id number] DOB: 05/28/1951 Today's Date: 08/09/2018 Time:  -     Assessment / Plan / Recommendation Clinical Impression  Patient laying flat in bed when entering. He had not eaten his breakfast at this time. Patient repositioned in bed to 90 degrees. Tray set up for patient. Dr. Criselda Gardner spoke with patient about risk of thin liquids and he agreed to assume the risk. Observed patient with breakfast and water (thin). Educated and cued patient to take small sips with a chin tuck and no straw to reduce aspiration risk. Continue staff assistance with meals to provide cues for compensatory strategies. Speech therapy to follow for compensatory training and diet tolerance.    HPI HPI: [name] is a 67 year old M with PMH of type 2 diabetes, HLD, HTN, CVA w/ residual left-sided weakness, PVD, CAD status post CABG (2012), and NSTEMI (04/2017) presenting with respiratory distress, AMS, lethargy and fever. Chest x-ray shows mild bibasilar subsegmental atelectasis or infiltrates.       SLP Plan  Continue with current plan of care       Recommendations  Diet recommendations: Dysphagia 2 (fine chop);Nectar-thick liquid Liquids provided via: Cup;No straw Medication Administration: Crushed with puree Supervision: Staff to assist with self feeding;Full supervision/cueing for compensatory strategies Compensations: Slow rate;Small sips/bites;Follow solids with liquid Postural Changes and/or Swallow Maneuvers: Seated upright 90 degrees                Plan: Continue with current plan of care       GO                Jordan HoseSarah J. Hagar Sadiq, MA, CCC-SLP 08/09/2018 11:10 AM

## 2018-08-09 NOTE — Progress Notes (Addendum)
Patient will DC to: [facility] Anticipated DC date: 08/09/18 Family notified: Sister Transport by: Sharin MonsPTAR  Please send signed scripts with patient.   Per MD patient ready for DC to [facility]. RN, patient, patient's family, and facility notified of DC. Discharge Summary and FL2 sent to facility. RN to call report prior to discharge [phone] Room 312). DC packet on chart. Ambulance transport requested for patient.   CSW will sign off for now as social work intervention is no longer needed. Please consult us again if new needs arise.  Cristobal GoldmannNadia Emmogene Simson, LCSW Clinical Social Worker [phone]

## 2018-08-09 NOTE — [place] FL2 (Addendum)
[place] MEDICAID FL2 LEVEL OF CARE SCREENING TOOL     IDENTIFICATION  Patient Name: Jordan Gardner Birthdate: 06/22/1951 Sex: male Admission Date (Current Location): 08/01/2018  [facility] [place]   Facility and Address:  [facility], [address], [place], [place] [address]      Provider Number: [id number]  Attending Physician Name and Address:  Anne Shutteraines, Alexander N, MD  Relative Name and Phone Number:  Georgana CurioKatina Gardner; sister; [phone]    Current Level of Care: Hospital Recommended Level of Care: Skilled Nursing Facility Prior Approval Number:    Date Approved/Denied:   PASRR Number: [id number]  Discharge Plan: SNF    Current Diagnoses: Patient Active Problem List   Diagnosis Date Noted  . Pneumonia of both lower lobes due to infectious organism (HCC)   . Sepsis with acute respiratory failure without septic shock (HCC) 08/01/2018  . Above knee amputation of right lower extremity (HCC) 02/03/2018  . Above knee amputation of left lower extremity (HCC) 01/13/2018  . Atherosclerosis of native arteries of extremities with gangrene, left leg (HCC)   . Atherosclerosis of native arteries of extremities with gangrene, right leg (HCC)   . Leukocytosis 12/26/2017  . Essential hypertension 12/26/2017  . Acute hypoxemic respiratory failure (HCC) 07/28/2017  . 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 Critical lower limb ischemia   . 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 AKI (acute kidney injury) (HCC) 04/28/2017  . Malnutrition of moderate degree 04/28/2017  . Pressure injury of skin 04/27/2017  . Severe protein-calorie malnutrition (HCC) 04/27/2017  . Physical deconditioning 04/27/2017  . Claudication (HCC) 04/27/2017  . Sacral decubitus ulcer 04/27/2017  .  Accelerated hypertension   . Hyperlipidemia   . Tobacco abuse disorder   . Altered mental status   . PAD (peripheral artery disease) (HCC)   . NSTEMI (non-ST elevated myocardial infarction) (HCC) 04/25/2017  . NECK PAIN 10/16/2008  . BACK PAIN 10/16/2008    Orientation RESPIRATION BLADDER Height & Weight     Self, Place  O2(nasal canula) Incontinent, External catheter Weight: 65.2 kg Height:  6' 0.99" (185.4 cm)  BEHAVIORAL SYMPTOMS/MOOD NEUROLOGICAL BOWEL NUTRITION STATUS      Continent Diet(see discharge summary)  AMBULATORY STATUS COMMUNICATION OF NEEDS Skin   Total Care Verbally Surgical wounds, PU Stage and Appropriate Care(pressure injury on buttocks; Stage 3 on coccyx; hx of bilateral bkas)                       Personal Care Assistance Level of Assistance  Bathing, Feeding, Dressing Bathing Assistance: Maximum assistance Feeding assistance: Independent Dressing Assistance: Maximum assistance     Functional Limitations Info  Sight, Hearing, Speech Sight Info: Adequate Hearing Info: Adequate Speech Info: Adequate    SPECIAL CARE FACTORS FREQUENCY  PT (By licensed PT)  Restorative nursing             Contractures Contractures Info: Not present    Additional Factors Info  Code Status, Allergies, Isolation Precautions, Psychotropic Code Status Info: DNR Allergies Info: No Known Allergies  Psychotropic Info: sertraline (ZOLOFT) tablet 200 mg daily PO; traZODone (DESYREL) tablet 50 mg daily at bedtime PO   Isolation Precautions Info: contact precautions     Current Medications (08/09/2018):  This is the current hospital active medication list Current Facility-Administered Medications  Medication  Dose Route Frequency Provider Last Rate Last Dose  . acetaminophen (TYLENOL) tablet 650 mg  650 mg Oral Q6H PRN Chundi, Vahini, MD   650 mg at 08/09/18 1141   Or  . acetaminophen (TYLENOL) suppository 650 mg  650 mg Rectal Q6H PRN Chundi, Vahini, MD      .  alum & mag hydroxide-simeth (MAALOX/MYLANTA) 200-200-20 MG/5ML suspension 30 mL  30 mL Oral Once Dorrell, Cathleen Cortieborah N, MD       And  . lidocaine (XYLOCAINE) 2 % viscous mouth solution 15 mL  15 mL Oral Once Dorrell, Cathleen Cortieborah N, MD      . atorvastatin (LIPITOR) tablet 80 mg  80 mg Oral q1800 Chundi, Vahini, MD   80 mg at 08/08/18 1807  . cefTRIAXone (ROCEPHIN) 1 g in sodium chloride 0.9 % 100 mL IVPB  1 g Intravenous Q24H Chundi, Vahini, MD 200 mL/hr at 08/08/18 1537 1 g at 08/08/18 1537  . feeding supplement (GLUCERNA SHAKE) (GLUCERNA SHAKE) liquid 237 mL  237 mL Oral TID BM Chundi, Vahini, MD   237 mL at 08/09/18 0913  . gabapentin (NEURONTIN) capsule 300 mg  300 mg Oral TID Chundi, Vahini, MD   300 mg at 08/09/18 0904  . heparin injection 5,000 Units  5,000 Units Subcutaneous Q8H Lorenso Courierhundi, Vahini, MD   5,000 Units at 08/09/18 0553  . insulin aspart (novoLOG) injection 0-15 Units  0-15 Units Subcutaneous TID WC Lorenso Courierhundi, Vahini, MD   2 Units at 08/09/18 0901  . insulin glargine (LANTUS) injection 10 Units  10 Units Subcutaneous QHS Theotis BarrioLee, Joshua K, MD   10 Units at 08/08/18 2157  . ipratropium-albuterol (DUONEB) 0.5-2.5 (3) MG/3ML nebulizer solution 3 mL  3 mL Nebulization Q4H PRN Debe CoderMullen, Emily B, MD      . isosorbide mononitrate (IMDUR) 24 hr tablet 60 mg  60 mg Oral Daily Theotis BarrioLee, Joshua K, MD   60 mg at 08/09/18 0903  . lidocaine (LIDODERM) 5 % 1 patch  1 patch Transdermal Q24H Theotis BarrioLee, Joshua K, MD   1 patch at 08/08/18 1101  . metoprolol tartrate (LOPRESSOR) tablet 25 mg  25 mg Oral BID Lorenso Courierhundi, Vahini, MD   25 mg at 08/09/18 0903  . multivitamin with minerals tablet 1 tablet  1 tablet Oral Daily Lorenso Courierhundi, Vahini, MD   1 tablet at 08/09/18 0903  . nicotine (NICODERM CQ - dosed in mg/24 hours) patch 14 mg  14 mg Transdermal Daily Chundi, Vahini, MD   14 mg at 08/09/18 0912  . promethazine (PHENERGAN) tablet 12.5 mg  12.5 mg Oral Q6H PRN Chundi, Vahini, MD   12.5 mg at 08/06/18 2304  . RESOURCE THICKENUP CLEAR    Oral PRN [name], Emily B, MD      . senna-docusate (Senokot-S) tablet 1 tablet  1 tablet Oral QHS PRN Chundi, Vahini, MD      . sertraline (ZOLOFT) tablet 200 mg  200 mg Oral Daily Chundi, Vahini, MD   200 mg at 08/09/18 0904  . thiamine (VITAMIN B-1) tablet 100 mg  100 mg Oral Daily [name], Emily B, MD   100 mg at 08/09/18 [id number]     Discharge Medications: Please see discharge summary for a list of discharge medications.  Relevant Imaging Results:  Relevant Lab Results:   Additional Information ss#[id number]  Mearl LatinNadia S Izabella Marcantel, LCSW

## 2018-09-21 ENCOUNTER — Encounter (HOSPITAL_COMMUNITY): Payer: Self-pay | Admitting: Emergency Medicine

## 2018-09-21 ENCOUNTER — Inpatient Hospital Stay (HOSPITAL_COMMUNITY): Payer: Medicare HMO

## 2018-09-21 ENCOUNTER — Emergency Department (HOSPITAL_COMMUNITY): Payer: Medicare HMO

## 2018-09-21 ENCOUNTER — Inpatient Hospital Stay (HOSPITAL_COMMUNITY)
Admission: EM | Admit: 2018-09-21 | Discharge: 2018-10-17 | DRG: 871 | Disposition: E | Payer: Medicare HMO | Source: Skilled Nursing Facility | Attending: Family Medicine | Admitting: Family Medicine

## 2018-09-21 DIAGNOSIS — A419 Sepsis, unspecified organism: Secondary | ICD-10-CM | POA: Diagnosis not present

## 2018-09-21 DIAGNOSIS — G709 Myoneural disorder, unspecified: Secondary | ICD-10-CM | POA: Diagnosis present

## 2018-09-21 DIAGNOSIS — E1151 Type 2 diabetes mellitus with diabetic peripheral angiopathy without gangrene: Secondary | ICD-10-CM | POA: Diagnosis present

## 2018-09-21 DIAGNOSIS — F329 Major depressive disorder, single episode, unspecified: Secondary | ICD-10-CM | POA: Diagnosis present

## 2018-09-21 DIAGNOSIS — Z66 Do not resuscitate: Secondary | ICD-10-CM | POA: Diagnosis present

## 2018-09-21 DIAGNOSIS — S78111A Complete traumatic amputation at level between right hip and knee, initial encounter: Secondary | ICD-10-CM | POA: Diagnosis not present

## 2018-09-21 DIAGNOSIS — Z794 Long term (current) use of insulin: Secondary | ICD-10-CM

## 2018-09-21 DIAGNOSIS — J45909 Unspecified asthma, uncomplicated: Secondary | ICD-10-CM | POA: Diagnosis present

## 2018-09-21 DIAGNOSIS — I110 Hypertensive heart disease with heart failure: Secondary | ICD-10-CM | POA: Diagnosis present

## 2018-09-21 DIAGNOSIS — J9601 Acute respiratory failure with hypoxia: Secondary | ICD-10-CM | POA: Diagnosis not present

## 2018-09-21 DIAGNOSIS — J189 Pneumonia, unspecified organism: Secondary | ICD-10-CM | POA: Diagnosis not present

## 2018-09-21 DIAGNOSIS — K559 Vascular disorder of intestine, unspecified: Secondary | ICD-10-CM | POA: Diagnosis present

## 2018-09-21 DIAGNOSIS — J9600 Acute respiratory failure, unspecified whether with hypoxia or hypercapnia: Secondary | ICD-10-CM

## 2018-09-21 DIAGNOSIS — I252 Old myocardial infarction: Secondary | ICD-10-CM

## 2018-09-21 DIAGNOSIS — J690 Pneumonitis due to inhalation of food and vomit: Secondary | ICD-10-CM | POA: Diagnosis present

## 2018-09-21 DIAGNOSIS — I10 Essential (primary) hypertension: Secondary | ICD-10-CM | POA: Diagnosis present

## 2018-09-21 DIAGNOSIS — N179 Acute kidney failure, unspecified: Secondary | ICD-10-CM | POA: Diagnosis not present

## 2018-09-21 DIAGNOSIS — G9341 Metabolic encephalopathy: Secondary | ICD-10-CM | POA: Diagnosis present

## 2018-09-21 DIAGNOSIS — Z89611 Acquired absence of right leg above knee: Secondary | ICD-10-CM

## 2018-09-21 DIAGNOSIS — E872 Acidosis, unspecified: Secondary | ICD-10-CM

## 2018-09-21 DIAGNOSIS — L89153 Pressure ulcer of sacral region, stage 3: Secondary | ICD-10-CM | POA: Diagnosis present

## 2018-09-21 DIAGNOSIS — K625 Hemorrhage of anus and rectum: Secondary | ICD-10-CM | POA: Diagnosis not present

## 2018-09-21 DIAGNOSIS — R6520 Severe sepsis without septic shock: Secondary | ICD-10-CM | POA: Diagnosis not present

## 2018-09-21 DIAGNOSIS — F419 Anxiety disorder, unspecified: Secondary | ICD-10-CM | POA: Diagnosis present

## 2018-09-21 DIAGNOSIS — I5022 Chronic systolic (congestive) heart failure: Secondary | ICD-10-CM | POA: Diagnosis present

## 2018-09-21 DIAGNOSIS — S78112A Complete traumatic amputation at level between left hip and knee, initial encounter: Secondary | ICD-10-CM

## 2018-09-21 DIAGNOSIS — M1990 Unspecified osteoarthritis, unspecified site: Secondary | ICD-10-CM | POA: Diagnosis present

## 2018-09-21 DIAGNOSIS — I69354 Hemiplegia and hemiparesis following cerebral infarction affecting left non-dominant side: Secondary | ICD-10-CM

## 2018-09-21 DIAGNOSIS — L89159 Pressure ulcer of sacral region, unspecified stage: Secondary | ICD-10-CM | POA: Diagnosis present

## 2018-09-21 DIAGNOSIS — I2510 Atherosclerotic heart disease of native coronary artery without angina pectoris: Secondary | ICD-10-CM | POA: Diagnosis present

## 2018-09-21 DIAGNOSIS — Z8249 Family history of ischemic heart disease and other diseases of the circulatory system: Secondary | ICD-10-CM

## 2018-09-21 DIAGNOSIS — Z89612 Acquired absence of left leg above knee: Secondary | ICD-10-CM

## 2018-09-21 DIAGNOSIS — Z515 Encounter for palliative care: Secondary | ICD-10-CM

## 2018-09-21 DIAGNOSIS — E785 Hyperlipidemia, unspecified: Secondary | ICD-10-CM | POA: Diagnosis present

## 2018-09-21 DIAGNOSIS — E875 Hyperkalemia: Secondary | ICD-10-CM | POA: Diagnosis not present

## 2018-09-21 LAB — INFLUENZA PANEL BY PCR (TYPE A & B)
INFLAPCR: NEGATIVE
Influenza B By PCR: NEGATIVE

## 2018-09-21 LAB — CBC WITH DIFFERENTIAL/PLATELET
Abs Immature Granulocytes: 0.08 10*3/uL — ABNORMAL HIGH (ref 0.00–0.07)
Basophils Absolute: 0.1 10*3/uL (ref 0.0–0.1)
Basophils Relative: 0 %
Eosinophils Absolute: 0.4 10*3/uL (ref 0.0–0.5)
Eosinophils Relative: 2 %
HCT: 43.4 % (ref 39.0–52.0)
HEMOGLOBIN: 12.4 g/dL — AB (ref 13.0–17.0)
Immature Granulocytes: 0 %
Lymphocytes Relative: 22 %
Lymphs Abs: 5.3 10*3/uL — ABNORMAL HIGH (ref 0.7–4.0)
MCH: 26.6 pg (ref 26.0–34.0)
MCHC: 28.6 g/dL — ABNORMAL LOW (ref 30.0–36.0)
MCV: 92.9 fL (ref 80.0–100.0)
Monocytes Absolute: 1.1 10*3/uL — ABNORMAL HIGH (ref 0.1–1.0)
Monocytes Relative: 5 %
Neutro Abs: 17.1 10*3/uL — ABNORMAL HIGH (ref 1.7–7.7)
Neutrophils Relative %: 71 %
Platelets: 214 10*3/uL (ref 150–400)
RBC: 4.67 MIL/uL (ref 4.22–5.81)
RDW: 14.8 % (ref 11.5–15.5)
WBC: 24 10*3/uL — ABNORMAL HIGH (ref 4.0–10.5)
nRBC: 0 % (ref 0.0–0.2)

## 2018-09-21 LAB — HEPATIC FUNCTION PANEL
ALT: 29 U/L (ref 0–44)
AST: 39 U/L (ref 15–41)
Albumin: 2.8 g/dL — ABNORMAL LOW (ref 3.5–5.0)
Alkaline Phosphatase: 80 U/L (ref 38–126)
BILIRUBIN TOTAL: 0.8 mg/dL (ref 0.3–1.2)
Bilirubin, Direct: <0.1 mg/dL (ref 0.0–0.2)
Total Protein: 5.6 g/dL — ABNORMAL LOW (ref 6.5–8.1)

## 2018-09-21 LAB — LACTIC ACID, PLASMA
Lactic Acid, Venous: 11.4 mmol/L (ref 0.5–1.9)
Lactic Acid, Venous: 13.5 mmol/L (ref 0.5–1.9)

## 2018-09-21 LAB — URINALYSIS, ROUTINE W REFLEX MICROSCOPIC
Bilirubin Urine: NEGATIVE
Glucose, UA: NEGATIVE mg/dL
Hgb urine dipstick: NEGATIVE
Ketones, ur: NEGATIVE mg/dL
Leukocytes, UA: NEGATIVE
NITRITE: NEGATIVE
PROTEIN: NEGATIVE mg/dL
Specific Gravity, Urine: 1.018 (ref 1.005–1.030)
pH: 5 (ref 5.0–8.0)

## 2018-09-21 LAB — NA AND K (SODIUM & POTASSIUM), RAND UR
Potassium Urine: 28 mmol/L
Sodium, Ur: 23 mmol/L

## 2018-09-21 LAB — BASIC METABOLIC PANEL
ANION GAP: 21 — AB (ref 5–15)
BUN: 112 mg/dL — ABNORMAL HIGH (ref 8–23)
CO2: 10 mmol/L — ABNORMAL LOW (ref 22–32)
Calcium: 10.4 mg/dL — ABNORMAL HIGH (ref 8.9–10.3)
Chloride: 108 mmol/L (ref 98–111)
Creatinine, Ser: 6.65 mg/dL — ABNORMAL HIGH (ref 0.61–1.24)
GFR calc Af Amer: 9 mL/min — ABNORMAL LOW (ref >60)
GFR calc non Af Amer: 8 mL/min — ABNORMAL LOW (ref >60)
GLUCOSE: 102 mg/dL — AB (ref 70–99)
Potassium: 6.1 mmol/L — ABNORMAL HIGH (ref 3.5–5.1)
Sodium: 139 mmol/L (ref 135–145)

## 2018-09-21 LAB — CBG MONITORING, ED
Glucose-Capillary: 91 mg/dL (ref 70–99)
Glucose-Capillary: 83 mg/dL (ref 70–99)

## 2018-09-21 LAB — C DIFFICILE QUICK SCREEN W PCR REFLEX
C Diff antigen: NEGATIVE
C Diff interpretation: NOT DETECTED
C Diff toxin: NEGATIVE

## 2018-09-21 LAB — HIV ANTIBODY (ROUTINE TESTING W REFLEX): HIV Screen 4th Generation wRfx: NONREACTIVE

## 2018-09-21 LAB — GLUCOSE, CAPILLARY: Glucose-Capillary: 52 mg/dL — ABNORMAL LOW (ref 70–99)

## 2018-09-21 LAB — POTASSIUM
Potassium: 6.4 mmol/L (ref 3.5–5.1)
Potassium: 6.4 mmol/L (ref 3.5–5.1)

## 2018-09-21 LAB — I-STAT TROPONIN, ED: Troponin i, poc: 0.08 ng/mL (ref 0.00–0.08)

## 2018-09-21 LAB — MRSA PCR SCREENING: MRSA by PCR: NEGATIVE

## 2018-09-21 LAB — CREATININE, URINE, RANDOM: Creatinine, Urine: 179.12 mg/dL

## 2018-09-21 LAB — BRAIN NATRIURETIC PEPTIDE: B Natriuretic Peptide: 1377.8 pg/mL — ABNORMAL HIGH (ref 0.0–100.0)

## 2018-09-21 LAB — STREP PNEUMONIAE URINARY ANTIGEN: Strep Pneumo Urinary Antigen: NEGATIVE

## 2018-09-21 MED ORDER — ASPIRIN EC 81 MG PO TBEC: 81.0000 mg | DELAYED_RELEASE_TABLET | Freq: Every day | ORAL | Status: DC

## 2018-09-21 MED ORDER — SODIUM CHLORIDE 0.9 % IV SOLN
1.0000 g | INTRAVENOUS | Status: DC
  Administered 2018-09-21: 1 g via INTRAVENOUS
  Filled 2018-09-21: qty 1

## 2018-09-21 MED ORDER — MEGESTROL ACETATE 20 MG PO TABS
20.0000 mg | ORAL_TABLET | Freq: Every day | ORAL | Status: DC
  Filled 2018-09-21: qty 1

## 2018-09-21 MED ORDER — LORAZEPAM 2 MG/ML IJ SOLN
1.0000 mg | Freq: Once | INTRAMUSCULAR | Status: AC
  Administered 2018-09-21: 1 mg via INTRAVENOUS
  Filled 2018-09-21: qty 1

## 2018-09-21 MED ORDER — BIOTENE DRY MOUTH MT LIQD: 15.0000 mL | OROMUCOSAL | Status: DC | PRN

## 2018-09-21 MED ORDER — SODIUM BICARBONATE 8.4 % IV SOLN
50.0000 meq | Freq: Once | INTRAVENOUS | Status: AC
  Administered 2018-09-21: 50 meq via INTRAVENOUS
  Filled 2018-09-21: qty 50

## 2018-09-21 MED ORDER — ACETAMINOPHEN 650 MG RE SUPP: 650.0000 mg | Freq: Four times a day (QID) | RECTAL | Status: DC | PRN

## 2018-09-21 MED ORDER — GABAPENTIN 300 MG PO CAPS: 300.0000 mg | ORAL_CAPSULE | Freq: Three times a day (TID) | ORAL | Status: DC

## 2018-09-21 MED ORDER — SODIUM CHLORIDE 0.9 % IV SOLN
INTRAVENOUS | Status: DC
  Administered 2018-09-21: 07:00:00 via INTRAVENOUS

## 2018-09-21 MED ORDER — ALBUTEROL SULFATE (2.5 MG/3ML) 0.083% IN NEBU
5.0000 mg | INHALATION_SOLUTION | Freq: Once | RESPIRATORY_TRACT | Status: AC
  Administered 2018-09-21: 5 mg via RESPIRATORY_TRACT
  Filled 2018-09-21: qty 6

## 2018-09-21 MED ORDER — SODIUM ZIRCONIUM CYCLOSILICATE 10 G PO PACK
10.0000 g | PACK | Freq: Once | ORAL | Status: DC
  Filled 2018-09-21: qty 1

## 2018-09-21 MED ORDER — SODIUM CHLORIDE 0.9 % IV BOLUS (SEPSIS)
1000.0000 mL | Freq: Once | INTRAVENOUS | Status: AC
  Administered 2018-09-21: 1000 mL via INTRAVENOUS

## 2018-09-21 MED ORDER — OXYCODONE-ACETAMINOPHEN 5-325 MG PO TABS: 1.0000 | ORAL_TABLET | ORAL | Status: DC | PRN

## 2018-09-21 MED ORDER — SODIUM CHLORIDE 0.9 % IV BOLUS
1000.0000 mL | Freq: Once | INTRAVENOUS | Status: AC
  Administered 2018-09-21: 1000 mL via INTRAVENOUS

## 2018-09-21 MED ORDER — DEXTROSE 50 % IV SOLN
INTRAVENOUS | Status: AC
  Administered 2018-09-21: 50 mL
  Filled 2018-09-21: qty 50

## 2018-09-21 MED ORDER — DEXTROSE 50 % IV SOLN
1.0000 | Freq: Once | INTRAVENOUS | Status: AC
  Administered 2018-09-21: 50 mL via INTRAVENOUS
  Filled 2018-09-21: qty 50

## 2018-09-21 MED ORDER — STERILE WATER FOR INJECTION IV SOLN
INTRAVENOUS | Status: DC
  Administered 2018-09-21: 04:00:00 via INTRAVENOUS
  Filled 2018-09-21 (×4): qty 850

## 2018-09-21 MED ORDER — ATORVASTATIN CALCIUM 80 MG PO TABS: 80.0000 mg | ORAL_TABLET | Freq: Every day | ORAL | Status: DC

## 2018-09-21 MED ORDER — VANCOMYCIN 50 MG/ML ORAL SOLUTION
125.0000 mg | Freq: Three times a day (TID) | ORAL | Status: DC
  Administered 2018-09-21: 125 mg via ORAL
  Filled 2018-09-21 (×2): qty 2.5

## 2018-09-21 MED ORDER — ALBUTEROL SULFATE (2.5 MG/3ML) 0.083% IN NEBU: 2.5000 mg | INHALATION_SOLUTION | RESPIRATORY_TRACT | Status: DC | PRN

## 2018-09-21 MED ORDER — GLYCOPYRROLATE 0.2 MG/ML IJ SOLN: 0.2000 mg | INTRAMUSCULAR | Status: DC | PRN

## 2018-09-21 MED ORDER — ONDANSETRON HCL 4 MG/2ML IJ SOLN: 4.0000 mg | Freq: Four times a day (QID) | INTRAMUSCULAR | Status: DC | PRN

## 2018-09-21 MED ORDER — DOCUSATE SODIUM 100 MG PO CAPS: 100.0000 mg | ORAL_CAPSULE | Freq: Every day | ORAL | Status: DC | PRN

## 2018-09-21 MED ORDER — INSULIN ASPART 100 UNIT/ML ~~LOC~~ SOLN: 0.0000 [IU] | SUBCUTANEOUS | Status: DC

## 2018-09-21 MED ORDER — SERTRALINE HCL 100 MG PO TABS
200.0000 mg | ORAL_TABLET | Freq: Every day | ORAL | Status: DC
  Filled 2018-09-21: qty 2

## 2018-09-21 MED ORDER — GLYCOPYRROLATE 1 MG PO TABS: 1.0000 mg | ORAL_TABLET | ORAL | Status: DC | PRN

## 2018-09-21 MED ORDER — LEVOFLOXACIN IN D5W 750 MG/150ML IV SOLN
750.0000 mg | Freq: Once | INTRAVENOUS | Status: AC
  Administered 2018-09-21: 750 mg via INTRAVENOUS
  Filled 2018-09-21: qty 150

## 2018-09-21 MED ORDER — INSULIN ASPART 100 UNIT/ML ~~LOC~~ SOLN
10.0000 [IU] | Freq: Once | SUBCUTANEOUS | Status: AC
  Administered 2018-09-21: 10 [IU] via INTRAVENOUS

## 2018-09-21 MED ORDER — ALPRAZOLAM 0.25 MG PO TABS: 0.2500 mg | ORAL_TABLET | Freq: Every day | ORAL | Status: DC

## 2018-09-21 MED ORDER — HEPARIN SODIUM (PORCINE) 5000 UNIT/ML IJ SOLN: 5000.0000 [IU] | Freq: Three times a day (TID) | INTRAMUSCULAR | Status: DC

## 2018-09-21 MED ORDER — ONDANSETRON 4 MG PO TBDP: 4.0000 mg | ORAL_TABLET | Freq: Four times a day (QID) | ORAL | Status: DC | PRN

## 2018-09-21 MED ORDER — SODIUM CHLORIDE 0.9 % IV BOLUS
500.0000 mL | Freq: Once | INTRAVENOUS | Status: DC
  Administered 2018-09-21: 500 mL via INTRAVENOUS

## 2018-09-21 MED ORDER — ACETAMINOPHEN 325 MG PO TABS: 650.0000 mg | ORAL_TABLET | Freq: Four times a day (QID) | ORAL | Status: DC | PRN

## 2018-09-21 MED ORDER — HYDROMORPHONE HCL 1 MG/ML IJ SOLN
0.5000 mg | INTRAMUSCULAR | Status: DC | PRN
  Administered 2018-09-21 (×2): 0.5 mg via INTRAVENOUS
  Filled 2018-09-21 (×3): qty 1

## 2018-09-21 MED ORDER — LORAZEPAM 2 MG/ML IJ SOLN: 0.5000 mg | INTRAMUSCULAR | Status: DC | PRN

## 2018-09-21 MED ORDER — LOPERAMIDE HCL 2 MG PO CAPS: 2.0000 mg | ORAL_CAPSULE | ORAL | Status: DC | PRN

## 2018-09-21 MED ORDER — VANCOMYCIN HCL IN DEXTROSE 1-5 GM/200ML-% IV SOLN
1000.0000 mg | Freq: Once | INTRAVENOUS | Status: AC
  Administered 2018-09-21: 1000 mg via INTRAVENOUS
  Filled 2018-09-21: qty 200

## 2018-09-21 MED ORDER — HYDROMORPHONE HCL 1 MG/ML IJ SOLN: 1.0000 mg | INTRAMUSCULAR | Status: DC | PRN

## 2018-09-21 MED ORDER — MIRTAZAPINE 15 MG PO TABS: 15.0000 mg | ORAL_TABLET | Freq: Every day | ORAL | Status: DC

## 2018-09-26 LAB — CULTURE, BLOOD (ROUTINE X 2)
Culture: NO GROWTH
Culture: NO GROWTH
Special Requests: ADEQUATE
Special Requests: ADEQUATE

## 2018-10-17 NOTE — Consult Note (Signed)
WOC Nurse wound consult note Reason for Consult: Patient with multiple comorbid conditions, now with multiple bowel movements and suspected C. Diff. See Dr. Sheryn Bison note for additional suspicion of ischemic bowel. Stage 3 Pressure injury, chronic, nonhealing.  Bilateral AKA.  Wound type:Pressure Pressure Injury POA: Yes/No/NA Measurement: 3cm x 1.8cm x 0.4cm Wound bed:red, dry Drainage (amount, consistency, odor) scant serous Periwound: macerated, rolled/closed edges (epibole) Dressing procedure/placement/frequency:I will implement a twice daily (and PRN soiling) saline moistened gauze to the area topped with the house silicone foam dressing in conjunction with turning and repositioning off of the affected area (supine position to be avoided/minimized).  WOC nursing team will not follow, but will remain available to this patient, the nursing and medical teams.  Please re-consult if needed. Thanks, Ladona Mow, MSN, RN, GNP, [place]  Pager# [phone]

## 2018-10-17 NOTE — Progress Notes (Signed)
The patient presents critically ill with multiple organ failure, including renal failure, hypoxic respiratory failure.  He also has new abdominal pain, rising lactic acid despite fluids and diarrhea, suggesting ischemic bowel despite normal CT imaging.  I personally discussed the case with POA by phone, who reiterated to me that dialysis and extensive exploratory surgery were not within Jordan Gardner's goals of care. Under the circumstances, we will broaden to cover Cdiff, we will continue fluid resuscitation aggressively, but he has an extremely high mortality and if ischemic bowel he has certain mortality in the next 24 hours.

## 2018-10-17 NOTE — Progress Notes (Signed)
PROGRESS NOTE    Jordan Gardner  HWY:[id number] DOB: 1950/10/17 DOA: 09/20/2018 PCP: [facility]      Brief Narrative:  Jordan Gardner is a 68 y.o. M with bilateral AKA, DM, CAD, PVD s/p bilateral BKA, sCHF EF 40%, hx stroke with left hemiparesis who presented with hypoxia from SNF with SOB.  EMS found with SpO2 70% room air.  In ER, CXR showed aspiration pneumonia, creatinine >6 mg/dL, K > 6 mmol/L, and lactic acid >5.  Started on empiric antibiotics, temporizing measures for K, and IV fluids.  Family discussed goals of care with EDP, including no dialysis.        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 Through the day today, he has had worsening renal function, worsening hyperkalemia despite aggressive fluids, antibiotics, and multiple K temporizing measures.  Further, aggressive fluid resuscitation is clearly worsening his respiratory failure, and he has had abdominal pain, worsening lactic acidosis and rectal bleeding that suggest bowel ischemia.    It is my opinion that his renal failure is too severe to correct without hemodialysis.  Further it is my opinion that he  he would require exploratory for likely ischemic bowel.  POA at bedside reiterated to me directly that this would be against patient's wishes, and he directly to me, in her presence made clear he would wish no surgery under any circumstances.    We will withdraw IV antibiotics Stop fluids Start IV dilaudid 0.5 mg q1hrs, increase overnight as needed for pain or dyspnea Lorazepam IV 1 mg as needed for anxiety  Discontinue blood draws, tele and oximetry      Objective: Vitals:   09/25/2018 1115 09/27/2018 1200  10/13/2018 1315 10/06/2018 1400  BP: (!) 124/50 139/84 130/67 (!) 146/127  Pulse:   (!) 29   Resp: 19  (!) 25 (!) 25  Temp:   98.5 F (36.9 C)   TempSrc:   Oral   SpO2: 96%  95%       Data Reviewed: I have personally reviewed following labs and imaging studies:  CBC: Recent Labs  Lab 09/18/2018 0122  WBC 24.0*  NEUTROABS 17.1*  HGB 12.4*  HCT 43.4  MCV 92.9  PLT 214   Basic Metabolic Panel: Recent Labs  Lab 09/19/2018 0122 10/12/2018 0953 10/07/2018 1303  NA 139  --   --   K 6.1* 6.4* 6.4*  CL 108  --   --   CO2 10*  --   --   GLUCOSE 102*  --   --   BUN 112*  --   --   CREATININE 6.65*  --   --   CALCIUM 10.4*  --   --    GFR: CrCl cannot be calculated (Unknown ideal weight.). Liver Function Tests: Recent Labs  Lab 10/07/2018 0348  AST 39  ALT 29  ALKPHOS 80  BILITOT 0.8  PROT 5.6*  ALBUMIN 2.8*   CBG: Recent Labs  Lab 09/25/2018 0255 10/02/2018 0859 09/26/2018 1312  GLUCAP 91 83 52*    Recent Results (from the past 240 hour(s))  C difficile quick scan w PCR reflex     Status: None   Collection Time: 10/04/2018  8:45 AM  Result Value Ref Range Status   C  Diff antigen NEGATIVE NEGATIVE Final   C Diff toxin NEGATIVE NEGATIVE Final   C Diff interpretation No C. difficile detected.  Final    Comment: Performed at [facility], [address], [place], [place] [address]  MRSA PCR Screening     Status: None   Collection Time: 2018/10/10 12:55 PM  Result Value Ref Range Status   MRSA by PCR NEGATIVE NEGATIVE Final    Comment:        The GeneXpert MRSA Assay (FDA approved for NASAL specimens only), is one component of a comprehensive MRSA colonization surveillance program. It is not intended to diagnose MRSA infection nor to guide or monitor treatment for MRSA infections. Performed at [facility], [address], [place], [place] [address]          Radiology Studies: Ct Abdomen Pelvis Wo Contrast  Result Date: 10-10-2018 CLINICAL DATA:   Abdominal pain and sepsis. History of decubitus ulcers, bilateral above knee amputations. EXAM: CT ABDOMEN AND PELVIS WITHOUT CONTRAST TECHNIQUE: Multidetector CT imaging of the abdomen and pelvis was performed following the standard protocol without IV contrast. COMPARISON:  CT abdomen and pelvis July 28, 2017 FINDINGS: Moderate respiratory motion degraded examination. LOWER CHEST: Bilateral lower lobe consolidation with bronchiectasis. HEPATOBILIARY: Normal. PANCREAS: Atrophic, nonacute. SPLEEN: Normal. ADRENALS/URINARY TRACT: Kidneys are orthotopic, demonstrating normal size and morphology. Punctate bilateral nephrolithiasis. No hydronephrosis; limited assessment for renal masses by nonenhanced CT. The unopacified ureters are normal in course and caliber. Urinary bladder is decompressed by Foley catheter. Normal adrenal glands. STOMACH/BOWEL: Fluid distended stomach. The small and large bowel are normal in course and caliber without inflammatory changes, sensitivity decreased by lack of enteric contrast. Mild stool distended rectum. Mild colonic diverticulosis. The appendix is not discretely identified, however there are no inflammatory changes in the right lower quadrant. VASCULAR/LYMPHATIC: Aortoiliac vessels are normal in course and caliber. Moderate calcific atherosclerosis. No lymphadenopathy by CT size criteria. REPRODUCTIVE: Normal. OTHER: No intraperitoneal free fluid or free air. MUSCULOSKELETAL: Acute versus subacute sacrococcygeal fracture. Sacral skin thickening and skin irregularity without subcutaneous gas or radiopaque foreign bodies. No definite fluid collection the limited assessment without intravenous contrast. Severe L5-S1 degenerative disc resulting in severe neural foraminal narrowing. Similar scattered Schmorl's nodes. IMPRESSION: 1. Respiratory motion degraded examination. 2. Fluid distended stomach, aspiration risk. 3. Punctate nonobstructing nephrolithiasis. 4. Bilateral lower lobe  consolidation concerning for pneumonia, however presence of bronchiectasis suggests chronic component. 5. Acute versus subacute nondisplaced sacral fracture. Aortic Atherosclerosis (ICD10-I70.0). Electronically Signed   By: Awilda Metro M.D.   On: 10-10-18 05:43   Dg Chest 2 View  Result Date: 10-10-18 CLINICAL DATA:  Cough, congestion, shortness of breath EXAM: CHEST - 2 VIEW COMPARISON:  08/02/2018 FINDINGS: Right lower lobe airspace disease. No pleural effusion or pneumothorax. Stable cardiomediastinal silhouette. Prior CABG. No acute osseous abnormality. IMPRESSION: Right lower lobe pneumonia. Electronically Signed   By: Elige Ko   On: 10-Oct-2018 01:22           Alberteen Sam, MD [facility] Oct 10, 2018, 8:43 PM     Please page through AMION:  [email or web address] Password [email or web address] If 7PM-7AM, please contact night-coverage

## 2018-10-17 NOTE — ED Notes (Signed)
Rectal foley placed 

## 2018-10-17 NOTE — Progress Notes (Signed)
Hypoglycemic Event  CBG: 52  Treatment: amp D50  Symptoms: none  Follow-up CBG: no follow up- transitioned to comfort care  Possible Reasons for Event: sepsis  Comments/MD notified: danford    Jordan Gardner

## 2018-10-17 NOTE — Significant Event (Signed)
Pt expired at 2155 hours. Pronounced by 2 RNs. He was a DNR and made complete comfort care on admission. In hospital death was expected. Death certificate completed and given to Marylene LandAngela, Licensed conveyancerunit secretary.  [email or web address], NP [facility]

## 2018-10-17 NOTE — ED Provider Notes (Signed)
TIME SEEN: 12:46 AM  CHIEF COMPLAINT: Shortness of breath, hypoxia  HPI: Patient is a 68 year old male with history of hypertension, diabetes, hyperlipidemia, CAD, stroke with left-sided weakness who presents to the emergency department from [facility] with shortness of breath.  Found to have oxygen saturation of 70% on room air.  Does not wear oxygen chronically.  Does have history of CHF and is on furosemide.  Recently was treated for pneumonia but no longer on antibiotics.  No known fevers.  Patient unable to provide accurate history.  History provided by EMS and patient's niece at bedside.  Patient is a DNR.  Patient denies pain.  ROS: Level 5 caveat for altered mental status  PAST MEDICAL HISTORY/PAST SURGICAL HISTORY:  Past Medical History:  Diagnosis Date  . Anxiety   . Arthritis   . Asthma   . Coronary artery disease   . Depression   . Diabetes mellitus without complication (HCC)    type 2  . Headache   . Hyperlipidemia   . Hypertension   . Myocardial infarction (HCC)   . Neuromuscular disorder (HCC)   . PAD (peripheral artery disease) (HCC)   . Protein calorie malnutrition (HCC) 04/2017  . Psychotic affective disorder (HCC)    Per sister. Unsure of Dx. Pt sleeps late and difficult to wake. odd affect  . Sacral decubitus ulcer 04/2017  . Stroke [facility](HCC)    left side weakness    MEDICATIONS:  Prior to Admission medications   Medication Sig Start Date End Date Taking? Authorizing Provider  ALPRAZolam (XANAX) 0.25 MG tablet Take 0.25 mg by mouth at bedtime.    [provider]  Amino Acids-Protein Hydrolys (FEEDING SUPPLEMENT, PRO-STAT SUGAR FREE 64,) LIQD Take 30 mLs by mouth 2 (two) times daily.    [provider]  aspirin EC 81 MG EC tablet Take 1 tablet (81 mg total) by mouth daily. 04/30/17   Cleora FleetJohnson, Clanford L, MD  aspirin-acetaminophen-caffeine (EXCEDRIN MIGRAINE) [id number] MG tablet Take 2 tablets by mouth every 8 (eight) hours as  needed for headache.    [provider]  atorvastatin (LIPITOR) 80 MG tablet Take 80 mg by mouth daily at 6 PM.     [provider]  benzocaine-menthol (CHLORASEPTIC SORE THROAT) 6-10 MG lozenge Take 1 lozenge by mouth every 2 (two) hours as needed for sore throat.    [provider]  docusate sodium (COLACE) 100 MG capsule Take 100 mg by mouth daily as needed for mild constipation.    [provider]  feeding supplement, GLUCERNA SHAKE, (GLUCERNA SHAKE) LIQD Take 237 mLs by mouth 2 (two) times daily between meals. Patient not taking: Reported on 08/01/2018 04/30/17   Cleora FleetJohnson, Clanford L, MD  furosemide (LASIX) 20 MG tablet Take 20 mg by mouth daily.    [provider]  gabapentin (NEURONTIN) 300 MG capsule Take 1 capsule (300 mg total) by mouth 3 (three) times daily. 02/03/18   Adonis HugueninZamora, Erin R, NP  insulin glargine (LANTUS) 100 UNIT/ML injection Inject 0.05 mLs (5 Units total) into the skin at bedtime. 08/09/18   Dorrell, Cathleen Cortieborah N, MD  isosorbide mononitrate (IMDUR) 60 MG 24 hr tablet Take 60 mg by mouth daily.    [provider]  lisinopril (PRINIVIL,ZESTRIL) 2.5 MG tablet Take 1 tablet (2.5 mg total) daily by mouth. 07/02/17   Arty Baumgartneroberts, Lindsay B, NP  loperamide (IMODIUM A-D) 2 MG tablet Take 2 mg by mouth as needed for diarrhea or loose stools.    [provider]  megestrol (MEGACE) 20 MG tablet Take 20 mg by mouth daily.    [provider]  metFORMIN (GLUCOPHAGE) 1000 MG tablet Take 1,000 mg by mouth 2 (two) times daily with a meal.    [provider]  metoprolol tartrate (LOPRESSOR) 50 MG tablet Take 0.5 tablets (25 mg total) by mouth 2 (two) times daily. 01/01/18   Osvaldo Shipper, MD  Multiple Vitamin (MULTIVITAMIN WITH MINERALS) TABS tablet Take 1 tablet by mouth daily. 04/30/17   Johnson, Clanford L, MD  nitroGLYCERIN (NITROSTAT) 0.4 MG SL tablet Place 1 tablet (0.4 mg total) under the tongue every 5 (five) minutes  x 3 doses as needed for chest pain. 04/29/17   Johnson, Clanford L, MD  ondansetron (ZOFRAN) 4 MG tablet Take 4 mg by mouth every 6 (six) hours as needed for nausea or vomiting.    [provider]  oxyCODONE-acetaminophen (PERCOCET/ROXICET) 5-325 MG tablet Take 1 tablet by mouth every 4 (four) hours as needed for severe pain. Patient taking differently: Take 1-2 tablets by mouth every 4 (four) hours as needed for severe pain.  01/16/18   Nadara Mustard, MD  sertraline (ZOLOFT) 100 MG tablet Take 200 mg by mouth daily. DEPRESSION/ANXIETY    [provider]  Vitamin D, Ergocalciferol, (DRISDOL) 1.25 MG (50000 UT) CAPS capsule Take 50,000 Units by mouth every Friday.    [provider]    ALLERGIES:  No Known Allergies  SOCIAL HISTORY:  Social History   Tobacco Use  . Smoking status: Current Some Day Smoker    Packs/day: 1.00    Types: Cigarettes  . Smokeless tobacco: Never Used  Substance Use Topics  . Alcohol use: No    FAMILY HISTORY: Family History  Problem Relation Age of Onset  . Hypertension Sister     EXAM: BP 138/74 (BP Location: Right Arm)   Pulse 94   Temp 98.7 F (37.1 C) (Oral)   Resp 16   SpO2 94%  CONSTITUTIONAL: Alert and oriented to person but does not answer questions appropriately at all times.  He is chronically ill-appearing, thin. HEAD: Normocephalic EYES: Conjunctivae clear, pupils appear equal, EOMI ENT: normal nose; moist mucous membranes NECK: Supple, no meningismus, no nuchal rigidity, no LAD  CARD: RRR; S1 and S2 appreciated; no murmurs, no clicks, no rubs, no gallops RESP: Normal chest excursion without splinting or tachypnea; breath sounds diminished at bases bilaterally.  Hypoxic on room air.  No significant rhonchi, wheezes or rales noted.  Speaking full sentences.  Not hypoxic on nasal cannula. ABD/GI: Normal bowel sounds; non-distended; soft, non-tender, no rebound, no guarding, no peritoneal signs, no  hepatosplenomegaly BACK:  The back appears normal and is non-tender to palpation, there is no CVA tenderness EXT: Normal ROM in all joints; non-tender to palpation; no edema; normal capillary refill; no cyanosis, no calf tenderness or swelling, status post bilateral AKA    SKIN: Normal color for age and race; warm; no rash NEURO: Moves all extremities equally PSYCH: The patient's mood and manner are appropriate. Grooming and personal hygiene are appropriate.  MEDICAL DECISION MAKING: Patient here with shortness of breath and new oxygen requirement.  Has history of CHF.  Differential includes CHF, pneumonia, PE.  He denies any pain.  Recently treated for pneumonia.  Feels warm to touch here.  Will check rectal temperature.  Will obtain labs, CXR.  EKG shows no new ischemic change or arrhythmia.  ED PROGRESS: Patient has a leukocytosis of 24,000 with left shift.  Rectal temp normal.  Chest x-ray concerning for right lower lobe infiltrate.  Will treat for healthcare associated pneumonia with IV Levaquin.  Will obtain blood cultures.  Labs also show potassium of 6.1 without EKG changes.  He has new renal failure with creatinine of 6.6.  He has a metabolic acidosis secondary to uremia.  Discussed with patient's family regarding these results.  Patient is confused currently which may be from encephalopathy from uremia.  His niece states that her mother previously helped to manage patient's healthcare.  His niece states that unfortunately her mother just passed away.  She states that she wants her uncle to be made comfortable and she does not believe that he would want dialysis.  He is a DNR/DNI.  She agrees to admission to the hospital for medical management.  Will give IV fluids, insulin and dextrose, albuterol for hyperkalemia.   3:16 AM Discussed patient's case with hospitalist, Dr. Julian ReilGardner.  I have recommended admission and patient (and family if present) agree with this plan. Admitting physician will  place admission orders.   I reviewed all nursing notes, vitals, pertinent previous records, EKGs, lab and urine results, imaging (as available).       EKG Interpretation  Date/Time:  Tuesday September 21 2018 00:01:53 EST Ventricular Rate:  93 PR Interval:    QRS Duration: 172 QT Interval:  425 QTC Calculation: 529 R Axis:   160 Text Interpretation:  Sinus rhythm Nonspecific intraventricular conduction delay No significant change since last tracing Confirmed by Annalyssa Thune, Baxter HireKristen [phone]([id number]) on 09/27/2018 12:59:32 AM         CRITICAL CARE Performed by: Baxter HireKristen Jodey Burbano   Total critical care time: 75 minutes  Critical care time was exclusive of separately billable procedures and treating other patients.  Critical care was necessary to treat or prevent imminent or life-threatening deterioration.  Critical care was time spent personally by me on the following activities: development of treatment plan with patient and/or surrogate as well as nursing, discussions with consultants, evaluation of patient's response to treatment, examination of patient, obtaining history from patient or surrogate, ordering and performing treatments and interventions, ordering and review of laboratory studies, ordering and review of radiographic studies, pulse oximetry and re-evaluation of patient's condition.     Undrea Archbold, Layla MawKristen N, DO 11/26/2018 [phone]

## 2018-10-17 NOTE — Progress Notes (Signed)
Potassium rising.  Patient oliguric.  Mentation worsening.  Abdominal pain severe and now rectal hemorrhage noted briefly.  At this time I strongly suspect ischemic bowel is complicated respiratory and renal failure.  I confirmed with niece/POA at bedside, patient would not want dialysis, would not want exploratory laparotomy. She states he would want comfort measures above all. This had previously been confirmed by Drs Elesa Massed and Julian Reil.  We will continue fluids and trial Lokelma.  However, if this is indeed ischemic bowel, these are futile efforts, and we will see worsening K and lactic acid.  If so I would recommend withdrawing antibiotics and pursuing comfort measures only at the direction of Palliative Care.

## 2018-10-17 NOTE — Progress Notes (Addendum)
Lactic acid noted, giving 2nd L bolus.  Will also put him on 50cc/hr NS (in addition to the 100 cc/hr isotonic bicarb).  Will make NPO given fluid distended stomach.  Also want to get SLP eval later this AM anyhow.  Apparently just had large BM per RN.  No vomiting.  Will hold off on NGT for the moment.  Dont THINK its mesenteric ischemia since no obvious findings to suggest such on CT, but regardless, dont think that this DNR/DNI patient whom family isnt even sure they want dialysis on would be a surgical candidate if it were anyhow.  Repeat AM labs and lactate are currently pending at this time.  Patient sleeping comfortably at this time, wakes easily though.

## 2018-10-17 NOTE — ED Notes (Signed)
Dr. Julian Reil ( admitting) updated on elevated Lactic Acid result.

## 2018-10-17 NOTE — Death Summary Note (Signed)
Death Summary Note  Jordan Gardner was a 68 y.o. M with bilateral AKA, DM, CAD, PVD s/p bilateral BKA, sCHF EF 40%, hx stroke with left hemiparesis who presented with hypoxia from SNF with SOB.  EMS found with SpO2 70% room air.  In ER, CXR showed aspiration pneumonia, creatinine >6 mg/dL, K > 6 mmol/L, and lactic acid >5.    He was initially started on empiric antibiotics, temporizing measures for K, and IV fluids.  Family discussed goals of care with EDP, including no dialysis.  The patient was admitted to the floor where his respiratory status deteriorated, he became persistently severely tachypneic, and his mentation worsened.  His potassium and creatinine increased despite maximum medical therapies.  Clinically, there was high suspicion for ischemic bowel.  Dialysis and exploratory laparotomy were both discussed, and patient was able to articulate clearly that he would not want extreme measures, niece/family concurred.  The patient was made comfort care, and passed peacefully.

## 2018-10-17 NOTE — Consult Note (Signed)
Consultation Note Date: September 25, 2018   Patient Name: Jordan Gardner  DOB: 06/10/1951  MRN: [id number]  Age / Sex: 68 y.o., male  PCP: [facility] Referring Physician: Edwin Dada, *  Reason for Consultation: Establishing goals of care and Terminal Care  HPI/Patient Profile: 68 y.o. male  with past medical history of T2DM, HLD, CAD, CHF, CVA w/ residual L side weakness, bilateral AKA, and psychotic affective disorder admitted on 25-Sep-2018 with shortness of breath and abdominal pain. Concern for aspiration pna - CXR revealed RLL pna. Labs revealed renal failure and hyperkalemia. Lactic acid >11. Elevated white count. Concern for ischemic bowel. Renal function and mentation have worsened throughout hospitalizations and now with rectal hemorrhage. PMT consulted for terminal care.  Clinical Assessment and Goals of Care: I have reviewed medical records including EPIC notes, labs and imaging, received report from RN, assessed the patient and then met with patient's niece, Annie Main, and close family friend,  to discuss diagnosis prognosis, GOC, EOL wishes, disposition and options.  I introduced Palliative Medicine as specialized medical care for people living with serious illness. It focuses on providing relief from the symptoms and stress of a serious illness. The goal is to improve quality of life for both the patient and the family.  Dionne shares she has spoken with Dr. Loleta Books about patient's condition - all of her questions were answered by him. She has decided to focus on comfort and avoid aggressive medical interventions. She believes these choices honor wishes the patient has expressed previously.  During my assessment, RN administering medications for comfort - dilaudid and ativan. Patient had good response - relief of agitation and work of breathing during my visit. Patient unresponsive, cool to touch.  Family tearful at bedside  and requesting chaplain visit.   Comfort orders have been placed and are appropriate. Dilaudid drip may be needed if symptoms increase - not needed now.   Anticipate hospital death - hours-days.  Primary Decision Maker NEXT OF KIN - niece Dionne   SUMMARY OF RECOMMENDATIONS   Full comfort care as ordered by Dr. Loleta Books May need dilaudid drip  Anticipate hospital death  Consult chaplain  Code Status/Advance Care Planning:  DNR  Additional Recommendations (Limitations, Scope, Preferences):  Full Comfort Care   Prognosis:   Hours - Days  Discharge Planning: Anticipated Hospital Death      Primary Diagnoses: Present on Admission: . (Resolved) Sepsis with acute respiratory failure without septic shock ([place]) . HCAP (healthcare-associated pneumonia) . Hyperkalemia . Acute kidney failure ([place]) . Accelerated hypertension . Chronic systolic CHF (congestive heart failure) ([place]) . Acute hypoxemic respiratory failure ([place]) . Sacral decubitus ulcer   I have reviewed the medical record, interviewed the patient and family, and examined the patient. The following aspects are pertinent.  Past Medical History:  Diagnosis Date  . Anxiety   . Arthritis   . Asthma   . Coronary artery disease   . Depression   . Diabetes mellitus without complication ([place])    type 2  . Headache   . Hyperlipidemia   . Hypertension   . Myocardial infarction ([place])   . Neuromuscular disorder ([place])   . PAD (peripheral artery disease) ([place])   . Protein calorie malnutrition ([place]) 04/2017  . Psychotic affective disorder ([place])    Per sister. Unsure of Dx. Pt sleeps late and difficult to wake. odd affect  . Sacral decubitus ulcer 04/2017  . Stroke [facility])    left side weakness   Social History  Socioeconomic History  . Marital status: Single    Spouse name: Not on file  . Number of children: Not on file  . Years of education: Not on file  . Highest education level: Not on file  Occupational  History  . Not on file  Social Needs  . Financial resource strain: Not on file  . Food insecurity:    Worry: Not on file    Inability: Not on file  . Transportation needs:    Medical: Not on file    Non-medical: Not on file  Tobacco Use  . Smoking status: Current Some Day Smoker    Packs/day: 1.00    Types: Cigarettes  . Smokeless tobacco: Never Used  Substance and Sexual Activity  . Alcohol use: No  . Drug use: No  . Sexual activity: Not on file  Lifestyle  . Physical activity:    Days per week: Not on file    Minutes per session: Not on file  . Stress: Not on file  Relationships  . Social connections:    Talks on phone: Not on file    Gets together: Not on file    Attends religious service: Not on file    Active member of club or organization: Not on file    Attends meetings of clubs or organizations: Not on file    Relationship status: Not on file  Other Topics Concern  . Not on file  Social History Narrative  . Not on file   Family History  Problem Relation Age of Onset  . Hypertension Sister    Scheduled Meds: . sodium zirconium cyclosilicate  10 g Oral Once   Continuous Infusions: PRN Meds:.acetaminophen **OR** acetaminophen, albuterol, glycopyrrolate **OR** glycopyrrolate **OR** glycopyrrolate, HYDROmorphone (DILAUDID) injection, loperamide, LORazepam, ondansetron **OR** ondansetron (ZOFRAN) IV No Known Allergies Review of Systems  Unable to perform ROS: Mental status change    Physical Exam Constitutional:      Appearance: He is underweight. He is toxic-appearing.     Comments: unresponsive  Cardiovascular:     Rate and Rhythm: Normal rate and regular rhythm.  Pulmonary:     Comments: Tachypnea relieved by dilaudid during assessment Musculoskeletal:     Comments: bilateral AKA  Skin:    General: Skin is cool.  Neurological:     Mental Status: He is unresponsive.     Vital Signs: BP 139/84   Pulse 90   Temp (!) 97.5 F (36.4 C) (Rectal)    Resp 19   SpO2 96%  Pain Scale: 0-10   Pain Score: Asleep   SpO2: SpO2: 96 % O2 Device:SpO2: 96 % O2 Flow Rate: .O2 Flow Rate (L/min): 4 L/min  IO: Intake/output summary:   Intake/Output Summary (Last 24 hours) at 03-Oct-2018 1448 Last data filed at October 03, 2018 1038 Gross per 24 hour  Intake 1181.67 ml  Output -  Net 1181.67 ml    LBM:   Baseline Weight:   Most recent weight:       Palliative Assessment/Data: PPS 10%    Time Total: 30 minutes Greater than 50%  of this time was spent counseling and coordinating care related to the above assessment and plan.  Juel Burrow, DNP, AGNP-C Palliative Medicine Team [phone] Pager: [phone]

## 2018-10-17 NOTE — ED Triage Notes (Signed)
Patient arrived with EMS from [facility] , staff reported SOB with chest congestion this evening and vommitted x1 prior to arrival , CBG= 119 .

## 2018-10-17 NOTE — Progress Notes (Signed)
Pharmacy Antibiotic Note  Jordan Gardner is a 69 y.o. male admitted on 10/10/2018 with pneumonia.  Pharmacy has been consulted for Vancomycin dosing. WBC elevated. Acute renal failure.   Plan: -Vancomycin 1000 mg IV x 1 mg given, re-check vancomycin level in 24-48 hours depending on Scr trend to assess dosing needs -Cefepime per MD -Trend WBC, temp, renal function  -F/U infectious work-up -Drug levels as indicated  Temp (24hrs), Avg:98.1 F (36.7 C), Min:97.5 F (36.4 C), Max:98.7 F (37.1 C)  Recent Labs  Lab 10/07/2018 0122 09/19/2018 0348  WBC 24.0*  --   CREATININE 6.65*  --   LATICACIDVEN  --  11.4*    CrCl cannot be calculated (Unknown ideal weight.).    No Known Allergies   Abran Duke 09/19/2018 6:05 AM

## 2018-10-17 NOTE — ED Notes (Signed)
Patient transported to X-ray 

## 2018-10-17 NOTE — H&P (Addendum)
History and Physical    Kalin Strothman ZOX:[id number] DOB: July 14, 1951 DOA: 10-08-2018  PCP: [facility]  Patient coming from: SNF  I have personally briefly reviewed patient's old medical records in [facility]  Chief Complaint: Hypoxia, SOB  HPI: Derian Bhakta is a 68 y.o. male with medical history significant of DM2, HLD, CAD, CHF on lasix, stroke with L sided weakness.  Patient presents to the ED with c/o SOB, found to have O2 sat of 70% on RA.  Had PNA in Dec.  Of note there was concern for aspiration during that admission (see SLP consult notes).  But patient declined being put on nectar thickened liquids.  No known fevers.  Patient states he has abd pain, feels like he needs to urinate but hasnt been able to urinate for past 2-3 days.  Patient is DNR.   ED Course: Patient with RLL PNA, WBC 24k.  AKF with creat 6.x, BUN 112, K 6.1, bicarb 10, anion gap 21.  Given levaquin, 1L bolus, insulin and glucose.   Review of Systems: As per HPI otherwise 10 point review of systems negative.   Past Medical History:  Diagnosis Date  . Anxiety   . Arthritis   . Asthma   . Coronary artery disease   . Depression   . Diabetes mellitus without complication (HCC)    type 2  . Headache   . Hyperlipidemia   . Hypertension   . Myocardial infarction (HCC)   . Neuromuscular disorder (HCC)   . PAD (peripheral artery disease) (HCC)   . Protein calorie malnutrition (HCC) 04/2017  . Psychotic affective disorder (HCC)    Per sister. Unsure of Dx. Pt sleeps late and difficult to wake. odd affect  . Sacral decubitus ulcer 04/2017  . Stroke [facility])    left side weakness    Past Surgical History:  Procedure Laterality Date  . ABDOMINAL AORTOGRAM W/LOWER EXTREMITY N/A 06/08/2017   Procedure: ABDOMINAL AORTOGRAM W/LOWER EXTREMITY;  Surgeon: Chuck Hint, MD;  Location: [facility] INVASIVE CV LAB;  Service: Cardiovascular;  Laterality: N/A;  . ABOVE KNEE LEG  AMPUTATION Bilateral 01/13/2018  . AMPUTATION Bilateral 01/13/2018   Procedure: BILATERAL ABOVE KNEE AMPUTATION;  Surgeon: Nadara Mustard, MD;  Location: [facility] OR;  Service: Orthopedics;  Laterality: Bilateral;  . BACK SURGERY    . CARDIAC CATHETERIZATION    . CARDIAC SURGERY     CABG x5 2012 - [facility]  . CAROTID-SUBCLAVIAN BYPASS GRAFT Left 07/23/2017   Procedure: BYPASS SUBCLAVIAN ARTERY  USING VIABAHN GORE STENT GRAFT;  Surgeon: Chuck Hint, MD;  Location: [facility] OR;  Service: Vascular;  Laterality: Left;  . LEFT HEART CATH AND CORS/GRAFTS ANGIOGRAPHY N/A 06/26/2017   Procedure: LEFT HEART CATH AND CORS/GRAFTS ANGIOGRAPHY;  Surgeon: Tonny Bollman, MD;  Location: [facility] INVASIVE CV LAB;  Service: Cardiovascular;  Laterality: N/A;     reports that he has been smoking cigarettes. He has been smoking about 1.00 pack per day. He has never used smokeless tobacco. He reports that he does not drink alcohol or use drugs.  No Known Allergies  Family History  Problem Relation Age of Onset  . Hypertension Sister      Prior to Admission medications   Medication Sig Start Date End Date Taking? Authorizing Provider  ALPRAZolam (XANAX) 0.25 MG tablet Take 0.25 mg by mouth at bedtime.   Yes [provider]  Amino Acids-Protein Hydrolys (FEEDING SUPPLEMENT, PRO-STAT SUGAR FREE 64,) LIQD Take 30 mLs by mouth 2 (two)  times daily.   Yes [provider]  aspirin EC 81 MG EC tablet Take 1 tablet (81 mg total) by mouth daily. 04/30/17  Yes Johnson, Clanford L, MD  atorvastatin (LIPITOR) 80 MG tablet Take 80 mg by mouth daily.    Yes [provider]  benzocaine-menthol (CHLORASEPTIC SORE THROAT) 6-10 MG lozenge Take 1 lozenge by mouth every 2 (two) hours as needed for sore throat.   Yes [provider]  docusate sodium (COLACE) 100 MG capsule Take 100 mg by mouth daily as needed for mild constipation.   Yes [provider]  furosemide (LASIX) 20 MG tablet Take 20 mg  by mouth daily.   Yes [provider]  gabapentin (NEURONTIN) 300 MG capsule Take 1 capsule (300 mg total) by mouth 3 (three) times daily. 02/03/18  Yes Barnie Del R, NP  insulin detemir (LEVEMIR) 100 unit/ml SOLN Inject 5 Units into the skin at bedtime.   Yes [provider]  isosorbide mononitrate (IMDUR) 60 MG 24 hr tablet Take 60 mg by mouth daily.   Yes [provider]  lisinopril (PRINIVIL,ZESTRIL) 2.5 MG tablet Take 1 tablet (2.5 mg total) daily by mouth. 07/02/17  Yes Laverda Page B, NP  loperamide (IMODIUM A-D) 2 MG tablet Take 2 mg by mouth as needed for diarrhea or loose stools.   Yes [provider]  megestrol (MEGACE) 20 MG tablet Take 20 mg by mouth daily.   Yes [provider]  metFORMIN (GLUCOPHAGE) 1000 MG tablet Take 1,000 mg by mouth 2 (two) times daily with a meal.   Yes [provider]  metoprolol tartrate (LOPRESSOR) 50 MG tablet Take 0.5 tablets (25 mg total) by mouth 2 (two) times daily. 01/01/18  Yes Osvaldo Shipper, MD  mirtazapine (REMERON) 15 MG tablet Take 15 mg by mouth at bedtime.   Yes [provider]  Multiple Vitamin (MULTIVITAMIN WITH MINERALS) TABS tablet Take 1 tablet by mouth daily. 04/30/17  Yes Johnson, Clanford L, MD  nitroGLYCERIN (NITROSTAT) 0.4 MG SL tablet Place 1 tablet (0.4 mg total) under the tongue every 5 (five) minutes x 3 doses as needed for chest pain. 04/29/17  Yes Johnson, Clanford L, MD  ondansetron (ZOFRAN) 4 MG tablet Take 4 mg by mouth every 6 (six) hours as needed for nausea or vomiting.   Yes [provider]  oxyCODONE-acetaminophen (PERCOCET/ROXICET) 5-325 MG tablet Take 1 tablet by mouth every 4 (four) hours as needed for severe pain. Patient taking differently: Take 1-2 tablets by mouth every 4 (four) hours as needed for severe pain.  01/16/18  Yes Nadara Mustard, MD  sertraline (ZOLOFT) 100 MG tablet Take 200 mg by mouth daily. DEPRESSION/ANXIETY   Yes [provider]  Vitamin D, Ergocalciferol, (DRISDOL) 1.25 MG (50000 UT) CAPS capsule Take 50,000 Units by mouth every Friday.   Yes [provider]    Physical Exam: Vitals:   09/19/2018 0245 10/01/2018 0300 10/09/2018 0315 10/05/2018 0330  BP: 106/77 (!) 132/44 (!) 141/62 (!) 144/65  Pulse: 98     Resp: (!) 24 (!) 24 (!) 31 (!) 24  Temp:      TempSrc:      SpO2: 95%       Constitutional: NAD, calm, comfortable Eyes: PERRL, lids and conjunctivae normal ENMT: Mucous membranes are moist. Posterior pharynx clear of any exudate or lesions.Normal dentition.  Neck: normal, supple, no masses, no thyromegaly Respiratory: Tachypnea, diminished R base Cardiovascular: Regular rate and rhythm, no murmurs /  rubs / gallops. No extremity edema. 2+ pedal pulses. No carotid bruits.  Abdomen: diffuse TTP, no rebound Musculoskeletal: BLE amputations Skin: no rashes, lesions, ulcers. No induration Neurologic: CN 2-12 grossly intact. Sensation intact, DTR normal. Psychiatric: Normal judgment and insight. Alert and oriented x 3. Normal mood.    Labs on Admission: I have personally reviewed following labs and imaging studies  CBC: Recent Labs  Lab 09/24/2018 0122  WBC 24.0*  NEUTROABS 17.1*  HGB 12.4*  HCT 43.4  MCV 92.9  PLT 214   Basic Metabolic Panel: Recent Labs  Lab 09/19/2018 0122  NA 139  K 6.1*  CL 108  CO2 10*  GLUCOSE 102*  BUN 112*  CREATININE 6.65*  CALCIUM 10.4*   GFR: CrCl cannot be calculated (Unknown ideal weight.). Liver Function Tests: No results for input(s): AST, ALT, ALKPHOS, BILITOT, PROT, ALBUMIN in the last 168 hours. No results for input(s): LIPASE, AMYLASE in the last 168 hours. No results for input(s): AMMONIA in the last 168 hours. Coagulation Profile: No results for input(s): INR, PROTIME in the last 168 hours. Cardiac Enzymes: No results for input(s): CKTOTAL, CKMB, CKMBINDEX, TROPONINI in the last 168 hours. BNP (last 3 results) No results for  input(s): PROBNP in the last 8760 hours. HbA1C: No results for input(s): HGBA1C in the last 72 hours. CBG: Recent Labs  Lab 10/12/2018 0255  GLUCAP 91   Lipid Profile: No results for input(s): CHOL, HDL, LDLCALC, TRIG, CHOLHDL, LDLDIRECT in the last 72 hours. Thyroid Function Tests: No results for input(s): TSH, T4TOTAL, FREET4, T3FREE, THYROIDAB in the last 72 hours. Anemia Panel: No results for input(s): VITAMINB12, FOLATE, FERRITIN, TIBC, IRON, RETICCTPCT in the last 72 hours. Urine analysis:    Component Value Date/Time   COLORURINE YELLOW 08/09/2018 1345   APPEARANCEUR CLEAR 08/09/2018 1345   LABSPEC 1.015 08/09/2018 1345   PHURINE 5.5 08/09/2018 1345   GLUCOSEU NEGATIVE 08/09/2018 1345   HGBUR NEGATIVE 08/09/2018 1345   BILIRUBINUR NEGATIVE 08/09/2018 1345   KETONESUR NEGATIVE 08/09/2018 1345   PROTEINUR NEGATIVE 08/09/2018 1345   NITRITE NEGATIVE 08/09/2018 1345   LEUKOCYTESUR NEGATIVE 08/09/2018 1345    Radiological Exams on Admission: Dg Chest 2 View  Result Date: 10/04/2018 CLINICAL DATA:  Cough, congestion, shortness of breath EXAM: CHEST - 2 VIEW COMPARISON:  08/02/2018 FINDINGS: Right lower lobe airspace disease. No pleural effusion or pneumothorax. Stable cardiomediastinal silhouette. Prior CABG. No acute osseous abnormality. IMPRESSION: Right lower lobe pneumonia. Electronically Signed   By: Elige Ko   On: 10/06/2018 01:22    EKG: Independently reviewed.  Assessment/Plan Principal Problem:   HCAP (healthcare-associated pneumonia) Active Problems:   Accelerated hypertension   Sacral decubitus ulcer   Acute hypoxemic respiratory failure (HCC)   Above knee amputation of left lower extremity (HCC)   Above knee amputation of right lower extremity (HCC)   Hyperkalemia   Acute kidney failure (HCC)   Chronic systolic CHF (congestive heart failure) (HCC)    1. HCAP with AKF and acute resp failure - 1. Trend WBC 2. PNA pathway 3. Cefepime /  vanc 4. Suspicious for aspiration PNA given RLL and that he hasnt been on thickened liquids (see also SLP consult from Dec). 1. Will put on thickened liquids for the moment and dys 2 diet as they recommended in Dec 2. SLP re-evaluate and treat in AM to see if he still needs thickened liquid diet 5. Cultures pending 6. Influenza PCR 7. Pulse ox continuous 2. AKF - 1. Pre-renal vs post  renal 2. 1L IVF in ED 3. Will put on bicarb gtt for the moment 4. Strict intake and output 5. Bladder scan >32800ml , abd TTP, and patient says he needs to urinate but cant and hasnt been able to for past 2-3 days 6. Have ordered foley placement stat 7. If abd pain persists after foley placement then will need CT abd/pelvis non-contrast 3. Hyperkalemia - 1. Insulin, D50, and bicarb 2. Check K q4h 3. Bicarb GTT 4. Nephro consult in AM unless large UOP post foley placement here in a moment 5. With that said, family isnt even sure that they would want dialysis. 6. Tele monitor 7. Already has mild hypercalcemia so no calcium given 4. DM2 - sensitive SSI Q4H 5. HTN - 1. Holding home BP meds for the moment 2. If BP elevates, resume the non-nephrotoxic meds first: beta blocker, imdur, etc 6. Sacral decubitus - wound care consult  DVT prophylaxis: Heparin [place] Code Status: DNR Family Communication: Family at bedside Disposition Plan: SNF after admit Consults called: None Admission status: Admit to inpatient  Severity of Illness: The appropriate patient status for this patient is INPATIENT. Inpatient status is judged to be reasonable and necessary in order to provide the required intensity of service to ensure the patient's safety. The patient's presenting symptoms, physical exam findings, and initial radiographic and laboratory data in the context of their chronic comorbidities is felt to place them at high risk for further clinical deterioration. Furthermore, it is not anticipated that the patient will be  medically stable for discharge from the hospital within 2 midnights of admission. The following factors support the patient status of inpatient.   " The patient's presenting symptoms include AMS, cough, abd pain. " The worrisome physical exam findings include new O2 requirement, abd TTP. " The initial radiographic and laboratory data are worrisome because of AKF, hyperkalemia, RLL PNA. " The chronic co-morbidities include BLE amputations, L sided weakness from prior stroke, resides in SNF, CHF.   * I certify that at the point of admission it is my clinical judgment that the patient will require inpatient hospital care spanning beyond 2 midnights from the point of admission due to high intensity of service, high risk for further deterioration and high frequency of surveillance required.Hillary Bow*    Keysean Savino M. DO [facility] Pager [phone] Only works nights!  If 7AM-7PM, please contact the primary day team physician taking care of patient  [email or web address] Password [email or web address]  10/10/2018, 3:54 AM

## 2018-10-17 NOTE — ED Notes (Signed)
Breakfast tray ordered 

## 2018-10-17 NOTE — Progress Notes (Signed)
Death was called at 2155.  The patient's niece and family friend were present at the time along with this RN.  Family has seen chaplain today, declined additional visit.

## 2018-10-17 NOTE — Progress Notes (Signed)
Responded to page to support family at bedside.  Family is withdrawing life support. Provided  Prayer per family, emotional and spiritual support.  Chaplain available as needed.  Venida Jarvis, [place], [facility], Pager [phone]

## 2018-10-17 DEATH — deceased

## 2019-07-22 IMAGING — CT CT HEAD W/O CM
3 series · 14 of 47 positions shown, 16 images · non-contrast
Comparison: CT HEAD December 23, 2016

CLINICAL DATA: RIGHT facial droop. Last seen normal at 8 p.m.
History of hypertension and diabetes.

EXAM:
CT HEAD WITHOUT CONTRAST
TECHNIQUE: Contiguous axial images were obtained from the base of the skull
through the vertex without intravenous contrast.

[Series 3: head 5.0 st · axial · 0.44mm/px · z∈[-113,+42]mm · 8 of 37 slices shown, 10 images]
[im 3/37  brain]
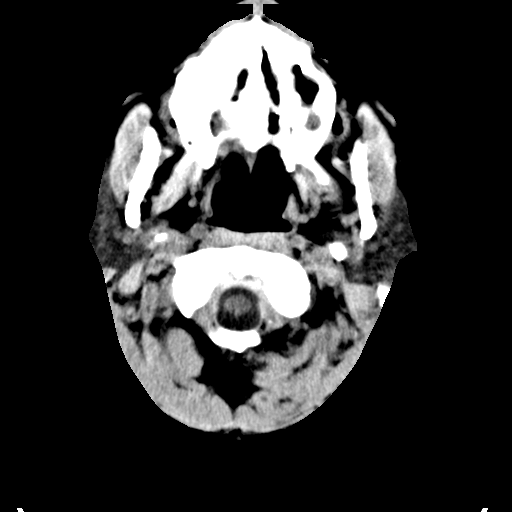
[im 3/37  bone]
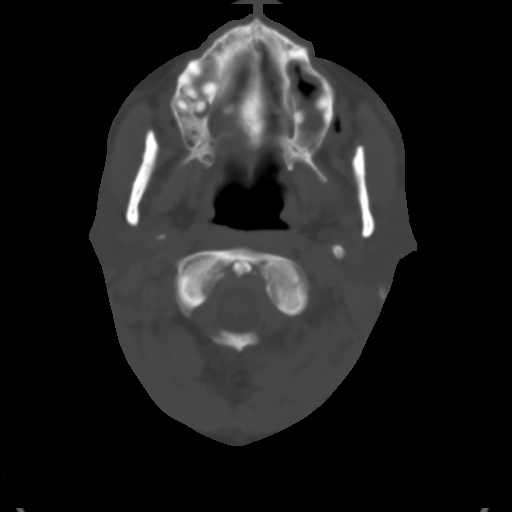
[im 8/37  brain]
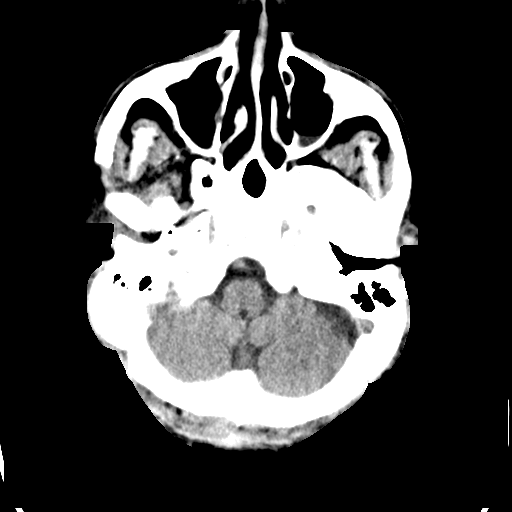
[im 12/37  brain]
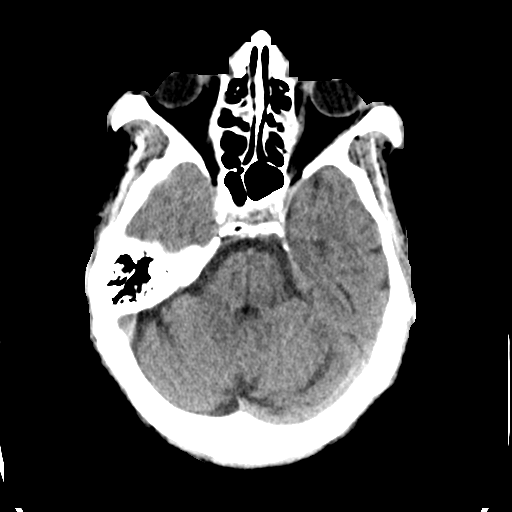
[im 17/37  brain]
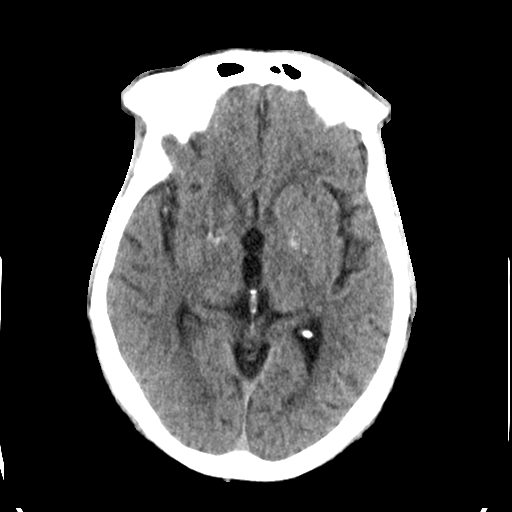
[im 20/37  brain]
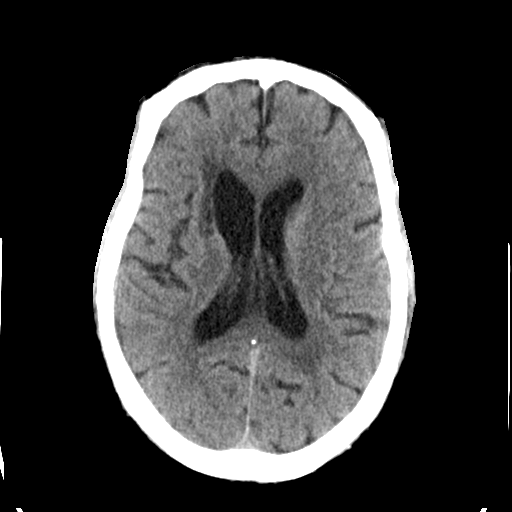
[im 20/37  bone]
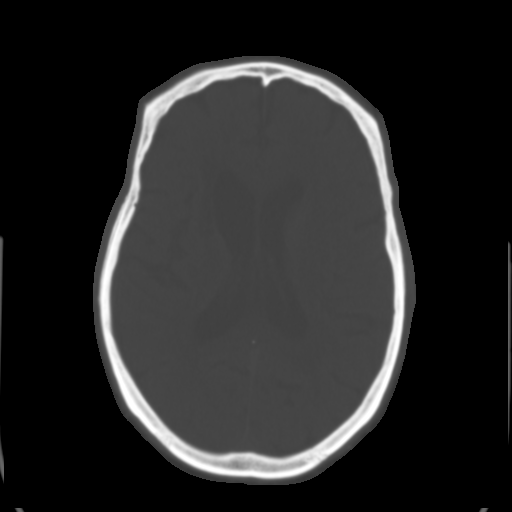
[im 25/37  brain]
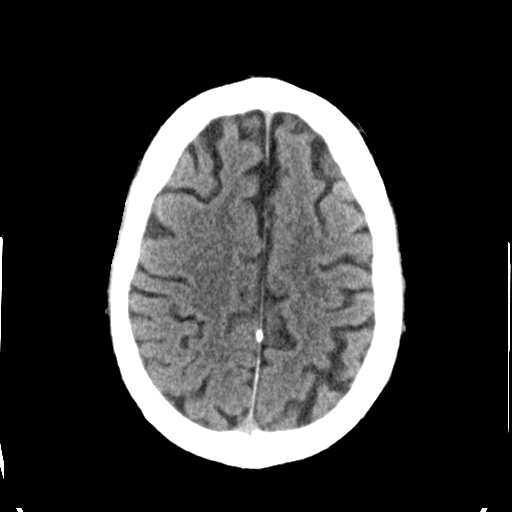
[im 29/37  brain]
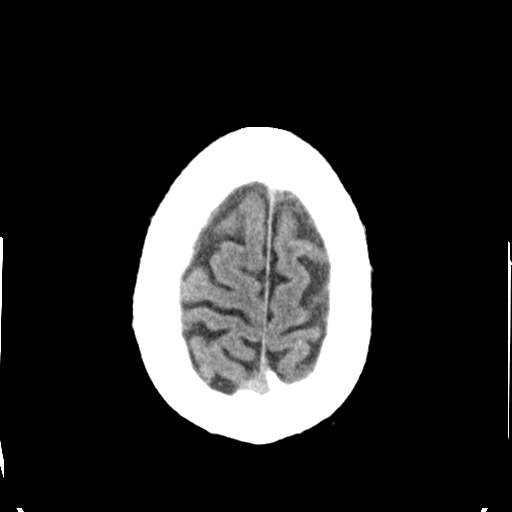
[im 34/37  brain]
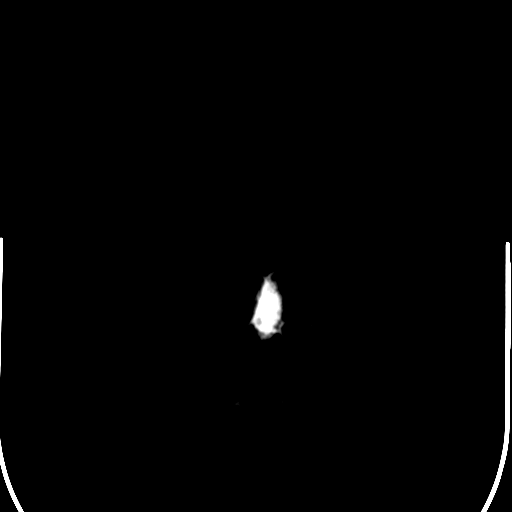

[Series 5: head 3.0 cor st · coronal · 0.39mm/px · 3 of 71 slices shown]
[im 24/71  brain]
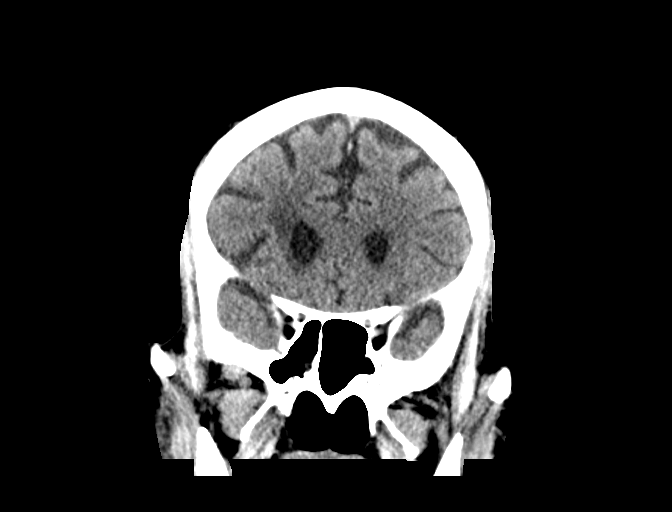
[im 32/71  brain]
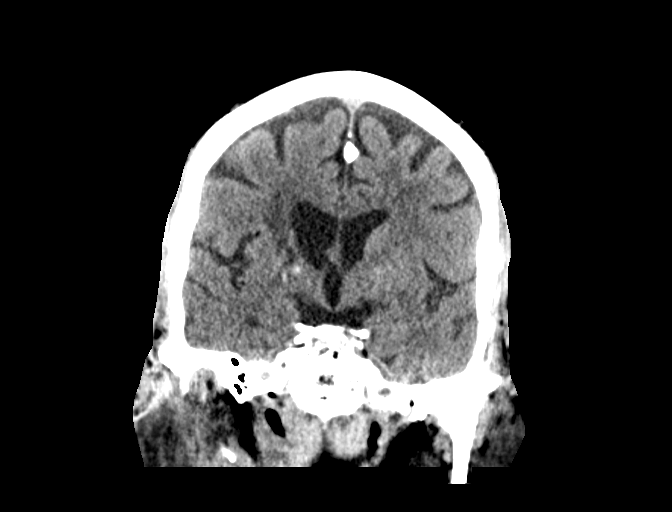
[im 39/71  brain]
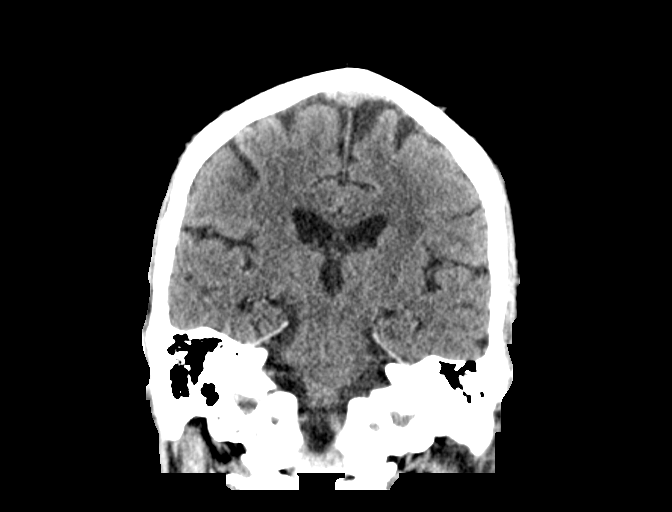

[Series 6: head 3.0 sag st · sagittal · 0.38mm/px · 3 of 67 slices shown]
[im 23/67  brain]
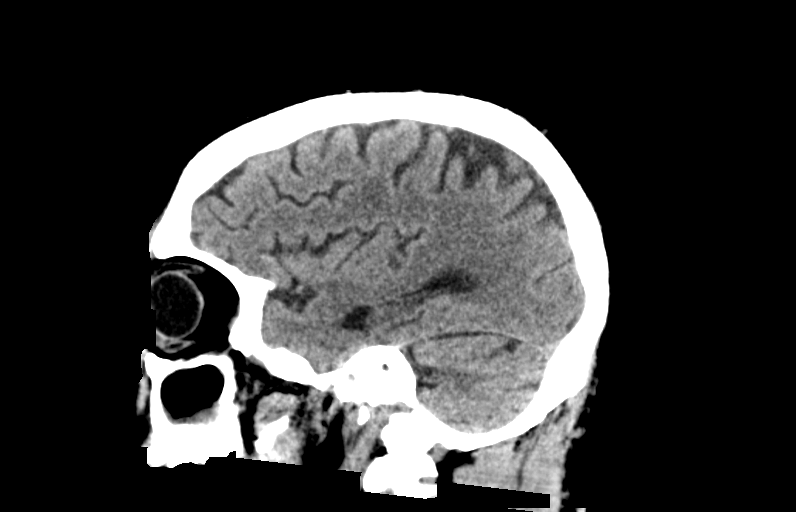
[im 34/67  brain]
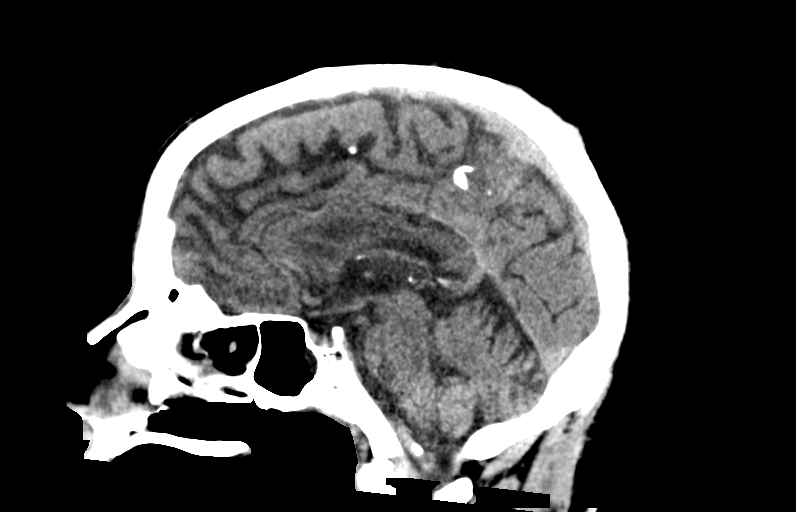
[im 45/67  brain]
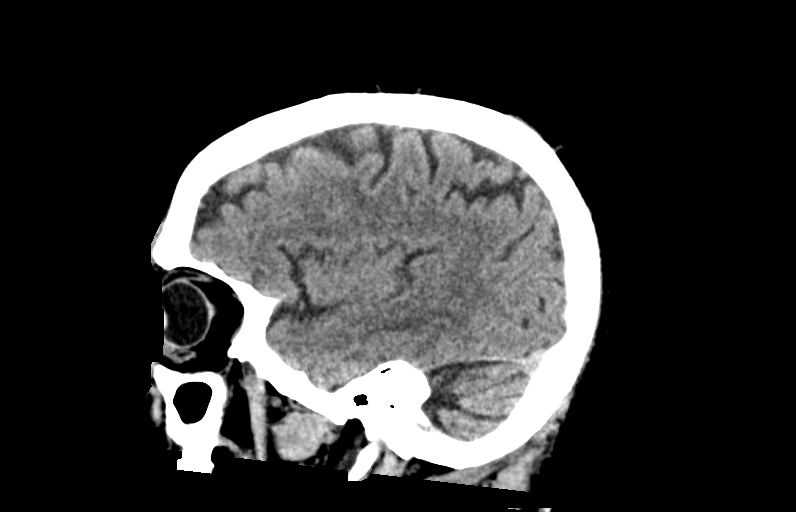

[14 of 47 positions shown; findings below may reference images not displayed]

FINDINGS: BRAIN: No intraparenchymal hemorrhage, mass effect nor midline
shift. Old RIGHT basal ganglia infarct with ex vacuo dilatation
subjacent ventricle. Mild parenchymal brain volume loss, advanced
for age. No hydrocephalus. Patchy supratentorial white matter
hypodensities including LEFT frontal subcortical white matter. No
abnormal extra-axial fluid collections. Basal cisterns are patent.

VASCULAR: Moderate calcific atherosclerosis of the carotid siphons.

SKULL: No skull fracture. No significant scalp soft tissue swelling.

SINUSES/ORBITS: Moderate pan paranasal sinusitis. Mastoid air cells
are well aerated.The included ocular globes and orbital contents are
non-suspicious.

OTHER: Poor dentition.

ASPECTS (Alberta Stroke Program Early CT Score)

- Ganglionic level infarction (caudate, lentiform nuclei, internal
capsule, insula, M1-M3 cortex): 7

- Supraganglionic infarction (M4-M6 cortex): 3

Total score (0-10 with 10 being normal): 10
IMPRESSION: 1. No acute intracranial process.
2. Mild to moderate chronic small vessel ischemic disease, worse
within LEFT frontal lobe.
3. Old RIGHT basal ganglia infarct.
4. Moderate parenchymal brain volume loss for age.
5. ASPECTS is 10.
6. Critical Value/emergent results were called by telephone at the
time of interpretation on 04/26/2017 at [DATE] to Dr. Yongtak,
Neurology , who verbally acknowledged these results.
